# Patient Record
Sex: Female | Born: 1935 | Race: Black or African American | Hispanic: No | Smoking: Former smoker
Health system: Southern US, Community
[De-identification: ages and names within clinical notes are randomized; demographics above are authoritative.]

## PROBLEM LIST (undated history)

## (undated) DIAGNOSIS — I259 Chronic ischemic heart disease, unspecified: Secondary | ICD-10-CM

## (undated) DIAGNOSIS — C50919 Malignant neoplasm of unspecified site of unspecified female breast: Secondary | ICD-10-CM

## (undated) DIAGNOSIS — Z8639 Personal history of other endocrine, nutritional and metabolic disease: Secondary | ICD-10-CM

## (undated) DIAGNOSIS — I69328 Other speech and language deficits following cerebral infarction: Secondary | ICD-10-CM

## (undated) DIAGNOSIS — I2510 Atherosclerotic heart disease of native coronary artery without angina pectoris: Secondary | ICD-10-CM

## (undated) DIAGNOSIS — I10 Essential (primary) hypertension: Secondary | ICD-10-CM

## (undated) DIAGNOSIS — I219 Acute myocardial infarction, unspecified: Secondary | ICD-10-CM

## (undated) DIAGNOSIS — D649 Anemia, unspecified: Secondary | ICD-10-CM

## (undated) DIAGNOSIS — E039 Hypothyroidism, unspecified: Secondary | ICD-10-CM

## (undated) DIAGNOSIS — I639 Cerebral infarction, unspecified: Secondary | ICD-10-CM

## (undated) DIAGNOSIS — Z951 Presence of aortocoronary bypass graft: Secondary | ICD-10-CM

## (undated) DIAGNOSIS — M1990 Unspecified osteoarthritis, unspecified site: Secondary | ICD-10-CM

## (undated) DIAGNOSIS — E785 Hyperlipidemia, unspecified: Secondary | ICD-10-CM

## (undated) HISTORY — DX: Hyperlipidemia, unspecified: E78.5

## (undated) HISTORY — DX: Chronic ischemic heart disease, unspecified: I25.9

## (undated) HISTORY — PX: BREAST SURGERY: SHX581

## (undated) HISTORY — DX: Essential (primary) hypertension: I10

## (undated) HISTORY — DX: Unspecified osteoarthritis, unspecified site: M19.90

## (undated) HISTORY — DX: Malignant neoplasm of unspecified site of unspecified female breast: C50.919

## (undated) HISTORY — DX: Anemia, unspecified: D64.9

## (undated) HISTORY — DX: Hypothyroidism, unspecified: E03.9

## (undated) HISTORY — PX: TUBAL LIGATION: SHX77

## (undated) HISTORY — DX: Presence of aortocoronary bypass graft: Z95.1

---

## 1973-06-02 DIAGNOSIS — C50919 Malignant neoplasm of unspecified site of unspecified female breast: Secondary | ICD-10-CM

## 1973-06-02 HISTORY — PX: MASTECTOMY: SHX3

## 1973-06-02 HISTORY — DX: Malignant neoplasm of unspecified site of unspecified female breast: C50.919

## 1997-10-02 ENCOUNTER — Ambulatory Visit (HOSPITAL_COMMUNITY): Admission: RE | Admit: 1997-10-02 | Discharge: 1997-10-02 | Payer: Self-pay | Admitting: Family Medicine

## 1997-12-19 ENCOUNTER — Encounter: Admission: RE | Admit: 1997-12-19 | Discharge: 1998-03-19 | Payer: Self-pay | Admitting: Family Medicine

## 1999-06-20 ENCOUNTER — Encounter: Payer: Self-pay | Admitting: Family Medicine

## 1999-06-20 ENCOUNTER — Encounter: Admission: RE | Admit: 1999-06-20 | Discharge: 1999-06-20 | Payer: Self-pay | Admitting: Family Medicine

## 2000-12-31 HISTORY — PX: CORONARY ARTERY BYPASS GRAFT: SHX141

## 2001-01-05 ENCOUNTER — Encounter: Payer: Self-pay | Admitting: Family Medicine

## 2001-01-05 ENCOUNTER — Encounter: Admission: RE | Admit: 2001-01-05 | Discharge: 2001-01-05 | Payer: Self-pay | Admitting: Family Medicine

## 2001-01-19 ENCOUNTER — Inpatient Hospital Stay (HOSPITAL_COMMUNITY): Admission: EM | Admit: 2001-01-19 | Discharge: 2001-01-28 | Payer: Self-pay | Admitting: *Deleted

## 2001-01-20 HISTORY — PX: CARDIAC CATHETERIZATION: SHX172

## 2001-01-22 ENCOUNTER — Encounter: Payer: Self-pay | Admitting: Thoracic Surgery (Cardiothoracic Vascular Surgery)

## 2001-01-23 ENCOUNTER — Encounter: Payer: Self-pay | Admitting: Thoracic Surgery (Cardiothoracic Vascular Surgery)

## 2001-01-24 ENCOUNTER — Encounter: Payer: Self-pay | Admitting: Thoracic Surgery (Cardiothoracic Vascular Surgery)

## 2001-01-25 ENCOUNTER — Encounter: Payer: Self-pay | Admitting: Cardiothoracic Surgery

## 2001-01-31 ENCOUNTER — Emergency Department (HOSPITAL_COMMUNITY): Admission: EM | Admit: 2001-01-31 | Discharge: 2001-01-31 | Payer: Self-pay | Admitting: Emergency Medicine

## 2001-01-31 ENCOUNTER — Encounter: Payer: Self-pay | Admitting: Emergency Medicine

## 2001-03-13 ENCOUNTER — Emergency Department (HOSPITAL_COMMUNITY): Admission: EM | Admit: 2001-03-13 | Discharge: 2001-03-13 | Payer: Self-pay | Admitting: *Deleted

## 2001-03-16 ENCOUNTER — Encounter (HOSPITAL_COMMUNITY): Admission: RE | Admit: 2001-03-16 | Discharge: 2001-06-14 | Payer: Self-pay | Admitting: Cardiology

## 2001-06-15 ENCOUNTER — Encounter (HOSPITAL_COMMUNITY): Admission: RE | Admit: 2001-06-15 | Discharge: 2001-09-13 | Payer: Self-pay | Admitting: Cardiology

## 2001-08-18 ENCOUNTER — Encounter (HOSPITAL_BASED_OUTPATIENT_CLINIC_OR_DEPARTMENT_OTHER): Payer: Self-pay | Admitting: General Surgery

## 2001-08-18 ENCOUNTER — Ambulatory Visit (HOSPITAL_COMMUNITY): Admission: RE | Admit: 2001-08-18 | Discharge: 2001-08-18 | Payer: Self-pay | Admitting: General Surgery

## 2001-08-26 ENCOUNTER — Encounter (INDEPENDENT_AMBULATORY_CARE_PROVIDER_SITE_OTHER): Payer: Self-pay | Admitting: Specialist

## 2001-08-26 ENCOUNTER — Encounter (HOSPITAL_BASED_OUTPATIENT_CLINIC_OR_DEPARTMENT_OTHER): Payer: Self-pay | Admitting: General Surgery

## 2001-08-26 ENCOUNTER — Ambulatory Visit (HOSPITAL_COMMUNITY): Admission: RE | Admit: 2001-08-26 | Discharge: 2001-08-26 | Payer: Self-pay | Admitting: General Surgery

## 2002-05-23 ENCOUNTER — Encounter: Payer: Self-pay | Admitting: Family Medicine

## 2002-05-23 ENCOUNTER — Encounter: Admission: RE | Admit: 2002-05-23 | Discharge: 2002-05-23 | Payer: Self-pay | Admitting: Family Medicine

## 2002-08-22 ENCOUNTER — Ambulatory Visit (HOSPITAL_COMMUNITY): Admission: RE | Admit: 2002-08-22 | Discharge: 2002-08-22 | Payer: Self-pay | Admitting: Internal Medicine

## 2002-08-22 ENCOUNTER — Encounter (INDEPENDENT_AMBULATORY_CARE_PROVIDER_SITE_OTHER): Payer: Self-pay

## 2002-08-22 ENCOUNTER — Encounter (INDEPENDENT_AMBULATORY_CARE_PROVIDER_SITE_OTHER): Payer: Self-pay | Admitting: Specialist

## 2002-08-22 ENCOUNTER — Encounter: Payer: Self-pay | Admitting: Internal Medicine

## 2002-12-13 ENCOUNTER — Ambulatory Visit (HOSPITAL_COMMUNITY): Admission: RE | Admit: 2002-12-13 | Discharge: 2002-12-13 | Payer: Self-pay | Admitting: Otolaryngology

## 2002-12-13 ENCOUNTER — Encounter: Payer: Self-pay | Admitting: Otolaryngology

## 2003-09-16 ENCOUNTER — Emergency Department (HOSPITAL_COMMUNITY): Admission: EM | Admit: 2003-09-16 | Discharge: 2003-09-16 | Payer: Self-pay | Admitting: Emergency Medicine

## 2005-06-03 ENCOUNTER — Ambulatory Visit (HOSPITAL_COMMUNITY): Admission: RE | Admit: 2005-06-03 | Discharge: 2005-06-03 | Payer: Self-pay | Admitting: Internal Medicine

## 2005-07-18 ENCOUNTER — Emergency Department (HOSPITAL_COMMUNITY): Admission: EM | Admit: 2005-07-18 | Discharge: 2005-07-18 | Payer: Self-pay | Admitting: Emergency Medicine

## 2006-01-01 ENCOUNTER — Emergency Department (HOSPITAL_COMMUNITY): Admission: EM | Admit: 2006-01-01 | Discharge: 2006-01-01 | Payer: Self-pay | Admitting: Emergency Medicine

## 2006-01-15 ENCOUNTER — Encounter: Admission: RE | Admit: 2006-01-15 | Discharge: 2006-01-29 | Payer: Self-pay | Admitting: Family Medicine

## 2008-12-31 HISTORY — PX: CARDIOVASCULAR STRESS TEST: SHX262

## 2009-04-03 ENCOUNTER — Encounter: Admission: RE | Admit: 2009-04-03 | Discharge: 2009-04-03 | Payer: Self-pay | Admitting: Family Medicine

## 2009-11-17 ENCOUNTER — Emergency Department (HOSPITAL_COMMUNITY)
Admission: EM | Admit: 2009-11-17 | Discharge: 2009-11-18 | Payer: Self-pay | Source: Home / Self Care | Admitting: Emergency Medicine

## 2010-05-28 ENCOUNTER — Ambulatory Visit: Payer: Self-pay | Admitting: Cardiology

## 2010-06-23 ENCOUNTER — Emergency Department (HOSPITAL_COMMUNITY)
Admission: EM | Admit: 2010-06-23 | Discharge: 2010-06-23 | Payer: Self-pay | Source: Home / Self Care | Admitting: Emergency Medicine

## 2010-06-25 LAB — DIFFERENTIAL
Basophils Relative: 0 % (ref 0–1)
Eosinophils Relative: 4 % (ref 0–5)
Neutro Abs: 2.4 10*3/uL (ref 1.7–7.7)
Neutrophils Relative %: 63 % (ref 43–77)

## 2010-06-25 LAB — CBC
HCT: 32 % — ABNORMAL LOW (ref 36.0–46.0)
Hemoglobin: 10.3 g/dL — ABNORMAL LOW (ref 12.0–15.0)
MCH: 27.2 pg (ref 26.0–34.0)
WBC: 3.8 10*3/uL — ABNORMAL LOW (ref 4.0–10.5)

## 2010-06-25 LAB — COMPREHENSIVE METABOLIC PANEL
ALT: 14 U/L (ref 0–35)
AST: 20 U/L (ref 0–37)
CO2: 27 mEq/L (ref 19–32)
Calcium: 9.4 mg/dL (ref 8.4–10.5)
Chloride: 108 mEq/L (ref 96–112)
GFR calc Af Amer: 59 mL/min — ABNORMAL LOW (ref >60)
GFR calc non Af Amer: 48 mL/min — ABNORMAL LOW (ref >60)
Glucose, Bld: 94 mg/dL (ref 70–99)
Potassium: 3.8 mEq/L (ref 3.5–5.1)
Sodium: 140 mEq/L (ref 135–145)
Total Protein: 6.6 g/dL (ref 6.0–8.3)

## 2010-06-25 LAB — POCT CARDIAC MARKERS: Troponin i, poc: <0.05 ng/mL (ref 0.00–0.09)

## 2010-07-01 NOTE — Consult Note (Signed)
NAMEKENNDRA, MORRIS                  ACCOUNT NO.:  [id number]  MEDICAL RECORD NO.:  [id number]           PATIENT TYPE:  LOCATION:                                 FACILITY:  PHYSICIAN:  Michele Kerlin C. Melondy Blanchard, MD, FACCDATE OF BIRTH:  24-Jun-1924  DATE OF CONSULTATION: DATE OF DISCHARGE:                                CONSULTATION   I was asked by Dr. Doug Sou of the emergency department to evaluate Ms. Josephine Igo for chest discomfort.  HISTORY OF PRESENT ILLNESS:  Ms. Kamaka is a delightful 77 year old widowed African American female who comes in today after having some sharp knife- like pains in her subxiphoid area radiating under her left breast.  It lasted for a few seconds.  She was just sitting in the chair.  It subsequently returned and lasted again for a few minutes.  It continued to happen to her off and on and she called 911.  They gave her sublingual nitroglycerin and aspirin.  By the time she arrived to the ED, the pain had resolved.  She is currently having off and on discomfort as well.  She has known coronary artery disease and initially presented in 2002 with sudden onset of shortness of breath.  She did not have angina at that time.  She did have severe three-vessel disease and underwent coronary artery bypass grafting by Dr. Dorris Fetch in August 2002 with 5 grafts.  She had good LV function.  She has done well ever since.  She is followed by Dr. Roger Shelter.  She had a recent checkup with Dr. Deborah Chalk who felt that she was doing well.  I do not get the impression that she had a recent stress test.  The discomfort above was not associated with any nausea, vomiting, shortness of breath, or dyspnea.  She has had some burping, but no significant reflux symptoms.  Her bowels have been moving regularly. She has had no melena or hematochezia.  In the emergency department, she was found to be stable.  Her EKG both in the hospital as well as here in the emergency  department showed T- wave inversion in V1 and V2 which are old.  Her ST segments are not remarkable, in fact it looked little bit better than her EKG did in 2007.  Chest x-ray shows her to be status post coronary artery bypass grafting. There was no sign of any infection or consolidations.  She does have some cardiomegaly.  Laboratory data showed point-of-care markers to be negative.  Her hemoglobin is 10.3, white count 3.8, and platelets 138,000.  Metabolic panel was remarkable for BUN of 25, otherwise unremarkable.  Her past medical history significant for intolerance to VALIUM, CODEINE, and OXYCODONE.  She has a history of hypertension, breast cancer, osteoarthritis, and history of hypokalemia.  FAMILY HISTORY:  Significant for Alzheimer's and no premature coronary artery disease.  SOCIAL HISTORY:  She lives in [place].  She has 4 children, all of which have passed away.  She is a widow.  She quit smoking about 25 years ago.  She is retired from the school system.  REVIEW OF  SYSTEMS:  Other than the history of present illness, she has a history of chronic belching.  PHYSICAL EXAMINATION IN THE EMERGENCY ROOM:  GENERAL:  She is a delightful lady, in no acute distress. VITAL SIGNS:  Her blood pressure is 120/80.  Pulse is 70 and regular. She is in sinus rhythm.  Sat was 98%.  Respiratory rate is 18. HEENT:  Remarkable for glasses and dentures.  Carotid upstrokes are equal bilaterally without bruits. NECK:  No JVD.  Supple.  Thyroid is not enlarged. CHEST:  No chest Ashkan Chamberland tenderness.  There is no epigastric or left to right upper quadrant tenderness. CARDIAC:  Soft S1 and S2.  Regular rate and rhythm.  Normal S1 and S2. No rub. LUNGS:  Clear. ABDOMEN:  Soft, otherwise good bowel sounds. EXTREMITIES:  No cyanosis, clubbing, or edema.  There is no sign of DVT. Pulses are intact. NEURO:  Intact. SKIN:  Unremarkable.  ASSESSMENT:  Noncardiac chest pain.  The patient  thinks that there may be some arthritic pain and she may be right.  I told her to take p.r.n. Aleve which she does at home.  No other changes were made.  She was discharged to the emergency room.     Shyonna Carlin C. Daleen Squibb, MD, [facility]     TCW/MEDQ  D:  06/23/2010  T:  06/24/2010  Job:  [id number]  Electronically Signed by Valera Castle MD [facility] on 07/01/2010 11:00:44 AM

## 2010-08-18 LAB — DIFFERENTIAL
Eosinophils Absolute: 0 10*3/uL (ref 0.0–0.7)
Eosinophils Relative: 0 % (ref 0–5)
Lymphs Abs: 0.7 10*3/uL (ref 0.7–4.0)
Monocytes Absolute: 0.3 10*3/uL (ref 0.1–1.0)
Neutro Abs: 9.6 10*3/uL — ABNORMAL HIGH (ref 1.7–7.7)
Neutrophils Relative %: 90 % — ABNORMAL HIGH (ref 43–77)

## 2010-08-18 LAB — CBC
HCT: 33.9 % — ABNORMAL LOW (ref 36.0–46.0)
MCHC: 33.9 g/dL (ref 30.0–36.0)
MCV: 84.1 fL (ref 78.0–100.0)
Platelets: 156 10*3/uL (ref 150–400)
RDW: 14.4 % (ref 11.5–15.5)
WBC: 10.6 10*3/uL — ABNORMAL HIGH (ref 4.0–10.5)

## 2010-08-18 LAB — COMPREHENSIVE METABOLIC PANEL
Albumin: 4 g/dL (ref 3.5–5.2)
CO2: 27 mEq/L (ref 19–32)
GFR calc Af Amer: 53 mL/min — ABNORMAL LOW (ref >60)
Glucose, Bld: 109 mg/dL — ABNORMAL HIGH (ref 70–99)
Sodium: 135 mEq/L (ref 135–145)

## 2010-10-03 ENCOUNTER — Telehealth: Payer: Self-pay | Admitting: Cardiology

## 2010-10-03 NOTE — Telephone Encounter (Signed)
Returning your phone call about the new doctor referral. Is a  Lawyer and will be home after two tomorrow. Please call back.

## 2010-10-03 NOTE — Telephone Encounter (Signed)
RN set pt up to see Norma Fredrickson NP on 11/19/10 with labs.

## 2010-10-18 NOTE — Consult Note (Signed)
[place]. [facility]  Patient:    Natalie Bullock, Natalie Bullock Visit Number: [id number] MRN: [id number]          Service Type: MED Location: [phone] Attending Physician:  Eleanora Neighbor Adm. Date:  01/19/2001   CC:         Colleen Can. Deborah Chalk, M.D.  Charlynne Pander Bruna Potter, M.D.   Consultation Report  REASON FOR CONSULTATION:  Three-vessel coronary disease.  CHIEF COMPLAINT:  Shortness of breath.  HISTORY OF PRESENT ILLNESS:  Natalie Bullock is a 77 year old, African-American female with a history of hypertension but no prior cardiac disease. Over the past couple of months, she has been experiencing dyspnea which is not necessarily related to exertion. She would be short of breath. She says that these symptoms occasionally improved when she was doing her rehab from her radical mastectomy. On the morning of January 19, 2001, she presented to the emergency room after being awakened at 4 a.m. with severe shortness of breath. She did not experience any indigestion, chest pain, pressure, or tightness. She was evaluated in the emergency room and had elevated cardiac enzymes. Her peak CK was 183 and peak MB was 13. Her troponin was 1.42. She was admitted, treated with heparin and nitroglycerin, and underwent cardiac catheterization. At catheterization, she had normal left ventricular function. She had severe, diffusely diseased right coronary with 60% to 70% stenosis, as well as an ostial 70% posterior descending stenosis. She had a 99% LAD lesion with aneurysmal dilatation. A small first diagonal branch had a 90% stenosis. She also had a 99% stenosis in the second OM. Her circumflex system was small with multiple, small, tortuous obtuse marginal branches. She now is referred for consideration for coronary artery bypass grafting.  PAST MEDICAL HISTORY/PAST SURGICAL HISTORY:  Significant for hypertension, osteoarthritis, history of breast cancer, status post right mastectomy in  1975 with no recurrence, history of sinusitis, and history of hyperkalemia. She denies previous cardiac disease, diabetes, or stroke.  MEDICATIONS AT THE TIME OF ADMISSION: 1. Enteric-coated aspirin 81 mg p.o. q.d. 2. Maxzide 25 mg p.o. q.d. 3. Ibuprofen p.r.n.  ALLERGIES:  She is allergic to VALIUM and TYLENOL.  FAMILY HISTORY:  Significant for her grandfather who had a myocardial infarction, as well as an uncle on her mothers side who had a myocardial infarction.  SOCIAL HISTORY:  She is widowed. She lives alone. She has multiple family members in the area. She has a remote history of tobacco use, 10-pack years; she quit 20 years ago. She does not use ethanol.  REVIEW OF SYSTEMS:  She has shortness of breath as mentioned which is occasionally exertional and occasionally at rest. She does not have any chest pain. She has had frequent bloating and belching but no real indigestion pain. She has had no other change in her bowel habits. She has not had any change in her bladder habits. She has no history of DVT or PE. She does get some swelling in her left ankle from time to time ever since having a venous cutdown years ago. She does have osteoarthritis pain, particularly in her left knee. Her review of systems is otherwise negative.  PHYSICAL EXAMINATION:  GENERAL:  Natalie Bullock is an obese, 77 year old African-American female in no acute distress. She is well-developed and well-nourished.  VITAL SIGNS:  Blood pressure is 124/65, pulse is 88, respirations are 18, her temperature is 97.4, and her oxygen saturation is 98% on room air.  NEUROLOGICAL:  She is alert and  oriented x 3 and grossly intact.  HEENT:  Unremarkable.  NECK:  Supple without thyromegaly, adenopathy, or bruits.  LUNGS:  Clear to auscultation and percussion bilaterally.  CHEST:  Remarkable for multiple surgical scars from a previous radical mastectomy. All well healed with no evidence of recurrence.  CARDIAC:   Regular rate and rhythm. Normal S1 and S2. There are no rubs or murmurs.  ABDOMEN:  Soft and nontender.  EXTREMITIES:  There is trace edema in the left lower extremity. There is a well-healed surgical scar at the left ankle. She has palpable pulses that are 2+ throughout.  SKIN:  Intact with no lesions other than previously mentioned surgical scars.  LABORATORY DATA:  Her white blood cell count is 6.8, hematocrit 32, platelets 170. PT 12.9, PTT 62 on heparin. Her sodium 138, potassium 4.1, BUN and creatinine 8 and 0.9, glucose is 124.  Cardiac catheterization as noted in the HPI.  Her EKG shows normal sinus rhythm with left axis deviation.  Chest x-ray shows COPD and cardiomegaly and no acute or active disease.  IMPRESSION AND PLAN:  Natalie Bullock is a 77 year old, African-American female who presented with acute shortness of breath. She ruled in for a non-Q-wave myocardial infarction; and at cardiac catheterization, has severe three-vessel disease. Coronary artery bypass grafting is indicated for relief of symptoms and survival benefit. The patient has good targets in the right system, as well as the LAD. She has a small diagonal that does appear to be graftable. She has a very small circumflex system. It is unclear if that will be graftable or not from the appearance of the cardiac catheterization. In any event, she would definitely benefit from grafting of the LAD and right coronary systems.  I have discussed with Natalie Bullock the indications, risks, benefits, and alternative treatments. She understands that the risks include but are limited to death, stroke, MI, bleeding, possible need for blood transfusion, infection, DVT, PE, other organ system dysfunction including respiratory, renal, hepatic, and GI. She currently is symptom free in stable condition on a  heparin drip and nitroglycerin paste. She will be on the OR schedule for Friday as the first case. All questions were  answered. Attending Physician:  Eleanora Neighbor DD:  01/20/01 TD:  01/21/01 Job: [id number]

## 2010-10-18 NOTE — Op Note (Signed)
[place]. [facility]  Patient:    Natalie Bullock, Natalie Bullock Visit Number: [id number] MRN: [id number]          Service Type: MED Location: 2000 2006 01 Attending Physician:  Charlett Lango Proc. Date: 01/22/01 Adm. Date:  01/19/2001   CC:         Colleen Can. Deborah Chalk, M.D.  Charlynne Pander Bruna Potter, M.D.   Operative Report  PREOPERATIVE DIAGNOSIS:  Severe three-vessel disease, status post non-Q-wave myocardial infarction.  POSTOPERATIVE DIAGNOSIS:  Severe three-vessel disease, status post non-Q-wave myocardial infarction.  PROCEDURES:  Median sternotomy, extracorporeal circulation, coronary artery bypass grafting x 5 (left internal mammary artery to the left anterior descending coronary artery, saphenous vein graft to first diagonal, saphenous vein graft to ramus intermedius, sequential saphenous vein graft to posterior descending and posterolateral).  SURGEON:  Salvatore Decent. Dorris Fetch, M.D.  ASSISTANTMaxwell Marion, R.N.F.A.  ANESTHESIA:  General.  FINDINGS:  Severe three-vessel coronary disease with heavily calcified proximal coronaries.  LAD, posterior descending, and posterolateral good quality; first diagonal small, fair quality; ramus intermedius large, fair quality, diffusely diseased.  Left internal mammary artery good quality, saphenous vein fair.  CLINICAL NOTE:  Natalie Bullock is a 77 year old female with no prior history of coronary disease.  She presented with acute shortness of breath which awakened her from her sleep.  Her cardiac enzymes were positive for a non-Q-wave MI. She underwent cardiac catheterization, which revealed severe three-vessel disease and good left ventricular function.  She had heavily calcified proximal coronaries, not amenable to percutaneous intervention; therefore, she was referred for coronary artery bypass grafting.  The indications, risks, benefits, and alternative procedures were discussed in detail with the patient.  She  understood and accepted the risks and agreed to proceed.  DESCRIPTION OF PROCEDURE:  Natalie Bullock was brought to the preop holding area on January 22, 2001.  Lines were placed to monitor arterial, central venous, and pulmonary arterial pressure.  EKG leads were placed for continuous telemetry. The patient was taken to the operating room, anesthetized and intubated.  A Foley catheter was placed, intravenous antibiotics were administered.  The chest, abdomen, and legs were prepped and draped in the usual fashion.  A median sternotomy was performed.  Simultaneously an incision was made in the medial aspect of the right leg, and the greater saphenous vein was harvested using standard technique.  Saphenous vein was of fair quality.  The mammary artery was of good quality.  It was harvested using standard techniques.  The patient was fully heparinized prior to dividing the distal end of the mammary artery.  There was good flow through the cut end of the vessel.  The mammary was placed in a papaverine-soaked sponge and placed into the left pleural space.  The pericardium was opened, and the ascending aorta was inspected and palpated.  There was no palpable atherosclerotic disease.  The aorta was cannulated via concentric 2-0 Ethibond pledgeted pursestring sutures.  A dual-stage venous cannula was placed via pursestring suture in the right atrial appendage.  Cardiopulmonary bypass was instituted, and the patient was cooled to 32 degrees Celsius.  The coronary arteries were inspected, and anastomotic sites were chosen.  The conduits were inspected and cut to length. A foam pad was placed in the pericardium to protect the left phrenic nerve, a temperature probe was placed in the myocardial septum, and a cardioplegia cannula was placed in the ascending aorta.  The aorta was crossclamped, and the left ventricle was emptied via the aortic root  vent.  Cardiac arrest then was achieved with a combination of  cold antegrade blood cardioplegia and topical iced saline.  After achieving a complete diastolic arrest and a myocardial septal temperature of 12 degrees Celsius, the following distal anastomoses were performed.  First a reversed saphenous vein graft was placed sequentially to the posterior descending and posterolateral branches of the right coronary.  The right coronary itself was heavily diseased and heavily calcified.  There also was a tight stenosis of the proximal aspect of the posterior descending.  Both target vessels were 2 mm diameter, good-quality vessels.  The vein graft was of fair quality.  A side-to-side anastomosis was performed about the side branch to the posterior descending and end-to-side to the posterolateral. Both anastomoses were performed with running 7-0 Prolene sutures.  There was excellent flow through the graft.  Cardioplegia was administered.  A good flash was seen distally in both vessels.  There was good hemostasis of both anastomoses.  Next a reversed saphenous vein graft was placed end-to-side to the ramus intermedius.  This was a large, 2 mm vessel but was heavily diseased.  A 1.5 mm probe passed a short distance in both directions.  The vessel was grafted in its midportion at essentially the only location where there was not significant plaque.  The anastomosis was performed with a running 7-0 Prolene suture.  At the completion of the anastomosis, cardioplegia was administered. There was good flow and good hemostasis.  Next a reversed saphenous vein graft was placed to the first diagonal branch of the LAD.  This was a small vessel.  It was about 1.2 mm in diameter.  It was of fair quality.  It had a 95% proximal stenosis.  The anastomosis was performed with a running 7-0 Prolene suture in an end-to-side fashion.  There was good flow through this graft.  Cardioplegia was administered, and again there was good hemostasis.   Next, the left internal  mammary artery was brought through a window in the pericardium.  The distal end was spatulated and was anastomosed end-to-side to the distal LAD.  The LAD was a 1.5 mm good-quality target.  The mammary was a 2 mm good-quality conduit.  At the completion of the mammary to LAD anastomosis, rewarming was begun.  The bulldog clamp was removed from the mammary artery.  Immediate and rapid septal rewarming was noted.  Lidocaine was administered.  The mammary pedicle was tacked to the epicardial surface of the heart with 6-0 Prolene sutures.  The aortic crossclamp was removed.  The total crossclamp time was 54 minutes.  A partial occlusion clamp was placed on the ascending aorta.  The cardioplegia cannula was removed.  The proximal vein graft anastomoses were performed to 4.0 mm punch aortotomies with running 6-0 Prolene sutures.  At the completion of the final proximal anastomosis, the patient was placed in Trendelenburg position, air was allowed to vent as the partial clamp was removed.  Air was then aspirated from the vein grafts as the bulldog clamps were removed and flow was restored through all grafts.  All proximal and distal anastomoses were inspected for hemostasis.  Epicardial pacing wires were placed on the right ventricle and right atrium.  The patient was atrially paced for rate and weaned from cardiopulmonary bypass without inotropic support.  Total bypass time was 115 minutes.  The initial cardiac index was greater than 2 L/m. per sq. m.  The patient remained hemodynamically stable throughout the postbypass period.  A test dose of  protamine was administered and was well-tolerated.  The atrial and aortic cannulae were removed.  The remainder of the protamine was administered without incident.  The chest was irrigated with 1 L of warm normal saline containing 1 g of vancomycin.  Hemostasis was achieved.  The pericardium was reapproximated with interrupted 3-0 silk sutures.  It  came together easily without tension on the underlying grafts or the heart.  The left pleural and two mediastinal chest tubes were placed through separate subcostal incisions.  The sternum was closed with interrupted heavy-gauge double stainless steel wires.  The pectoralis fascia was closed with a running #1 Vicryl suture.  The subcutaneous tissue was closed with a running 2-0 Vicryl suture, and the skin was closed with a 3-0 Vicryl subcuticular suture. The leg incision was closed in a similar fashion in the skin and subcutaneous tissue.  All sponge, needle, and instrument counts were correct at the end of the procedure.  There were no intraoperative complications.  The patient was taken from the operating room to the surgical intensive care unit intubated, in stable condition.  NOTE:  The second obtuse marginal was a small, less than 1 mm, tortuous vessel that was not graftable. Attending Physician:  Charlett Lango DD:  01/22/01 TD:  01/25/01 Job: [id number]

## 2010-10-18 NOTE — Cardiovascular Report (Signed)
[place]. [facility]  Patient:    Natalie Bullock, Natalie Bullock Visit Number: [id number] MRN: [id number]          Service Type: MED Location: [phone] Attending Physician:  Eleanora Neighbor Proc. Date: 01/20/01 Adm. Date:  01/19/2001   CC:         Charlynne Pander. Bruna Potter, M.D.  Hilario Quarry, M.D.   Cardiac Catheterization  INDICATIONS:  The patient presented with chest pain and had positive enzymes. She has had progressive dyspnea over the last two months.  She is admitted. She has had a history of a right mastectomy in 1995.  She has had long-standing hypertension.  PROCEDURE:  Left heart catheterization with selective coronary angiography, left ventricular angiography, and attempted Perclose that was unsuccessful.  TYPE AND SITE OF ENTRY:  Percutaneous right femoral artery.  CATHETERS:  A 6 French 4 curved Judkins right and left coronary catheters, 6 French pigtail ventriculographic catheter.  CONTRAST MATERIAL:  Omnipaque.  MEDICATIONS GIVEN PRIOR TO THE PROCEDURE:  Benadryl.  MEDICATIONS GIVEN DURING THE PROCEDURE:  Fentanyl 50 mcg IV, Zofran 2 mg IV, atropine 0.5 mg IV.  COMMENTS:  The patient tolerated the procedure well.  Problems were encountered with attempted Perclose and there was considerable pain with attempting to close the artery and the suture was unsuccessful.  HEMODYNAMIC DATA:  The aortic pressure was 141/77, LV was 139/15.  There was no aortic valve gradient noted on pullback.  ANGIOGRAPHIC DATA: 1. Right coronary artery:  The right coronary artery is a large dominant    vessel that gives left to right collaterals.  There is diffuse    atherosclerosis throughout the right coronary artery but the posterior    descending and posterolateral branch would appear to be suitable for bypass    grafting.  There is a bead like atherosclerosis throughout the main body    of the right coronary artery.  There probably is no greater than 70%  focal    narrowing at any one point in the main portion of the right coronary    artery.  There is a 70% ostial posterior descending stenosis.  There is    collaterals to the distal left circumflex and appears to predominately    involve diffusely small vessels.  There clearly are some collaterals to the    distal left anterior descending but also appears to be some to the distal    left circumflex. 2. Left main:  The left main coronary artery has mild irregularity. 3. Left anterior descending:  The left anterior descending is heavily    calcified.  There is a 99% stenosis before and after an aneurysmal    dilatation.  In this aneurysmal dilatation, a septal perforating branch    rises as well as a diagonal vessel.  The diagonal has a 70-90% stenosis    proximally.  Both the diagonal and the left anterior descending would    be suitable for bypass grafting but there is calcification in both of    these vessels. 4. Left circumflex:  The left circumflex is highly tortuous.  There are three    small marginal vessels and equivocal intermediate vessel that perhaps    fills from collaterals from the right but is a diffuse wispy-type vessel.    In the second marginal, there is a 99% focal stenosis.  This is a small    2 mm vessel but may be suitable for grafting.  The first marginal and third  marginal do not appear to have significant disease and the proximal left    circumflex is somewhat diffusely narrowed.  There is one very small    proximal either marginal or intermediate that has a diffuse disease as    mentioned above.  LEFT VENTRICULOGRAPHY:  Left ventricular angiogram is performed in the RAO position.  Overall cardiac size and silhouette are normal.  Left ventricular function is normal with a global ejection fraction of 60-70%.  There is no mitral regurgitation, intracardiac calcification or intracavitary filling defect.  OVERALL IMPRESSION: 1. Well preserved left ventricular  function. 2. Severe three-vessel coronary artery disease (diffuse severe right coronary    artery stenosis of approximately 70%), 99% complex proximal left anterior    descending disease, tortuous left circumflex disease with a 90+ percent    stenosis in the small second obtuse marginal but diffuse disease in the    proximal branches filling by collaterals.  DISCUSSION:  It would appear that the patient needs coronary artery bypass grafting. Attending Physician:  Eleanora Neighbor DD:  01/20/01 TD:  01/20/01 Job: [id number]

## 2010-10-18 NOTE — H&P (Signed)
[place]. [facility]  Patient:    Natalie Bullock, Natalie Bullock Visit Number: [id number] MRN: [id number]          Service Type: MED Location: 1800 1827 [id number] Attending Physician:  Corlis Leak. Dictated by:   Jennet Maduro Earl Gala, R.N., A.N.P. Adm. Date:  01/19/2001   CC:         Charlynne Pander. Bruna Potter, M.D.   History and Physical  CHIEF COMPLAINT: Shortness of breath.  HISTORY OF PRESENT ILLNESS: Ms. Natalie Bullock is a 77 year old African-American female, who has long-standing hypertension.  She presents to the emergency room this morning with progressive dyspnea that has basically been occurring over the past two months.  Initially it would improve after doing her regular routine exercise program, but over the last ten days it has become progressively worse and is now more exertional in nature, and she actually awoke at 4 a.m. this morning with acute dyspnea.  She has had no cough.  There has been no chest pain.  She has had questionable fluid retention/edema.  Here in the emergency room, she was noted to have increased cardiac enzymes.  She is subsequently admitted to the cardiac service for further evaluation.  PAST MEDICAL HISTORY:  1. Hypertension.  2. Osteoarthritis.  3. History of breast cancer, status post right mastectomy in 1975.  4. History of sinusitis.  5. History of hypokalemia.  There is no report of diabetes, stroke, or heart attack.  ALLERGIES:  1. VALIUM.  2. TYLENOL #3.  CURRENT MEDICATIONS:  1. Aspirin.  2. Maxzide.  FAMILY HISTORY: Her mother had Alzheimers, but she is unsure as to the actual cause of death.  Her father died from a possible bowel-related illness.  SOCIAL HISTORY: She is a widow.  She has had four children, all of which are deceased.  She is a remote smoker, none for the past 15 years.  She is retired from the school system.  REVIEW OF SYSTEMS: Otherwise as stated above.  She has had chronic belching, but no real indigestion.  No  previous chest pain.  No platelets.  No abdominal pain, constipation, or diarrhea.  There has been no recent fever or flu illness.  PHYSICAL EXAMINATION:  GENERAL: She is in no acute distress.  She is quite conversant.  VITAL SIGNS:  Blood pressure 154/85, heart rate in the 70s, respirations 18.  HEENT: Basically unremarkable.  She is edentulous at this point in time.  SKIN: Warm and dry.  Color unremarkable.  LUNGS: Coarse to auscultation posteriorly.  CARDIAC: Regular rhythm.  ABDOMEN: Obese, soft, positive bowel sounds, nontender.  EXTREMITIES: Full bilaterally.  LABORATORY DATA: First cardiac enzymes shows a total of 187 with an MB fraction of 8.1.  Troponin is elevated at 0.15.  Hematocrit 35, WBC 4.0. Chemistries reveal a sodium of 142, potassium 3.4, chloride 107, BUN 15, creatinine 0.6, glucose 124.  A 12 lead electrocardiogram shows normal sinus rhythm with nonspecific changes.  Chest x-ray reported to show cardiomegaly as well as COPD.  OVERALL IMPRESSION:  1. Dyspnea.  2. Positive cardiac enzymes, questionable myocardial infarction.  3. Hypertension.  PLAN: She will admitted and will followed serial enzymes.  Will add topical nitrates as well as IV heparin.  Will check a 2D echocardiogram.  May need to proceed on with cardiac catheterization as part of her evaluation. Dictated by:   Jennet Maduro Earl Gala, R.N., A.N.P. Attending Physician:  Corlis Leak DD:  01/19/01 TD:  01/19/01 Job: [id number]

## 2010-10-18 NOTE — Discharge Summary (Signed)
[place]. [facility]  Patient:    Natalie Bullock, Natalie Bullock Visit Number: [id number] MRN: [id number]          Service Type: MED Location: 2000 2006 01 Attending Physician:  Charlett Lango Dictated by:   Tollie Pizza. Collins, P.A. Adm. Date:  01/19/2001 Disc. Date: 01/28/01   CC:         Charlynne Pander. Bruna Potter, M.D.  Colleen Can. Deborah Chalk, M.D.   Discharge Summary  PRIMARY ADMITTING DIAGNOSES: 1. Non Q-wave myocardial infarction. 2. Severe three vessel coronary artery disease.  ADDITIONAL DIAGNOSES: 1. Hypertension. 2. History of breast cancer. 3. Osteoarthritis. 4. History of hypokalemia.  PROCEDURE: 1. Cardiac catheterization. 2. Coronary artery bypass grafting x 5 (left internal mammary artery to the    left anterior descending, saphenous vein graft to first diagonal, saphenous    vein graft to ramus intermedius, saphenous vein graft sequentially to    posterior descending artery and posterolateral artery).  HISTORY:  Patient is a 77 year old black female with a history of hypertension, but no known history of coronary artery disease.  She reports that she has had intermittent dyspnea, not necessarily on exertion, which has been going on for several months now.  On the day of this admission she awoke with severe shortness of breath and was taken to the ER for further evaluation.  Of note, she did not experience any indigestion, chest pain, pressure, or tightness.  When she was seen in the ER she was noted to have elevated cardiac enzymes with peak CK of 183 and a peak MB of 113.  Her troponin was 1.42.  She was admitted with diagnosis of non Q-wave myocardial infarction and was started on nitroglycerin and heparin.  HOSPITAL COURSE:  She was seen in consultation by cardiology and underwent cardiac catheterization on January 20, 2001.  She was found to have significant three vessel coronary artery disease which was felt to be amenable to  surgical revascularization.  A cardiothoracic surgery consultation was obtained and it was arranged for her to proceed with surgery at this time.  She was taken to the operating room on January 22, 2001 where she underwent CABG x 5 with the above noted grafts.  She tolerated the procedure well, was transferred from the operating room to the SICU in stable condition.  She was extubated shortly after surgery and was hemodynamically stable on postoperative day #1. Postoperative day #2 she was able to be transferred to the floor.  Since that time she has had an uneventful postoperative course.  She has been weaned off supplemental oxygen.  Her incisions are healing well.  She has been ambulating with cardiac rehabilitation phase I and is able to ambulate to 430 feet without difficulty.  She has maintained O2 saturations around 95% on room air. She has been afebrile and all vital signs have been stable.  She is back down below her preoperative weight at this time.  It is felt if she remains stable she may be ready for discharge on the morning of January 28, 2001.  DISCHARGE MEDICATIONS: 1. Aspirin 81 mg q.d. 2. Maxzide 25 mg q.d. 3. Atenolol 25 mg q.d. 4. Ultram 50-100 mg q.4h. p.r.n. for pain.  DISCHARGE INSTRUCTIONS:  She is asked to refrain from driving or lifting anything heavier than 10 pounds.  She should shower daily and clean her incisions with soap and water.  She is asked to call our office if she experiences any redness, swelling, drainage of the incision sites or  fever greater than 101.  She should continue a low fat, low sodium diet.  DISCHARGE FOLLOW-UP:  She is asked to call for an appointment with Dr. Deborah Chalk in the next two weeks and have a chest x-ray performed at that time.  She will see Dr. Dorris Fetch in followup on Wednesday, September 18 at 9:30 a.m.  She is asked to call our office if she experiences any problems in the interim. Dictated by:   Tollie Pizza Thomasena Edis,  P.A. Attending Physician:  Charlett Lango DD:  01/27/01 TD:  01/27/01 Job: [id number]

## 2010-11-13 ENCOUNTER — Encounter: Payer: Self-pay | Admitting: Nurse Practitioner

## 2010-11-18 ENCOUNTER — Other Ambulatory Visit: Payer: Self-pay | Admitting: *Deleted

## 2010-11-18 DIAGNOSIS — E785 Hyperlipidemia, unspecified: Secondary | ICD-10-CM

## 2010-11-19 ENCOUNTER — Encounter: Payer: Self-pay | Admitting: Nurse Practitioner

## 2010-11-19 ENCOUNTER — Other Ambulatory Visit (INDEPENDENT_AMBULATORY_CARE_PROVIDER_SITE_OTHER): Payer: Medicare Other | Admitting: *Deleted

## 2010-11-19 ENCOUNTER — Ambulatory Visit (INDEPENDENT_AMBULATORY_CARE_PROVIDER_SITE_OTHER): Payer: Medicare Other | Admitting: Nurse Practitioner

## 2010-11-19 VITALS — BP 130/70 | HR 66 | Ht 65.0 in | Wt 153.0 lb

## 2010-11-19 DIAGNOSIS — E785 Hyperlipidemia, unspecified: Secondary | ICD-10-CM

## 2010-11-19 DIAGNOSIS — I2510 Atherosclerotic heart disease of native coronary artery without angina pectoris: Secondary | ICD-10-CM

## 2010-11-19 DIAGNOSIS — Z951 Presence of aortocoronary bypass graft: Secondary | ICD-10-CM | POA: Insufficient documentation

## 2010-11-19 LAB — HEPATIC FUNCTION PANEL
ALT: 14 U/L (ref 0–35)
AST: 18 U/L (ref 0–37)
Albumin: 4.1 g/dL (ref 3.5–5.2)
Alkaline Phosphatase: 47 U/L (ref 39–117)
Bilirubin, Direct: 0.1 mg/dL (ref 0.0–0.3)
Total Bilirubin: 0.4 mg/dL (ref 0.3–1.2)
Total Protein: 7.2 g/dL (ref 6.0–8.3)

## 2010-11-19 LAB — BASIC METABOLIC PANEL
BUN: 37 mg/dL — ABNORMAL HIGH (ref 6–23)
CO2: 29 mEq/L (ref 19–32)
Calcium: 9.5 mg/dL (ref 8.4–10.5)
Chloride: 106 mEq/L (ref 96–112)
Creatinine, Ser: 1.2 mg/dL (ref 0.4–1.2)
GFR: 53.54 mL/min — ABNORMAL LOW (ref >60.00)
Glucose, Bld: 81 mg/dL (ref 70–99)
Potassium: 4.4 mEq/L (ref 3.5–5.1)
Sodium: 140 mEq/L (ref 135–145)

## 2010-11-19 LAB — LIPID PANEL
Cholesterol: 135 mg/dL (ref 0–200)
HDL: 40.8 mg/dL (ref >39.00)
LDL Cholesterol: 77 mg/dL (ref 0–99)
Total CHOL/HDL Ratio: 3
Triglycerides: 84 mg/dL (ref 0.0–149.0)
VLDL: 16.8 mg/dL (ref 0.0–40.0)

## 2010-11-19 NOTE — Assessment & Plan Note (Signed)
She is doing well. We will check labs today. I will continue her on her current medical regimen. She will see Dr. Jens Som for her follow up in 6 months. Patient is agreeable to this plan and will call if any problems develop in the interim.

## 2010-11-19 NOTE — Patient Instructions (Signed)
Stay on your current medicines We will see you back in about 6 months. I will have you see Dr. Olga Millers at your next visit. Call for any problems.

## 2010-11-19 NOTE — Progress Notes (Signed)
Natalie Bullock Date of Birth: February 19, 1925   History of Present Illness: Natalie Bullock is seen today for her 6 month visit. She is seen for Dr. Jens Som. She is a former patient of Dr. Deborah Chalk. She continues to do well. She is not having chest pain or shortness of breath. She was in the ER back in January with atypical chest pain. She was evaluated by Dr. Anne Fu. Her evaluation was negative. That was a very fleeting and sharp discomfort. It has not recurred. She does not have chest pain as a general rule. Her anginal presentation that led to bypass surgery was more of shortness of breath. She is mostly limited by her arthritis. Blood pressure is good at home. She is tolerating her medicines. Labs are checked today.   Current Outpatient Prescriptions on File Prior to Visit  Medication Sig Dispense Refill  . aspirin 81 MG tablet Take 81 mg by mouth daily. 2 DAILY       . levothyroxine (SYNTHROID, LEVOTHROID) 50 MCG tablet Take 50 mcg by mouth daily.        . naproxen sodium (ANAPROX) 220 MG tablet Take 220 mg by mouth as needed.        Bertram Gala Glycol-Propyl Glycol (SYSTANE OP) Apply to eye daily.        . polyethylene glycol (MIRALAX / GLYCOLAX) packet Take 17 g by mouth as needed.        . pravastatin (PRAVACHOL) 40 MG tablet Take 40 mg by mouth daily.        . ramipril (ALTACE) 10 MG tablet Take 10 mg by mouth daily.        . Simethicone (GAS-X PO) Take by mouth as needed.        . triamterene-hydrochlorothiazide (MAXZIDE-25) 37.5-25 MG per tablet Take 1 tablet by mouth daily.          Allergies  Allergen Reactions  . Doxycycline   . Lipitor (Atorvastatin Calcium)   . Sulfa Drugs Cross Reactors   . Tequin   . Tylenol (Acetaminophen)     NO. 3  . Valium   . Vytorin   . Zocor (Simvastatin)     Past Medical History  Diagnosis Date  . Arthritis   . IHD (ischemic heart disease)   . Hypertension   . Hypothyroidism   . Vitamin D deficiency   . Hyperlipidemia   . Breast cancer   .  Anemia     Past Surgical History  Procedure Date  . Cardiac catheterization 01/20/2001    NORMAL. EF 60-70%  . Coronary artery bypass graft 12/2000    LEFT INTERNAL MAMMARY TO THE LAD, SAPHENOUS VEIN GRAFT TO THE FIRST DIAGONAL, SAPHENOUS VEIN GRAFT TO THE RAMUS INTERMEDIATE, AND A SEQUENTIAL SAPHENOUS VEIN GRAFT TO THE POSTERIOR DESCENDING AND POSTERIOR LATERAL BRANCHES.  . Mastectomy 1975    RIGHT SIDE    History  Smoking status  . Former Smoker -- 1.0 packs/day for 4 years  . Types: Cigarettes  . Quit date: 06/02/1958  Smokeless tobacco  . Never Used    History  Alcohol Use No    Family History  Problem Relation Age of Onset  . Alzheimer's disease Mother     Review of Systems: The review of systems is positive for arthritis, especially in her back.  All other systems were reviewed and are negative.  Physical Exam: BP 130/70  Pulse 66  Ht 5\' 5"  (1.651 m)  Wt 153 lb (69.4 kg)  BMI 25.46  kg/m2 Patient is very pleasant and in no acute distress. Skin is warm and dry. Color is normal.  HEENT is unremarkable. Normocephalic/atraumatic. PERRL. Sclera are nonicteric. Neck is supple. No masses. No JVD. Lungs are clear. Cardiac exam shows a regular rate and rhythm. Abdomen is soft. Extremities are without edema. Gait and ROM are intact. No gross neurologic deficits noted.  LABORATORY DATA: Pending   Assessment / Plan:

## 2010-11-19 NOTE — Assessment & Plan Note (Signed)
She is tolerating her Pravachol. Labs are checked today. We will repeat in 6 months.

## 2010-11-20 ENCOUNTER — Telehealth: Payer: Self-pay | Admitting: *Deleted

## 2010-11-20 NOTE — Telephone Encounter (Signed)
Message copied by Adolphus Birchwood on Wed Nov 20, 2010  8:33 AM ------      Message from: Rosalio Macadamia      Created: Tue Nov 19, 2010  3:04 PM       Ok to report. Labs are satisfactory.  Stay on same medicines. Recheck BMET/HPF/LIPIDS  in 6 months. Increase water intake and avoid NSAIDS.

## 2010-11-20 NOTE — Telephone Encounter (Signed)
Pt notified of lab results and was instructed to increase water intake and avoid NSAIDS.  Pt will f/u in six months with Dr. Jens Som.

## 2010-11-26 ENCOUNTER — Other Ambulatory Visit: Payer: Self-pay | Admitting: *Deleted

## 2010-11-26 ENCOUNTER — Ambulatory Visit: Payer: Self-pay | Admitting: Nurse Practitioner

## 2011-01-08 ENCOUNTER — Telehealth: Payer: Self-pay | Admitting: Nurse Practitioner

## 2011-01-08 NOTE — Telephone Encounter (Signed)
Pt called about a prescription she is on, please call pt regarding medication, chart in box

## 2011-01-08 NOTE — Telephone Encounter (Signed)
Will talk to [facility]

## 2011-01-08 NOTE — Telephone Encounter (Signed)
Pt has questions about medication for paravachol, please call pt back, chart in box

## 2011-01-13 NOTE — Telephone Encounter (Signed)
Called stating she could not take Pravastatin-causing joint and muscle aches. Stopped taking Pravastatin x 1 wk ago and all joint pain is gone. Lawson Fiscal suggested Lipitor but she said she has tried that before and couldn't take that either. Lawson Fiscal suggested that she try Pravastatin 3 x week. She will try and call if unable to tolerate.

## 2011-02-17 ENCOUNTER — Other Ambulatory Visit: Payer: Self-pay | Admitting: *Deleted

## 2011-02-17 MED ORDER — RAMIPRIL 10 MG PO TABS: 10.0000 mg | ORAL_TABLET | Freq: Every day | ORAL | Status: DC

## 2011-02-17 NOTE — Telephone Encounter (Signed)
Fax received from pharmacy. Refill completed. Jodette Fatma Rutten RN  

## 2011-03-01 ENCOUNTER — Emergency Department (HOSPITAL_COMMUNITY): Payer: Medicare Other

## 2011-03-01 ENCOUNTER — Emergency Department (HOSPITAL_COMMUNITY)
Admission: EM | Admit: 2011-03-01 | Discharge: 2011-03-01 | Disposition: A | Discharge status: Home / Self Care | Payer: Medicare Other | Attending: Emergency Medicine | Admitting: Emergency Medicine

## 2011-03-01 DIAGNOSIS — R079 Chest pain, unspecified: Secondary | ICD-10-CM | POA: Insufficient documentation

## 2011-03-01 DIAGNOSIS — I2510 Atherosclerotic heart disease of native coronary artery without angina pectoris: Secondary | ICD-10-CM | POA: Insufficient documentation

## 2011-03-01 DIAGNOSIS — Z951 Presence of aortocoronary bypass graft: Secondary | ICD-10-CM | POA: Insufficient documentation

## 2011-03-01 DIAGNOSIS — Z853 Personal history of malignant neoplasm of breast: Secondary | ICD-10-CM | POA: Insufficient documentation

## 2011-03-01 LAB — CBC
Platelets: 162 10*3/uL (ref 150–400)
RBC: 3.75 MIL/uL — ABNORMAL LOW (ref 3.87–5.11)
WBC: 5.4 10*3/uL (ref 4.0–10.5)

## 2011-03-01 LAB — POCT I-STAT TROPONIN I: Troponin i, poc: 0 ng/mL (ref 0.00–0.08)

## 2011-03-01 LAB — BASIC METABOLIC PANEL
GFR calc Af Amer: >60 mL/min (ref >60)
GFR calc non Af Amer: 52 mL/min — ABNORMAL LOW (ref >60)
Potassium: 4.2 mEq/L (ref 3.5–5.1)
Sodium: 137 mEq/L (ref 135–145)

## 2011-03-05 ENCOUNTER — Ambulatory Visit (HOSPITAL_BASED_OUTPATIENT_CLINIC_OR_DEPARTMENT_OTHER): Admission: RE | Admit: 2011-03-05 | Payer: Medicare Other | Source: Ambulatory Visit | Admitting: Orthopedic Surgery

## 2011-03-07 ENCOUNTER — Telehealth: Payer: Self-pay | Admitting: Cardiology

## 2011-03-07 NOTE — Telephone Encounter (Signed)
Will need to review her chart; please pull. Natalie Bullock

## 2011-03-07 NOTE — Telephone Encounter (Signed)
Natalie Bullock would like to talk with you re pt having surgery

## 2011-03-07 NOTE — Telephone Encounter (Signed)
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 Will forward for dr Jens Som review Natalie Bullock

## 2011-03-12 NOTE — Telephone Encounter (Signed)
Dr Jens Som reviewed pts chart and if the pt had not been having problems we could give her clearance. Spoke with pt, she is concerned about this place in her hand and wants for [place] to look at it and see if we think she needs surgery. Explained to pt we may not be able to tell her that but she wants to be seen for clearance. She is scheduled to see lori gerhardt np on Friday this week. Deliah Goody

## 2011-03-14 ENCOUNTER — Ambulatory Visit (INDEPENDENT_AMBULATORY_CARE_PROVIDER_SITE_OTHER): Payer: Medicare Other | Admitting: Nurse Practitioner

## 2011-03-14 ENCOUNTER — Encounter: Payer: Self-pay | Admitting: Nurse Practitioner

## 2011-03-14 VITALS — BP 128/78 | HR 76 | Ht 64.0 in | Wt 157.8 lb

## 2011-03-14 DIAGNOSIS — I2510 Atherosclerotic heart disease of native coronary artery without angina pectoris: Secondary | ICD-10-CM

## 2011-03-14 DIAGNOSIS — Z01818 Encounter for other preprocedural examination: Secondary | ICD-10-CM

## 2011-03-14 NOTE — Patient Instructions (Signed)
We will need to update your stress test.  I will see you back afterwards with Dr. Jens Som for your clearance.

## 2011-03-14 NOTE — Assessment & Plan Note (Signed)
Her initial presentation was that of shortness of breath. However, she has now had 2 ER visits for atypical chest pain. We will go ahead and update her myoview. I will see her back on a day that Dr. Jens Som is here in the office. Patient is agreeable to this plan and will call if any problems develop in the interim.

## 2011-03-14 NOTE — Progress Notes (Signed)
Natalie Bullock Date of Birth: 1925-03-18   History of Present Illness: Natalie Bullock is seen today for a work in visit. She is seen for Dr. Jens Bullock. She is a former patient of Dr. Ronnald Bullock. She has known CAD with remote CABG ten years ago. Last nuclear in August of 2010. She presented with shortness of breath and never had chest pain. Other problems included HTN, hyperlipidemia and arthritis.  She comes in today. She is upset. She has been to the hand clinic and saw Dr. Merlyn Bullock for a "bubble in her left hand". Dr. Merlyn Bullock has told her this is a tumor. MRI was unrevealing per Natalie Bullock's report. Surgery was scheduled for earlier this month. She cancelled the date because she had no one to stay with her. On the rescheduled date, anesthesia requested cardiac clearance. She does not see Dr. Jens Bullock until December. She is frustrated.  She has had no recurrent shortness of breath. She will soon be 77 years of age. She has been to the ER twice this year with atypical chest pain. Last visit was September 29th. She describes it as fleeting and sharp. It was localized to the lower sternum. Both evaluations were negative.   Current Outpatient Prescriptions on File Prior to Visit  Medication Sig Dispense Refill  . aspirin 81 MG tablet Take 81 mg by mouth daily. 2 DAILY       . levothyroxine (SYNTHROID, LEVOTHROID) 50 MCG tablet Take 50 mcg by mouth daily.        . naproxen sodium (ANAPROX) 220 MG tablet Take 220 mg by mouth as needed.        Bertram Gala Glycol-Propyl Glycol (SYSTANE OP) Apply to eye daily.        . polyethylene glycol (MIRALAX / GLYCOLAX) packet Take 17 g by mouth as needed.        . pravastatin (PRAVACHOL) 40 MG tablet Take 40 mg by mouth daily.        . ramipril (ALTACE) 10 MG tablet Take 1 tablet (10 mg total) by mouth daily.  90 tablet  1  . Simethicone (GAS-X PO) Take by mouth as needed.        . triamterene-hydrochlorothiazide (MAXZIDE-25) 37.5-25 MG per tablet Take 1 tablet by mouth daily.           Allergies  Allergen Reactions  . Doxycycline   . Lipitor (Atorvastatin Calcium)   . Sulfa Drugs Cross Reactors   . Tequin   . Tylenol (Acetaminophen)     NO. 3  . Valium   . Vytorin   . Zocor (Simvastatin)     Past Medical History  Diagnosis Date  . Arthritis   . IHD (ischemic heart disease)     with CABG in August 2002  . Hypertension   . Hypothyroidism   . Vitamin D deficiency   . Hyperlipidemia   . Breast cancer 1975    Right sided mastectomy  . Anemia   . Hx of CABG     Past Surgical History  Procedure Date  . Cardiac catheterization 01/20/2001    NORMAL. EF 60-70%  . Coronary artery bypass graft 12/2000    LEFT INTERNAL MAMMARY TO THE LAD, SAPHENOUS VEIN GRAFT TO THE FIRST DIAGONAL, SAPHENOUS VEIN GRAFT TO THE RAMUS INTERMEDIATE, AND A SEQUENTIAL SAPHENOUS VEIN GRAFT TO THE POSTERIOR DESCENDING AND POSTERIOR LATERAL BRANCHES.  . Mastectomy 1975    RIGHT SIDE  . Cardiovascular stress test Aug 2010    Normal; EF 74%  History  Smoking status  . Former Smoker -- 1.0 packs/day for 4 years  . Types: Cigarettes  . Quit date: 06/02/1958  Smokeless tobacco  . Never Used    History  Alcohol Use No    Family History  Problem Relation Age of Onset  . Alzheimer's disease Mother     Review of Systems: The review of systems is per the HPI.  All other systems were reviewed and are negative.  Physical Exam: BP 128/78  Pulse 76  Ht 5\' 4"  (1.626 m)  Wt 157 lb 12.8 oz (71.578 kg)  BMI 27.09 kg/m2 Patient is very pleasant and in no acute distress. She appears younger than her stated age. Skin is warm and dry. Color is normal.  HEENT is unremarkable. Normocephalic/atraumatic. PERRL. Sclera are nonicteric. Neck is supple. No masses. No JVD. Lungs are clear. Cardiac exam shows a regular rate and rhythm. Abdomen is soft. Extremities are without edema. Gait and ROM are intact. No gross neurologic deficits noted.   LABORATORY DATA:   Assessment /  Plan:

## 2011-03-17 ENCOUNTER — Telehealth: Payer: Self-pay | Admitting: Nurse Practitioner

## 2011-03-17 NOTE — Telephone Encounter (Signed)
Pt understands lexiscan instructions.  Mylo Red RN

## 2011-03-17 NOTE — Telephone Encounter (Signed)
Pt called she has questions about stress test

## 2011-03-19 ENCOUNTER — Ambulatory Visit (HOSPITAL_COMMUNITY): Payer: Medicare Other | Attending: Cardiology | Admitting: Radiology

## 2011-03-19 DIAGNOSIS — R079 Chest pain, unspecified: Secondary | ICD-10-CM

## 2011-03-19 DIAGNOSIS — R0602 Shortness of breath: Secondary | ICD-10-CM

## 2011-03-19 DIAGNOSIS — Z01810 Encounter for preprocedural cardiovascular examination: Secondary | ICD-10-CM | POA: Insufficient documentation

## 2011-03-19 DIAGNOSIS — Z01818 Encounter for other preprocedural examination: Secondary | ICD-10-CM

## 2011-03-19 DIAGNOSIS — I2510 Atherosclerotic heart disease of native coronary artery without angina pectoris: Secondary | ICD-10-CM | POA: Insufficient documentation

## 2011-03-19 DIAGNOSIS — I25810 Atherosclerosis of coronary artery bypass graft(s) without angina pectoris: Secondary | ICD-10-CM

## 2011-03-19 MED ORDER — REGADENOSON 0.4 MG/5ML IV SOLN
0.4000 mg | Freq: Once | INTRAVENOUS | Status: AC
  Administered 2011-03-19: 0.4 mg via INTRAVENOUS

## 2011-03-19 MED ORDER — TECHNETIUM TC 99M TETROFOSMIN IV KIT
11.0000 | PACK | Freq: Once | INTRAVENOUS | Status: AC | PRN
  Administered 2011-03-19: 11 via INTRAVENOUS

## 2011-03-19 MED ORDER — TECHNETIUM TC 99M TETROFOSMIN IV KIT
33.0000 | PACK | Freq: Once | INTRAVENOUS | Status: AC | PRN
  Administered 2011-03-19: 33 via INTRAVENOUS

## 2011-03-19 NOTE — Progress Notes (Signed)
[facility] SITE 3 NUCLEAR MED [address] [place] [address] [phone]  Cardiology Nuclear Med Study  Natalie Bullock is a 77 y.o. female [id number] 1925-06-01   Nuclear Med Background Indication for Stress Test:  Evaluation for Ischemia, Pending Surgical Clearance- Hand surgery, Dr. Merlyn Lot and Graft Patency History: '02 CABG x5, '07 Echo: Nl mild LVH, '02 Heart Catheterization: EF 60-70% multi-vessel dz sent for CABG, 2002 Myocardial Infarction: Non Q wave MI  and 08/10 Myocardial Perfusion Study: NL EF 74% Cardiac Risk Factors: History of Smoking, Hypertension and Lipids  Symptoms:  Chest Pain and SOB   Nuclear Pre-Procedure Caffeine/Decaff Intake:  None NPO After: 7:00pm   Lungs: clear IV 0.9% NS with Angio Cath:  22g  IV Site: L Forearm  IV Started by:  Stanton Kidney, EMT-P  Chest Size (in):  34 Cup Size: B  Height: 5\' 4"  (1.626 m)  Weight:  150 lb (68.04 kg)  BMI:  Body mass index is 25.75 kg/(m^2). Tech Comments:  NA    Nuclear Med Study 1 or 2 day study: 1 day  Stress Test Type:  Eugenie Birks  Reading MD: Marca Ancona, MD  Order Authorizing Provider:  B.Crenshaw  Resting Radionuclide: Technetium 5m Tetrofosmin  Resting Radionuclide Dose: 11.0 mCi   Stress Radionuclide:  Technetium 38m Tetrofosmin  Stress Radionuclide Dose: 33.0 mCi           Stress Protocol Rest HR: 50 Stress HR: 100  Rest BP: 155/64 Stress BP: 149/50  Exercise Time (min): n/a METS: n/a   Predicted Max HR: 137 bpm % Max HR: 72.99 bpm Rate Pressure Product: 41324   Dose of Adenosine (mg):  n/a Dose of Lexiscan: 0.4 mg  Dose of Atropine (mg): n/a Dose of Dobutamine: n/a mcg/kg/min (at max HR)  Stress Test Technologist: Milana Na, EMT-P  Nuclear Technologist:  Domenic Polite, CNMT     Rest Procedure:  Myocardial perfusion imaging was performed at rest 45 minutes following the intravenous administration of Technetium 1m Tetrofosmin. Rest ECG: Sinus  Bradycardia  Stress Procedure:  The patient received IV Lexiscan 0.4 mg over 15-seconds.  Technetium 55m Tetrofosmin injected at 30-seconds.  There were no significant changes and sob with Lexiscan.  Quantitative spect images were obtained after a 45 minute delay. Stress ECG: No significant change from baseline ECG  QPS Raw Data Images:  Normal; no motion artifact; normal heart/lung ratio. Stress Images:  Normal homogeneous uptake in all areas of the myocardium. Rest Images:  Normal homogeneous uptake in all areas of the myocardium. Subtraction (SDS):  There is no evidence of scar or ischemia. Transient Ischemic Dilatation (Normal <1.22):  0.92 Lung/Heart Ratio (Normal <0.45):  0.28  Quantitative Gated Spect Images QGS EDV:  67 ml QGS ESV:  17 ml QGS cine images:  NL LV Function; NL Wall Motion QGS EF: 74%  Impression Exercise Capacity:  Lexiscan with no exercise. BP Response:  Normal blood pressure response. Clinical Symptoms:  Short of breath.  ECG Impression:  No significant ST segment change suggestive of ischemia. Comparison with Prior Nuclear Study: No significant change from previous study  Overall Impression:  Normal stress nuclear study.  [name] [employer]

## 2011-03-21 ENCOUNTER — Ambulatory Visit (INDEPENDENT_AMBULATORY_CARE_PROVIDER_SITE_OTHER): Payer: Medicare Other | Admitting: Nurse Practitioner

## 2011-03-21 ENCOUNTER — Encounter: Payer: Self-pay | Admitting: Nurse Practitioner

## 2011-03-21 VITALS — BP 130/62 | HR 66 | Ht 64.0 in | Wt 153.8 lb

## 2011-03-21 DIAGNOSIS — I2510 Atherosclerotic heart disease of native coronary artery without angina pectoris: Secondary | ICD-10-CM

## 2011-03-21 NOTE — Patient Instructions (Addendum)
Your stress test is ok.  You may have your hand surgery.  We will see you back at your regular time with Dr. Jens Som.   Call for any problems.

## 2011-03-21 NOTE — Progress Notes (Signed)
[name] Date of Birth: [birth year] Medical Record #[id number]  History of Present Illness: Natalie Bullock is seen today for a follow up visit. She is seen for Dr. Jens Som. She is a former patient of Dr. Ronnald Nian. She is doing well. She has no cardiac complaints. She will be needing hand surgery with Dr. Merlyn Lot. She was originally scheduled earlier but anesthesia requested clearance. She had been having some atypical chest pain. Her symptoms prior to heart surgery was just shortness of breath. She has had no recurrence of shortness of breath. She feels well.   Current Outpatient Prescriptions on File Prior to Visit  Medication Sig Dispense Refill  . acetaminophen (TYLENOL ARTHRITIS PAIN) 650 MG CR tablet Take 1,300 mg by mouth 2 (two) times a week.        Marland Kitchen aspirin 81 MG tablet Take 81 mg by mouth daily. 2 DAILY       . levothyroxine (SYNTHROID, LEVOTHROID) 50 MCG tablet Take 50 mcg by mouth daily.        . naproxen sodium (ANAPROX) 220 MG tablet Take 220 mg by mouth as needed.        Bertram Gala Glycol-Propyl Glycol (SYSTANE OP) Apply to eye daily.        . polyethylene glycol (MIRALAX / GLYCOLAX) packet Take 17 g by mouth as needed.        . pravastatin (PRAVACHOL) 40 MG tablet Take 40 mg by mouth daily.        . ramipril (ALTACE) 10 MG tablet Take 1 tablet (10 mg total) by mouth daily.  90 tablet  1  . Simethicone (GAS-X PO) Take by mouth as needed.        . triamterene-hydrochlorothiazide (MAXZIDE-25) 37.5-25 MG per tablet Take 1 tablet by mouth daily.          Allergies  Allergen Reactions  . Doxycycline   . Lipitor (Atorvastatin Calcium)   . Sulfa Drugs Cross Reactors   . Tequin   . Tylenol (Acetaminophen)     NO. 3  . Valium   . Vytorin   . Zocor (Simvastatin)     Past Medical History  Diagnosis Date  . Arthritis   . IHD (ischemic heart disease)     with CABG in August 2002  . Hypertension   . Hypothyroidism   . Vitamin D deficiency   . Hyperlipidemia   . Breast  cancer 1975    Right sided mastectomy  . Anemia   . Hx of CABG   . Normal nuclear stress test October 2012    Past Surgical History  Procedure Date  . Cardiac catheterization 01/20/2001    NORMAL. EF 60-70%  . Coronary artery bypass graft 12/2000    LEFT INTERNAL MAMMARY TO THE LAD, SAPHENOUS VEIN GRAFT TO THE FIRST DIAGONAL, SAPHENOUS VEIN GRAFT TO THE RAMUS INTERMEDIATE, AND A SEQUENTIAL SAPHENOUS VEIN GRAFT TO THE POSTERIOR DESCENDING AND POSTERIOR LATERAL BRANCHES.  . Mastectomy 1975    RIGHT SIDE  . Cardiovascular stress test Aug 2010    Normal; EF 74%    History  Smoking status  . Former Smoker -- 1.0 packs/day for 4 years  . Types: Cigarettes  . Quit date: 06/02/1958  Smokeless tobacco  . Never Used    History  Alcohol Use No    Family History  Problem Relation Age of Onset  . Alzheimer's disease Mother     Review of Systems: The review of systems is per the HPI. No further  chest pain, no shortness of breath, no CHF symptoms. Her Myoview was normal.  All other systems were reviewed and are negative.  Physical Exam: BP 130/62  Pulse 66  Ht 5\' 4"  (1.626 m)  Wt 153 lb 12.8 oz (69.763 kg)  BMI 26.40 kg/m2 Patient is very pleasant and in no acute distress. She is well dressed. Skin is warm and dry. Color is normal.  HEENT is unremarkable. Normocephalic/atraumatic. PERRL. Sclera are nonicteric. Neck is supple. No masses. No JVD. Lungs are clear. Cardiac exam shows a regular rate and rhythm. Abdomen is soft. Extremities are without edema. Gait and ROM are intact. No gross neurologic deficits noted.  LABORATORY DATA: Myoview shows normal perfusion and EF of 74%.  Assessment / Plan:

## 2011-03-21 NOTE — Assessment & Plan Note (Signed)
She is felt to be stable from our standpoint. She is ok for surgery. We will see her back at her regular appointment time with Dr. Jens Som. No change in her medicines. Patient is agreeable to this plan and will call if any problems develop in the interim.

## 2011-04-02 ENCOUNTER — Encounter (HOSPITAL_BASED_OUTPATIENT_CLINIC_OR_DEPARTMENT_OTHER): Payer: Self-pay | Admitting: *Deleted

## 2011-04-02 NOTE — Anesthesia Preprocedure Evaluation (Addendum)
Anesthesia Evaluation  Patient identified by MRN, date of birth, ID band Patient awake    Reviewed: Allergy & Precautions, H&P , NPO status , Patient's Chart, lab work & pertinent test results  Airway Mallampati: I TM Distance: >3 FB Neck ROM: Full    Dental  (+) Edentulous Upper, Edentulous Lower and Dental Advisory Given   Pulmonary neg pulmonary ROS,    Pulmonary exam normal       Cardiovascular Exercise Tolerance: Good hypertension, Pt. on medications + CAD (CABG 10 yrs ago. Good result with neg stresss test recentlly at [facility])     Neuro/Psych Negative Neurological ROS  Negative Psych ROS   GI/Hepatic negative GI ROS, Neg liver ROS,   Endo/Other    Renal/GU negative Renal ROS     Musculoskeletal negative musculoskeletal ROS (+)   Abdominal   Peds  Hematology negative hematology ROS (+)   Anesthesia Other Findings   Reproductive/Obstetrics                         Anesthesia Physical Anesthesia Plan  ASA: III  Anesthesia Plan: MAC   Post-op Pain Management:    Induction: Intravenous  Airway Management Planned: Simple Face Mask  Additional Equipment:   Intra-op Plan:   Post-operative Plan:   Informed Consent: I have reviewed the patients History and Physical, chart, labs and discussed the procedure including the risks, benefits and alternatives for the proposed anesthesia with the patient or authorized representative who has indicated his/her understanding and acceptance.   Dental advisory given  Plan Discussed with: CRNA and Surgeon  Anesthesia Plan Comments:         Anesthesia Quick Evaluation

## 2011-04-07 ENCOUNTER — Inpatient Hospital Stay (HOSPITAL_BASED_OUTPATIENT_CLINIC_OR_DEPARTMENT_OTHER)
Admission: RE | Admit: 2011-04-07 | Discharge: 2011-04-07 | Disposition: A | Discharge status: Home / Self Care | Payer: Medicare Other | Source: Ambulatory Visit

## 2011-04-07 LAB — BASIC METABOLIC PANEL
Chloride: 101 mEq/L (ref 96–112)
GFR calc Af Amer: 64 mL/min — ABNORMAL LOW (ref >90)
Potassium: 4.4 mEq/L (ref 3.5–5.1)
Sodium: 139 mEq/L (ref 135–145)

## 2011-04-08 ENCOUNTER — Ambulatory Visit (HOSPITAL_BASED_OUTPATIENT_CLINIC_OR_DEPARTMENT_OTHER): Payer: Medicare Other | Admitting: Anesthesiology

## 2011-04-08 ENCOUNTER — Other Ambulatory Visit: Payer: Self-pay | Admitting: Orthopedic Surgery

## 2011-04-08 ENCOUNTER — Encounter (HOSPITAL_BASED_OUTPATIENT_CLINIC_OR_DEPARTMENT_OTHER): Payer: Self-pay | Admitting: Anesthesiology

## 2011-04-08 ENCOUNTER — Ambulatory Visit (HOSPITAL_BASED_OUTPATIENT_CLINIC_OR_DEPARTMENT_OTHER)
Admission: RE | Admit: 2011-04-08 | Discharge: 2011-04-08 | Disposition: A | Discharge status: Home / Self Care | Payer: Medicare Other | Source: Ambulatory Visit | Attending: Orthopedic Surgery | Admitting: Orthopedic Surgery

## 2011-04-08 ENCOUNTER — Encounter (HOSPITAL_BASED_OUTPATIENT_CLINIC_OR_DEPARTMENT_OTHER): Payer: Self-pay | Admitting: *Deleted

## 2011-04-08 ENCOUNTER — Encounter (HOSPITAL_BASED_OUTPATIENT_CLINIC_OR_DEPARTMENT_OTHER)
Admission: RE | Disposition: A | Discharge status: Home / Self Care | Payer: Self-pay | Source: Ambulatory Visit | Attending: Orthopedic Surgery

## 2011-04-08 ENCOUNTER — Encounter (HOSPITAL_BASED_OUTPATIENT_CLINIC_OR_DEPARTMENT_OTHER): Payer: Self-pay | Admitting: Orthopedic Surgery

## 2011-04-08 DIAGNOSIS — Z01812 Encounter for preprocedural laboratory examination: Secondary | ICD-10-CM | POA: Insufficient documentation

## 2011-04-08 DIAGNOSIS — I10 Essential (primary) hypertension: Secondary | ICD-10-CM | POA: Insufficient documentation

## 2011-04-08 DIAGNOSIS — R229 Localized swelling, mass and lump, unspecified: Secondary | ICD-10-CM | POA: Insufficient documentation

## 2011-04-08 HISTORY — DX: Atherosclerotic heart disease of native coronary artery without angina pectoris: I25.10

## 2011-04-08 HISTORY — PX: MASS EXCISION: SHX2000

## 2011-04-08 SURGERY — EXCISION MASS
Anesthesia: Monitor Anesthesia Care | Laterality: Left | Wound class: Clean

## 2011-04-08 MED ORDER — LACTATED RINGERS IV SOLN
INTRAVENOUS | Status: DC
  Administered 2011-04-08: 09:00:00 via INTRAVENOUS

## 2011-04-08 MED ORDER — CEFAZOLIN SODIUM 1-5 GM-% IV SOLN
INTRAVENOUS | Status: DC | PRN
  Administered 2011-04-08: 1 g via INTRAVENOUS

## 2011-04-08 MED ORDER — PROPOFOL 10 MG/ML IV EMUL
INTRAVENOUS | Status: DC | PRN
  Administered 2011-04-08: 50 ug/kg/min via INTRAVENOUS

## 2011-04-08 MED ORDER — FENTANYL CITRATE 0.05 MG/ML IJ SOLN
INTRAMUSCULAR | Status: DC | PRN
  Administered 2011-04-08: 50 ug via INTRAVENOUS

## 2011-04-08 MED ORDER — LACTATED RINGERS IV SOLN
INTRAVENOUS | Status: DC | PRN
  Administered 2011-04-08: 10:00:00 via INTRAVENOUS

## 2011-04-08 MED ORDER — ONDANSETRON HCL 4 MG/2ML IJ SOLN
INTRAMUSCULAR | Status: DC | PRN
  Administered 2011-04-08: 4 mg via INTRAVENOUS

## 2011-04-08 MED ORDER — CEFAZOLIN SODIUM 1-5 GM-% IV SOLN: 1.0000 g | INTRAVENOUS | Status: DC

## 2011-04-08 MED ORDER — LIDOCAINE HCL (PF) 1 % IJ SOLN
INTRAMUSCULAR | Status: DC | PRN
  Administered 2011-04-08: 1.5 mL

## 2011-04-08 SURGICAL SUPPLY — 29 items
BANDAGE COBAN STERILE 2 (GAUZE/BANDAGES/DRESSINGS) ×2 IMPLANT
BLADE SURG 15 STRL LF DISP TIS (BLADE) ×1 IMPLANT
BLADE SURG 15 STRL SS (BLADE) ×1
CHLORAPREP W/TINT 26ML (MISCELLANEOUS) ×2 IMPLANT
CLOTH BEACON ORANGE TIMEOUT ST (SAFETY) ×2 IMPLANT
CORDS BIPOLAR (ELECTRODE) ×2 IMPLANT
COVER MAYO STAND STRL (DRAPES) ×2 IMPLANT
COVER TABLE BACK 60X90 (DRAPES) ×2 IMPLANT
DRAPE EXTREMITY T 121X128X90 (DRAPE) ×2 IMPLANT
DRAPE SURG 17X23 STRL (DRAPES) ×2 IMPLANT
DRSG XEROFORM 1X8 (GAUZE/BANDAGES/DRESSINGS) ×2 IMPLANT
GAUZE XEROFORM 1X8 LF (GAUZE/BANDAGES/DRESSINGS) ×2 IMPLANT
GLOVE BIO SURGEON STRL SZ7.5 (GLOVE) ×2 IMPLANT
GLOVE BIOGEL PI IND STRL 8 (GLOVE) ×1 IMPLANT
GLOVE BIOGEL PI INDICATOR 8 (GLOVE) ×1
GLOVE SKINSENSE NS SZ7.0 (GLOVE) ×1
GLOVE SKINSENSE STRL SZ7.0 (GLOVE) ×1 IMPLANT
GLOVE SURG ORTHO 8.0 STRL STRW (GLOVE) ×2 IMPLANT
GOWN BRE IMP PREV XXLGXLNG (GOWN DISPOSABLE) ×2 IMPLANT
GOWN PREVENTION PLUS XLARGE (GOWN DISPOSABLE) ×4 IMPLANT
NS IRRIG 1000ML POUR BTL (IV SOLUTION) ×2 IMPLANT
PACK BASIN DAY SURGERY FS (CUSTOM PROCEDURE TRAY) ×2 IMPLANT
SPONGE GAUZE 4X4 12PLY (GAUZE/BANDAGES/DRESSINGS) ×2 IMPLANT
SPONGE GAUZE 4X4 FOR O.R. (GAUZE/BANDAGES/DRESSINGS) ×2 IMPLANT
STOCKINETTE 4X48 STRL (DRAPES) ×2 IMPLANT
SUT VICRYL RAPIDE 4/0 PS 2 (SUTURE) ×2 IMPLANT
SYR BULB 3OZ (MISCELLANEOUS) ×2 IMPLANT
TOWEL OR 17X24 6PK STRL BLUE (TOWEL DISPOSABLE) ×2 IMPLANT
UNDERPAD 30X30 INCONTINENT (UNDERPADS AND DIAPERS) ×2 IMPLANT

## 2011-04-08 NOTE — Brief Op Note (Signed)
04/08/2011  10:43 AM  PATIENT:  Natalie Bullock  77 y.o. female  PRE-OPERATIVE DIAGNOSIS:  mass left palm  POST-OPERATIVE DIAGNOSIS:  mass left palm  PROCEDURE:  Procedure(s): EXCISION MASS  SURGEON:  Surgeon(s): Nicki Reaper, MD Tami Ribas  PHYSICIAN ASSISTANT:   ASSISTANTS: Karlyn Agee, MD   ANESTHESIA:   local and regional  EBL:  Total I/O In: 200 [I.V.:200] Out: -   BLOOD ADMINISTERED:none  DRAINS: none   LOCAL MEDICATIONS USED:  MARCAINE 3CC  SPECIMEN:  Excision  DISPOSITION OF SPECIMEN:  PATHOLOGY  COUNTS:  YES  TOURNIQUET:  * Missing tourniquet times found for documented tourniquets in log:  6215 *  DICTATION: .Other Dictation: Dictation Number [id number]  PLAN OF CARE: Discharge to home after PACU  PATIENT DISPOSITION:  PACU - hemodynamically stable.   Delay start of Pharmacological VTE agent (>24hrs) due to surgical blood loss or risk of bleeding:  yes

## 2011-04-08 NOTE — Progress Notes (Signed)
Nursing Note Due to patient restricted extremity and surgical site, waiting for surgeon to confirm IV site placement.

## 2011-04-08 NOTE — Anesthesia Postprocedure Evaluation (Signed)
  Anesthesia Post-op Note  Patient: Natalie Bullock  Procedure(s) Performed:  EXCISION MASS - excision mass left palm  Patient Location: PACU  Anesthesia Type: MAC and Bier block  Level of Consciousness: awake, alert  and oriented  Airway and Oxygen Therapy: Patient Spontanous Breathing and Patient connected to face mask oxygen  Post-op Pain: none  Post-op Assessment: Post-op Vital signs reviewed, Patient's Cardiovascular Status Stable, Respiratory Function Stable, Patent Airway and No signs of Nausea or vomiting  Post-op Vital Signs: Reviewed and stable  Complications: No apparent anesthesia complications

## 2011-04-08 NOTE — H&P (Signed)
Natalie Bullock is an 77 y.o. female.   Chief Complaint: Mass left hand HPI: 77 year old female with mass in left palm.  Past Medical History  Diagnosis Date  . IHD (ischemic heart disease)     with CABG in August 2002  . Hypertension   . Hypothyroidism   . Vitamin D deficiency   . Hyperlipidemia   . Anemia   . Hx of CABG     pt states she had 5 bypasses  . Normal nuclear stress test October 2012  . Breast cancer 1975    Right sided mastectomy  . Coronary artery disease   . Arthritis     Past Surgical History  Procedure Date  . Coronary artery bypass graft 12/2000    LEFT INTERNAL MAMMARY TO THE LAD, SAPHENOUS VEIN GRAFT TO THE FIRST DIAGONAL, SAPHENOUS VEIN GRAFT TO THE RAMUS INTERMEDIATE, AND A SEQUENTIAL SAPHENOUS VEIN GRAFT TO THE POSTERIOR DESCENDING AND POSTERIOR LATERAL BRANCHES.  . Mastectomy 1975    RIGHT SIDE  . Cardiovascular stress test Aug 2010    Normal; EF 74%  . Cardiac catheterization 01/20/2001    NORMAL. EF 60-70%  . Tubal ligation     1960's  . Breast surgery     left breast cyst surgery     Family History  Problem Relation Age of Onset  . Alzheimer's disease Mother    Social History:  reports that she quit smoking about 52 years ago. Her smoking use included Cigarettes. She has a 4 pack-year smoking history. She has never used smokeless tobacco. She reports that she does not drink alcohol or use illicit drugs.  Allergies:  Allergies  Allergen Reactions  . Valium Shortness Of Breath  . Crestor (Rosuvastatin Calcium)   . Doxycycline   . Lipitor (Atorvastatin Calcium)   . Sulfa Drugs Cross Reactors   . Tequin   . Tylenol (Acetaminophen)     NO. 3  . Vytorin   . Zocor (Simvastatin)     Medications Prior to Admission  Medication Dose Route Frequency Provider Last Rate Last Dose  . ceFAZolin (ANCEF) IVPB 1 g/50 mL premix  1 g Intravenous 60 min Pre-Op Nicki Reaper, MD      . lactated ringers infusion   Intravenous Continuous Remonia Richter,  MD 10 mL/hr at 04/08/11 0900     Medications Prior to Admission  Medication Sig Dispense Refill  . acetaminophen (TYLENOL ARTHRITIS PAIN) 650 MG CR tablet Take 1,300 mg by mouth 2 (two) times a week.       Marland Kitchen aspirin 81 MG tablet Take 81 mg by mouth daily. 2 DAILY      . levothyroxine (SYNTHROID, LEVOTHROID) 50 MCG tablet Take 50 mcg by mouth daily.       . naproxen sodium (ANAPROX) 220 MG tablet Take 220 mg by mouth as needed.       Bertram Gala Glycol-Propyl Glycol (SYSTANE OP) Apply to eye daily.       . polyethylene glycol (MIRALAX / GLYCOLAX) packet Take 17 g by mouth as needed.       . pravastatin (PRAVACHOL) 40 MG tablet Take 40 mg by mouth daily.       . ramipril (ALTACE) 10 MG tablet Take 1 tablet (10 mg total) by mouth daily.  90 tablet  1  . Simethicone (GAS-X PO) Take by mouth as needed.       . triamterene-hydrochlorothiazide (MAXZIDE-25) 37.5-25 MG per tablet Take 1 tablet by mouth daily.  Results for orders placed during the hospital encounter of 04/08/11 (from the past 48 hour(s))  BASIC METABOLIC PANEL     Status: Abnormal   Collection Time   04/07/11  1:29 PM      Component Value Range Comment   Sodium 139  135 - 145 (mEq/L)    Potassium 4.4  3.5 - 5.1 (mEq/L)    Chloride 101  96 - 112 (mEq/L)    CO2 28  19 - 32 (mEq/L)    Glucose, Bld 92  70 - 99 (mg/dL)    BUN 27 (*) 6 - 23 (mg/dL)    Creatinine, Ser 0.86  0.50 - 1.10 (mg/dL)    Calcium 57.8 (*) 8.4 - 10.5 (mg/dL)    GFR calc non Af Amer 55 (*) >90 (mL/min)    GFR calc Af Amer 64 (*) >90 (mL/min)     No results found.   A comprehensive review of systems was negative.  Blood pressure 159/74, pulse 78, temperature 98 F (36.7 C), temperature source Oral, resp. rate 20, SpO2 99.00%.  General appearance: alert, cooperative and appears stated age Head: Normocephalic, without obvious abnormality, atraumatic Neck: no adenopathy, no carotid bruit, no JVD, supple, symmetrical, trachea midline and thyroid not  enlarged, symmetric, no tenderness/mass/nodules Resp: clear to auscultation bilaterally Cardio: regular rate and rhythm, S1, S2 normal, no murmur, click, rub or gallop GI: soft, non-tender; bowel sounds normal; no masses,  no organomegaly Extremities: extremities normal, atraumatic, no cyanosis or edema Pulses: 2+ and symmetric Skin: Skin color, texture, turgor normal. No rashes or lesions Neurologic: Grossly normal Incision/Wound: na  Assessment/Plan Plan excision mass left palm  Russ Looper R 04/08/2011, 9:58 AM

## 2011-04-08 NOTE — Transfer of Care (Signed)
Immediate Anesthesia Transfer of Care Note  Patient: Natalie Bullock  Procedure(s) Performed:  EXCISION MASS - excision mass left palm  Patient Location: PACU  Anesthesia Type: MAC  Level of Consciousness: awake and patient cooperative  Airway & Oxygen Therapy: Patient Spontanous Breathing and Patient connected to face mask oxygen  Post-op Assessment: Report given to PACU RN and Post -op Vital signs reviewed and stable  Post vital signs: Reviewed and stable  Complications: No apparent anesthesia complications

## 2011-04-09 NOTE — Op Note (Signed)
NAMEITALY, WARRINER                  ACCOUNT NO.:  [id number]  MEDICAL RECORD NO.:  [id number]  LOCATION:                                 FACILITY:  PHYSICIAN:  Cindee Salt, M.D.       DATE OF BIRTH:  August 09, 1924  DATE OF PROCEDURE:  04/08/2011 DATE OF DISCHARGE:                              OPERATIVE REPORT   PREOPERATIVE DIAGNOSIS:  Mass in left palm.  POSTOPERATIVE DIAGNOSIS:  Mass in left palm.  OPERATION:  Excisional biopsy of mass in left palm.  SURGEON:  Cindee Salt, MD.  ASSISTANT:  Betha Loa, MD.  ANESTHESIA:  Forearm based IV regional with local infiltration.  PROCEDURE:  The patient was brought to the operating room where a forearm based IV regional anesthetic was carried out without difficulty. She was prepped using ChloraPrep, supine position with the left arm free.  A 3-minute dry time was allowed.  Time-out was taken, confirming the patient procedure.  An elliptical incision was made over the lesion, and the skin which she was concerned with pigmented in nature, carried down through subcutaneous tissue.  With blunt sharp dissection, the dissection was carried down to the fascia.  This was incised taking care to protect the neurovascular bundles.  No further lesions were identified.  One large Paccinian corpuscle was noted; otherwise, there were no lesions, no swelling, no other abnormality was noted.  The specimen was sent to pathology as to wounds was irrigated with saline and closed with horizontal mattress 4-0 Vicryl Rapide sutures.  Local infiltration with 3 mL of Marcaine was then given.  The patient was placed in a compressive dressing.  No splint.  Her fingers were left free.  On deflation of the tourniquet, all fingers immediately pinked. She was taken to the recovery room for observation in satisfactory condition.          ______________________________ Cindee Salt, M.D.     GK/MEDQ  D:  04/08/2011  T:  04/08/2011  Job:  [id number]

## 2011-04-14 ENCOUNTER — Encounter (HOSPITAL_BASED_OUTPATIENT_CLINIC_OR_DEPARTMENT_OTHER): Payer: Self-pay | Admitting: Orthopedic Surgery

## 2011-05-15 ENCOUNTER — Ambulatory Visit (INDEPENDENT_AMBULATORY_CARE_PROVIDER_SITE_OTHER): Payer: Medicare Other | Admitting: Cardiology

## 2011-05-15 ENCOUNTER — Encounter: Payer: Self-pay | Admitting: Cardiology

## 2011-05-15 DIAGNOSIS — I2510 Atherosclerotic heart disease of native coronary artery without angina pectoris: Secondary | ICD-10-CM

## 2011-05-15 DIAGNOSIS — E785 Hyperlipidemia, unspecified: Secondary | ICD-10-CM

## 2011-05-15 NOTE — Patient Instructions (Signed)
Your physician wants you to follow-up in: ONE YEAR You will receive a reminder letter in the mail two months in advance. If you don't receive a letter, please call our office to schedule the follow-up appointment.  

## 2011-05-15 NOTE — Assessment & Plan Note (Signed)
Continue aspirin and statin. 

## 2011-05-15 NOTE — Progress Notes (Signed)
HPI: 77 yo female previously followed by Dr Deborah Chalk for fu of CAD. Patient had CABG in 2002. Carotid dopplers in 2002 normal. Echo in May of 2007 showed normal LV function and mild LVH. Myoview in Oct 2012 showed EF 74 and normal perfusion. Intolerant to all statins except pravachol. The patient denies any dyspnea on exertion, orthopnea, PND, pedal edema, palpitations, syncope or chest pain.   Current Outpatient Prescriptions  Medication Sig Dispense Refill  . acetaminophen (TYLENOL ARTHRITIS PAIN) 650 MG CR tablet Take 1,300 mg by mouth 2 (two) times a week.       Marland Kitchen acetaminophen (TYLENOL) 500 MG tablet Take 500 mg by mouth every 6 (six) hours as needed.        Marland Kitchen aspirin 81 MG tablet Take 81 mg by mouth daily. 2 DAILY      . levothyroxine (SYNTHROID, LEVOTHROID) 50 MCG tablet Take 50 mcg by mouth daily.       . naproxen sodium (ANAPROX) 220 MG tablet Take 220 mg by mouth as needed.       Bertram Gala Glycol-Propyl Glycol (SYSTANE OP) Apply to eye daily.       . polyethylene glycol (MIRALAX / GLYCOLAX) packet Take 17 g by mouth as needed.       . pravastatin (PRAVACHOL) 40 MG tablet Take 40 mg by mouth daily.       . ramipril (ALTACE) 10 MG tablet Take 1 tablet (10 mg total) by mouth daily.  90 tablet  1  . Simethicone (GAS-X PO) Take by mouth as needed.       . triamterene-hydrochlorothiazide (MAXZIDE-25) 37.5-25 MG per tablet Take 1 tablet by mouth daily.          Past Medical History  Diagnosis Date  . IHD (ischemic heart disease)     with CABG in August 2002  . Hypertension   . Hypothyroidism   . Vitamin D deficiency   . Hyperlipidemia   . Anemia   . Hx of CABG     pt states she had 5 bypasses  . Breast cancer 1975    Right sided mastectomy  . Coronary artery disease   . Arthritis     Past Surgical History  Procedure Date  . Coronary artery bypass graft 12/2000    LEFT INTERNAL MAMMARY TO THE LAD, SAPHENOUS VEIN GRAFT TO THE FIRST DIAGONAL, SAPHENOUS VEIN GRAFT TO THE  RAMUS INTERMEDIATE, AND A SEQUENTIAL SAPHENOUS VEIN GRAFT TO THE POSTERIOR DESCENDING AND POSTERIOR LATERAL BRANCHES.  . Mastectomy 1975    RIGHT SIDE  . Cardiovascular stress test Aug 2010    Normal; EF 74%  . Cardiac catheterization 01/20/2001    NORMAL. EF 60-70%  . Tubal ligation     1960's  . Breast surgery     left breast cyst surgery   . Mass excision 04/08/2011    Procedure: EXCISION MASS;  Surgeon: Nicki Reaper, MD;  Location: [facility];  Service: Orthopedics;  Laterality: Left;  excision mass left palm    History   Social History  . Marital Status: Widowed    Spouse Name: N/A    Number of Children: N/A  . Years of Education: N/A   Occupational History  . Not on file.   Social History Main Topics  . Smoking status: Former Smoker -- 1.0 packs/day for 4 years    Types: Cigarettes    Quit date: 06/02/1958  . Smokeless tobacco: Never Used  . Alcohol Use: No  .  Drug Use: No  . Sexually Active: Not on file   Other Topics Concern  . Not on file   Social History Narrative  . No narrative on file    ROS: no fevers or chills, productive cough, hemoptysis, dysphasia, odynophagia, melena, hematochezia, dysuria, hematuria, rash, seizure activity, orthopnea, PND, pedal edema, claudication. Remaining systems are negative.  Physical Exam: Well-developed well-nourished in no acute distress.  Skin is warm and dry.  HEENT is normal.  Neck is supple. No thyromegaly.  Chest is clear to auscultation with normal expansion.  Cardiovascular exam is regular rate and rhythm.  Abdominal exam nontender or distended. No masses palpated. Extremities show no edema. neuro grossly intact

## 2011-05-15 NOTE — Assessment & Plan Note (Signed)
Continue statin. 

## 2011-06-27 ENCOUNTER — Other Ambulatory Visit: Payer: Self-pay | Admitting: *Deleted

## 2011-06-27 MED ORDER — PRAVASTATIN SODIUM 40 MG PO TABS: 40.0000 mg | ORAL_TABLET | Freq: Every day | ORAL | Status: DC

## 2011-08-02 ENCOUNTER — Encounter (HOSPITAL_COMMUNITY): Payer: Self-pay | Admitting: Family Medicine

## 2011-08-02 ENCOUNTER — Emergency Department (HOSPITAL_COMMUNITY)
Admission: EM | Admit: 2011-08-02 | Discharge: 2011-08-03 | Disposition: A | Discharge status: Home / Self Care | Payer: Medicare Other | Attending: Emergency Medicine | Admitting: Emergency Medicine

## 2011-08-02 ENCOUNTER — Emergency Department (HOSPITAL_COMMUNITY): Payer: Medicare Other

## 2011-08-02 ENCOUNTER — Other Ambulatory Visit: Payer: Self-pay

## 2011-08-02 DIAGNOSIS — R079 Chest pain, unspecified: Secondary | ICD-10-CM | POA: Insufficient documentation

## 2011-08-02 DIAGNOSIS — I219 Acute myocardial infarction, unspecified: Secondary | ICD-10-CM | POA: Diagnosis not present

## 2011-08-02 DIAGNOSIS — E785 Hyperlipidemia, unspecified: Secondary | ICD-10-CM | POA: Diagnosis not present

## 2011-08-02 DIAGNOSIS — I2510 Atherosclerotic heart disease of native coronary artery without angina pectoris: Secondary | ICD-10-CM | POA: Diagnosis not present

## 2011-08-02 DIAGNOSIS — M129 Arthropathy, unspecified: Secondary | ICD-10-CM | POA: Diagnosis not present

## 2011-08-02 DIAGNOSIS — Z79899 Other long term (current) drug therapy: Secondary | ICD-10-CM | POA: Diagnosis not present

## 2011-08-02 DIAGNOSIS — R0989 Other specified symptoms and signs involving the circulatory and respiratory systems: Secondary | ICD-10-CM | POA: Insufficient documentation

## 2011-08-02 DIAGNOSIS — I10 Essential (primary) hypertension: Secondary | ICD-10-CM | POA: Diagnosis not present

## 2011-08-02 DIAGNOSIS — Z7982 Long term (current) use of aspirin: Secondary | ICD-10-CM | POA: Diagnosis not present

## 2011-08-02 DIAGNOSIS — Z853 Personal history of malignant neoplasm of breast: Secondary | ICD-10-CM | POA: Diagnosis not present

## 2011-08-02 DIAGNOSIS — R0609 Other forms of dyspnea: Secondary | ICD-10-CM | POA: Insufficient documentation

## 2011-08-02 DIAGNOSIS — E039 Hypothyroidism, unspecified: Secondary | ICD-10-CM | POA: Insufficient documentation

## 2011-08-02 DIAGNOSIS — R071 Chest pain on breathing: Secondary | ICD-10-CM | POA: Diagnosis not present

## 2011-08-02 DIAGNOSIS — I517 Cardiomegaly: Secondary | ICD-10-CM | POA: Diagnosis not present

## 2011-08-02 LAB — POCT I-STAT TROPONIN I
Troponin i, poc: 0.01 ng/mL (ref 0.00–0.08)
Troponin i, poc: 0.01 ng/mL (ref 0.00–0.08)

## 2011-08-02 LAB — CBC
HCT: 30.6 % — ABNORMAL LOW (ref 36.0–46.0)
Hemoglobin: 10.1 g/dL — ABNORMAL LOW (ref 12.0–15.0)
MCH: 27.5 pg (ref 26.0–34.0)
MCV: 83.4 fL (ref 78.0–100.0)
RBC: 3.67 MIL/uL — ABNORMAL LOW (ref 3.87–5.11)

## 2011-08-02 LAB — POCT I-STAT, CHEM 8
BUN: 35 mg/dL — ABNORMAL HIGH (ref 6–23)
Chloride: 106 mEq/L (ref 96–112)
Creatinine, Ser: 0.9 mg/dL (ref 0.50–1.10)
Potassium: 3.9 mEq/L (ref 3.5–5.1)
Sodium: 141 mEq/L (ref 135–145)

## 2011-08-02 MED ORDER — ASPIRIN 81 MG PO CHEW: 324.0000 mg | CHEWABLE_TABLET | Freq: Once | ORAL | Status: DC

## 2011-08-02 MED ORDER — SODIUM CHLORIDE 0.9 % IV SOLN: 1000.0000 mL | INTRAVENOUS | Status: DC

## 2011-08-02 NOTE — ED Notes (Signed)
Unable to move patient to CDU due to the fact that they are full.

## 2011-08-02 NOTE — ED Notes (Signed)
Called pt's son, Loraine Leriche, he is on the way to pick her up.

## 2011-08-02 NOTE — ED Notes (Signed)
PA in with pt 

## 2011-08-02 NOTE — ED Notes (Signed)
IV left FA, 1 nitro, 324 ASA. Per EMS, intermittent chest pain that started today, sharp stabbing, no other associated symptoms.

## 2011-08-02 NOTE — Discharge Instructions (Signed)
Your blood tests today to look for signs of heart damage were normal. Your chest x-ray and EKG were also unchanged from before. This makes your pain today less likely to be coming from your heart. Please make a followup with Dr. Bruna Potter next week if possible for a recheck. If having worsening pain, shortness of breath, or any other worrisome symptoms, please return to the ER.  Chest Pain (Nonspecific) It is often hard to give a specific diagnosis for the cause of chest pain. There is always a chance that your pain could be related to something serious, such as a heart attack or a blood clot in the lungs. You need to follow up with your caregiver for further evaluation. CAUSES   Heartburn.   Pneumonia or bronchitis.   Anxiety and stress.   Inflammation around your heart (pericarditis) or lung (pleuritis or pleurisy).   A blood clot in the lung.   A collapsed lung (pneumothorax). It can develop suddenly on its own (spontaneous pneumothorax) or from injury (trauma) to the chest.  The chest wall is composed of bones, muscles, and cartilage. Any of these can be the source of the pain.  The bones can be bruised by injury.   The muscles or cartilage can be strained by coughing or overwork.   The cartilage can be affected by inflammation and become sore (costochondritis).  DIAGNOSIS  Lab tests or other studies, such as X-rays, an EKG, stress testing, or cardiac imaging, may be needed to find the cause of your pain.  TREATMENT   Treatment depends on what may be causing your chest pain. Treatment may include:   Acid blockers for heartburn.   Anti-inflammatory medicine.   Pain medicine for inflammatory conditions.   Antibiotics if an infection is present.   You may be advised to change lifestyle habits. This includes stopping smoking and avoiding caffeine and chocolate.   You may be advised to keep your head raised (elevated) when sleeping. This reduces the chance of acid going backward  from your stomach into your esophagus.   Most of the time, nonspecific chest pain will improve within 2 to 3 days with rest and mild pain medicine.  HOME CARE INSTRUCTIONS   If antibiotics were prescribed, take the full amount even if you start to feel better.   For the next few days, avoid physical activities that bring on chest pain. Continue physical activities as directed.   Do not smoke cigarettes or drink alcohol until your symptoms are gone.   Only take over-the-counter or prescription medicine for pain, discomfort, or fever as directed by your caregiver.   Follow your caregiver's suggestions for further testing if your chest pain does not go away.   Keep any follow-up appointments you made. If you do not go to an appointment, you could develop lasting (chronic) problems with pain. If there is any problem keeping an appointment, you must call to reschedule.  SEEK MEDICAL CARE IF:   You think you are having problems from the medicine you are taking. Read your medicine instructions carefully.   Your chest pain does not go away, even after treatment.   You develop a rash with blisters on your chest.  SEEK IMMEDIATE MEDICAL CARE IF:   You have increased chest pain or pain that spreads to your arm, neck, jaw, back, or belly (abdomen).   You develop shortness of breath, an increasing cough, or you are coughing up blood.   You have severe back or abdominal pain, feel  sick to your stomach (nauseous) or throw up (vomit).   You develop severe weakness, fainting, or chills.   You have an oral temperature above 102 F (38.9 C), not controlled by medicine.  THIS IS AN EMERGENCY. Do not wait to see if the pain will go away. Get medical help at once. Call your local emergency services (911 in [place] Do not drive yourself to the hospital. MAKE SURE YOU:   Understand these instructions.   Will watch your condition.   Will get help right away if you are not doing well or get worse.    Document Released: 02/26/2005 Document Revised: 01/29/2011 Document Reviewed: 12/23/2007 [facility] Patient Information 2012 [place], [place].

## 2011-08-02 NOTE — ED Notes (Signed)
Reports has felt 2 episodes of CP each lasting 2-3 seconds. Denies pain presently. Respirations even & unlabored, no distress.  No voiced complaints presently. NAD.

## 2011-08-02 NOTE — ED Provider Notes (Signed)
Care of the patient assumed in the CDU from Dr. Fredricka Bonine. 78 year old female presenting from home with episodic chest pain. She states that she was cooking this afternoon when she began to have sharp, stabbing, chest pain at the lower retrosternal area, which lasted for approximately 3 seconds at a time. No associated dyspnea, diaphoresis, nausea, vomiting. She has a past medical history of CABG.  Initial labs were unremarkable. Her ECG was unchanged from previous, and a chest x-ray was unremarkable. She was moved to the CDU for 3 and 6 hour cardiac markers.  On exam: Well-developed well-nourished elderly female. Heart with regular rate and rhythm without murmur. Lungs clear to auscultation bilaterally. No peripheral edema noted.  0, 3, and 6 hour markers were all negative. I discussed with Dr. Fredricka Bonine, who felt the patient was stable for discharge. Findings were discussed with her, and she verbalized understanding. Return precautions were discussed. She was agreeable to plan.  Grant Fontana, [place] 08/02/11 2349

## 2011-08-02 NOTE — ED Notes (Addendum)
States this afternoon while in kitchen sudden onset SSCP describes as sharp pain with episodes lasting approx 1-2 seconds. Denies SOB, n/v, diaphoresis. Reports approx 8 episodes so far since onset..  Reports no change in pain or frequency of CP after NTG given by EMS

## 2011-08-02 NOTE — ED Notes (Signed)
Please call Otelia Limes (son) when it is determined what we are going to do with the patient Number [phone]

## 2011-08-02 NOTE — ED Provider Notes (Signed)
History     CSN: [id number]  Arrival date & time 08/02/11  1538   First MD Initiated Contact with Patient 08/02/11 1541      Chief Complaint  Patient presents with  . Chest Pain    (Consider location/radiation/quality/duration/timing/severity/associated sxs/prior treatment) Patient is a 78 y.o. female presenting with chest pain. The history is provided by the patient, the EMS personnel and medical records.  Chest Pain The chest pain began 1 - 2 hours ago. Duration of episode(s) is 3 seconds. Chest pain occurs intermittently. The chest pain is resolved. Associated with: No associated factors or symptoms, the pain is paroxysmal. At its most intense, the pain is at 8/10. The pain is currently at 0/10. The quality of the pain is described as sharp and stabbing (Located at the lower retrosternal area). The pain does not radiate. Exacerbated by: Nothing. Pertinent negatives for primary symptoms include no fever, no fatigue, no syncope, no shortness of breath, no cough, no wheezing, no palpitations, no abdominal pain, no nausea, no vomiting, no dizziness and no altered mental status.  Pertinent negatives for associated symptoms include no claudication, no diaphoresis, no lower extremity edema, no near-syncope, no numbness, no paroxysmal nocturnal dyspnea and no weakness.  Her past medical history is significant for CAD, hyperlipidemia and hypertension. Past medical history comments: Coronary artery bypass graft     Past Medical History  Diagnosis Date  . IHD (ischemic heart disease)     with CABG in August 2002  . Hypertension   . Hypothyroidism   . Vitamin d deficiency   . Hyperlipidemia   . Anemia   . Hx of CABG     pt states she had 5 bypasses  . Breast cancer 1975    Right sided mastectomy  . Coronary artery disease   . Arthritis     Past Surgical History  Procedure Date  . Coronary artery bypass graft 12/2000    LEFT INTERNAL MAMMARY TO THE LAD, SAPHENOUS VEIN GRAFT TO THE  FIRST DIAGONAL, SAPHENOUS VEIN GRAFT TO THE RAMUS INTERMEDIATE, AND A SEQUENTIAL SAPHENOUS VEIN GRAFT TO THE POSTERIOR DESCENDING AND POSTERIOR LATERAL BRANCHES.  . Mastectomy 1975    RIGHT SIDE  . Cardiovascular stress test Aug 2010    Normal; EF 74%  . Cardiac catheterization 01/20/2001    NORMAL. EF 60-70%  . Tubal ligation     1960's  . Breast surgery     left breast cyst surgery   . Mass excision 04/08/2011    Procedure: EXCISION MASS;  Surgeon: Nicki Reaper, MD;  Location: [facility];  Service: Orthopedics;  Laterality: Left;  excision mass left palm    Family History  Problem Relation Age of Onset  . Alzheimer's disease Mother     History  Substance Use Topics  . Smoking status: Former Smoker -- 1.0 packs/day for 4 years    Types: Cigarettes    Quit date: 06/02/1958  . Smokeless tobacco: Never Used  . Alcohol Use: No    OB History    Grav Para Term Preterm Abortions TAB SAB Ect Mult Living                  Review of Systems  Constitutional: Negative for fever, chills, diaphoresis, activity change, appetite change and fatigue.  HENT: Negative for congestion, facial swelling, rhinorrhea, neck pain, neck stiffness and postnasal drip.   Eyes: Negative.   Respiratory: Negative for cough, choking, chest tightness, shortness of breath and wheezing.  Dyspnea on exertion  Cardiovascular: Positive for chest pain. Negative for palpitations, claudication, leg swelling, syncope and near-syncope.  Gastrointestinal: Negative for nausea, vomiting, abdominal pain and abdominal distention.  Musculoskeletal: Negative.   Skin: Negative.   Neurological: Negative for dizziness, syncope, speech difficulty, weakness, light-headedness, numbness and headaches.  Hematological: Does not bruise/bleed easily.  Psychiatric/Behavioral: Negative.  Negative for altered mental status.    Allergies  Valium; Crestor; Doxycycline; Lipitor; Sulfa drugs cross reactors; Tequin;  Tylenol; Vytorin; and Zocor  Home Medications   Current Outpatient Rx  Name Route Sig Dispense Refill  . ACETAMINOPHEN ER 650 MG PO TBCR Oral Take 1,300 mg by mouth 2 (two) times a week.     . ACETAMINOPHEN 500 MG PO TABS Oral Take 500 mg by mouth every 6 (six) hours as needed.      . ASPIRIN 81 MG PO TABS Oral Take 81 mg by mouth daily. 2 DAILY    . LEVOTHYROXINE SODIUM 50 MCG PO TABS Oral Take 50 mcg by mouth daily.     Marland Kitchen NAPROXEN SODIUM 220 MG PO TABS Oral Take 220 mg by mouth as needed.     Frazier Butt OP Ophthalmic Apply to eye daily.     Marland Kitchen POLYETHYLENE GLYCOL 3350 PO PACK Oral Take 17 g by mouth as needed.     Marland Kitchen PRAVASTATIN SODIUM 40 MG PO TABS Oral Take 1 tablet (40 mg total) by mouth daily. 30 tablet 6  . RAMIPRIL 10 MG PO TABS Oral Take 1 tablet (10 mg total) by mouth daily. 90 tablet 1  . GAS-X PO Oral Take by mouth as needed.     . TRIAMTERENE-HCTZ 37.5-25 MG PO TABS Oral Take 1 tablet by mouth daily.       BP 153/56  Pulse 71  Temp(Src) 98.6 F (37 C) (Oral)  Resp 16  SpO2 99%  Physical Exam  Nursing note and vitals reviewed. Constitutional: She is oriented to person, place, and time. She appears well-developed and well-nourished. No distress.  HENT:  Head: Normocephalic and atraumatic.  Mouth/Throat: Oropharynx is clear and moist.  Eyes: EOM are normal. Pupils are equal, round, and reactive to light.  Neck: Normal range of motion. Neck supple. No JVD present. No tracheal deviation present.  Cardiovascular: Normal rate, regular rhythm, normal heart sounds and intact distal pulses.   No extrasystoles are present. Exam reveals no gallop, no S3, no S4 and no friction rub.   No murmur heard. Pulmonary/Chest: Breath sounds normal. No accessory muscle usage or stridor. Not tachypneic. No respiratory distress. She has no wheezes. She has no rales. She exhibits no tenderness.  Abdominal: Soft. Bowel sounds are normal. She exhibits no distension. There is no tenderness.    Musculoskeletal: Normal range of motion. She exhibits no edema and no tenderness.  Neurological: She is alert and oriented to person, place, and time. She has normal reflexes. No cranial nerve deficit. Coordination normal.  Skin: Skin is warm and dry. No rash noted. She is not diaphoretic. No erythema. No pallor.  Psychiatric: She has a normal mood and affect. Her behavior is normal. Judgment and thought content normal.    ED Course  Procedures (including critical care time)   Date: 08/02/2011  Rate: 65  Rhythm: normal sinus rhythm  QRS Axis: normal  Intervals: normal  ST/T Wave abnormalities: T wave inversions in leads V1, V2, V3, redemonstrated from prior EKG and unchanged  Conduction Disutrbances:none  Narrative Interpretation: Non-provocative EKG  Old EKG Reviewed: unchanged  Labs Reviewed - No data to display No results found.   No diagnosis found.    MDM  Musculoskeletal chest pain, costochondritis, GERD, Gastrointestinal Chest Pain, Pleuritic Chest Pain, Pneumonia, Pneumothorax, Pulmonary Embolism, Esophageal Spasm, Arrhythmia considered among other potential etiologies in the patient's differential diagnosis. I do not strongly suspect acute coronary syndrome or myocardial infarction or ischemia as the cause of the patient's symptoms as they are very atypical symptoms with pain lasting only 2-3 seconds at a time, coming paroxysmally, with no associated shortness of breath, nausea, diaphoresis. The patient does have a history of significant coronary artery disease but these symptoms seem atypical. I will obtain a six-hour cardiac marker to exclude myocardial ischemia.        Felisa Bonier, MD 08/02/11 [phone]

## 2011-08-03 NOTE — ED Notes (Signed)
Pt verbalized understanding of dc instructions.

## 2011-08-04 ENCOUNTER — Other Ambulatory Visit: Payer: Self-pay

## 2011-08-04 ENCOUNTER — Emergency Department (HOSPITAL_COMMUNITY): Payer: Medicare Other

## 2011-08-04 ENCOUNTER — Encounter (HOSPITAL_COMMUNITY): Payer: Self-pay | Admitting: *Deleted

## 2011-08-04 ENCOUNTER — Emergency Department (HOSPITAL_COMMUNITY)
Admission: EM | Admit: 2011-08-04 | Discharge: 2011-08-04 | Disposition: A | Discharge status: Home / Self Care | Payer: Medicare Other | Attending: Emergency Medicine | Admitting: Emergency Medicine

## 2011-08-04 DIAGNOSIS — E039 Hypothyroidism, unspecified: Secondary | ICD-10-CM | POA: Insufficient documentation

## 2011-08-04 DIAGNOSIS — R42 Dizziness and giddiness: Secondary | ICD-10-CM | POA: Insufficient documentation

## 2011-08-04 DIAGNOSIS — Z853 Personal history of malignant neoplasm of breast: Secondary | ICD-10-CM | POA: Insufficient documentation

## 2011-08-04 DIAGNOSIS — Z79899 Other long term (current) drug therapy: Secondary | ICD-10-CM | POA: Insufficient documentation

## 2011-08-04 DIAGNOSIS — I10 Essential (primary) hypertension: Secondary | ICD-10-CM | POA: Diagnosis not present

## 2011-08-04 DIAGNOSIS — K5289 Other specified noninfective gastroenteritis and colitis: Secondary | ICD-10-CM | POA: Diagnosis not present

## 2011-08-04 DIAGNOSIS — I25810 Atherosclerosis of coronary artery bypass graft(s) without angina pectoris: Secondary | ICD-10-CM | POA: Insufficient documentation

## 2011-08-04 DIAGNOSIS — I6789 Other cerebrovascular disease: Secondary | ICD-10-CM | POA: Diagnosis not present

## 2011-08-04 DIAGNOSIS — R109 Unspecified abdominal pain: Secondary | ICD-10-CM | POA: Insufficient documentation

## 2011-08-04 DIAGNOSIS — K5730 Diverticulosis of large intestine without perforation or abscess without bleeding: Secondary | ICD-10-CM | POA: Diagnosis not present

## 2011-08-04 DIAGNOSIS — I498 Other specified cardiac arrhythmias: Secondary | ICD-10-CM | POA: Diagnosis not present

## 2011-08-04 DIAGNOSIS — R1110 Vomiting, unspecified: Secondary | ICD-10-CM | POA: Diagnosis not present

## 2011-08-04 DIAGNOSIS — R112 Nausea with vomiting, unspecified: Secondary | ICD-10-CM | POA: Diagnosis not present

## 2011-08-04 DIAGNOSIS — K5732 Diverticulitis of large intestine without perforation or abscess without bleeding: Secondary | ICD-10-CM | POA: Insufficient documentation

## 2011-08-04 DIAGNOSIS — R5381 Other malaise: Secondary | ICD-10-CM | POA: Diagnosis not present

## 2011-08-04 DIAGNOSIS — R079 Chest pain, unspecified: Secondary | ICD-10-CM | POA: Insufficient documentation

## 2011-08-04 LAB — COMPREHENSIVE METABOLIC PANEL
ALT: 14 U/L (ref 0–35)
AST: 20 U/L (ref 0–37)
Calcium: 10.6 mg/dL — ABNORMAL HIGH (ref 8.4–10.5)
Creatinine, Ser: 0.93 mg/dL (ref 0.50–1.10)
GFR calc Af Amer: 64 mL/min — ABNORMAL LOW (ref >90)
Glucose, Bld: 107 mg/dL — ABNORMAL HIGH (ref 70–99)
Sodium: 137 mEq/L (ref 135–145)
Total Protein: 8.1 g/dL (ref 6.0–8.3)

## 2011-08-04 LAB — DIFFERENTIAL
Basophils Absolute: 0 10*3/uL (ref 0.0–0.1)
Eosinophils Absolute: 0.1 10*3/uL (ref 0.0–0.7)
Eosinophils Relative: 1 % (ref 0–5)
Monocytes Absolute: 0.2 10*3/uL (ref 0.1–1.0)

## 2011-08-04 LAB — URINALYSIS, ROUTINE W REFLEX MICROSCOPIC
Bilirubin Urine: NEGATIVE
Nitrite: NEGATIVE
Specific Gravity, Urine: 1.02 (ref 1.005–1.030)
pH: 6 (ref 5.0–8.0)

## 2011-08-04 LAB — POCT I-STAT TROPONIN I: Troponin i, poc: 0 ng/mL (ref 0.00–0.08)

## 2011-08-04 LAB — CBC
MCH: 27.5 pg (ref 26.0–34.0)
MCV: 83.7 fL (ref 78.0–100.0)
Platelets: 162 10*3/uL (ref 150–400)
RDW: 14 % (ref 11.5–15.5)

## 2011-08-04 LAB — OCCULT BLOOD, POC DEVICE: Fecal Occult Bld: NEGATIVE

## 2011-08-04 MED ORDER — ONDANSETRON HCL 4 MG/2ML IJ SOLN
4.0000 mg | Freq: Once | INTRAMUSCULAR | Status: AC
  Administered 2011-08-04: 4 mg via INTRAVENOUS
  Filled 2011-08-04: qty 2

## 2011-08-04 MED ORDER — SODIUM CHLORIDE 0.9 % IV SOLN
Freq: Once | INTRAVENOUS | Status: AC
  Administered 2011-08-04: 500 mL via INTRAVENOUS

## 2011-08-04 MED ORDER — METRONIDAZOLE 500 MG PO TABS: 500.0000 mg | ORAL_TABLET | Freq: Two times a day (BID) | ORAL | Status: AC

## 2011-08-04 MED ORDER — ONDANSETRON HCL 4 MG/2ML IJ SOLN
INTRAMUSCULAR | Status: AC
  Filled 2011-08-04: qty 2

## 2011-08-04 MED ORDER — ONDANSETRON HCL 4 MG/2ML IJ SOLN
4.0000 mg | Freq: Once | INTRAMUSCULAR | Status: AC
  Administered 2011-08-04: 4 mg via INTRAVENOUS

## 2011-08-04 MED ORDER — IOHEXOL 300 MG/ML  SOLN
100.0000 mL | Freq: Once | INTRAMUSCULAR | Status: AC | PRN
  Administered 2011-08-04: 100 mL via INTRAVENOUS

## 2011-08-04 MED ORDER — SODIUM CHLORIDE 0.9 % IV BOLUS (SEPSIS)
1000.0000 mL | Freq: Once | INTRAVENOUS | Status: AC
  Administered 2011-08-04: 1000 mL via INTRAVENOUS

## 2011-08-04 MED ORDER — ONDANSETRON HCL 4 MG PO TABS: 4.0000 mg | ORAL_TABLET | Freq: Four times a day (QID) | ORAL | Status: AC

## 2011-08-04 MED ORDER — CIPROFLOXACIN HCL 500 MG PO TABS: 500.0000 mg | ORAL_TABLET | Freq: Two times a day (BID) | ORAL | Status: AC

## 2011-08-04 NOTE — ED Notes (Signed)
Natalie Bullock [phone] pt son

## 2011-08-04 NOTE — ED Notes (Signed)
Per EMS- pt in after history of constipation last week, taken several doses of miralax at home and today noted loose stools and vomiting, denies pain

## 2011-08-04 NOTE — ED Notes (Signed)
Pt called out to nursing desk c/o return of nausea, order for zofran from PA received, upon entering room to administer medication, pt noted to be pale, HR on monitor at 45 in sinus brady, pt with speech slurred and states she feels dizzy with blurred vision, BP 69/40- PA Bowie to bedside to evaluate patient, told to hold zofran until EKG evaluation. Pt placed in trendelenburg and turned onto side due to continued nausea. IV fluid bolus initiate as ordered. PA Greta Doom returned to room with MD Tucich, states ok to administer zofran, pt HR increased to 60, BP rechecked at 120/57, pt states nausea began to improve at that time. Orders for further evaluation received. EKG to be repeated.

## 2011-08-04 NOTE — ED Notes (Signed)
Pt tolerated PO fluids, PA aware, pt awaiting test results

## 2011-08-04 NOTE — ED Notes (Signed)
Pt color improved, states nausea has improved, pt remains on side with head lowered, VS stable at this time, EKG being repeated at this time. MD aware of patient status

## 2011-08-04 NOTE — ED Notes (Signed)
Returned from CT scan.

## 2011-08-04 NOTE — ED Provider Notes (Signed)
2:31 PM   Date: 08/04/2011     14:26  Rate: 71  Rhythm: normal sinus rhythm  QRS Axis: normal  Intervals: normal  ST/T Wave abnormalities: nonspecific T wave changes  Conduction Disutrbances:none  Narrative Interpretation:   Old EKG Reviewed: changes noted  Evaluated the patient in the room, after an episode of dizziness, hypotension and bradycardia. Felt likely secondary to vasovagal episode. While in the room. Blood pressure was back up to 120/60. Heart rate in the 70s. Guaiac rectal exam done by the mid level provider, which was negative for any occult bleeding.  Will obtain CT abdomen and pelvis for continued symptoms. Also, obtaining CT of the head to rule out any intracranial causes of her dizziness or vomiting.    Initial studies are reassuring with a normal urine. Initial troponin is 0.00. Electrolytes, and CO2 were normal. Patient's BUN was 24, but creatinine was normal at 0.9. 3. LFTs were normal. White blood cell count was normal at 6.8, hemoglobin 11.5.  Initial abdominal and chest x-rays showed no evidence of any acute pulmonary disease. No pertinent polyp structure or free air.   Will continue treatment for her dizziness and nausea with IV fluids and antiemetics. Disposition will be pending reassessment and CT scan studies. Also rechecking i-STAT troponin. Second EKG showed no significant changes from the first.      Theron Arista A. Patrica Duel, MD 08/04/11 1434

## 2011-08-04 NOTE — ED Notes (Signed)
Notified patient that i needed a urine sample she stated that she needs a little time

## 2011-08-04 NOTE — ED Notes (Signed)
VHQ:[id number]<EX> Expected date:08/04/11<BR> Expected time:10:41 AM<BR> Means of arrival:Ambulance<BR> Comments:<BR> M60. 78 yo f. Sick, vomiting, diarrhea. 15 mins

## 2011-08-04 NOTE — ED Notes (Signed)
Pt ambulated to BR without difficulty

## 2011-08-04 NOTE — ED Provider Notes (Signed)
History     CSN: [id number]  Arrival date & time 08/04/11  1051   First MD Initiated Contact with Patient 08/04/11 1100      Chief Complaint  Patient presents with  . Emesis    (Consider location/radiation/quality/duration/timing/severity/associated sxs/prior treatment) HPI  78 year old female presents with the chief complaints of vomiting. Patient states for the past several weeks she has been experiencing constipation. She usually takes milk of magnesia for constipation. A week ago she called her primary care Dr. to complain about her constipation and she was recommended to take MiraLAX. Patient states she takes MiraLAX x 1 week but the constipation did not improve. She then switched back to her regular milk of magnesia yesterday. This morning she had 3 bouts of diarrhea. Patient also felt nauseated this a.m. and proceeds to vomit once in the bathroom. After vomiting she felt dizzy and fall to the floor. She denies hitting her head or loss of consciousness. She lives in a nursing facility and she was able to use the call bell to get help. Staff member called EMS to bring her to the ED. The current time she denies any headache, neck pain, chest pain, shortness of breath, abdominal pain, or dysuria. She denies any other injuries.   Past Medical History  Diagnosis Date  . IHD (ischemic heart disease)     with CABG in August 2002  . Hypertension   . Hypothyroidism   . Vitamin d deficiency   . Hyperlipidemia   . Anemia   . Hx of CABG     pt states she had 5 bypasses  . Breast cancer 1975    Right sided mastectomy  . Coronary artery disease   . Arthritis     Past Surgical History  Procedure Date  . Coronary artery bypass graft 12/2000    LEFT INTERNAL MAMMARY TO THE LAD, SAPHENOUS VEIN GRAFT TO THE FIRST DIAGONAL, SAPHENOUS VEIN GRAFT TO THE RAMUS INTERMEDIATE, AND A SEQUENTIAL SAPHENOUS VEIN GRAFT TO THE POSTERIOR DESCENDING AND POSTERIOR LATERAL BRANCHES.  . Mastectomy 1975   RIGHT SIDE  . Cardiovascular stress test Aug 2010    Normal; EF 74%  . Cardiac catheterization 01/20/2001    NORMAL. EF 60-70%  . Tubal ligation     1960's  . Breast surgery     left breast cyst surgery   . Mass excision 04/08/2011    Procedure: EXCISION MASS;  Surgeon: Nicki Reaper, MD;  Location: [facility];  Service: Orthopedics;  Laterality: Left;  excision mass left palm    Family History  Problem Relation Age of Onset  . Alzheimer's disease Mother     History  Substance Use Topics  . Smoking status: Former Smoker -- 1.0 packs/day for 4 years    Types: Cigarettes    Quit date: 06/02/1958  . Smokeless tobacco: Never Used  . Alcohol Use: No    OB History    Grav Para Term Preterm Abortions TAB SAB Ect Mult Living                  Review of Systems  All other systems reviewed and are negative.    Allergies  Valium; Crestor; Doxycycline; Lipitor; Sulfa drugs cross reactors; Tequin; Tylenol; Vytorin; and Zocor  Home Medications   Current Outpatient Rx  Name Route Sig Dispense Refill  . ACETAMINOPHEN ER 650 MG PO TBCR Oral Take 1,300 mg by mouth 2 (two) times a week.     . ACETAMINOPHEN 500  MG PO TABS Oral Take 500 mg by mouth every 6 (six) hours as needed. For pain    . ASPIRIN 81 MG PO TABS Oral Take 162 mg by mouth daily.     Marland Kitchen LEVOTHYROXINE SODIUM 50 MCG PO TABS Oral Take 50 mcg by mouth daily.     Marland Kitchen MAGNESIUM HYDROXIDE 400 MG/5ML PO SUSP Oral Take 30 mLs by mouth daily as needed. For constipation    . SYSTANE OP Ophthalmic Apply 2 drops to eye daily.     Marland Kitchen PRAVASTATIN SODIUM 40 MG PO TABS Oral Take 40 mg by mouth daily.    Marland Kitchen RAMIPRIL 10 MG PO TABS Oral Take 10 mg by mouth daily.    Marland Kitchen GAS-X PO Oral Take 1 capsule by mouth daily as needed. For bloating    . TRIAMTERENE-HCTZ 37.5-25 MG PO TABS Oral Take 1 tablet by mouth daily.       There were no vitals taken for this visit.  Physical Exam  Nursing note and vitals reviewed. Constitutional:  She appears well-developed and well-nourished. No distress.       Awake, alert, nontoxic appearance  HENT:  Head: Atraumatic.  Eyes: Conjunctivae are normal. Right eye exhibits no discharge. Left eye exhibits no discharge.  Neck: Neck supple.  Cardiovascular: Normal rate and regular rhythm.   Pulmonary/Chest: Effort normal. No respiratory distress. She exhibits no tenderness.  Abdominal: Soft. There is no tenderness. There is no rebound.  Musculoskeletal: She exhibits no tenderness.       ROM appears intact, no obvious focal weakness  Neurological:       Mental status and motor strength appears intact  Skin: No rash noted.  Psychiatric: She has a normal mood and affect.    ED Course  Procedures (including critical care time)  Labs Reviewed - No data to display Dg Chest 2 View  08/02/2011  *RADIOLOGY REPORT*  Clinical Data: Shooting pains of the mid chest.  Began this morning.  History of heart attack and five vessel bypass.  No shortness of breath.  CHEST - 2 VIEW  Comparison: Chest x-ray 03/01/2011  Findings: The patient has had median sternotomy CABG.  Heart is enlarged.  No focal consolidations or pleural effusions.  Mild atelectasis or scar left lung base.  No edema.  Degenerative changes are seen in the spine.  IMPRESSION:  1.  Postoperative changes. 2.  Cardiomegaly without pulmonary edema.  Original Report Authenticated By: Patterson Hammersmith, M.D.     No diagnosis found.   Date: 08/04/2011  Rate: 72  Rhythm: normal sinus rhythm  QRS Axis: left  Intervals: normal  ST/T Wave abnormalities: normal  Conduction Disutrbances:none  Narrative Interpretation: probable LVH  Old EKG Reviewed: unchanged   Results for orders placed during the hospital encounter of 08/04/11  CBC      Component Value Range   WBC 6.8  4.0 - 10.5 (K/uL)   RBC 4.18  3.87 - 5.11 (MIL/uL)   Hemoglobin 11.5 (*) 12.0 - 15.0 (g/dL)   HCT 56.2 (*) 13.0 - 46.0 (%)   MCV 83.7  78.0 - 100.0 (fL)   MCH 27.5   26.0 - 34.0 (pg)   MCHC 32.9  30.0 - 36.0 (g/dL)   RDW 86.5  78.4 - 69.6 (%)   Platelets 162  150 - 400 (K/uL)  DIFFERENTIAL      Component Value Range   Neutrophils Relative 90 (*) 43 - 77 (%)   Neutro Abs 6.1  1.7 - 7.7 (  K/uL)   Lymphocytes Relative 5 (*) 12 - 46 (%)   Lymphs Abs 0.4 (*) 0.7 - 4.0 (K/uL)   Monocytes Relative 3  3 - 12 (%)   Monocytes Absolute 0.2  0.1 - 1.0 (K/uL)   Eosinophils Relative 1  0 - 5 (%)   Eosinophils Absolute 0.1  0.0 - 0.7 (K/uL)   Basophils Relative 0  0 - 1 (%)   Basophils Absolute 0.0  0.0 - 0.1 (K/uL)  COMPREHENSIVE METABOLIC PANEL      Component Value Range   Sodium 137  135 - 145 (mEq/L)   Potassium 3.9  3.5 - 5.1 (mEq/L)   Chloride 99  96 - 112 (mEq/L)   CO2 28  19 - 32 (mEq/L)   Glucose, Bld 107 (*) 70 - 99 (mg/dL)   BUN 24 (*) 6 - 23 (mg/dL)   Creatinine, Ser 1.61  0.50 - 1.10 (mg/dL)   Calcium 09.6 (*) 8.4 - 10.5 (mg/dL)   Total Protein 8.1  6.0 - 8.3 (g/dL)   Albumin 4.2  3.5 - 5.2 (g/dL)   AST 20  0 - 37 (U/L)   ALT 14  0 - 35 (U/L)   Alkaline Phosphatase 51  39 - 117 (U/L)   Total Bilirubin 0.4  0.3 - 1.2 (mg/dL)   GFR calc non Af Amer 55 (*) >90 (mL/min)   GFR calc Af Amer 64 (*) >90 (mL/min)  POCT I-STAT TROPONIN I      Component Value Range   Troponin i, poc 0.00  0.00 - 0.08 (ng/mL)   Comment 3            Dg Chest 2 View  08/02/2011  *RADIOLOGY REPORT*  Clinical Data: Shooting pains of the mid chest.  Began this morning.  History of heart attack and five vessel bypass.  No shortness of breath.  CHEST - 2 VIEW  Comparison: Chest x-ray 03/01/2011  Findings: The patient has had median sternotomy CABG.  Heart is enlarged.  No focal consolidations or pleural effusions.  Mild atelectasis or scar left lung base.  No edema.  Degenerative changes are seen in the spine.  IMPRESSION:  1.  Postoperative changes. 2.  Cardiomegaly without pulmonary edema.  Original Report Authenticated By: Patterson Hammersmith, M.D.   Dg Abd Acute  W/chest  08/04/2011  *RADIOLOGY REPORT*  Clinical Data: Nausea/vomiting, weakness  ACUTE ABDOMEN SERIES (ABDOMEN 2 VIEW & CHEST 1 VIEW)  Comparison: Chest radiograph dated 08/02/2011  Findings: Lungs are essentially clear. No pleural effusion or pneumothorax.  The heart is top normal in size. Postsurgical changes related to prior CABG.  Paucity of bowel gas, without disproportionate small bowel dilatation to suggest small bowel obstruction.  No evidence of free air under the diaphragm on the upright view.  Calcified uterine fibroid in the right pelvis.  Degenerative changes of the visualized thoracolumbar spine.  IMPRESSION: No evidence of acute cardiopulmonary disease.  No evidence of small bowel obstruction or free air.  Original Report Authenticated By: Charline Bills, M.D.   Results for orders placed during the hospital encounter of 08/04/11  CBC      Component Value Range   WBC 6.8  4.0 - 10.5 (K/uL)   RBC 4.18  3.87 - 5.11 (MIL/uL)   Hemoglobin 11.5 (*) 12.0 - 15.0 (g/dL)   HCT 04.5 (*) 40.9 - 46.0 (%)   MCV 83.7  78.0 - 100.0 (fL)   MCH 27.5  26.0 - 34.0 (pg)   MCHC 32.9  30.0 - 36.0 (g/dL)   RDW 16.1  09.6 - 04.5 (%)   Platelets 162  150 - 400 (K/uL)  DIFFERENTIAL      Component Value Range   Neutrophils Relative 90 (*) 43 - 77 (%)   Neutro Abs 6.1  1.7 - 7.7 (K/uL)   Lymphocytes Relative 5 (*) 12 - 46 (%)   Lymphs Abs 0.4 (*) 0.7 - 4.0 (K/uL)   Monocytes Relative 3  3 - 12 (%)   Monocytes Absolute 0.2  0.1 - 1.0 (K/uL)   Eosinophils Relative 1  0 - 5 (%)   Eosinophils Absolute 0.1  0.0 - 0.7 (K/uL)   Basophils Relative 0  0 - 1 (%)   Basophils Absolute 0.0  0.0 - 0.1 (K/uL)  COMPREHENSIVE METABOLIC PANEL      Component Value Range   Sodium 137  135 - 145 (mEq/L)   Potassium 3.9  3.5 - 5.1 (mEq/L)   Chloride 99  96 - 112 (mEq/L)   CO2 28  19 - 32 (mEq/L)   Glucose, Bld 107 (*) 70 - 99 (mg/dL)   BUN 24 (*) 6 - 23 (mg/dL)   Creatinine, Ser 4.09  0.50 - 1.10 (mg/dL)    Calcium 81.1 (*) 8.4 - 10.5 (mg/dL)   Total Protein 8.1  6.0 - 8.3 (g/dL)   Albumin 4.2  3.5 - 5.2 (g/dL)   AST 20  0 - 37 (U/L)   ALT 14  0 - 35 (U/L)   Alkaline Phosphatase 51  39 - 117 (U/L)   Total Bilirubin 0.4  0.3 - 1.2 (mg/dL)   GFR calc non Af Amer 55 (*) >90 (mL/min)   GFR calc Af Amer 64 (*) >90 (mL/min)  URINALYSIS, ROUTINE W REFLEX MICROSCOPIC      Component Value Range   Color, Urine YELLOW  YELLOW    APPearance CLEAR  CLEAR    Specific Gravity, Urine 1.020  1.005 - 1.030    pH 6.0  5.0 - 8.0    Glucose, UA NEGATIVE  NEGATIVE (mg/dL)   Hgb urine dipstick NEGATIVE  NEGATIVE    Bilirubin Urine NEGATIVE  NEGATIVE    Ketones, ur NEGATIVE  NEGATIVE (mg/dL)   Protein, ur NEGATIVE  NEGATIVE (mg/dL)   Urobilinogen, UA 0.2  0.0 - 1.0 (mg/dL)   Nitrite NEGATIVE  NEGATIVE    Leukocytes, UA NEGATIVE  NEGATIVE   POCT I-STAT TROPONIN I      Component Value Range   Troponin i, poc 0.00  0.00 - 0.08 (ng/mL)   Comment 3           OCCULT BLOOD, POC DEVICE      Component Value Range   Fecal Occult Bld NEGATIVE     Dg Chest 2 View  08/02/2011  *RADIOLOGY REPORT*  Clinical Data: Shooting pains of the mid chest.  Began this morning.  History of heart attack and five vessel bypass.  No shortness of breath.  CHEST - 2 VIEW  Comparison: Chest x-ray 03/01/2011  Findings: The patient has had median sternotomy CABG.  Heart is enlarged.  No focal consolidations or pleural effusions.  Mild atelectasis or scar left lung base.  No edema.  Degenerative changes are seen in the spine.  IMPRESSION:  1.  Postoperative changes. 2.  Cardiomegaly without pulmonary edema.  Original Report Authenticated By: Patterson Hammersmith, M.D.   Ct Head Wo Contrast  08/04/2011  *RADIOLOGY REPORT*  Clinical Data: Dizziness with vomiting.  CT HEAD WITHOUT  CONTRAST  Technique:  Contiguous axial images were obtained from the base of the skull through the vertex without contrast.  Comparison: 11/17/2009  Findings: There is no  evidence for acute hemorrhage, hydrocephalus, mass lesion, or abnormal extra-axial fluid collection.  No definite CT evidence for acute infarction.  Patchy low attenuation in the deep hemispheric and periventricular white matter is nonspecific, but likely reflects chronic microvascular ischemic demyelination. The visualized paranasal sinuses and mastoid air cells are clear.  IMPRESSION: Stable.  Chronic small vessel white matter disease without acute intracranial findings.  Original Report Authenticated By: ERIC A. MANSELL, M.D.   Ct Abdomen Pelvis W Contrast  08/04/2011  *RADIOLOGY REPORT*  Clinical Data: Vomiting and abdominal pain.  CT ABDOMEN AND PELVIS WITH CONTRAST  Technique:  Multidetector CT imaging of the abdomen and pelvis was performed following the standard protocol during bolus administration of intravenous contrast.  Contrast: OMNIPAQUE IOHEXOL 300 MG/ML IJ SOLN  Comparison: None.  Findings: There is some bronchiectasis with probable atelectasis in the posterior lung bases bilaterally.  No focal abnormalities seen in the liver or spleen.  The small hiatal hernia noted.  Stomach is otherwise unremarkable.  The duodenum, pancreas, and adrenal glands are unremarkable.  Layering calcified stones are seen in the lumen of the gallbladder.  There is no intra or extrahepatic biliary duct dilatation.  Tiny low-density cortical lesions identified in each kidney, likely representing cysts.  No abdominal aortic aneurysm.  There is no free fluid or lymphadenopathy in the abdomen.  No evidence for bowel obstruction in the abdomen.  Imaging through the pelvis shows no free intraperitoneal fluid.  No pelvic sidewall lymphadenopathy.  Bladder is unremarkable.  Fibroid changes noted in the uterus.  There is no adnexal mass.  Prominent diverticular changes are seen in the sigmoid colon. There is some edema and / or inflammation along the mid sigmoid colon suggesting diverticulitis.  No evidence for perforation  or abscess at this time.  The colonic lumen is diffusely fluid-filled, down to the level of the rectum, suggesting diarrhea.  Terminal ileum is normal.  The appendix is normal.  A left hip lipoma is seen within the gluteus musculature.  Bone windows show a medullary lesion in the proximal left femur.  IMPRESSION: Advanced diverticulosis in the left colon with edema/inflammation adjacent to the midsigmoid segment suggesting diverticulitis.  No evidence for perforation or abscess at this time.  Follow-up is recommended as colonic neoplasm can present with similar imaging features.  Large lucent lesion in the proximal left femur.  This was visualized on a plain film exam from 01/01/2006 and the visualized portion is unchanged.  This interval stability is consistent with benign process.  Original Report Authenticated By: ERIC A. MANSELL, M.D.   Dg Abd Acute W/chest  08/04/2011  *RADIOLOGY REPORT*  Clinical Data: Nausea/vomiting, weakness  ACUTE ABDOMEN SERIES (ABDOMEN 2 VIEW & CHEST 1 VIEW)  Comparison: Chest radiograph dated 08/02/2011  Findings: Lungs are essentially clear. No pleural effusion or pneumothorax.  The heart is top normal in size. Postsurgical changes related to prior CABG.  Paucity of bowel gas, without disproportionate small bowel dilatation to suggest small bowel obstruction.  No evidence of free air under the diaphragm on the upright view.  Calcified uterine fibroid in the right pelvis.  Degenerative changes of the visualized thoracolumbar spine.  IMPRESSION: No evidence of acute cardiopulmonary disease.  No evidence of small bowel obstruction or free air.  Original Report Authenticated By: Charline Bills, M.D.  MDM  Diarrhea and vomiting after taking milk of magnesia. Patient is in no acute distress. Abdomen is nontender on exam.  I will obtain belly labs, KUB, and urinalysis. Ordered EKG. IV fluid given. Zofran given. I will continue to monitor patient.   1:09 PM EKG shows no  changes. Normal troponin. Her electrolytes are at baseline. She has no an elevated white count. Her acute abdomen series shows no evidence of obstructions or free air. I do believe her symptoms are secondary to taking laxative.  Care discussed with my attending.    2:32 PM Pt went to bathroom to urinate.  She got back to her bed and subsequently felt nauseated, and lightheadedness.  Vital sign were obtained at that time which shows hypotensive and bradycardic.  ECG shows no changes, hemoccult negative.  Pt were put in [place] position, attending notified.  Her VS normalize shortly afterward.  Zofran given.  Initial lab work shows no acute finding.  We'll obtain Head and abdominal CT scan for further evaluation.  IVF continues.    6:09 PM Patient's head CT scan was unremarkable. Her abdominal CT scan shows evidence of advanced diverticulosis left colon with evidence suggesting diverticulitis. No recommendation for followup as colonic neoplasm and presents with similar presentation. Patient does have a remote history of breast cancer, therefore she is at increased risk for neoplasm. However, in this setting patient will be treated for diverticulitis with instruction to followup with the primary care Dr. for further evaluation.  Results were discussed with the patient, and she voiced understanding. Patient requests to be discharged. She is in no acute distress at this time.  Fayrene Helper, PA-C 08/04/11 1819

## 2011-08-05 NOTE — ED Provider Notes (Signed)
Medical screening examination/treatment/procedure(s) were performed by non-physician practitioner and as supervising physician I was immediately available for consultation/collaboration.   Nat Christen, MD 08/05/11 1210

## 2011-08-06 ENCOUNTER — Telehealth: Payer: Self-pay | Admitting: Cardiology

## 2011-08-06 NOTE — Telephone Encounter (Signed)
Spoke with pt, she reports dr blunt told her to call and let us know about her two episodes in the ER. The first was noncardiac chest pain and the second was diverticulitis. She has a follow up with dr blunt and explained to the pt he could take care of these non-cardiac related issues.

## 2011-08-06 NOTE — ED Provider Notes (Signed)
Evaluation and management procedures were performed by the PA/NP under my supervision/collaboration.    Felisa Bonier, MD 08/06/11 2030

## 2011-08-06 NOTE — Telephone Encounter (Signed)
New Msg: Pt calling wanting to speak with Stanton Kidney regarding some information from pt PCP to inform Dr. Jens Som of. Please return pt call to discuss further.

## 2011-08-07 DIAGNOSIS — Z1231 Encounter for screening mammogram for malignant neoplasm of breast: Secondary | ICD-10-CM | POA: Diagnosis not present

## 2011-08-14 ENCOUNTER — Emergency Department (HOSPITAL_COMMUNITY): Payer: Medicare Other

## 2011-08-14 ENCOUNTER — Inpatient Hospital Stay (HOSPITAL_COMMUNITY)
Admission: EM | Admit: 2011-08-14 | Discharge: 2011-08-18 | DRG: 640 | Disposition: A | Discharge status: Skilled Nursing Facility | Payer: Medicare Other | Attending: Internal Medicine | Admitting: Internal Medicine

## 2011-08-14 ENCOUNTER — Other Ambulatory Visit: Payer: Self-pay

## 2011-08-14 ENCOUNTER — Telehealth: Payer: Self-pay | Admitting: Cardiology

## 2011-08-14 ENCOUNTER — Encounter (HOSPITAL_COMMUNITY): Payer: Self-pay

## 2011-08-14 DIAGNOSIS — I10 Essential (primary) hypertension: Secondary | ICD-10-CM | POA: Diagnosis not present

## 2011-08-14 DIAGNOSIS — R42 Dizziness and giddiness: Secondary | ICD-10-CM | POA: Diagnosis not present

## 2011-08-14 DIAGNOSIS — Z66 Do not resuscitate: Secondary | ICD-10-CM | POA: Diagnosis present

## 2011-08-14 DIAGNOSIS — E871 Hypo-osmolality and hyponatremia: Secondary | ICD-10-CM | POA: Diagnosis not present

## 2011-08-14 DIAGNOSIS — E041 Nontoxic single thyroid nodule: Secondary | ICD-10-CM

## 2011-08-14 DIAGNOSIS — R4182 Altered mental status, unspecified: Secondary | ICD-10-CM | POA: Diagnosis not present

## 2011-08-14 DIAGNOSIS — M5030 Other cervical disc degeneration, unspecified cervical region: Secondary | ICD-10-CM | POA: Diagnosis not present

## 2011-08-14 DIAGNOSIS — E039 Hypothyroidism, unspecified: Secondary | ICD-10-CM

## 2011-08-14 DIAGNOSIS — E785 Hyperlipidemia, unspecified: Secondary | ICD-10-CM | POA: Diagnosis not present

## 2011-08-14 DIAGNOSIS — R404 Transient alteration of awareness: Secondary | ICD-10-CM | POA: Diagnosis not present

## 2011-08-14 DIAGNOSIS — R197 Diarrhea, unspecified: Secondary | ICD-10-CM | POA: Diagnosis present

## 2011-08-14 DIAGNOSIS — I2510 Atherosclerotic heart disease of native coronary artery without angina pectoris: Secondary | ICD-10-CM

## 2011-08-14 DIAGNOSIS — I951 Orthostatic hypotension: Secondary | ICD-10-CM | POA: Diagnosis present

## 2011-08-14 DIAGNOSIS — R55 Syncope and collapse: Secondary | ICD-10-CM | POA: Diagnosis not present

## 2011-08-14 DIAGNOSIS — I6789 Other cerebrovascular disease: Secondary | ICD-10-CM | POA: Diagnosis not present

## 2011-08-14 DIAGNOSIS — R5383 Other fatigue: Secondary | ICD-10-CM | POA: Diagnosis not present

## 2011-08-14 DIAGNOSIS — I25810 Atherosclerosis of coronary artery bypass graft(s) without angina pectoris: Secondary | ICD-10-CM | POA: Diagnosis not present

## 2011-08-14 DIAGNOSIS — Z951 Presence of aortocoronary bypass graft: Secondary | ICD-10-CM

## 2011-08-14 DIAGNOSIS — R5381 Other malaise: Secondary | ICD-10-CM | POA: Diagnosis not present

## 2011-08-14 DIAGNOSIS — G9340 Encephalopathy, unspecified: Secondary | ICD-10-CM | POA: Diagnosis present

## 2011-08-14 DIAGNOSIS — Z853 Personal history of malignant neoplasm of breast: Secondary | ICD-10-CM

## 2011-08-14 DIAGNOSIS — R410 Disorientation, unspecified: Secondary | ICD-10-CM

## 2011-08-14 HISTORY — DX: Acute myocardial infarction, unspecified: I21.9

## 2011-08-14 HISTORY — DX: Personal history of other endocrine, nutritional and metabolic disease: Z86.39

## 2011-08-14 LAB — BASIC METABOLIC PANEL
BUN: 13 mg/dL (ref 6–23)
Chloride: 88 mEq/L — ABNORMAL LOW (ref 96–112)
GFR calc Af Amer: 59 mL/min — ABNORMAL LOW (ref >90)
Potassium: 3.5 mEq/L (ref 3.5–5.1)
Sodium: 124 mEq/L — ABNORMAL LOW (ref 135–145)

## 2011-08-14 LAB — CBC
HCT: 32.5 % — ABNORMAL LOW (ref 36.0–46.0)
Hemoglobin: 11.2 g/dL — ABNORMAL LOW (ref 12.0–15.0)
RBC: 4.11 MIL/uL (ref 3.87–5.11)
WBC: 7 10*3/uL (ref 4.0–10.5)

## 2011-08-14 LAB — DIFFERENTIAL
Lymphocytes Relative: 6 % — ABNORMAL LOW (ref 12–46)
Lymphs Abs: 0.4 10*3/uL — ABNORMAL LOW (ref 0.7–4.0)
Monocytes Absolute: 0.3 10*3/uL (ref 0.1–1.0)
Monocytes Relative: 4 % (ref 3–12)
Neutro Abs: 6.3 10*3/uL (ref 1.7–7.7)

## 2011-08-14 LAB — URINALYSIS, ROUTINE W REFLEX MICROSCOPIC
Bilirubin Urine: NEGATIVE
Hgb urine dipstick: NEGATIVE
Specific Gravity, Urine: 1.023 (ref 1.005–1.030)
pH: 5.5 (ref 5.0–8.0)

## 2011-08-14 LAB — PROTIME-INR
INR: 1.08 (ref 0.00–1.49)
Prothrombin Time: 14.2 seconds (ref 11.6–15.2)

## 2011-08-14 LAB — POCT I-STAT TROPONIN I: Troponin i, poc: 0.08 ng/mL (ref 0.00–0.08)

## 2011-08-14 LAB — GLUCOSE, CAPILLARY

## 2011-08-14 LAB — CK: Total CK: 98 U/L (ref 7–177)

## 2011-08-14 MED ORDER — ZIPRASIDONE MESYLATE 20 MG IM SOLR
10.0000 mg | Freq: Once | INTRAMUSCULAR | Status: AC
  Administered 2011-08-14: 10 mg via INTRAMUSCULAR
  Filled 2011-08-14: qty 20

## 2011-08-14 MED ORDER — ONDANSETRON HCL 4 MG/2ML IJ SOLN
INTRAMUSCULAR | Status: AC
  Administered 2011-08-14: 17:00:00
  Filled 2011-08-14: qty 2

## 2011-08-14 NOTE — ED Notes (Signed)
EMS gave zofran 4mg  IV, #20 gauge LAC stated, EKG done en route to hospital.

## 2011-08-14 NOTE — Telephone Encounter (Signed)
New Msg: Pt godson calling wanting to speak with nurse/Md regarding pt recent ER visits. Pt has passed out three times in the past two weeks and pt godson would like to speak with nurse/MD regarding this to find the root of the issue. Please return pt godson call to discuss further.

## 2011-08-14 NOTE — ED Provider Notes (Signed)
History     CSN: [id number]  Arrival date & time 08/14/11  1619   First MD Initiated Contact with Patient 08/14/11 1638      Chief Complaint  Patient presents with  . Dizziness  . Weakness  . Altered Mental Status   PCP [place], [facility] [place] (Consider location/radiation/quality/duration/timing/severity/associated sxs/prior treatment) HPI This 78 year old female has had apparently 3 episodes of syncope in the last 2 weeks. She lives at home alone with no assistance the family does check on her periodically. She has been generally weak over the last 2 weeks. She was originally seen in the emergency noncardiac chest pain syndrome. She then returned complaining of constipation however since then she has had some loose stools since she has been on stool softeners. She was found on the floor today by a bystander who was mowing her lawn and looked in her window and saw her on the floor. Patient states she felt generally weak this morning but as the last thing she remembers. Next thing she remembers was BMI is waking her up and her coming to the hospital. She is now oriented to person place and time. She denies chest pain shortness of breath abdominal pain or vomiting. She has not had bloody stools she denies any change in speech vision swallowing or understanding. She is no lateralizing or focal weakness numbness or incoordination. When she was in the emergency department at her recent visit she did become lightheaded and had transient bradycardia and hypotension felt secondary to a vasovagal spell because she then felt much improved and was discharged. It is unknown how long she was lying on the floor today but she denies any known trauma. She states over the last few months she has had some posterior neck pain and that might be a little worse than usual today. It is mild and nonradiating she did not treat it with medicines prior to arrival she knows of. Her family is concerned she is too generally weak  to live at home alone in her current state. Past Medical History  Diagnosis Date  . IHD (ischemic heart disease)     with CABG in August 2002  . Hypertension   . Hypothyroidism   . Vitamin d deficiency   . Hyperlipidemia   . Anemia   . Hx of CABG     pt states she had 5 bypasses  . Breast cancer 1975    Right sided mastectomy  . Coronary artery disease   . Myocardial infarction     Non Q-wave MI  . Arthritis     osteoarthritis  . H/O hypokalemia     Past Surgical History  Procedure Date  . Coronary artery bypass graft 12/2000    LEFT INTERNAL MAMMARY TO THE LAD, SAPHENOUS VEIN GRAFT TO THE FIRST DIAGONAL, SAPHENOUS VEIN GRAFT TO THE RAMUS INTERMEDIATE, AND A SEQUENTIAL SAPHENOUS VEIN GRAFT TO THE POSTERIOR DESCENDING AND POSTERIOR LATERAL BRANCHES.  . Mastectomy 1975    RIGHT SIDE  . Cardiovascular stress test Aug 2010    Normal; EF 74%  . Cardiac catheterization 01/20/2001    NORMAL. EF 60-70%  . Tubal ligation     1960's  . Breast surgery     left breast cyst surgery   . Mass excision 04/08/2011    Procedure: EXCISION MASS;  Surgeon: Nicki Reaper, MD;  Location:  [facility];  Service: Orthopedics;  Laterality: Left;  excision mass left palm    Family History  Problem Relation Age of  Onset  . Alzheimer's disease Mother     History  Substance Use Topics  . Smoking status: Former Smoker -- 1.0 packs/day for 4 years    Types: Cigarettes    Quit date: 06/02/1958  . Smokeless tobacco: Never Used  . Alcohol Use: No    OB History    Grav Para Term Preterm Abortions TAB SAB Ect Mult Living                  Review of Systems  Constitutional: Negative for fever.       10 Systems reviewed and are negative for acute change except as noted in the HPI.  HENT: Negative for congestion.   Eyes: Negative for discharge and redness.  Respiratory: Negative for cough and shortness of breath.   Cardiovascular: Negative for chest pain.  Gastrointestinal:  Negative for vomiting and abdominal pain.  Musculoskeletal: Negative for back pain.  Skin: Negative for rash.  Neurological: Positive for syncope, weakness and light-headedness. Negative for numbness and headaches.  Psychiatric/Behavioral:       No behavior change.    Allergies  Valium; Crestor; Doxycycline; Lipitor; Sulfa drugs cross reactors; Tequin; Tylenol; Vytorin; and Zocor  Home Medications   No current outpatient prescriptions on file.  BP 108/55  Pulse 71  Temp(Src) 98.1 F (36.7 C) (Oral)  Resp 18  Ht 5\' 2"  (1.575 m)  Wt 143 lb 1.3 oz (64.9 kg)  BMI 26.17 kg/m2  SpO2 100%  Physical Exam  Nursing note and vitals reviewed. Constitutional: She is oriented to person, place, and time.       Awake, alert, nontoxic appearance with baseline speech for patient.  HENT:  Head: Atraumatic.  Mouth/Throat: No oropharyngeal exudate.  Eyes: EOM are normal. Pupils are equal, round, and reactive to light. Right eye exhibits no discharge. Left eye exhibits no discharge.  Neck: Neck supple.       Minimal posterior paracervical neck tenderness without midline cervical spine tenderness  Cardiovascular: Normal rate and regular rhythm.   No murmur heard. Pulmonary/Chest: Effort normal and breath sounds normal. No stridor. No respiratory distress. She has no wheezes. She has no rales. She exhibits no tenderness.  Abdominal: Soft. Bowel sounds are normal. She exhibits no mass. There is no tenderness. There is no rebound.  Musculoskeletal: She exhibits no edema and no tenderness.       Baseline ROM, moves extremities with no obvious new focal weakness.  Lymphadenopathy:    She has no cervical adenopathy.  Neurological: She is alert and oriented to person, place, and time.       Awake, alert, cooperative and aware of situation; motor strength bilaterally; sensation normal to light touch bilaterally; peripheral visual fields full to confrontation; no facial asymmetry; tongue midline; major  cranial nerves appear intact; no pronator drift, normal finger to nose bilaterally, she is currently oriented to person place and time  Skin: No rash noted.  Psychiatric: She has a normal mood and affect.    ED Course  Procedures (including critical care time) ECG: Sinus rhythm, ventricular rate 91, normal axis, first degree AV block, nonspecific T wave changes,nonspecific T changes slightly changed in V2 V3  While in the ED the patient has become confused and no longer following commands, and blood sugar will be rechecked which was essentially normal initially, labs are still pending.1950  Patient sleeping after receiving Geodon because she had been agitated climbing out of the bed with risk of falling and  risk of accidental self-harm  with new onset confusion and inability to follow commands yet moving all 4 ext well. Labs Reviewed  BASIC METABOLIC PANEL - Abnormal; Notable for the following:    Sodium 124 (*)    Chloride 88 (*)    Glucose, Bld 127 (*)    GFR calc non Af Amer 51 (*)    GFR calc Af Amer 59 (*)    All other components within normal limits  CBC - Abnormal; Notable for the following:    Hemoglobin 11.2 (*)    HCT 32.5 (*)    All other components within normal limits  DIFFERENTIAL - Abnormal; Notable for the following:    Neutrophils Relative 89 (*)    Lymphocytes Relative 6 (*)    Lymphs Abs 0.4 (*)    All other components within normal limits  URINALYSIS, ROUTINE W REFLEX MICROSCOPIC - Abnormal; Notable for the following:    Color, Urine AMBER (*) BIOCHEMICALS MAY BE AFFECTED BY COLOR   Ketones, ur TRACE (*)    All other components within normal limits  BASIC METABOLIC PANEL - Abnormal; Notable for the following:    Sodium 125 (*)    Chloride 90 (*)    GFR calc non Af Amer 47 (*)    GFR calc Af Amer 55 (*)    All other components within normal limits  CBC - Abnormal; Notable for the following:    Hemoglobin 11.4 (*)    HCT 32.4 (*)    All other components  within normal limits  OSMOLALITY - Abnormal; Notable for the following:    Osmolality 262 (*)    All other components within normal limits  GLUCOSE, CAPILLARY - Abnormal; Notable for the following:    Glucose-Capillary 69 (*)    All other components within normal limits  GLUCOSE, CAPILLARY - Abnormal; Notable for the following:    Glucose-Capillary 107 (*)    All other components within normal limits  CK  PROTIME-INR  GLUCOSE, CAPILLARY  POCT I-STAT TROPONIN I  OSMOLALITY, URINE  SODIUM, URINE, RANDOM  LIPID PANEL  TSH  GLUCOSE, CAPILLARY  GLUCOSE, CAPILLARY  GLUCOSE, CAPILLARY  GLUCOSE, POCT (MANUAL RESULT ENTRY)  GLUCOSE, POCT (MANUAL RESULT ENTRY)  GLUCOSE, POCT (MANUAL RESULT ENTRY)  GLUCOSE, POCT (MANUAL RESULT ENTRY)  HEMOGLOBIN A1C  GLUCOSE, POCT (MANUAL RESULT ENTRY)   Dg Chest 2 View  08/14/2011  *RADIOLOGY REPORT*  Clinical Data: Dizziness, weakness, altered mental status.  CHEST - 2 VIEW  Comparison: 08/02/2011  Findings: Prior CABG.  Mild cardiomegaly.  Low lung volumes.  No airspace opacities, effusions or edema.  No acute bony abnormality. Degenerative changes in the thoracic spine and shoulders.  IMPRESSION: Cardiomegaly.  Low lung volumes.  No acute findings.  Original Report Authenticated By: Cyndie Chime, M.D.   Ct Head Wo Contrast  08/14/2011  *RADIOLOGY REPORT*  Clinical Data:  Weakness.  Altered mental status.  CT HEAD WITHOUT CONTRAST CT CERVICAL SPINE WITHOUT CONTRAST  Technique:  Multidetector CT imaging of the head and cervical spine was performed following the standard protocol without intravenous contrast.  Multiplanar CT image reconstructions of the cervical spine were also generated.  Comparison:  08/04/2011  CT HEAD  Findings: Chronic ischemic changes and atrophy.  No mass effect, midline shift, or acute hemorrhage.  Mastoid air cells clear.  No fracture.  IMPRESSION: No acute intracranial pathology.  CT CERVICAL SPINE  Findings: No fracture.  No  dislocation.  Anatomic alignment. Severe narrowing at C5-6 with posterior osteophytic ridging. Uncovertebral  osteophytes result in bilateral foraminal narrowing at this level.  There is mild spinal stenosis as the this level. Degenerative changes at other levels are less prominent.  No obvious soft tissue abnormality to suggest soft tissue injury.  1.2 cm hypodensity in the right thyroid gland.  IMPRESSION: No acute injury.  Degenerative change.  1.2 cm right thyroid hypodensity.  Thyroid ultrasound is recommended.  Original Report Authenticated By: Donavan Burnet, M.D.   Ct Cervical Spine Wo Contrast  08/14/2011  *RADIOLOGY REPORT*  Clinical Data:  Weakness.  Altered mental status.  CT HEAD WITHOUT CONTRAST CT CERVICAL SPINE WITHOUT CONTRAST  Technique:  Multidetector CT imaging of the head and cervical spine was performed following the standard protocol without intravenous contrast.  Multiplanar CT image reconstructions of the cervical spine were also generated.  Comparison:  08/04/2011  CT HEAD  Findings: Chronic ischemic changes and atrophy.  No mass effect, midline shift, or acute hemorrhage.  Mastoid air cells clear.  No fracture.  IMPRESSION: No acute intracranial pathology.  CT CERVICAL SPINE  Findings: No fracture.  No dislocation.  Anatomic alignment. Severe narrowing at C5-6 with posterior osteophytic ridging. Uncovertebral osteophytes result in bilateral foraminal narrowing at this level.  There is mild spinal stenosis as the this level. Degenerative changes at other levels are less prominent.  No obvious soft tissue abnormality to suggest soft tissue injury.  1.2 cm hypodensity in the right thyroid gland.  IMPRESSION: No acute injury.  Degenerative change.  1.2 cm right thyroid hypodensity.  Thyroid ultrasound is recommended.  Original Report Authenticated By: Donavan Burnet, M.D.   US Soft Tissue Head/neck  08/15/2011  *RADIOLOGY REPORT*  Clinical Data: Thyroid nodule.  THYROID ULTRASOUND   Technique: Ultrasound examination of the thyroid gland and adjacent soft tissues was performed.  Comparison:  06/03/2005  Findings:  Right thyroid lobe:  4.1 x 1.7 x 2.2 cm. Left thyroid lobe:  4.0 x 1.2 x 1.7 cm. Isthmus:  3 mm.  Focal nodules:  Dominant nodule is in the right thyroid lobe measuring 2.0 x 1.6 x 1.4 cm, predominately solid.  This previously measured maximally 1.6 cm.  Small subcentimeteric solid and cystic nodules are scattered throughout the left lobe, the largest measuring 4 mm.  Diffuse heterogeneous echotexture throughout the thyroid.  Lymphadenopathy:  None visualized.  IMPRESSION: Dominant nodules in the right mid pole measuring up to 2.0 cm, predominately solid.  This has enlarged slightly since previous study 6 years ago when this measured 1.6 cm.  At that time, this contained cystic components.  Original Report Authenticated By: Cyndie Chime, M.D.     1. Syncope   2. Delirium   3. Thyroid nodule       MDM  Patient / Family / Caregiver understand and agree with initial ED impression and plan with expectations set for ED visit.Patient / Family / Caregiver informed of clinical course, understand medical decision-making process, and agree with plan.The patient appears reasonably stabilized for admission considering the current resources, flow, and capabilities available in the ED at this time, and I doubt any other [facility] requiring further screening and/or treatment in the ED prior to admission.        Hurman Horn, MD 08/15/11 2352

## 2011-08-14 NOTE — Telephone Encounter (Signed)
Spoke with Natalie Bullock (pt's nephew and listed in her contacts). He is asking if Dr. Jens Som is pt's primary MD. I told him that Dr. Jens Som is her cardiologist and that Dr. Clide Deutscher is her primary MD.  He reports Dr. Clide Deutscher is planning to retire soon.  Nephew is concerned about pt's recent episodes of passing out and "wants to find out what is going on with her."  He reports pt is presently in ED after passing out and falling.  Also having diarrhea. I told him he would need to discuss pt's care  with ED doctor and if cardiology consult was indicated ED doctor would then contact Dr. Jens Som.  I told him I would also pass information on to Dr. Jens Som about pt being in ED.

## 2011-08-14 NOTE — ED Notes (Signed)
Pt found on floor, unknown how long she was there, pt denies fall. Pt feels weak and dizzy, pt remembers eating lunch but after became dizzy and nauseated and "laid in the floor, pt lives at home alone, is incontinent of bowel and bladder, neigher states that this is the 3rd time this week that the pt has come to the hospital for the same thing. Pt slightly AMS oriented x2 per EMS

## 2011-08-14 NOTE — Telephone Encounter (Signed)
Agree with eval in ER and cardiology consult if needed Olga Millers

## 2011-08-14 NOTE — H&P (Addendum)
PCP:   Burtis Junes, MD, MD   Chief Complaint:  Syncope and collapse  HPI: This is a 78 year old female who lives alone, she was found on the floor today. History provided by patient's power of attorney Otelia Limes [phone]. Patient was of usual state of health was seen earlier today, later found down. Patient had total amnesia surrounding the event. She is unable to provide any history. Additionally patient did become acutely confused lately while in the ER, she received Geodon and is sleeping soundly. Prior to this per power of attorney patient has not been confused.  Per patient's officer this is the patient's third syncopal episode in approximately 3-4 weeks. On the first occasion she did complain of chest pains, other than this patient has been asymptomatic. There is no report of any fever, any chills. There is report of a nausea and vomiting once and a single episode of diarrhea today. There is report that patient is dizzy. Here the ER the patient was per weak, to the point where they were not able to stand her to check orthostatic vitals.  Review of Systems:  Unable to assess due to effect of medication  Past Medical History: Past Medical History  Diagnosis Date  . IHD (ischemic heart disease)     with CABG in August 2002  . Hypertension   . Hypothyroidism   . Vitamin d deficiency   . Hyperlipidemia   . Anemia   . Hx of CABG     pt states she had 5 bypasses  . Breast cancer 1975    Right sided mastectomy  . Coronary artery disease   . Arthritis    Past Surgical History  Procedure Date  . Coronary artery bypass graft 12/2000    LEFT INTERNAL MAMMARY TO THE LAD, SAPHENOUS VEIN GRAFT TO THE FIRST DIAGONAL, SAPHENOUS VEIN GRAFT TO THE RAMUS INTERMEDIATE, AND A SEQUENTIAL SAPHENOUS VEIN GRAFT TO THE POSTERIOR DESCENDING AND POSTERIOR LATERAL BRANCHES.  . Mastectomy 1975    RIGHT SIDE  . Cardiovascular stress test Aug 2010    Normal; EF 74%  . Cardiac catheterization  01/20/2001    NORMAL. EF 60-70%  . Tubal ligation     1960's  . Breast surgery     left breast cyst surgery   . Mass excision 04/08/2011    Procedure: EXCISION MASS;  Surgeon: Nicki Reaper, MD;  Location: [facility];  Service: Orthopedics;  Laterality: Left;  excision mass left palm    Medications: Prior to Admission medications   Medication Sig Start Date End Date Taking? Authorizing Provider  acetaminophen (TYLENOL) 500 MG tablet Take 500 mg by mouth every 6 (six) hours as needed. For pain   Yes Historical Provider, MD  acetaminophen (TYLENOL) 650 MG CR tablet Take 1,300 mg by mouth daily as needed. For pain.   Yes Historical Provider, MD  aspirin 81 MG tablet Take 162 mg by mouth daily.    Yes Historical Provider, MD  ciprofloxacin (CIPRO) 500 MG tablet Take 1 tablet (500 mg total) by mouth every 12 (twelve) hours. 08/04/11 08/14/11 Yes Fayrene Helper, PA-C  levothyroxine (SYNTHROID, LEVOTHROID) 50 MCG tablet Take 50 mcg by mouth daily.    Yes Historical Provider, MD  magnesium hydroxide (MILK OF MAGNESIA) 400 MG/5ML suspension Take 30 mLs by mouth daily as needed. For constipation   Yes Historical Provider, MD  metroNIDAZOLE (FLAGYL) 500 MG tablet Take 1 tablet (500 mg total) by mouth 2 (two) times daily. 08/04/11 08/14/11 Yes  Fayrene Helper, PA-C  ondansetron (ZOFRAN) 4 MG tablet Take 4 mg by mouth every 6 (six) hours as needed. For nausea.   Yes Historical Provider, MD  Polyethyl Glycol-Propyl Glycol (SYSTANE OP) Place 2 drops into both eyes as needed. For dryness.   Yes Historical Provider, MD  pravastatin (PRAVACHOL) 40 MG tablet Take 40 mg by mouth daily. 06/27/11  Yes Lewayne Bunting, MD  ramipril (ALTACE) 10 MG tablet Take 10 mg by mouth daily. 02/17/11  Yes Rosalio Macadamia, NP  Simethicone (GAS-X PO) Take 1 capsule by mouth daily as needed. For bloating   Yes Historical Provider, MD  triamterene-hydrochlorothiazide (MAXZIDE-25) 37.5-25 MG per tablet Take 1 tablet by mouth daily.     Yes Historical Provider, MD    Allergies:   Allergies  Allergen Reactions  . Valium Shortness Of Breath  . Crestor (Rosuvastatin Calcium) Other (See Comments)    unknown  . Doxycycline Other (See Comments)    uknown  . Lipitor (Atorvastatin Calcium) Other (See Comments)    unknown  . Sulfa Drugs Cross Reactors Other (See Comments)    unknown  . Tequin Other (See Comments)    unknown  . Tylenol (Acetaminophen) Other (See Comments)    Unknown: aaNO. 3  . Vytorin Other (See Comments)    unknown  . Zocor (Simvastatin) Other (See Comments)    unknown    Social History:  reports that she quit smoking about 53 years ago. Her smoking use included Cigarettes. She has a 4 pack-year smoking history. She has never used smokeless tobacco. She reports that she does not drink alcohol or use illicit drugs. lives alone, does all her ADLs a. Doesn't use a cane. Doesn't have oxygen.  Family History: Family History  Problem Relation Age of Onset  . Alzheimer's disease Mother     Physical Exam: Filed Vitals:   08/14/11 1639 08/14/11 2024 08/14/11 2042 08/14/11 2245  BP: 142/93  167/61 140/62  Pulse: 75  79 72  Temp: 98.3 F (36.8 C)     TempSrc: Oral     Resp: 18  16 16   SpO2: 99% 100% 100% 100%    General:  Patient sleeping but arousable, well developed and nourished, no acute distress Eyes: PERRLA, pink conjunctiva, no scleral icterus ENT: Moist oral mucosa, neck supple, no thyromegaly Lungs: clear to ascultation, no wheeze, no crackles, no use of accessory muscles, evidence of patient's mastectomy Cardiovascular: regular rate and rhythm, no regurgitation, no gallops, no murmurs. No carotid bruits, no JVD Abdomen: soft, positive BS, non-tender, non-distended, no organomegaly, not an acute abdomen GU: not examined Neuro: CN II - XII unable to assess due to medication sensation intact Musculoskeletal: strength unable to assess due to medication no clubbing, cyanosis or edema Skin:  no rash, no subcutaneous crepitation, no decubitus    Labs on Admission:   [facility] 08/14/11 1725  NA 124*  K 3.5  CL 88*  CO2 21  GLUCOSE 127*  BUN 13  CREATININE 0.99  CALCIUM 9.9  MG --  PHOS --   No results found for this basename: AST:2,ALT:2,ALKPHOS:2,BILITOT:2,PROT:2,ALBUMIN:2 in the last 72 hours No results found for this basename: LIPASE:2,AMYLASE:2 in the last 72 hours  Basename 08/14/11 2040  WBC 7.0  NEUTROABS 6.3  HGB 11.2*  HCT 32.5*  MCV 79.1  PLT 229    Basename 08/14/11 1725  CKTOTAL 98  CKMB --  CKMBINDEX --  TROPONINI --   No components found with this basename: POCBNP:3 No results  found for this basename: DDIMER:2 in the last 72 hours No results found for this basename: HGBA1C:2 in the last 72 hours No results found for this basename: CHOL:2,HDL:2,LDLCALC:2,TRIG:2,CHOLHDL:2,LDLDIRECT:2 in the last 72 hours No results found for this basename: TSH,T4TOTAL,FREET3,T3FREE,THYROIDAB in the last 72 hours No results found for this basename: VITAMINB12:2,FOLATE:2,FERRITIN:2,TIBC:2,IRON:2,RETICCTPCT:2 in the last 72 hours  Micro Results: No results found for this or any previous visit (from the past 240 hour(s)). Results for KIYOKO, MCGUIRT (MRN [id number]) as of 08/14/2011 23:51  Ref. Range 08/14/2011 20:23  Color, Urine Latest Range: YELLOW  AMBER (A)  APPearance Latest Range: CLEAR  CLEAR  Specific Gravity, Urine Latest Range: 1.005-1.030  1.023  pH Latest Range: 5.0-8.0  5.5  Glucose, UA Latest Range: NEGATIVE mg/dL NEGATIVE  Bilirubin Urine Latest Range: NEGATIVE  NEGATIVE  Ketones, ur Latest Range: NEGATIVE mg/dL TRACE (A)  Protein Latest Range: NEGATIVE mg/dL NEGATIVE  Urobilinogen, UA Latest Range: 0.0-1.0 mg/dL 0.2  Nitrite Latest Range: NEGATIVE  NEGATIVE  Leukocytes, UA Latest Range: NEGATIVE  NEGATIVE  Hgb urine dipstick Latest Range: NEGATIVE  NEGATIVE    Radiological Exams on Admission: Dg Chest 2 View  08/14/2011  *RADIOLOGY REPORT*   Clinical Data: Dizziness, weakness, altered mental status.  CHEST - 2 VIEW  Comparison: 08/02/2011  Findings: Prior CABG.  Mild cardiomegaly.  Low lung volumes.  No airspace opacities, effusions or edema.  No acute bony abnormality. Degenerative changes in the thoracic spine and shoulders.  IMPRESSION: Cardiomegaly.  Low lung volumes.  No acute findings.  Original Report Authenticated By: Cyndie Chime, M.D.   Ct Head Wo Contrast  08/14/2011  *RADIOLOGY REPORT*  Clinical Data:  Weakness.  Altered mental status.  CT HEAD WITHOUT CONTRAST CT CERVICAL SPINE WITHOUT CONTRAST  Technique:  Multidetector CT imaging of the head and cervical spine was performed following the standard protocol without intravenous contrast.  Multiplanar CT image reconstructions of the cervical spine were also generated.  Comparison:  08/04/2011  CT HEAD  Findings: Chronic ischemic changes and atrophy.  No mass effect, midline shift, or acute hemorrhage.  Mastoid air cells clear.  No fracture.  IMPRESSION: No acute intracranial pathology.  CT CERVICAL SPINE  Findings: No fracture.  No dislocation.  Anatomic alignment. Severe narrowing at C5-6 with posterior osteophytic ridging. Uncovertebral osteophytes result in bilateral foraminal narrowing at this level.  There is mild spinal stenosis as the this level. Degenerative changes at other levels are less prominent.  No obvious soft tissue abnormality to suggest soft tissue injury.  1.2 cm hypodensity in the right thyroid gland.  IMPRESSION: No acute injury.  Degenerative change.  1.2 cm right thyroid hypodensity.  Thyroid ultrasound is recommended.  Original Report Authenticated By: Donavan Burnet, M.D.   Ct Cervical Spine Wo Contrast  08/14/2011  *RADIOLOGY REPORT*  Clinical Data:  Weakness.  Altered mental status.  CT HEAD WITHOUT CONTRAST CT CERVICAL SPINE WITHOUT CONTRAST  Technique:  Multidetector CT imaging of the head and cervical spine was performed following the standard protocol  without intravenous contrast.  Multiplanar CT image reconstructions of the cervical spine were also generated.  Comparison:  08/04/2011  CT HEAD  Findings: Chronic ischemic changes and atrophy.  No mass effect, midline shift, or acute hemorrhage.  Mastoid air cells clear.  No fracture.  IMPRESSION: No acute intracranial pathology.  CT CERVICAL SPINE  Findings: No fracture.  No dislocation.  Anatomic alignment. Severe narrowing at C5-6 with posterior osteophytic ridging. Uncovertebral osteophytes result in bilateral foraminal narrowing  at this level.  There is mild spinal stenosis as the this level. Degenerative changes at other levels are less prominent.  No obvious soft tissue abnormality to suggest soft tissue injury.  1.2 cm hypodensity in the right thyroid gland.  IMPRESSION: No acute injury.  Degenerative change.  1.2 cm right thyroid hypodensity.  Thyroid ultrasound is recommended.  Original Report Authenticated By: Donavan Burnet, M.D.    EKG normal sinus rhythm  Assessment/Plan Present on Admission:  Hyponatremia  .Syncope and collapse Admit to telemetry  2-D echo and carotid ultrasound in a.m.  Physical therapy consults and monitor in telemetry Patient on aspirin 325 mg daily, check lipid panel in the a.m. Patient episode of encephalopathy in the ER. May be related to patient's hyponatremia. Patient's diuretic has been discontinued. TSH, urine sodium and osmolarity as well as low-salt monitor ordered. IV fluid hydration. Reassess in a.m.  Per power of attorney this is patient's third syncopal episode in 3-4 weeks. If workup negative question cardiology consulted full inpatient versus outpatient.  Marland KitchenCAD (coronary artery disease) .Hyperlipidemia  stable resume home medications  DO NOT RESUSCITATE DVT prophylaxis Team 2/Dr. Nadara Mustard, Marquin Patino 08/14/2011, 11:51 PM

## 2011-08-14 NOTE — ED Notes (Signed)
Pt calm at this time.  Pt does not show need for sitter or restraints at this time, daughter remains at bedside.

## 2011-08-14 NOTE — ED Notes (Signed)
RUE:[id number]<JW> Expected date:08/14/11<BR> Expected time:<BR> Means of arrival:<BR> Comments:<BR> EMS 10 GC - weakness/dizziness

## 2011-08-14 NOTE — ED Notes (Signed)
Patient transported to CT 

## 2011-08-14 NOTE — ED Notes (Signed)
Pt appears to becoming more confused. Pt won't keep gown or cardiac monitor on.  Daughter remains at bedside.

## 2011-08-14 NOTE — ED Notes (Signed)
Pt too restless at this time. Will get labs when able. RN aware

## 2011-08-15 ENCOUNTER — Inpatient Hospital Stay (HOSPITAL_COMMUNITY): Payer: Medicare Other

## 2011-08-15 ENCOUNTER — Encounter (HOSPITAL_COMMUNITY): Payer: Self-pay

## 2011-08-15 DIAGNOSIS — G319 Degenerative disease of nervous system, unspecified: Secondary | ICD-10-CM | POA: Diagnosis not present

## 2011-08-15 DIAGNOSIS — I2510 Atherosclerotic heart disease of native coronary artery without angina pectoris: Secondary | ICD-10-CM | POA: Diagnosis present

## 2011-08-15 DIAGNOSIS — Z66 Do not resuscitate: Secondary | ICD-10-CM | POA: Diagnosis present

## 2011-08-15 DIAGNOSIS — I951 Orthostatic hypotension: Secondary | ICD-10-CM | POA: Diagnosis present

## 2011-08-15 DIAGNOSIS — E785 Hyperlipidemia, unspecified: Secondary | ICD-10-CM | POA: Diagnosis present

## 2011-08-15 DIAGNOSIS — E039 Hypothyroidism, unspecified: Secondary | ICD-10-CM | POA: Diagnosis present

## 2011-08-15 DIAGNOSIS — I25810 Atherosclerosis of coronary artery bypass graft(s) without angina pectoris: Secondary | ICD-10-CM | POA: Diagnosis not present

## 2011-08-15 DIAGNOSIS — E042 Nontoxic multinodular goiter: Secondary | ICD-10-CM | POA: Diagnosis not present

## 2011-08-15 DIAGNOSIS — F29 Unspecified psychosis not due to a substance or known physiological condition: Secondary | ICD-10-CM | POA: Diagnosis not present

## 2011-08-15 DIAGNOSIS — I059 Rheumatic mitral valve disease, unspecified: Secondary | ICD-10-CM

## 2011-08-15 DIAGNOSIS — G40909 Epilepsy, unspecified, not intractable, without status epilepticus: Secondary | ICD-10-CM | POA: Diagnosis not present

## 2011-08-15 DIAGNOSIS — R197 Diarrhea, unspecified: Secondary | ICD-10-CM | POA: Diagnosis present

## 2011-08-15 DIAGNOSIS — I10 Essential (primary) hypertension: Secondary | ICD-10-CM | POA: Diagnosis not present

## 2011-08-15 DIAGNOSIS — R55 Syncope and collapse: Secondary | ICD-10-CM | POA: Diagnosis not present

## 2011-08-15 DIAGNOSIS — Z853 Personal history of malignant neoplasm of breast: Secondary | ICD-10-CM | POA: Diagnosis not present

## 2011-08-15 DIAGNOSIS — E871 Hypo-osmolality and hyponatremia: Secondary | ICD-10-CM | POA: Diagnosis not present

## 2011-08-15 DIAGNOSIS — Z951 Presence of aortocoronary bypass graft: Secondary | ICD-10-CM | POA: Diagnosis not present

## 2011-08-15 DIAGNOSIS — I6789 Other cerebrovascular disease: Secondary | ICD-10-CM | POA: Diagnosis not present

## 2011-08-15 DIAGNOSIS — G9340 Encephalopathy, unspecified: Secondary | ICD-10-CM | POA: Diagnosis present

## 2011-08-15 DIAGNOSIS — C50919 Malignant neoplasm of unspecified site of unspecified female breast: Secondary | ICD-10-CM | POA: Diagnosis not present

## 2011-08-15 DIAGNOSIS — Z5189 Encounter for other specified aftercare: Secondary | ICD-10-CM | POA: Diagnosis not present

## 2011-08-15 LAB — CBC
HCT: 32.4 % — ABNORMAL LOW (ref 36.0–46.0)
Hemoglobin: 11.4 g/dL — ABNORMAL LOW (ref 12.0–15.0)
MCHC: 35.2 g/dL (ref 30.0–36.0)
RBC: 4.1 MIL/uL (ref 3.87–5.11)

## 2011-08-15 LAB — BASIC METABOLIC PANEL
BUN: 13 mg/dL (ref 6–23)
Chloride: 90 mEq/L — ABNORMAL LOW (ref 96–112)
GFR calc Af Amer: 55 mL/min — ABNORMAL LOW (ref >90)
GFR calc non Af Amer: 47 mL/min — ABNORMAL LOW (ref >90)
Potassium: 5 mEq/L (ref 3.5–5.1)
Sodium: 125 mEq/L — ABNORMAL LOW (ref 135–145)

## 2011-08-15 LAB — LIPID PANEL
LDL Cholesterol: 41 mg/dL (ref 0–99)
VLDL: 13 mg/dL (ref 0–40)

## 2011-08-15 LAB — GLUCOSE, CAPILLARY: Glucose-Capillary: 107 mg/dL — ABNORMAL HIGH (ref 70–99)

## 2011-08-15 LAB — OSMOLALITY: Osmolality: 262 mOsm/kg — ABNORMAL LOW (ref 275–300)

## 2011-08-15 LAB — OSMOLALITY, URINE: Osmolality, Ur: 466 mOsm/kg (ref 390–1090)

## 2011-08-15 LAB — TSH: TSH: 0.517 u[IU]/mL (ref 0.350–4.500)

## 2011-08-15 MED ORDER — ACETAMINOPHEN 650 MG RE SUPP
650.0000 mg | Freq: Four times a day (QID) | RECTAL | Status: DC | PRN
  Administered 2011-08-15: 650 mg via RECTAL

## 2011-08-15 MED ORDER — POTASSIUM CHLORIDE IN NACL 20-0.9 MEQ/L-% IV SOLN
INTRAVENOUS | Status: DC
  Administered 2011-08-15: 03:00:00 via INTRAVENOUS
  Filled 2011-08-15 (×2): qty 1000

## 2011-08-15 MED ORDER — SODIUM CHLORIDE 0.9 % IV SOLN
INTRAVENOUS | Status: AC
  Administered 2011-08-15: 100 mL/h via INTRAVENOUS

## 2011-08-15 MED ORDER — RAMIPRIL 10 MG PO TABS
10.0000 mg | ORAL_TABLET | Freq: Every day | ORAL | Status: DC
  Filled 2011-08-15 (×3): qty 1

## 2011-08-15 MED ORDER — RAMIPRIL 10 MG PO TABS
10.0000 mg | ORAL_TABLET | Freq: Every day | ORAL | Status: DC
  Filled 2011-08-15: qty 1

## 2011-08-15 MED ORDER — ENOXAPARIN SODIUM 40 MG/0.4ML ~~LOC~~ SOLN
40.0000 mg | SUBCUTANEOUS | Status: DC
Start: 2011-08-15 — End: 2011-08-18
  Administered 2011-08-15 – 2011-08-18 (×4): 40 mg via SUBCUTANEOUS
  Filled 2011-08-15 (×5): qty 0.4

## 2011-08-15 MED ORDER — ACETAMINOPHEN 650 MG RE SUPP
RECTAL | Status: AC
  Filled 2011-08-15: qty 1

## 2011-08-15 MED ORDER — ACETAMINOPHEN 650 MG RE SUPP
RECTAL | Status: AC
  Administered 2011-08-15: 650 mg via RECTAL
  Filled 2011-08-15: qty 1

## 2011-08-15 MED ORDER — ENOXAPARIN SODIUM 30 MG/0.3ML ~~LOC~~ SOLN
30.0000 mg | SUBCUTANEOUS | Status: DC
  Filled 2011-08-15: qty 0.3

## 2011-08-15 MED ORDER — LEVOTHYROXINE SODIUM 50 MCG PO TABS
50.0000 ug | ORAL_TABLET | Freq: Every day | ORAL | Status: DC
  Administered 2011-08-15 – 2011-08-18 (×4): 50 ug via ORAL
  Filled 2011-08-15 (×5): qty 1

## 2011-08-15 MED ORDER — ACETAMINOPHEN 325 MG PO TABS
650.0000 mg | ORAL_TABLET | Freq: Four times a day (QID) | ORAL | Status: DC | PRN
  Administered 2011-08-16: 650 mg via ORAL
  Filled 2011-08-15: qty 2

## 2011-08-15 MED ORDER — ASPIRIN 325 MG PO TABS
325.0000 mg | ORAL_TABLET | Freq: Every day | ORAL | Status: DC
  Administered 2011-08-15 – 2011-08-18 (×4): 325 mg via ORAL
  Filled 2011-08-15 (×5): qty 1

## 2011-08-15 NOTE — Progress Notes (Signed)
Pt ready for transport. Pt resting/asleep. Report called to [facility] [address]. All questions answered.

## 2011-08-15 NOTE — Progress Notes (Signed)
Pt's heart rate dropped into sustained 40's-50's.  Pt lethargic but responds to stimuli.  MD made aware.  Orders received.  Will continue to monitor pt.

## 2011-08-15 NOTE — Progress Notes (Signed)
   CARE MANAGEMENT NOTE 08/15/2011  Patient:  Natalie Bullock, Natalie Bullock   Account Number:  [id number]  Date Initiated:  08/15/2011  Documentation initiated by:  Lanier Clam  Subjective/Objective Assessment:   ADMITTED W/SYNCOPE.     Action/Plan:   FROM HOME ALONE. MARK(HCPOA)  TEL#[phone].   Anticipated DC Date:  08/20/2011   Anticipated DC Plan:  HOME W HOME HEALTH SERVICES         Choice offered to / List presented to:             Status of service:  In process, will continue to follow Medicare Important Message given?   (If response is "NO", the following Medicare IM given date fields will be blank) Date Medicare IM given:   Date Additional Medicare IM given:    Discharge Disposition:    Per UR Regulation:  Reviewed for med. necessity/level of care/duration of stay  If discussed at Long Length of Stay Meetings, dates discussed:    Comments:  08/15/11 Kymorah Korf RN,BSN NCM [phone] RECOMMEND PT/OT EVAL WHEN MD FEEL MED APPROPRIATE.

## 2011-08-15 NOTE — Progress Notes (Signed)
Prior to pt transport, pt temp checked, and it was 103.0 Md paged for prn Tylenol. Receiving RN called and made aware. Pt ready to transfer to [facility]

## 2011-08-15 NOTE — Progress Notes (Signed)
VASCULAR LAB PRELIMINARY  PRELIMINARY  PRELIMINARY  PRELIMINARY  Carotid duplex completed.    Preliminary report:  Bilateral:  No evidence of hemodynamically significant internal carotid artery stenosis.   Vertebral artery flow is antegrade.     Kiannah Grunow D, RVS 08/15/2011, 11:50 AM

## 2011-08-15 NOTE — Progress Notes (Signed)
  Echocardiogram 2D Echocardiogram has been performed.  Natalie Bullock A 08/15/2011, 9:33 AM

## 2011-08-15 NOTE — Progress Notes (Signed)
Pt calmer. Family at bedside at this time. Pt has a bed request for inpatient but no assignment as of this time. Pt well. Will continue to assess pt and needs

## 2011-08-15 NOTE — Progress Notes (Signed)
Spoke with pharmacy about order for Tylenol since pt has a listed allergy to Tylenol #3. Was told to speak with MD. I spoke with Dr. Betti Cruz in regards to the patient's temperature and order for Tylenol, although pt has an allergy to Tylenol #3 listed.He is aware that patient is lethargic due to meds given in ED and no family is available for verification at this time. He states that it is ok to give because the allergy is most likely due to the codeine, not the actual Tylenol. RN to call if any issues arise. Pharmacy aware. Will monitor pt.

## 2011-08-15 NOTE — Progress Notes (Signed)
Subjective:  Sleepy. No complains. No agitation Objective: Filed Vitals:   08/15/11 0025 08/15/11 0244 08/15/11 0312 08/15/11 0506  BP: 134/62  119/65 119/53  Pulse: 78  77 87  Temp:  103 F (39.4 C) 101.2 F (38.4 C) 100.3 F (37.9 C)  TempSrc:  Oral Axillary Axillary  Resp: 18  16 18   Weight:   64.9 kg (143 lb 1.3 oz)   SpO2: 100%  99% 100%   Weight change:  No intake or output data in the 24 hours ending 08/15/11 0815  General: Alert, awake, oriented 1, in no acute distress.  HEENT: No bruits, no goiter.  Heart: Regular rate and rhythm, without murmurs, rubs, gallops.  Lungs: Crackles left side, bilateral air movement.  Abdomen: Soft, nontender, nondistended, positive bowel sounds.  Neuro: Grossly intact, nonfocal.   Lab Results:  Basename 08/15/11 0600 08/14/11 1725  NA 125* 124*  K 5.0 3.5  CL 90* 88*  CO2 24 21  GLUCOSE 97 127*  BUN 13 13  CREATININE 1.05 0.99  CALCIUM 9.4 9.9  MG -- --  PHOS -- --   No results found for this basename: AST:2,ALT:2,ALKPHOS:2,BILITOT:2,PROT:2,ALBUMIN:2 in the last 72 hours No results found for this basename: LIPASE:2,AMYLASE:2 in the last 72 hours  Basename 08/15/11 0500 08/14/11 2040  WBC 7.3 7.0  NEUTROABS -- 6.3  HGB 11.4* 11.2*  HCT 32.4* 32.5*  MCV 79.0 79.1  PLT 203 229    Basename 08/14/11 1725  CKTOTAL 98  CKMB --  CKMBINDEX --  TROPONINI --   No components found with this basename: POCBNP:3 No results found for this basename: DDIMER:2 in the last 72 hours No results found for this basename: HGBA1C:2 in the last 72 hours No results found for this basename: CHOL:2,HDL:2,LDLCALC:2,TRIG:2,CHOLHDL:2,LDLDIRECT:2 in the last 72 hours No results found for this basename: TSH,T4TOTAL,FREET3,T3FREE,THYROIDAB in the last 72 hours No results found for this basename: VITAMINB12:2,FOLATE:2,FERRITIN:2,TIBC:2,IRON:2,RETICCTPCT:2 in the last 72 hours  Micro Results: No results found for this or any previous visit (from  the past 240 hour(s)).  Studies/Results: Dg Chest 2 View  08/14/2011  *RADIOLOGY REPORT*  Clinical Data: Dizziness, weakness, altered mental status.  CHEST - 2 VIEW  Comparison: 08/02/2011  Findings: Prior CABG.  Mild cardiomegaly.  Low lung volumes.  No airspace opacities, effusions or edema.  No acute bony abnormality. Degenerative changes in the thoracic spine and shoulders.  IMPRESSION: Cardiomegaly.  Low lung volumes.  No acute findings.  Original Report Authenticated By: Cyndie Chime, M.D.   Ct Head Wo Contrast  08/14/2011  *RADIOLOGY REPORT*  Clinical Data:  Weakness.  Altered mental status.  CT HEAD WITHOUT CONTRAST CT CERVICAL SPINE WITHOUT CONTRAST  Technique:  Multidetector CT imaging of the head and cervical spine was performed following the standard protocol without intravenous contrast.  Multiplanar CT image reconstructions of the cervical spine were also generated.  Comparison:  08/04/2011  CT HEAD  Findings: Chronic ischemic changes and atrophy.  No mass effect, midline shift, or acute hemorrhage.  Mastoid air cells clear.  No fracture.  IMPRESSION: No acute intracranial pathology.  CT CERVICAL SPINE  Findings: No fracture.  No dislocation.  Anatomic alignment. Severe narrowing at C5-6 with posterior osteophytic ridging. Uncovertebral osteophytes result in bilateral foraminal narrowing at this level.  There is mild spinal stenosis as the this level. Degenerative changes at other levels are less prominent.  No obvious soft tissue abnormality to suggest soft tissue injury.  1.2 cm hypodensity in the right thyroid gland.  IMPRESSION: No acute injury.  Degenerative change.  1.2 cm right thyroid hypodensity.  Thyroid ultrasound is recommended.  Original Report Authenticated By: Donavan Burnet, M.D.   Ct Cervical Spine Wo Contrast  08/14/2011  *RADIOLOGY REPORT*  Clinical Data:  Weakness.  Altered mental status.  CT HEAD WITHOUT CONTRAST CT CERVICAL SPINE WITHOUT CONTRAST  Technique:   Multidetector CT imaging of the head and cervical spine was performed following the standard protocol without intravenous contrast.  Multiplanar CT image reconstructions of the cervical spine were also generated.  Comparison:  08/04/2011  CT HEAD  Findings: Chronic ischemic changes and atrophy.  No mass effect, midline shift, or acute hemorrhage.  Mastoid air cells clear.  No fracture.  IMPRESSION: No acute intracranial pathology.  CT CERVICAL SPINE  Findings: No fracture.  No dislocation.  Anatomic alignment. Severe narrowing at C5-6 with posterior osteophytic ridging. Uncovertebral osteophytes result in bilateral foraminal narrowing at this level.  There is mild spinal stenosis as the this level. Degenerative changes at other levels are less prominent.  No obvious soft tissue abnormality to suggest soft tissue injury.  1.2 cm hypodensity in the right thyroid gland.  IMPRESSION: No acute injury.  Degenerative change.  1.2 cm right thyroid hypodensity.  Thyroid ultrasound is recommended.  Original Report Authenticated By: Donavan Burnet, M.D.    Medications: I have reviewed the patient's current medications.  Assessment and plan: Principal Problem:  *Syncope and collapse: She was admitted to the telemetry floor she is no events, she had one set of cardiac enzymes a normal CK and troponin I of 0.08. Her EKG shows sinus bradycardia, with a left axis deviation which is not new. She also has flipped T waves in V1 through V3 which are old and she has a first degree AV block. Her CT scan of the head did not show any bleed. Her chest x-ray does not show any acute infiltrates or cardiopulmonary disease. She is hyponatremic and hypochloremic, her creatinine is just slightly up from baseline. She is on hydrochlorothiazide at home. So the most likely cause of her syncope would be intravascular volume depletion. We'll change her IV fluids to normal saline and check a basic metabolic panel tomorrow morning. Active  Problems:  CAD (coronary artery disease): She is not complaining of any chest pain, continue aspirin.   Hyperlipidemia Continue home meds.   Hypothyroidism TSH is pending, continue Synthroid at current dose.   Hyponatremia Please see syncope, she is probably intravascularly depleted. Urinary sodium cannot be added to the initial UA. We'll try to check it now. She has been more than 12 hours of IV fluids so the urinary sodium might be unreliable.     LOS: 1 day   Marinda Elk M.D. Pager: [phone] [facility] 08/15/2011, 8:15 AM

## 2011-08-16 ENCOUNTER — Inpatient Hospital Stay (HOSPITAL_COMMUNITY): Payer: Medicare Other

## 2011-08-16 DIAGNOSIS — E871 Hypo-osmolality and hyponatremia: Secondary | ICD-10-CM | POA: Diagnosis not present

## 2011-08-16 DIAGNOSIS — I10 Essential (primary) hypertension: Secondary | ICD-10-CM | POA: Diagnosis not present

## 2011-08-16 DIAGNOSIS — G319 Degenerative disease of nervous system, unspecified: Secondary | ICD-10-CM | POA: Diagnosis not present

## 2011-08-16 DIAGNOSIS — I25810 Atherosclerosis of coronary artery bypass graft(s) without angina pectoris: Secondary | ICD-10-CM | POA: Diagnosis not present

## 2011-08-16 DIAGNOSIS — R55 Syncope and collapse: Secondary | ICD-10-CM | POA: Diagnosis not present

## 2011-08-16 LAB — GLUCOSE, CAPILLARY
Glucose-Capillary: 89 mg/dL (ref 70–99)
Glucose-Capillary: 109 mg/dL — ABNORMAL HIGH (ref 70–99)

## 2011-08-16 LAB — HEMOGLOBIN A1C: Hgb A1c MFr Bld: 5.4 % (ref <5.7)

## 2011-08-16 MED ORDER — RAMIPRIL 10 MG PO CAPS
10.0000 mg | ORAL_CAPSULE | Freq: Every day | ORAL | Status: DC
  Administered 2011-08-16 – 2011-08-17 (×2): 10 mg via ORAL
  Filled 2011-08-16 (×2): qty 1

## 2011-08-16 MED ORDER — GADOBENATE DIMEGLUMINE 529 MG/ML IV SOLN
15.0000 mL | Freq: Once | INTRAVENOUS | Status: AC | PRN
  Administered 2011-08-16: 15 mL via INTRAVENOUS

## 2011-08-16 NOTE — Progress Notes (Signed)
Patient with low heart rate between 45-50s on the monitor but asymptomatic; alert/oriented and denies any distress. Dr Radonna Ricker notified, no new orders given will continue to assess patient.

## 2011-08-16 NOTE — Progress Notes (Addendum)
Subjective: No complains. Continues to have diarhea Sleepy. No complains. No agitation Objective: Filed Vitals:   08/15/11 1700 08/15/11 1714 08/15/11 2135 08/16/11 0615  BP:  108/52 108/55 117/57  Pulse:   71 68  Temp: 98.5 F (36.9 C)  98.1 F (36.7 C) 98.7 F (37.1 C)  TempSrc: Oral  Oral Oral  Resp: 15  18 18   Height: 5\' 2"  (1.575 m)     Weight:      SpO2: 100%  100% 100%   Weight change:   Intake/Output Summary (Last 24 hours) at 08/16/11 0935 Last data filed at 08/16/11 0500  Gross per 24 hour  Intake 2058.33 ml  Output   1800 ml  Net 258.33 ml    General: Alert, awake, oriented 3, in no acute distress.  HEENT: No bruits, no goiter.  Heart: Regular rate and rhythm, without murmurs, rubs, gallops.  Lungs: Crackles left side, bilateral air movement.  Abdomen: Soft, nontender, nondistended, positive bowel sounds.  Neuro: Grossly intact, nonfocal.   Lab Results:  Basename 08/15/11 0600 08/14/11 1725  NA 125* 124*  K 5.0 3.5  CL 90* 88*  CO2 24 21  GLUCOSE 97 127*  BUN 13 13  CREATININE 1.05 0.99  CALCIUM 9.4 9.9  MG -- --  PHOS -- --   No results found for this basename: AST:2,ALT:2,ALKPHOS:2,BILITOT:2,PROT:2,ALBUMIN:2 in the last 72 hours No results found for this basename: LIPASE:2,AMYLASE:2 in the last 72 hours  Basename 08/15/11 0500 08/14/11 2040  WBC 7.3 7.0  NEUTROABS -- 6.3  HGB 11.4* 11.2*  HCT 32.4* 32.5*  MCV 79.0 79.1  PLT 203 229    Basename 08/14/11 1725  CKTOTAL 98  CKMB --  CKMBINDEX --  TROPONINI --   No components found with this basename: POCBNP:3 No results found for this basename: DDIMER:2 in the last 72 hours No results found for this basename: HGBA1C:2 in the last 72 hours  Basename 08/15/11 0600  CHOL 114  HDL 60  LDLCALC 41  TRIG 63  CHOLHDL 1.9  LDLDIRECT --    Basename 08/15/11 0600  TSH 0.517  T4TOTAL --  T3FREE --  THYROIDAB --   No results found for this basename:  VITAMINB12:2,FOLATE:2,FERRITIN:2,TIBC:2,IRON:2,RETICCTPCT:2 in the last 72 hours  Micro Results: No results found for this or any previous visit (from the past 240 hour(s)).  Studies/Results: Dg Chest 2 View  08/14/2011  *RADIOLOGY REPORT*  Clinical Data: Dizziness, weakness, altered mental status.  CHEST - 2 VIEW  Comparison: 08/02/2011  Findings: Prior CABG.  Mild cardiomegaly.  Low lung volumes.  No airspace opacities, effusions or edema.  No acute bony abnormality. Degenerative changes in the thoracic spine and shoulders.  IMPRESSION: Cardiomegaly.  Low lung volumes.  No acute findings.  Original Report Authenticated By: Cyndie Chime, M.D.   Ct Head Wo Contrast  08/14/2011  *RADIOLOGY REPORT*  Clinical Data:  Weakness.  Altered mental status.  CT HEAD WITHOUT CONTRAST CT CERVICAL SPINE WITHOUT CONTRAST  Technique:  Multidetector CT imaging of the head and cervical spine was performed following the standard protocol without intravenous contrast.  Multiplanar CT image reconstructions of the cervical spine were also generated.  Comparison:  08/04/2011  CT HEAD  Findings: Chronic ischemic changes and atrophy.  No mass effect, midline shift, or acute hemorrhage.  Mastoid air cells clear.  No fracture.  IMPRESSION: No acute intracranial pathology.  CT CERVICAL SPINE  Findings: No fracture.  No dislocation.  Anatomic alignment. Severe narrowing at C5-6 with posterior  osteophytic ridging. Uncovertebral osteophytes result in bilateral foraminal narrowing at this level.  There is mild spinal stenosis as the this level. Degenerative changes at other levels are less prominent.  No obvious soft tissue abnormality to suggest soft tissue injury.  1.2 cm hypodensity in the right thyroid gland.  IMPRESSION: No acute injury.  Degenerative change.  1.2 cm right thyroid hypodensity.  Thyroid ultrasound is recommended.  Original Report Authenticated By: Donavan Burnet, M.D.   Ct Cervical Spine Wo Contrast  08/14/2011   *RADIOLOGY REPORT*  Clinical Data:  Weakness.  Altered mental status.  CT HEAD WITHOUT CONTRAST CT CERVICAL SPINE WITHOUT CONTRAST  Technique:  Multidetector CT imaging of the head and cervical spine was performed following the standard protocol without intravenous contrast.  Multiplanar CT image reconstructions of the cervical spine were also generated.  Comparison:  08/04/2011  CT HEAD  Findings: Chronic ischemic changes and atrophy.  No mass effect, midline shift, or acute hemorrhage.  Mastoid air cells clear.  No fracture.  IMPRESSION: No acute intracranial pathology.  CT CERVICAL SPINE  Findings: No fracture.  No dislocation.  Anatomic alignment. Severe narrowing at C5-6 with posterior osteophytic ridging. Uncovertebral osteophytes result in bilateral foraminal narrowing at this level.  There is mild spinal stenosis as the this level. Degenerative changes at other levels are less prominent.  No obvious soft tissue abnormality to suggest soft tissue injury.  1.2 cm hypodensity in the right thyroid gland.  IMPRESSION: No acute injury.  Degenerative change.  1.2 cm right thyroid hypodensity.  Thyroid ultrasound is recommended.  Original Report Authenticated By: Donavan Burnet, M.D.   US Soft Tissue Head/neck  08/15/2011  *RADIOLOGY REPORT*  Clinical Data: Thyroid nodule.  THYROID ULTRASOUND  Technique: Ultrasound examination of the thyroid gland and adjacent soft tissues was performed.  Comparison:  06/03/2005  Findings:  Right thyroid lobe:  4.1 x 1.7 x 2.2 cm. Left thyroid lobe:  4.0 x 1.2 x 1.7 cm. Isthmus:  3 mm.  Focal nodules:  Dominant nodule is in the right thyroid lobe measuring 2.0 x 1.6 x 1.4 cm, predominately solid.  This previously measured maximally 1.6 cm.  Small subcentimeteric solid and cystic nodules are scattered throughout the left lobe, the largest measuring 4 mm.  Diffuse heterogeneous echotexture throughout the thyroid.  Lymphadenopathy:  None visualized.  IMPRESSION: Dominant nodules in  the right mid pole measuring up to 2.0 cm, predominately solid.  This has enlarged slightly since previous study 6 years ago when this measured 1.6 cm.  At that time, this contained cystic components.  Original Report Authenticated By: Cyndie Chime, M.D.    Medications: I have reviewed the patient's current medications.  Assessment and plan: Principal Problem:  *Syncope and collapse: She was admitted to the telemetry floor she is no events, she had one set of cardiac enzymes a normal CK and troponin I of 0.08. Her EKG shows sinus bradycardia, with a left axis deviation which is not new. She also has flipped T waves in V1 through V3 which are old and she has a first degree AV block. She is hyponatremic and hypochloremic, her creatinine is just slightly up from baseline. She is on hydrochlorothiazide at home. So the most likely cause of her syncope would be intravascular volume depletion along with diarrhea  -check a bet in am. KVO IV fluids, MRI and CT angio of neck rule out stroke and ICS stenosis.   CAD (coronary artery disease): She is not complaining of any  chest pain, continue aspirin.   Hyperlipidemia Continue home meds.   Hypothyroidism TSH is pending, continue Synthroid at current dose.   Hyponatremia Basic metabolic panel in am.  Diarrhea:  recent antibiotic use, check a C.dif.   Contacts: -bernadine: [phone] -Nephew (mark):  cel: [id number] Home: [id number]  LOS: 2 days   Marinda Elk M.D. Pager: [phone] [facility] 08/16/2011, 9:35 AM

## 2011-08-16 NOTE — Plan of Care (Signed)
Problem: Phase I Progression Outcomes Goal: OOB as tolerated unless otherwise ordered Outcome: Progressing Out of bed with assist uses the [facility]

## 2011-08-17 DIAGNOSIS — I10 Essential (primary) hypertension: Secondary | ICD-10-CM | POA: Diagnosis not present

## 2011-08-17 DIAGNOSIS — E871 Hypo-osmolality and hyponatremia: Secondary | ICD-10-CM | POA: Diagnosis not present

## 2011-08-17 DIAGNOSIS — R55 Syncope and collapse: Secondary | ICD-10-CM | POA: Diagnosis not present

## 2011-08-17 DIAGNOSIS — I25810 Atherosclerosis of coronary artery bypass graft(s) without angina pectoris: Secondary | ICD-10-CM | POA: Diagnosis not present

## 2011-08-17 LAB — BASIC METABOLIC PANEL
CO2: 26 mEq/L (ref 19–32)
Chloride: 102 mEq/L (ref 96–112)
Glucose, Bld: 83 mg/dL (ref 70–99)
Potassium: 3.6 mEq/L (ref 3.5–5.1)
Sodium: 134 mEq/L — ABNORMAL LOW (ref 135–145)

## 2011-08-17 LAB — GLUCOSE, CAPILLARY
Glucose-Capillary: 78 mg/dL (ref 70–99)
Glucose-Capillary: 117 mg/dL — ABNORMAL HIGH (ref 70–99)

## 2011-08-17 MED ORDER — VITAMINS A & D EX OINT
TOPICAL_OINTMENT | CUTANEOUS | Status: AC
  Administered 2011-08-17: 5
  Filled 2011-08-17: qty 5

## 2011-08-17 NOTE — Discharge Summary (Addendum)
Admit date: 08/14/2011 Discharge date: 08/18/2011  Primary Care Physician:  Burtis Junes, MD, MD   Discharge Diagnoses:   Active Hospital Problems  Diagnoses Date Noted   . HTN (hypertension) 08/17/2011   . HX: breast cancer 08/14/2011   . Hypothyroidism 08/14/2011   . CAD (coronary artery disease) 11/19/2010   . Hyperlipidemia 11/19/2010     Resolved Hospital Problems  Diagnoses Date Noted Date Resolved  . Syncope and collapse 08/14/2011 08/17/2011  . Hyponatremia 08/14/2011 08/17/2011     DISCHARGE MEDICATION: Medication List  As of 08/18/2011  8:10 AM   STOP taking these medications         acetaminophen 650 MG CR tablet      ciprofloxacin 500 MG tablet      magnesium hydroxide 400 MG/5ML suspension      metroNIDAZOLE 500 MG tablet      ondansetron 4 MG tablet      triamterene-hydrochlorothiazide 37.5-25 MG per tablet         TAKE these medications         acetaminophen 500 MG tablet   Commonly known as: TYLENOL   Take 500 mg by mouth every 6 (six) hours as needed. For pain      aspirin 81 MG tablet   Take 162 mg by mouth daily.      GAS-X PO   Take 1 capsule by mouth daily as needed. For bloating      levothyroxine 50 MCG tablet   Commonly known as: SYNTHROID, LEVOTHROID   Take 50 mcg by mouth daily.      pravastatin 40 MG tablet   Commonly known as: PRAVACHOL   Take 40 mg by mouth daily.      ramipril 10 MG tablet   Commonly known as: ALTACE   Take 10 mg by mouth daily.      SYSTANE OP   Place 2 drops into both eyes as needed. For dryness.              Consults:     SIGNIFICANT DIAGNOSTIC STUDIES:  Dg Chest 2 View  08/14/2011  *RADIOLOGY REPORT*  Clinical Data: Dizziness, weakness, altered mental status.  CHEST - 2 VIEW  Comparison: 08/02/2011  Findings: Prior CABG.  Mild cardiomegaly.  Low lung volumes.  No airspace opacities, effusions or edema.  No acute bony abnormality. Degenerative changes in the thoracic spine and  shoulders.  IMPRESSION: Cardiomegaly.  Low lung volumes.  No acute findings.  Original Report Authenticated By: Cyndie Chime, M.D.   Dg Chest 2 View  08/02/2011  *RADIOLOGY REPORT*  Clinical Data: Shooting pains of the mid chest.  Began this morning.  History of heart attack and five vessel bypass.  No shortness of breath.  CHEST - 2 VIEW  Comparison: Chest x-ray 03/01/2011  Findings: The patient has had median sternotomy CABG.  Heart is enlarged.  No focal consolidations or pleural effusions.  Mild atelectasis or scar left lung base.  No edema.  Degenerative changes are seen in the spine.  IMPRESSION:  1.  Postoperative changes. 2.  Cardiomegaly without pulmonary edema.  Original Report Authenticated By: Patterson Hammersmith, M.D.   Ct Head Wo Contrast  08/14/2011  *RADIOLOGY REPORT*  Clinical Data:  Weakness.  Altered mental status.  CT HEAD WITHOUT CONTRAST CT CERVICAL SPINE WITHOUT CONTRAST  Technique:  Multidetector CT imaging of the head and cervical spine was performed following the standard protocol without intravenous contrast.  Multiplanar CT image reconstructions of the  cervical spine were also generated.  Comparison:  08/04/2011  CT HEAD  Findings: Chronic ischemic changes and atrophy.  No mass effect, midline shift, or acute hemorrhage.  Mastoid air cells clear.  No fracture.  IMPRESSION: No acute intracranial pathology.  CT CERVICAL SPINE  Findings: No fracture.  No dislocation.  Anatomic alignment. Severe narrowing at C5-6 with posterior osteophytic ridging. Uncovertebral osteophytes result in bilateral foraminal narrowing at this level.  There is mild spinal stenosis as the this level. Degenerative changes at other levels are less prominent.  No obvious soft tissue abnormality to suggest soft tissue injury.  1.2 cm hypodensity in the right thyroid gland.  IMPRESSION: No acute injury.  Degenerative change.  1.2 cm right thyroid hypodensity.  Thyroid ultrasound is recommended.  Original Report  Authenticated By: Donavan Burnet, M.D.   Ct Head Wo Contrast  08/04/2011  *RADIOLOGY REPORT*  Clinical Data: Dizziness with vomiting.  CT HEAD WITHOUT CONTRAST  Technique:  Contiguous axial images were obtained from the base of the skull through the vertex without contrast.  Comparison: 11/17/2009  Findings: There is no evidence for acute hemorrhage, hydrocephalus, mass lesion, or abnormal extra-axial fluid collection.  No definite CT evidence for acute infarction.  Patchy low attenuation in the deep hemispheric and periventricular white matter is nonspecific, but likely reflects chronic microvascular ischemic demyelination. The visualized paranasal sinuses and mastoid air cells are clear.  IMPRESSION: Stable.  Chronic small vessel white matter disease without acute intracranial findings.  Original Report Authenticated By: ERIC A. MANSELL, M.D.   Ct Cervical Spine Wo Contrast  08/14/2011  *RADIOLOGY REPORT*  Clinical Data:  Weakness.  Altered mental status.  CT HEAD WITHOUT CONTRAST CT CERVICAL SPINE WITHOUT CONTRAST  Technique:  Multidetector CT imaging of the head and cervical spine was performed following the standard protocol without intravenous contrast.  Multiplanar CT image reconstructions of the cervical spine were also generated.  Comparison:  08/04/2011  CT HEAD  Findings: Chronic ischemic changes and atrophy.  No mass effect, midline shift, or acute hemorrhage.  Mastoid air cells clear.  No fracture.  IMPRESSION: No acute intracranial pathology.  CT CERVICAL SPINE  Findings: No fracture.  No dislocation.  Anatomic alignment. Severe narrowing at C5-6 with posterior osteophytic ridging. Uncovertebral osteophytes result in bilateral foraminal narrowing at this level.  There is mild spinal stenosis as the this level. Degenerative changes at other levels are less prominent.  No obvious soft tissue abnormality to suggest soft tissue injury.  1.2 cm hypodensity in the right thyroid gland.  IMPRESSION: No  acute injury.  Degenerative change.  1.2 cm right thyroid hypodensity.  Thyroid ultrasound is recommended.  Original Report Authenticated By: Donavan Burnet, M.D.   Mr [facility] Wo Contrast  08/16/2011  *RADIOLOGY REPORT*  Clinical Data:  Falls and weakness.  Rule out stroke.  MRI HEAD WITHOUT CONTRAST MRA HEAD WITHOUT CONTRAST MRA NECK WITHOUT AND WITH CONTRAST  Technique:  Multiplanar, multiecho pulse sequences of the brain and surrounding structures were obtained without intravenous contrast. Angiographic images of the Circle of Willis were obtained using MRA technique without intravenous contrast.  Angiographic images of the neck were obtained using MRA technique without and with intravenous contrast.  Carotid stenosis measurements (when applicable) are obtained utilizing NASCET criteria, using the distal internal carotid diameter as the denominator.  Contrast: 15mL MULTIHANCE GADOBENATE DIMEGLUMINE 529 MG/ML IV SOLN  Comparison:  CT head without contrast 08/14/2011.  MRI HEAD  Findings:  The diffusion weighted images demonstrate  no evidence for acute or subacute infarction.  Periventricular and subcortical white matter changes are present bilaterally.  The ventricles are proportionate to the degree of atrophy.  No significant extra-axial fluid collection is present.  Flow is present in the major intracranial arteries.  The globes orbits are intact.  The paranasal sinuses mastoid air cells are clear.  IMPRESSION:  1.  No acute intracranial abnormality. 2.  Mild atrophy white matter disease.  This likely reflects the sequelae of chronic microvascular ischemia.  MRA HEAD  Findings: The internal carotid arteries are within normal limits from the high cervical segments through the ICA termini.  The A1 and M1 segments are normal.  ACA and MCA branch vessels are within normal limits.  The left PICA origin is visualized and within normal limits.  The right PICA origin is below the field of view.  The left vertebral  artery is slightly dominant.  The basilar artery is within normal limits.  The posterior cerebral arteries both originate from the basilar tip.  The PCA branch vessels are within normal limits for age.  IMPRESSION: Normal variant MRA circle of Willis without evidence for significant proximal stenosis, aneurysm, or branch vessel occlusion.  MRA NECK  Findings: The time-of-flight images demonstrate no significant flow disturbance at either carotid bifurcation.  Flow is antegrade in the vertebral arteries bilaterally.  The postcontrast images demonstrate a standard three-vessel arch configuration.  Both posterior cerebral arteries originate from the subclavian arteries.  The left vertebral artery is the dominant vessel.  No focal stenosis is present.  The right common carotid artery is within normal limits.  The bifurcation is unremarkable.  There is no focal stenosis.  The right internal carotid artery is within normal limits.  The left common carotid artery is unremarkable.  There is minimal irregularity proximal left internal carotid artery without significant stenosis relative to the distal vessel.  IMPRESSION: No significant carotid or vertebral artery disease.  Original Report Authenticated By: Jamesetta Orleans. MATTERN, M.D.   Mr Angiogram Neck W Wo Contrast  08/16/2011  *RADIOLOGY REPORT*  Clinical Data:  Falls and weakness.  Rule out stroke.  MRI HEAD WITHOUT CONTRAST MRA HEAD WITHOUT CONTRAST MRA NECK WITHOUT AND WITH CONTRAST  Technique:  Multiplanar, multiecho pulse sequences of the brain and surrounding structures were obtained without intravenous contrast. Angiographic images of the Circle of Willis were obtained using MRA technique without intravenous contrast.  Angiographic images of the neck were obtained using MRA technique without and with intravenous contrast.  Carotid stenosis measurements (when applicable) are obtained utilizing NASCET criteria, using the distal internal carotid diameter as the  denominator.  Contrast: 15mL MULTIHANCE GADOBENATE DIMEGLUMINE 529 MG/ML IV SOLN  Comparison:  CT head without contrast 08/14/2011.  MRI HEAD  Findings:  The diffusion weighted images demonstrate no evidence for acute or subacute infarction.  Periventricular and subcortical white matter changes are present bilaterally.  The ventricles are proportionate to the degree of atrophy.  No significant extra-axial fluid collection is present.  Flow is present in the major intracranial arteries.  The globes orbits are intact.  The paranasal sinuses mastoid air cells are clear.  IMPRESSION:  1.  No acute intracranial abnormality. 2.  Mild atrophy white matter disease.  This likely reflects the sequelae of chronic microvascular ischemia.  MRA HEAD  Findings: The internal carotid arteries are within normal limits from the high cervical segments through the ICA termini.  The A1 and M1 segments are normal.  ACA and MCA branch vessels  are within normal limits.  The left PICA origin is visualized and within normal limits.  The right PICA origin is below the field of view.  The left vertebral artery is slightly dominant.  The basilar artery is within normal limits.  The posterior cerebral arteries both originate from the basilar tip.  The PCA branch vessels are within normal limits for age.  IMPRESSION: Normal variant MRA circle of Willis without evidence for significant proximal stenosis, aneurysm, or branch vessel occlusion.  MRA NECK  Findings: The time-of-flight images demonstrate no significant flow disturbance at either carotid bifurcation.  Flow is antegrade in the vertebral arteries bilaterally.  The postcontrast images demonstrate a standard three-vessel arch configuration.  Both posterior cerebral arteries originate from the subclavian arteries.  The left vertebral artery is the dominant vessel.  No focal stenosis is present.  The right common carotid artery is within normal limits.  The bifurcation is unremarkable.  There  is no focal stenosis.  The right internal carotid artery is within normal limits.  The left common carotid artery is unremarkable.  There is minimal irregularity proximal left internal carotid artery without significant stenosis relative to the distal vessel.  IMPRESSION: No significant carotid or vertebral artery disease.  Original Report Authenticated By: Jamesetta Orleans. MATTERN, M.D.   Mr Brain Wo Contrast  08/16/2011  *RADIOLOGY REPORT*  Clinical Data:  Falls and weakness.  Rule out stroke.  MRI HEAD WITHOUT CONTRAST MRA HEAD WITHOUT CONTRAST MRA NECK WITHOUT AND WITH CONTRAST  Technique:  Multiplanar, multiecho pulse sequences of the brain and surrounding structures were obtained without intravenous contrast. Angiographic images of the Circle of Willis were obtained using MRA technique without intravenous contrast.  Angiographic images of the neck were obtained using MRA technique without and with intravenous contrast.  Carotid stenosis measurements (when applicable) are obtained utilizing NASCET criteria, using the distal internal carotid diameter as the denominator.  Contrast: 15mL MULTIHANCE GADOBENATE DIMEGLUMINE 529 MG/ML IV SOLN  Comparison:  CT head without contrast 08/14/2011.  MRI HEAD  Findings:  The diffusion weighted images demonstrate no evidence for acute or subacute infarction.  Periventricular and subcortical white matter changes are present bilaterally.  The ventricles are proportionate to the degree of atrophy.  No significant extra-axial fluid collection is present.  Flow is present in the major intracranial arteries.  The globes orbits are intact.  The paranasal sinuses mastoid air cells are clear.  IMPRESSION:  1.  No acute intracranial abnormality. 2.  Mild atrophy white matter disease.  This likely reflects the sequelae of chronic microvascular ischemia.  MRA HEAD  Findings: The internal carotid arteries are within normal limits from the high cervical segments through the ICA termini.   The A1 and M1 segments are normal.  ACA and MCA branch vessels are within normal limits.  The left PICA origin is visualized and within normal limits.  The right PICA origin is below the field of view.  The left vertebral artery is slightly dominant.  The basilar artery is within normal limits.  The posterior cerebral arteries both originate from the basilar tip.  The PCA branch vessels are within normal limits for age.  IMPRESSION: Normal variant MRA circle of Willis without evidence for significant proximal stenosis, aneurysm, or branch vessel occlusion.  MRA NECK  Findings: The time-of-flight images demonstrate no significant flow disturbance at either carotid bifurcation.  Flow is antegrade in the vertebral arteries bilaterally.  The postcontrast images demonstrate a standard three-vessel arch configuration.  Both posterior cerebral  arteries originate from the subclavian arteries.  The left vertebral artery is the dominant vessel.  No focal stenosis is present.  The right common carotid artery is within normal limits.  The bifurcation is unremarkable.  There is no focal stenosis.  The right internal carotid artery is within normal limits.  The left common carotid artery is unremarkable.  There is minimal irregularity proximal left internal carotid artery without significant stenosis relative to the distal vessel.  IMPRESSION: No significant carotid or vertebral artery disease.  Original Report Authenticated By: Jamesetta Orleans. MATTERN, M.D.   US Soft Tissue Head/neck  08/15/2011  *RADIOLOGY REPORT*  Clinical Data: Thyroid nodule.  THYROID ULTRASOUND  Technique: Ultrasound examination of the thyroid gland and adjacent soft tissues was performed.  Comparison:  06/03/2005  Findings:  Right thyroid lobe:  4.1 x 1.7 x 2.2 cm. Left thyroid lobe:  4.0 x 1.2 x 1.7 cm. Isthmus:  3 mm.  Focal nodules:  Dominant nodule is in the right thyroid lobe measuring 2.0 x 1.6 x 1.4 cm, predominately solid.  This previously  measured maximally 1.6 cm.  Small subcentimeteric solid and cystic nodules are scattered throughout the left lobe, the largest measuring 4 mm.  Diffuse heterogeneous echotexture throughout the thyroid.  Lymphadenopathy:  None visualized.  IMPRESSION: Dominant nodules in the right mid pole measuring up to 2.0 cm, predominately solid.  This has enlarged slightly since previous study 6 years ago when this measured 1.6 cm.  At that time, this contained cystic components.  Original Report Authenticated By: Cyndie Chime, M.D.   Ct Abdomen Pelvis W Contrast  08/04/2011  *RADIOLOGY REPORT*  Clinical Data: Vomiting and abdominal pain.  CT ABDOMEN AND PELVIS WITH CONTRAST  Technique:  Multidetector CT imaging of the abdomen and pelvis was performed following the standard protocol during bolus administration of intravenous contrast.  Contrast: OMNIPAQUE IOHEXOL 300 MG/ML IJ SOLN  Comparison: None.  Findings: There is some bronchiectasis with probable atelectasis in the posterior lung bases bilaterally.  No focal abnormalities seen in the liver or spleen.  The small hiatal hernia noted.  Stomach is otherwise unremarkable.  The duodenum, pancreas, and adrenal glands are unremarkable.  Layering calcified stones are seen in the lumen of the gallbladder.  There is no intra or extrahepatic biliary duct dilatation.  Tiny low-density cortical lesions identified in each kidney, likely representing cysts.  No abdominal aortic aneurysm.  There is no free fluid or lymphadenopathy in the abdomen.  No evidence for bowel obstruction in the abdomen.  Imaging through the pelvis shows no free intraperitoneal fluid.  No pelvic sidewall lymphadenopathy.  Bladder is unremarkable.  Fibroid changes noted in the uterus.  There is no adnexal mass.  Prominent diverticular changes are seen in the sigmoid colon. There is some edema and / or inflammation along the mid sigmoid colon suggesting diverticulitis.  No evidence for perforation or  abscess at this time.  The colonic lumen is diffusely fluid-filled, down to the level of the rectum, suggesting diarrhea.  Terminal ileum is normal.  The appendix is normal.  A left hip lipoma is seen within the gluteus musculature.  Bone windows show a medullary lesion in the proximal left femur.  IMPRESSION: Advanced diverticulosis in the left colon with edema/inflammation adjacent to the midsigmoid segment suggesting diverticulitis.  No evidence for perforation or abscess at this time.  Follow-up is recommended as colonic neoplasm can present with similar imaging features.  Large lucent lesion in the proximal left femur.  This was visualized on a plain film exam from 01/01/2006 and the visualized portion is unchanged.  This interval stability is consistent with benign process.  Original Report Authenticated By: ERIC A. MANSELL, M.D.   Dg Abd Acute W/chest  08/04/2011  *RADIOLOGY REPORT*  Clinical Data: Nausea/vomiting, weakness  ACUTE ABDOMEN SERIES (ABDOMEN 2 VIEW & CHEST 1 VIEW)  Comparison: Chest radiograph dated 08/02/2011  Findings: Lungs are essentially clear. No pleural effusion or pneumothorax.  The heart is top normal in size. Postsurgical changes related to prior CABG.  Paucity of bowel gas, without disproportionate small bowel dilatation to suggest small bowel obstruction.  No evidence of free air under the diaphragm on the upright view.  Calcified uterine fibroid in the right pelvis.  Degenerative changes of the visualized thoracolumbar spine.  IMPRESSION: No evidence of acute cardiopulmonary disease.  No evidence of small bowel obstruction or free air.  Original Report Authenticated By: Charline Bills, M.D.    Carotid Dopplers: No significant extracranial carotid artery stenosis demonstrated. Vertebrals are patent with antegrade flow.      Recent Results (from the past 240 hour(s))  CLOSTRIDIUM DIFFICILE BY PCR     Status: Normal   Collection Time   08/16/11 11:38 AM      Component  Value Range Status Comment   C difficile by pcr NEGATIVE  NEGATIVE  Final     BRIEF ADMITTING H & P: 78 year old female who lives alone, she was found on the floor today. History provided by patient's power of attorney Natalie Bullock [phone]. Patient was of usual state of health was seen earlier today, later found down. Patient had total amnesia surrounding the event. She is unable to provide any history. Additionally patient did become acutely confused lately while in the ER, she received Geodon and is sleeping soundly. Prior to this per power of attorney patient has not been confused.  Per patient's officer this is the patient's third syncopal episode in approximately 3-4 weeks. On the first occasion she did complain of chest pains, other than this patient has been asymptomatic. There is no report of any fever, any chills. There is report of a nausea and vomiting once and a single episode of diarrhea today. There is report that patient is dizzy. Here the ER the patient was per weak, to the point where they were not able to stand her to check orthostatic vitals.   Active Hospital Problems  Diagnoses Date Noted   . HTN (hypertension): Her beta blocker was continued her hydrochlorothiazide/triamterene were stopped as her blood pressure was low and in combination with her diarrhea was causing her to have syncopal episodes. She will followup with her cardiologist as an outpatient and restart these medications as tolerated.  08/17/2011   . HX: breast cancer: Stable no changes were made.  08/14/2011   . Hypothyroidism: Her TSH was checked was 0.5. A thyroid ultrasound was done that showed Dominant nodules in the right mid pole measuring up to 2.0 cm, predominately solid.  This has enlarged slightly since previous study 6 years ago when this measured 1.6 cm. as we follow primary care doctor as an outpatient.  08/14/2011   . CAD (coronary artery disease): Stable no changes were made.  11/19/2010       Resolved Hospital Problems  Diagnoses Date Noted Date Resolved  . Syncope and collapse, She was treated empirically with ciprofloxacin and Flagyl for diverticulosis and was having diarrhea for the last 3 weeks she continue to take her hydrochlorothiazide/triamterene for  her blood pressure. As cause her to be significantly dehydrated and probably orthostatic, as she also related dizziness upon standing. Also on her b- met on admission her creatinine was slightly high she was hyponatremic and her chloride was low pointing toward decreased intravascular volume. Hydrochlorothiazide and 20 entering were stopped she was given IV fluids and she had no further episodes of syncope or orthostasis. She was monitored on telemetry with no events cardiac enzymes first set was negative. MRI of the head that showed no acute stroke and MRA that showed no ICA stenosis. Physical therapy evaluated her recommended home PT. So the most likely cause of her syncopal as this [place] orthostatic hypotension secondary to diarrhea and ongoing medication use.  08/14/2011 08/17/2011  . Hyponatremia: This probably secondary to dehydration does resolve with IV fluids.  08/14/2011 08/17/2011     Disposition and Follow-up:  Discharge Orders    Future Orders Please Complete By Expires   Diet - low sodium heart healthy      Increase activity slowly        Follow-up Information    Follow up with Burtis Junes, MD in 1 week. (To followup on his thyroid cystic nodule)           DISCHARGE EXAM:   General: Alert, awake, oriented 3, in no acute distress.  HEENT: No bruits, no goiter.  Heart: Regular rate and rhythm, without murmurs, rubs, gallops.  Lungs: Crackles left side, bilateral air movement.  Abdomen: Soft, nontender, nondistended, positive bowel sounds.  Neuro: Grossly intact, nonfocal.  Blood pressure 110/61, pulse 67, temperature 98.6 F (37 C), temperature source Oral, resp. rate 20, height 5\' 2"  (1.575 m),  weight 64.9 kg (143 lb 1.3 oz), SpO2 94.00%.   Basename 08/17/11 0540  NA 134*  K 3.6  CL 102  CO2 26  GLUCOSE 83  BUN 13  CREATININE 0.82  CALCIUM 9.0  MG --  PHOS --   No results found for this basename: AST:2,ALT:2,ALKPHOS:2,BILITOT:2,PROT:2,ALBUMIN:2 in the last 72 hours No results found for this basename: LIPASE:2,AMYLASE:2 in the last 72 hours No results found for this basename: WBC:2,NEUTROABS:2,HGB:2,HCT:2,MCV:2,PLT:2 in the last 72 hours  Signed: Marinda Elk M.D. 08/18/2011, 8:10 AM

## 2011-08-17 NOTE — Progress Notes (Signed)
Subjective: No complains. diarhea resolved  Objective: Filed Vitals:   08/16/11 1814 08/16/11 2137 08/17/11 0203 08/17/11 0611  BP: 126/72 152/70 108/58 125/65  Pulse: 64 71 62 63  Temp: 97.9 F (36.6 C) 98.5 F (36.9 C) 98.6 F (37 C) 98.3 F (36.8 C)  TempSrc: Oral Oral Oral Oral  Resp: 17 18 18 18   Height:      Weight:      SpO2: 99% 97% 98% 97%   Weight change:   Intake/Output Summary (Last 24 hours) at 08/17/11 0747 Last data filed at 08/17/11 4098  Gross per 24 hour  Intake    480 ml  Output   1703 ml  Net  -1223 ml    General: Alert, awake, oriented 3, in no acute distress.  HEENT: No bruits, no goiter.  Heart: Regular rate and rhythm, without murmurs, rubs, gallops.  Lungs: Crackles left side, bilateral air movement.  Abdomen: Soft, nontender, nondistended, positive bowel sounds.  Neuro: Grossly intact, nonfocal.   Lab Results:  Basename 08/17/11 0540 08/15/11 0600 08/14/11 1725  NA 134* 125* 124*  K 3.6 5.0 3.5  CL 102 90* 88*  CO2 26 24 21   GLUCOSE 83 97 127*  BUN 13 13 13   CREATININE 0.82 1.05 0.99  CALCIUM 9.0 9.4 9.9  MG -- -- --  PHOS -- -- --   No results found for this basename: AST:2,ALT:2,ALKPHOS:2,BILITOT:2,PROT:2,ALBUMIN:2 in the last 72 hours No results found for this basename: LIPASE:2,AMYLASE:2 in the last 72 hours  Basename 08/15/11 0500 08/14/11 2040  WBC 7.3 7.0  NEUTROABS -- 6.3  HGB 11.4* 11.2*  HCT 32.4* 32.5*  MCV 79.0 79.1  PLT 203 229    Basename 08/14/11 1725  CKTOTAL 98  CKMB --  CKMBINDEX --  TROPONINI --   No components found with this basename: POCBNP:3 No results found for this basename: DDIMER:2 in the last 72 hours  Basename 08/15/11 0600  HGBA1C 5.4    Basename 08/15/11 0600  CHOL 114  HDL 60  LDLCALC 41  TRIG 63  CHOLHDL 1.9  LDLDIRECT --    Basename 08/15/11 0600  TSH 0.517  T4TOTAL --  T3FREE --  THYROIDAB --   No results found for this basename:  VITAMINB12:2,FOLATE:2,FERRITIN:2,TIBC:2,IRON:2,RETICCTPCT:2 in the last 72 hours  Micro Results: Recent Results (from the past 240 hour(s))  CLOSTRIDIUM DIFFICILE BY PCR     Status: Normal   Collection Time   08/16/11 11:38 AM      Component Value Range Status Comment   C difficile by pcr NEGATIVE  NEGATIVE  Final     Studies/Results: Mr Shirlee Latch Wo Contrast  08/16/2011  *RADIOLOGY REPORT*  Clinical Data:  Falls and weakness.  Rule out stroke.  MRI HEAD WITHOUT CONTRAST MRA HEAD WITHOUT CONTRAST MRA NECK WITHOUT AND WITH CONTRAST  Technique:  Multiplanar, multiecho pulse sequences of the brain and surrounding structures were obtained without intravenous contrast. Angiographic images of the Circle of Willis were obtained using MRA technique without intravenous contrast.  Angiographic images of the neck were obtained using MRA technique without and with intravenous contrast.  Carotid stenosis measurements (when applicable) are obtained utilizing NASCET criteria, using the distal internal carotid diameter as the denominator.  Contrast: 15mL MULTIHANCE GADOBENATE DIMEGLUMINE 529 MG/ML IV SOLN  Comparison:  CT head without contrast 08/14/2011.  MRI HEAD  Findings:  The diffusion weighted images demonstrate no evidence for acute or subacute infarction.  Periventricular and subcortical white matter changes are present bilaterally.  The ventricles are proportionate to the degree of atrophy.  No significant extra-axial fluid collection is present.  Flow is present in the major intracranial arteries.  The globes orbits are intact.  The paranasal sinuses mastoid air cells are clear.  IMPRESSION:  1.  No acute intracranial abnormality. 2.  Mild atrophy white matter disease.  This likely reflects the sequelae of chronic microvascular ischemia.  MRA HEAD  Findings: The internal carotid arteries are within normal limits from the high cervical segments through the ICA termini.  The A1 and M1 segments are normal.  ACA  and MCA branch vessels are within normal limits.  The left PICA origin is visualized and within normal limits.  The right PICA origin is below the field of view.  The left vertebral artery is slightly dominant.  The basilar artery is within normal limits.  The posterior cerebral arteries both originate from the basilar tip.  The PCA branch vessels are within normal limits for age.  IMPRESSION: Normal variant MRA circle of Willis without evidence for significant proximal stenosis, aneurysm, or branch vessel occlusion.  MRA NECK  Findings: The time-of-flight images demonstrate no significant flow disturbance at either carotid bifurcation.  Flow is antegrade in the vertebral arteries bilaterally.  The postcontrast images demonstrate a standard three-vessel arch configuration.  Both posterior cerebral arteries originate from the subclavian arteries.  The left vertebral artery is the dominant vessel.  No focal stenosis is present.  The right common carotid artery is within normal limits.  The bifurcation is unremarkable.  There is no focal stenosis.  The right internal carotid artery is within normal limits.  The left common carotid artery is unremarkable.  There is minimal irregularity proximal left internal carotid artery without significant stenosis relative to the distal vessel.  IMPRESSION: No significant carotid or vertebral artery disease.  Original Report Authenticated By: Jamesetta Orleans. MATTERN, M.D.   Mr Angiogram Neck W Wo Contrast  08/16/2011  *RADIOLOGY REPORT*  Clinical Data:  Falls and weakness.  Rule out stroke.  MRI HEAD WITHOUT CONTRAST MRA HEAD WITHOUT CONTRAST MRA NECK WITHOUT AND WITH CONTRAST  Technique:  Multiplanar, multiecho pulse sequences of the brain and surrounding structures were obtained without intravenous contrast. Angiographic images of the Circle of Willis were obtained using MRA technique without intravenous contrast.  Angiographic images of the neck were obtained using MRA  technique without and with intravenous contrast.  Carotid stenosis measurements (when applicable) are obtained utilizing NASCET criteria, using the distal internal carotid diameter as the denominator.  Contrast: 15mL MULTIHANCE GADOBENATE DIMEGLUMINE 529 MG/ML IV SOLN  Comparison:  CT head without contrast 08/14/2011.  MRI HEAD  Findings:  The diffusion weighted images demonstrate no evidence for acute or subacute infarction.  Periventricular and subcortical white matter changes are present bilaterally.  The ventricles are proportionate to the degree of atrophy.  No significant extra-axial fluid collection is present.  Flow is present in the major intracranial arteries.  The globes orbits are intact.  The paranasal sinuses mastoid air cells are clear.  IMPRESSION:  1.  No acute intracranial abnormality. 2.  Mild atrophy white matter disease.  This likely reflects the sequelae of chronic microvascular ischemia.  MRA HEAD  Findings: The internal carotid arteries are within normal limits from the high cervical segments through the ICA termini.  The A1 and M1 segments are normal.  ACA and MCA branch vessels are within normal limits.  The left PICA origin is visualized and within normal limits.  The right  PICA origin is below the field of view.  The left vertebral artery is slightly dominant.  The basilar artery is within normal limits.  The posterior cerebral arteries both originate from the basilar tip.  The PCA branch vessels are within normal limits for age.  IMPRESSION: Normal variant MRA circle of Willis without evidence for significant proximal stenosis, aneurysm, or branch vessel occlusion.  MRA NECK  Findings: The time-of-flight images demonstrate no significant flow disturbance at either carotid bifurcation.  Flow is antegrade in the vertebral arteries bilaterally.  The postcontrast images demonstrate a standard three-vessel arch configuration.  Both posterior cerebral arteries originate from the subclavian  arteries.  The left vertebral artery is the dominant vessel.  No focal stenosis is present.  The right common carotid artery is within normal limits.  The bifurcation is unremarkable.  There is no focal stenosis.  The right internal carotid artery is within normal limits.  The left common carotid artery is unremarkable.  There is minimal irregularity proximal left internal carotid artery without significant stenosis relative to the distal vessel.  IMPRESSION: No significant carotid or vertebral artery disease.  Original Report Authenticated By: Jamesetta Orleans. MATTERN, M.D.   Mr Brain Wo Contrast  08/16/2011  *RADIOLOGY REPORT*  Clinical Data:  Falls and weakness.  Rule out stroke.  MRI HEAD WITHOUT CONTRAST MRA HEAD WITHOUT CONTRAST MRA NECK WITHOUT AND WITH CONTRAST  Technique:  Multiplanar, multiecho pulse sequences of the brain and surrounding structures were obtained without intravenous contrast. Angiographic images of the Circle of Willis were obtained using MRA technique without intravenous contrast.  Angiographic images of the neck were obtained using MRA technique without and with intravenous contrast.  Carotid stenosis measurements (when applicable) are obtained utilizing NASCET criteria, using the distal internal carotid diameter as the denominator.  Contrast: 15mL MULTIHANCE GADOBENATE DIMEGLUMINE 529 MG/ML IV SOLN  Comparison:  CT head without contrast 08/14/2011.  MRI HEAD  Findings:  The diffusion weighted images demonstrate no evidence for acute or subacute infarction.  Periventricular and subcortical white matter changes are present bilaterally.  The ventricles are proportionate to the degree of atrophy.  No significant extra-axial fluid collection is present.  Flow is present in the major intracranial arteries.  The globes orbits are intact.  The paranasal sinuses mastoid air cells are clear.  IMPRESSION:  1.  No acute intracranial abnormality. 2.  Mild atrophy white matter disease.  This likely  reflects the sequelae of chronic microvascular ischemia.  MRA HEAD  Findings: The internal carotid arteries are within normal limits from the high cervical segments through the ICA termini.  The A1 and M1 segments are normal.  ACA and MCA branch vessels are within normal limits.  The left PICA origin is visualized and within normal limits.  The right PICA origin is below the field of view.  The left vertebral artery is slightly dominant.  The basilar artery is within normal limits.  The posterior cerebral arteries both originate from the basilar tip.  The PCA branch vessels are within normal limits for age.  IMPRESSION: Normal variant MRA circle of Willis without evidence for significant proximal stenosis, aneurysm, or branch vessel occlusion.  MRA NECK  Findings: The time-of-flight images demonstrate no significant flow disturbance at either carotid bifurcation.  Flow is antegrade in the vertebral arteries bilaterally.  The postcontrast images demonstrate a standard three-vessel arch configuration.  Both posterior cerebral arteries originate from the subclavian arteries.  The left vertebral artery is the dominant vessel.  No focal  stenosis is present.  The right common carotid artery is within normal limits.  The bifurcation is unremarkable.  There is no focal stenosis.  The right internal carotid artery is within normal limits.  The left common carotid artery is unremarkable.  There is minimal irregularity proximal left internal carotid artery without significant stenosis relative to the distal vessel.  IMPRESSION: No significant carotid or vertebral artery disease.  Original Report Authenticated By: Jamesetta Orleans. MATTERN, M.D.   US Soft Tissue Head/neck  08/15/2011  *RADIOLOGY REPORT*  Clinical Data: Thyroid nodule.  THYROID ULTRASOUND  Technique: Ultrasound examination of the thyroid gland and adjacent soft tissues was performed.  Comparison:  06/03/2005  Findings:  Right thyroid lobe:  4.1 x 1.7 x 2.2 cm.  Left thyroid lobe:  4.0 x 1.2 x 1.7 cm. Isthmus:  3 mm.  Focal nodules:  Dominant nodule is in the right thyroid lobe measuring 2.0 x 1.6 x 1.4 cm, predominately solid.  This previously measured maximally 1.6 cm.  Small subcentimeteric solid and cystic nodules are scattered throughout the left lobe, the largest measuring 4 mm.  Diffuse heterogeneous echotexture throughout the thyroid.  Lymphadenopathy:  None visualized.  IMPRESSION: Dominant nodules in the right mid pole measuring up to 2.0 cm, predominately solid.  This has enlarged slightly since previous study 6 years ago when this measured 1.6 cm.  At that time, this contained cystic components.  Original Report Authenticated By: Cyndie Chime, M.D.    Medications: I have reviewed the patient's current medications.  Assessment and plan: Principal Problem:  *Syncope and collapse: She was admitted to the telemetry floor she is no events,  She is hyponatremic and hypochloremic, her creatinine is just slightly up from baseline. She is on hydrochlorothiazide at home. So the most likely cause of her syncope would be intravascular volume depletion along with diarrhea  -check a bet in am. KVO IV fluids, MRI/MRA ho head and neck showed no stroke no ICA stenosis.   CAD (coronary artery disease): She is not complaining of any chest pain, continue aspirin.   Hyperlipidemia Continue home meds.   Hypothyroidism TSH is pending, continue Synthroid at current dose.   Hyponatremia Basic metabolic panel in am.  Diarrhea:  resolved c. Dif negative.   Contacts: -bernadine: [phone] -Nephew (mark):  cel: [phone] Home: [id number]  LOS: 3 days   Marinda Elk M.D. Pager: [phone] [facility] 08/17/2011, 7:47 AM

## 2011-08-17 NOTE — Evaluation (Signed)
Physical Therapy Evaluation Patient Details Name: Natalie Bullock MRN: [id number] DOB: 1925-01-09 Today's Date: 08/17/2011  Problem List:  Patient Active Problem List  Diagnoses  . CAD (coronary artery disease)  . Hyperlipidemia  . HX: breast cancer  . Hypothyroidism  . HTN (hypertension)    Past Medical History:  Past Medical History  Diagnosis Date  . IHD (ischemic heart disease)     with CABG in August 2002  . Hypertension   . Hypothyroidism   . Vitamin d deficiency   . Hyperlipidemia   . Anemia   . Hx of CABG     pt states she had 5 bypasses  . Breast cancer 1975    Right sided mastectomy  . Coronary artery disease   . Myocardial infarction     Non Q-wave MI  . Arthritis     osteoarthritis  . H/O hypokalemia    Past Surgical History:  Past Surgical History  Procedure Date  . Coronary artery bypass graft 12/2000    LEFT INTERNAL MAMMARY TO THE LAD, SAPHENOUS VEIN GRAFT TO THE FIRST DIAGONAL, SAPHENOUS VEIN GRAFT TO THE RAMUS INTERMEDIATE, AND A SEQUENTIAL SAPHENOUS VEIN GRAFT TO THE POSTERIOR DESCENDING AND POSTERIOR LATERAL BRANCHES.  . Mastectomy 1975    RIGHT SIDE  . Cardiovascular stress test Aug 2010    Normal; EF 74%  . Cardiac catheterization 01/20/2001    NORMAL. EF 60-70%  . Tubal ligation     1960's  . Breast surgery     left breast cyst surgery   . Mass excision 04/08/2011    Procedure: EXCISION MASS;  Surgeon: Nicki Reaper, MD;  Location: [facility];  Service: Orthopedics;  Laterality: Left;  excision mass left palm    PT Assessment/Plan/Recommendation PT Assessment Clinical Impression Statement: 78 yo female admitted with syncope likely from dehydration from recent diarrhea. Pt had some difficuty from deconditioning and I think she would benefit from continued PT at discharge.  I talked with her about the possibility of short term SNF to regain independence to return home alone, but she does not want to do that.  She said she will be  able to get her cousins to stay with her when she goes home.  She said she does not home health PT either , but I would recommend that if she it to d/c to home.  Pt needs to use her RW full time at this point to avoid falls. PT Recommendation/Assessment: Patient will need skilled PT in the acute care venue PT Problem List: Decreased strength;Decreased activity tolerance;Decreased balance;Decreased mobility;Decreased safety awareness Barriers to Discharge: Decreased caregiver support PT Therapy Diagnosis : Difficulty walking;Abnormality of gait;Generalized weakness PT Plan PT Frequency: Min 3X/week PT Treatment/Interventions: Gait training;Functional mobility training;Therapeutic activities;Therapeutic exercise;Balance training;Patient/family education PT Recommendation Recommendations for Other Services: OT consult Follow Up Recommendations: Home health PT;Skilled nursing facility;Supervision/Assistance - 24 hour Equipment Recommended: None recommended by PT;Defer to next venue PT Goals  Acute Rehab PT Goals PT Goal Formulation: With patient Time For Goal Achievement: 2 weeks Pt will Roll Supine to Right Side: Independently PT Goal: Rolling Supine to Right Side - Progress: Goal set today Pt will go Supine/Side to Sit: Independently PT Goal: Supine/Side to Sit - Progress: Goal set today Pt will go Sit to Stand: Independently PT Goal: Sit to Stand - Progress: Goal set today Pt will Ambulate: >150 feet;with modified independence;with least restrictive assistive device PT Goal: Ambulate - Progress: Goal set today  PT Evaluation Precautions/Restrictions  Precautions Precautions: Fall Required Braces or Orthoses: No Restrictions Weight Bearing Restrictions: No Prior Functioning  Home Living Lives With: Alone Receives Help From: Friend(s);Family Type of Home: House Home Layout: One level Home Access: Stairs to enter [employer] of Steps: 3 Home Adaptive Equipment: Straight  cane;Walker - rolling Additional Comments: pt said she occasionally uses her cane.  she said she can get her cousins to come and stay with her Prior Function Level of Independence: Requires assistive device for independence Able to Take Stairs?: Yes Cognition Cognition Arousal/Alertness: Awake/alert Overall Cognitive Status: Appears within functional limits for tasks assessed Orientation Level: Oriented X4 Cognition - Other Comments: pt easily distracted from task Sensation/Coordination Coordination Gross Motor Movements are Fluid and Coordinated: Yes Extremity Assessment RLE Assessment RLE Assessment: Within Functional Limits LLE Assessment LLE Assessment: Within Functional Limits Mobility (including Balance) Bed Mobility Bed Mobility: Yes Rolling Right: 4: Min assist Rolling Right Details (indicate cue type and reason): pt needed min assist to roll.  she may not have understood the directions Rolling Left: 4: Min assist Rolling Left Details (indicate cue type and reason): pt states she can roll better on her bed at home because its wider Transfers Transfers: Yes Sit to Stand: 3: Mod assist Sit to Stand Details (indicate cue type and reason): needs cues to push up on armrests and to extend trunk in standing Stand to Sit: 4: Min assist Stand to Sit Details: cues to reach back for armrests Ambulation/Gait Ambulation/Gait: Yes Ambulation/Gait Assistance: 4: Min assist Ambulation/Gait Assistance Details (indicate cue type and reason):  pt needed assist to direct RW and to  step up into it.  She had difficulty because of pain in left ankle and says she is only having this trouble becayuse sh hasnt walked in a few days Ambulation Distance (Feet): 150 Feet Assistive device: Rolling walker Gait Pattern: Step-through pattern Gait velocity: decreased Stairs: No Wheelchair Mobility Wheelchair Mobility: No  Posture/Postural Control Posture/Postural Control: No significant  limitations Balance Balance Assessed: No Exercise    End of Session PT - End of Session Equipment Utilized During Treatment: Gait belt Activity Tolerance: Patient limited by fatigue Patient left: in bed;with bed alarm set;with family/visitor present Nurse Communication: Mobility status for transfers;Mobility status for ambulation General Behavior During Session: [facility] for tasks performed Cognition: [facility] for tasks performed  Donnetta Hail 08/17/2011, 4:34 PM

## 2011-08-18 DIAGNOSIS — F0280 Dementia in other diseases classified elsewhere without behavioral disturbance: Secondary | ICD-10-CM | POA: Diagnosis present

## 2011-08-18 DIAGNOSIS — Z01810 Encounter for preprocedural cardiovascular examination: Secondary | ICD-10-CM | POA: Diagnosis not present

## 2011-08-18 DIAGNOSIS — R109 Unspecified abdominal pain: Secondary | ICD-10-CM | POA: Diagnosis not present

## 2011-08-18 DIAGNOSIS — E782 Mixed hyperlipidemia: Secondary | ICD-10-CM | POA: Diagnosis not present

## 2011-08-18 DIAGNOSIS — E785 Hyperlipidemia, unspecified: Secondary | ICD-10-CM | POA: Diagnosis present

## 2011-08-18 DIAGNOSIS — R55 Syncope and collapse: Secondary | ICD-10-CM | POA: Diagnosis not present

## 2011-08-18 DIAGNOSIS — R079 Chest pain, unspecified: Secondary | ICD-10-CM | POA: Diagnosis not present

## 2011-08-18 DIAGNOSIS — Z8739 Personal history of other diseases of the musculoskeletal system and connective tissue: Secondary | ICD-10-CM | POA: Diagnosis not present

## 2011-08-18 DIAGNOSIS — E041 Nontoxic single thyroid nodule: Secondary | ICD-10-CM | POA: Diagnosis not present

## 2011-08-18 DIAGNOSIS — Z9010 Acquired absence of unspecified breast and nipple: Secondary | ICD-10-CM | POA: Diagnosis not present

## 2011-08-18 DIAGNOSIS — I252 Old myocardial infarction: Secondary | ICD-10-CM | POA: Diagnosis not present

## 2011-08-18 DIAGNOSIS — F05 Delirium due to known physiological condition: Secondary | ICD-10-CM | POA: Diagnosis not present

## 2011-08-18 DIAGNOSIS — I10 Essential (primary) hypertension: Secondary | ICD-10-CM | POA: Diagnosis not present

## 2011-08-18 DIAGNOSIS — R4182 Altered mental status, unspecified: Secondary | ICD-10-CM | POA: Diagnosis not present

## 2011-08-18 DIAGNOSIS — E860 Dehydration: Secondary | ICD-10-CM | POA: Diagnosis present

## 2011-08-18 DIAGNOSIS — E039 Hypothyroidism, unspecified: Secondary | ICD-10-CM | POA: Diagnosis not present

## 2011-08-18 DIAGNOSIS — I6789 Other cerebrovascular disease: Secondary | ICD-10-CM | POA: Diagnosis not present

## 2011-08-18 DIAGNOSIS — I2510 Atherosclerotic heart disease of native coronary artery without angina pectoris: Secondary | ICD-10-CM | POA: Diagnosis not present

## 2011-08-18 DIAGNOSIS — Z853 Personal history of malignant neoplasm of breast: Secondary | ICD-10-CM | POA: Diagnosis not present

## 2011-08-18 DIAGNOSIS — E8809 Other disorders of plasma-protein metabolism, not elsewhere classified: Secondary | ICD-10-CM | POA: Diagnosis present

## 2011-08-18 DIAGNOSIS — I672 Cerebral atherosclerosis: Secondary | ICD-10-CM | POA: Diagnosis present

## 2011-08-18 DIAGNOSIS — M6281 Muscle weakness (generalized): Secondary | ICD-10-CM | POA: Diagnosis not present

## 2011-08-18 DIAGNOSIS — C50919 Malignant neoplasm of unspecified site of unspecified female breast: Secondary | ICD-10-CM | POA: Diagnosis not present

## 2011-08-18 DIAGNOSIS — R404 Transient alteration of awareness: Secondary | ICD-10-CM | POA: Diagnosis not present

## 2011-08-18 DIAGNOSIS — M1990 Unspecified osteoarthritis, unspecified site: Secondary | ICD-10-CM | POA: Diagnosis present

## 2011-08-18 DIAGNOSIS — IMO0002 Reserved for concepts with insufficient information to code with codable children: Secondary | ICD-10-CM | POA: Diagnosis not present

## 2011-08-18 DIAGNOSIS — R071 Chest pain on breathing: Secondary | ICD-10-CM | POA: Diagnosis not present

## 2011-08-18 DIAGNOSIS — G40909 Epilepsy, unspecified, not intractable, without status epilepticus: Secondary | ICD-10-CM | POA: Diagnosis not present

## 2011-08-18 DIAGNOSIS — Z01812 Encounter for preprocedural laboratory examination: Secondary | ICD-10-CM | POA: Diagnosis not present

## 2011-08-18 DIAGNOSIS — E871 Hypo-osmolality and hyponatremia: Secondary | ICD-10-CM | POA: Diagnosis not present

## 2011-08-18 DIAGNOSIS — Z5189 Encounter for other specified aftercare: Secondary | ICD-10-CM | POA: Diagnosis not present

## 2011-08-18 DIAGNOSIS — R918 Other nonspecific abnormal finding of lung field: Secondary | ICD-10-CM | POA: Diagnosis not present

## 2011-08-18 DIAGNOSIS — R799 Abnormal finding of blood chemistry, unspecified: Secondary | ICD-10-CM | POA: Diagnosis not present

## 2011-08-18 DIAGNOSIS — E46 Unspecified protein-calorie malnutrition: Secondary | ICD-10-CM | POA: Diagnosis present

## 2011-08-18 DIAGNOSIS — I25810 Atherosclerosis of coronary artery bypass graft(s) without angina pectoris: Secondary | ICD-10-CM | POA: Diagnosis not present

## 2011-08-18 DIAGNOSIS — N179 Acute kidney failure, unspecified: Secondary | ICD-10-CM | POA: Diagnosis present

## 2011-08-18 DIAGNOSIS — Z951 Presence of aortocoronary bypass graft: Secondary | ICD-10-CM | POA: Diagnosis not present

## 2011-08-18 DIAGNOSIS — F29 Unspecified psychosis not due to a substance or known physiological condition: Secondary | ICD-10-CM | POA: Diagnosis not present

## 2011-08-18 NOTE — Progress Notes (Signed)
08/18/11 Patient had PIV removed. Patient was reminded that she was going to [facility]. Patient to be transported by [facility] when available to SNF.

## 2011-08-18 NOTE — Progress Notes (Signed)
PATIENT AGREEABLE TO SNF IN THE [place] AREA.CSW NOTIFIED.MD UPDATED.

## 2011-08-18 NOTE — Progress Notes (Addendum)
CSW met with patient. Patient is alert and oriented. Patient is refusing SNF. CSW discussed risks and benefits. Referred to case manager for assessment of home health needs.  Lyndon Chenoweth C. Harrold Fitchett MSW, LCSW [phone] Patient now agreeable to SNF. Spoke with patients nephew, mark, requesting [facility] [place]. Packet copied and placed in [place]. Ptar called for transportation. Patient and patients nephew notified.  Arlis Yale C. Shannia Jacuinde MSW, LCSW [phone]

## 2011-08-18 NOTE — Progress Notes (Signed)
Subjective: No complains. diarhea resolved  Objective: Filed Vitals:   08/17/11 1741 08/17/11 2233 08/18/11 0253 08/18/11 0536  BP: 137/76 138/68 138/73 110/61  Pulse: 55 59 60 67  Temp: 98.1 F (36.7 C) 98.7 F (37.1 C) 98.5 F (36.9 C) 98.6 F (37 C)  TempSrc: Oral Oral Oral Oral  Resp: 20 20 20 20   Height:      Weight:      SpO2: 100% 100% 97% 94%   Weight change:   Intake/Output Summary (Last 24 hours) at 08/18/11 0802 Last data filed at 08/18/11 0436  Gross per 24 hour  Intake    720 ml  Output   1200 ml  Net   -480 ml    General: Alert, awake, oriented 3, in no acute distress.  HEENT: No bruits, no goiter.  Heart: Regular rate and rhythm, without murmurs, rubs, gallops.  Lungs: Crackles left side, bilateral air movement.  Abdomen: Soft, nontender, nondistended, positive bowel sounds.  Neuro: Grossly intact, nonfocal.   Lab Results:  Basename 08/17/11 0540  NA 134*  K 3.6  CL 102  CO2 26  GLUCOSE 83  BUN 13  CREATININE 0.82  CALCIUM 9.0  MG --  PHOS --   No results found for this basename: AST:2,ALT:2,ALKPHOS:2,BILITOT:2,PROT:2,ALBUMIN:2 in the last 72 hours No results found for this basename: LIPASE:2,AMYLASE:2 in the last 72 hours No results found for this basename: WBC:2,NEUTROABS:2,HGB:2,HCT:2,MCV:2,PLT:2 in the last 72 hours No results found for this basename: CKTOTAL:3,CKMB:3,CKMBINDEX:3,TROPONINI:3 in the last 72 hours No components found with this basename: POCBNP:3 No results found for this basename: DDIMER:2 in the last 72 hours No results found for this basename: HGBA1C:2 in the last 72 hours No results found for this basename: CHOL:2,HDL:2,LDLCALC:2,TRIG:2,CHOLHDL:2,LDLDIRECT:2 in the last 72 hours No results found for this basename: TSH,T4TOTAL,FREET3,T3FREE,THYROIDAB in the last 72 hours No results found for this basename: VITAMINB12:2,FOLATE:2,FERRITIN:2,TIBC:2,IRON:2,RETICCTPCT:2 in the last 72 hours  Micro Results: Recent Results  (from the past 240 hour(s))  CLOSTRIDIUM DIFFICILE BY PCR     Status: Normal   Collection Time   08/16/11 11:38 AM      Component Value Range Status Comment   C difficile by pcr NEGATIVE  NEGATIVE  Final     Studies/Results: Mr Shirlee Latch Wo Contrast  08/16/2011  *RADIOLOGY REPORT*  Clinical Data:  Falls and weakness.  Rule out stroke.  MRI HEAD WITHOUT CONTRAST MRA HEAD WITHOUT CONTRAST MRA NECK WITHOUT AND WITH CONTRAST  Technique:  Multiplanar, multiecho pulse sequences of the brain and surrounding structures were obtained without intravenous contrast. Angiographic images of the Circle of Willis were obtained using MRA technique without intravenous contrast.  Angiographic images of the neck were obtained using MRA technique without and with intravenous contrast.  Carotid stenosis measurements (when applicable) are obtained utilizing NASCET criteria, using the distal internal carotid diameter as the denominator.  Contrast: 15mL MULTIHANCE GADOBENATE DIMEGLUMINE 529 MG/ML IV SOLN  Comparison:  CT head without contrast 08/14/2011.  MRI HEAD  Findings:  The diffusion weighted images demonstrate no evidence for acute or subacute infarction.  Periventricular and subcortical white matter changes are present bilaterally.  The ventricles are proportionate to the degree of atrophy.  No significant extra-axial fluid collection is present.  Flow is present in the major intracranial arteries.  The globes orbits are intact.  The paranasal sinuses mastoid air cells are clear.  IMPRESSION:  1.  No acute intracranial abnormality. 2.  Mild atrophy white matter disease.  This likely reflects the sequelae of  chronic microvascular ischemia.  MRA HEAD  Findings: The internal carotid arteries are within normal limits from the high cervical segments through the ICA termini.  The A1 and M1 segments are normal.  ACA and MCA branch vessels are within normal limits.  The left PICA origin is visualized and within normal limits.  The  right PICA origin is below the field of view.  The left vertebral artery is slightly dominant.  The basilar artery is within normal limits.  The posterior cerebral arteries both originate from the basilar tip.  The PCA branch vessels are within normal limits for age.  IMPRESSION: Normal variant MRA circle of Willis without evidence for significant proximal stenosis, aneurysm, or branch vessel occlusion.  MRA NECK  Findings: The time-of-flight images demonstrate no significant flow disturbance at either carotid bifurcation.  Flow is antegrade in the vertebral arteries bilaterally.  The postcontrast images demonstrate a standard three-vessel arch configuration.  Both posterior cerebral arteries originate from the subclavian arteries.  The left vertebral artery is the dominant vessel.  No focal stenosis is present.  The right common carotid artery is within normal limits.  The bifurcation is unremarkable.  There is no focal stenosis.  The right internal carotid artery is within normal limits.  The left common carotid artery is unremarkable.  There is minimal irregularity proximal left internal carotid artery without significant stenosis relative to the distal vessel.  IMPRESSION: No significant carotid or vertebral artery disease.  Original Report Authenticated By: Jamesetta Orleans. MATTERN, M.D.   Mr Angiogram Neck W Wo Contrast  08/16/2011  *RADIOLOGY REPORT*  Clinical Data:  Falls and weakness.  Rule out stroke.  MRI HEAD WITHOUT CONTRAST MRA HEAD WITHOUT CONTRAST MRA NECK WITHOUT AND WITH CONTRAST  Technique:  Multiplanar, multiecho pulse sequences of the brain and surrounding structures were obtained without intravenous contrast. Angiographic images of the Circle of Willis were obtained using MRA technique without intravenous contrast.  Angiographic images of the neck were obtained using MRA technique without and with intravenous contrast.  Carotid stenosis measurements (when applicable) are obtained utilizing  NASCET criteria, using the distal internal carotid diameter as the denominator.  Contrast: 15mL MULTIHANCE GADOBENATE DIMEGLUMINE 529 MG/ML IV SOLN  Comparison:  CT head without contrast 08/14/2011.  MRI HEAD  Findings:  The diffusion weighted images demonstrate no evidence for acute or subacute infarction.  Periventricular and subcortical white matter changes are present bilaterally.  The ventricles are proportionate to the degree of atrophy.  No significant extra-axial fluid collection is present.  Flow is present in the major intracranial arteries.  The globes orbits are intact.  The paranasal sinuses mastoid air cells are clear.  IMPRESSION:  1.  No acute intracranial abnormality. 2.  Mild atrophy white matter disease.  This likely reflects the sequelae of chronic microvascular ischemia.  MRA HEAD  Findings: The internal carotid arteries are within normal limits from the high cervical segments through the ICA termini.  The A1 and M1 segments are normal.  ACA and MCA branch vessels are within normal limits.  The left PICA origin is visualized and within normal limits.  The right PICA origin is below the field of view.  The left vertebral artery is slightly dominant.  The basilar artery is within normal limits.  The posterior cerebral arteries both originate from the basilar tip.  The PCA branch vessels are within normal limits for age.  IMPRESSION: Normal variant MRA circle of Willis without evidence for significant proximal stenosis, aneurysm, or branch vessel  occlusion.  MRA NECK  Findings: The time-of-flight images demonstrate no significant flow disturbance at either carotid bifurcation.  Flow is antegrade in the vertebral arteries bilaterally.  The postcontrast images demonstrate a standard three-vessel arch configuration.  Both posterior cerebral arteries originate from the subclavian arteries.  The left vertebral artery is the dominant vessel.  No focal stenosis is present.  The right common carotid artery  is within normal limits.  The bifurcation is unremarkable.  There is no focal stenosis.  The right internal carotid artery is within normal limits.  The left common carotid artery is unremarkable.  There is minimal irregularity proximal left internal carotid artery without significant stenosis relative to the distal vessel.  IMPRESSION: No significant carotid or vertebral artery disease.  Original Report Authenticated By: Jamesetta Orleans. MATTERN, M.D.   Mr Brain Wo Contrast  08/16/2011  *RADIOLOGY REPORT*  Clinical Data:  Falls and weakness.  Rule out stroke.  MRI HEAD WITHOUT CONTRAST MRA HEAD WITHOUT CONTRAST MRA NECK WITHOUT AND WITH CONTRAST  Technique:  Multiplanar, multiecho pulse sequences of the brain and surrounding structures were obtained without intravenous contrast. Angiographic images of the Circle of Willis were obtained using MRA technique without intravenous contrast.  Angiographic images of the neck were obtained using MRA technique without and with intravenous contrast.  Carotid stenosis measurements (when applicable) are obtained utilizing NASCET criteria, using the distal internal carotid diameter as the denominator.  Contrast: 15mL MULTIHANCE GADOBENATE DIMEGLUMINE 529 MG/ML IV SOLN  Comparison:  CT head without contrast 08/14/2011.  MRI HEAD  Findings:  The diffusion weighted images demonstrate no evidence for acute or subacute infarction.  Periventricular and subcortical white matter changes are present bilaterally.  The ventricles are proportionate to the degree of atrophy.  No significant extra-axial fluid collection is present.  Flow is present in the major intracranial arteries.  The globes orbits are intact.  The paranasal sinuses mastoid air cells are clear.  IMPRESSION:  1.  No acute intracranial abnormality. 2.  Mild atrophy white matter disease.  This likely reflects the sequelae of chronic microvascular ischemia.  MRA HEAD  Findings: The internal carotid arteries are within normal  limits from the high cervical segments through the ICA termini.  The A1 and M1 segments are normal.  ACA and MCA branch vessels are within normal limits.  The left PICA origin is visualized and within normal limits.  The right PICA origin is below the field of view.  The left vertebral artery is slightly dominant.  The basilar artery is within normal limits.  The posterior cerebral arteries both originate from the basilar tip.  The PCA branch vessels are within normal limits for age.  IMPRESSION: Normal variant MRA circle of Willis without evidence for significant proximal stenosis, aneurysm, or branch vessel occlusion.  MRA NECK  Findings: The time-of-flight images demonstrate no significant flow disturbance at either carotid bifurcation.  Flow is antegrade in the vertebral arteries bilaterally.  The postcontrast images demonstrate a standard three-vessel arch configuration.  Both posterior cerebral arteries originate from the subclavian arteries.  The left vertebral artery is the dominant vessel.  No focal stenosis is present.  The right common carotid artery is within normal limits.  The bifurcation is unremarkable.  There is no focal stenosis.  The right internal carotid artery is within normal limits.  The left common carotid artery is unremarkable.  There is minimal irregularity proximal left internal carotid artery without significant stenosis relative to the distal vessel.  IMPRESSION: No significant  carotid or vertebral artery disease.  Original Report Authenticated By: Jamesetta Orleans. MATTERN, M.D.    Medications: I have reviewed the patient's current medications.  Assessment and plan: Principal Problem:  *Syncope and collapse: She was admitted to the telemetry floor she is no events,  She is hyponatremic and hypochloremic, her creatinine is just slightly up from baseline. She is on hydrochlorothiazide at home. So the most likely cause of her syncope would be intravascular volume depletion along  with diarrhea  -check a bet in am. KVO IV fluids, MRI/MRA ho head and neck showed no stroke no ICA stenosis.   CAD (coronary artery disease): She is not complaining of any chest pain, continue aspirin.   Hyperlipidemia Continue home meds.   Hypothyroidism TSH is pending, continue Synthroid at current dose.   Hyponatremia Basic metabolic panel in am.  Diarrhea:  resolved c. Dif negative.  DISPO: awaiting placement. Contacts: -bernadine: [phone] -Nephew (mark):  cel: [id number] Home: [id number]  LOS: 4 days   Marinda Elk M.D. Pager: [phone] [facility] 08/18/2011, 8:02 AM

## 2011-08-18 NOTE — Clinical Documentation Improvement (Signed)
CHANGE MENTAL STATUS DOCUMENTATION CLARIFICATION   THIS DOCUMENT IS NOT A PERMANENT PART OF THE MEDICAL RECORD  TO RESPOND TO THE THIS QUERY, FOLLOW THE INSTRUCTIONS BELOW:  1. If needed, update documentation for the patient's encounter via the notes activity.  2. Access this query again and click edit on the In [employer].  3. After updating, or not, click F2 to complete all highlighted (required) fields concerning your review. Select "additional documentation in the medical record" OR "no additional documentation provided".  4. Click Sign note button.  5. The deficiency will fall out of your In Basket *Please let us know if you are not able to complete this workflow by phone or e-mail (listed below).         08/18/11  Dear Dr. [facility]  In an effort to better capture your patient's severity of illness, reflect appropriate length of stay and utilization of resources, a review of the patient medical record has revealed the following indicators.    Based on your clinical judgment, please clarify and document in a progress note and/or discharge summary the clinical condition associated with the following supporting information:  In responding to this query please exercise your independent judgment.  The fact that a query is asked, does not imply that any particular answer is desired or expected. 08/15/11 H&P.Marland KitchenMarland Kitchen"Patient episode of encephalopathy in the ER. May be related to patient's hyponatremia".Marland KitchenMarland KitchenFor accurate Dx specificity & severity please help verify if noted condition was "POA", treated, & resolved.  Thank you  Possible Clinical Conditions?  -Encephalopathy (describe type if known)                       Anoxic                       Septic                       Alcoholic                        Hepatic                       Hypertensive                       Metabolic                      Toxic  -Drug induced confusion/delirium -Acute confusion -Acute  delirium -Acute exacerbation of known dementia (indicate type) -New diagnosis of Dementia, Alzheimer's, cerebral atherosclerosis  -Hyponatremia / Hypernatremia -Poisoning / Overdose -Hypoxemia / Hypoxia  -Other Condition -Cannot Clinically Determine   Supporting Information: Risk Factors: per 08/14/11 H&P, noted to have 3 episodes of syncope prior to adm.  Signs & Symptoms: see above note  Diagnostics: Lab: see above note  Radiology: CT scan: 08/15/11: No acute findings MRI:08/16/11: No acute findings MRA: 08/16/11: No acute findings [place]: 08/16/11: No acute findings  EEG: none  Treatment: 08/15/11, iv fluids, daily monitor labs    Reviewed:  no additional documentation provided: 08/19/11 pt dc'd 08/18/11>dc summ rev'd.ORM  Thank You,  Toribio Harbour, RN, BSN, CCDS Certified Clinical Documentation Specialist Pager: [phone]  Health Information Management 

## 2011-08-18 NOTE — Progress Notes (Signed)
SPOKE TO PATIENT ABOUT HOME HEALTH CARE SERVICES,SHE WILL CONSIDER & LET ME KNOW,ALSO PROVIDED HER W/[facility] REFERRAL TEL #.SHE ALREADY HAS CANE, RW,PHARMACY,TRANSP.

## 2011-08-22 DIAGNOSIS — E041 Nontoxic single thyroid nodule: Secondary | ICD-10-CM | POA: Diagnosis not present

## 2011-08-22 DIAGNOSIS — E039 Hypothyroidism, unspecified: Secondary | ICD-10-CM | POA: Diagnosis not present

## 2011-08-22 DIAGNOSIS — I10 Essential (primary) hypertension: Secondary | ICD-10-CM | POA: Diagnosis not present

## 2011-08-22 DIAGNOSIS — I2510 Atherosclerotic heart disease of native coronary artery without angina pectoris: Secondary | ICD-10-CM | POA: Diagnosis not present

## 2011-08-22 DIAGNOSIS — M6281 Muscle weakness (generalized): Secondary | ICD-10-CM | POA: Diagnosis not present

## 2011-08-22 DIAGNOSIS — Z8739 Personal history of other diseases of the musculoskeletal system and connective tissue: Secondary | ICD-10-CM | POA: Diagnosis not present

## 2011-09-11 ENCOUNTER — Emergency Department (HOSPITAL_COMMUNITY): Payer: Medicare Other

## 2011-09-11 ENCOUNTER — Encounter (HOSPITAL_COMMUNITY): Payer: Self-pay | Admitting: *Deleted

## 2011-09-11 ENCOUNTER — Emergency Department (HOSPITAL_COMMUNITY)
Admission: EM | Admit: 2011-09-11 | Discharge: 2011-09-11 | Disposition: A | Discharge status: Home / Self Care | Payer: Medicare Other | Source: Home / Self Care | Attending: Emergency Medicine | Admitting: Emergency Medicine

## 2011-09-11 DIAGNOSIS — R799 Abnormal finding of blood chemistry, unspecified: Secondary | ICD-10-CM | POA: Diagnosis not present

## 2011-09-11 DIAGNOSIS — R918 Other nonspecific abnormal finding of lung field: Secondary | ICD-10-CM | POA: Diagnosis not present

## 2011-09-11 DIAGNOSIS — R404 Transient alteration of awareness: Secondary | ICD-10-CM | POA: Diagnosis not present

## 2011-09-11 DIAGNOSIS — R079 Chest pain, unspecified: Secondary | ICD-10-CM | POA: Diagnosis not present

## 2011-09-11 DIAGNOSIS — R071 Chest pain on breathing: Secondary | ICD-10-CM | POA: Diagnosis not present

## 2011-09-11 DIAGNOSIS — I252 Old myocardial infarction: Secondary | ICD-10-CM | POA: Insufficient documentation

## 2011-09-11 DIAGNOSIS — I2510 Atherosclerotic heart disease of native coronary artery without angina pectoris: Secondary | ICD-10-CM | POA: Insufficient documentation

## 2011-09-11 DIAGNOSIS — I10 Essential (primary) hypertension: Secondary | ICD-10-CM | POA: Diagnosis not present

## 2011-09-11 DIAGNOSIS — Z951 Presence of aortocoronary bypass graft: Secondary | ICD-10-CM | POA: Insufficient documentation

## 2011-09-11 DIAGNOSIS — E039 Hypothyroidism, unspecified: Secondary | ICD-10-CM | POA: Insufficient documentation

## 2011-09-11 DIAGNOSIS — I259 Chronic ischemic heart disease, unspecified: Secondary | ICD-10-CM | POA: Insufficient documentation

## 2011-09-11 DIAGNOSIS — R109 Unspecified abdominal pain: Secondary | ICD-10-CM | POA: Insufficient documentation

## 2011-09-11 LAB — D-DIMER, QUANTITATIVE: D-Dimer, Quant: 1.12 ug/mL-FEU — ABNORMAL HIGH (ref 0.00–0.48)

## 2011-09-11 LAB — CBC
HCT: 34.4 % — ABNORMAL LOW (ref 36.0–46.0)
Hemoglobin: 11.3 g/dL — ABNORMAL LOW (ref 12.0–15.0)
MCV: 84.1 fL (ref 78.0–100.0)
RBC: 4.09 MIL/uL (ref 3.87–5.11)
RDW: 14.2 % (ref 11.5–15.5)
WBC: 4.5 10*3/uL (ref 4.0–10.5)

## 2011-09-11 LAB — URINALYSIS, ROUTINE W REFLEX MICROSCOPIC
Bilirubin Urine: NEGATIVE
Ketones, ur: NEGATIVE mg/dL
Nitrite: NEGATIVE
Specific Gravity, Urine: 1.015 (ref 1.005–1.030)
Urobilinogen, UA: 0.2 mg/dL (ref 0.0–1.0)

## 2011-09-11 LAB — TROPONIN I: Troponin I: <0.3 ng/mL (ref <0.30)

## 2011-09-11 LAB — COMPREHENSIVE METABOLIC PANEL
BUN: 14 mg/dL (ref 6–23)
CO2: 27 mEq/L (ref 19–32)
Calcium: 10.2 mg/dL (ref 8.4–10.5)
Creatinine, Ser: 0.8 mg/dL (ref 0.50–1.10)
GFR calc Af Amer: 76 mL/min — ABNORMAL LOW (ref >90)
GFR calc non Af Amer: 66 mL/min — ABNORMAL LOW (ref >90)
Glucose, Bld: 91 mg/dL (ref 70–99)
Total Bilirubin: 0.4 mg/dL (ref 0.3–1.2)

## 2011-09-11 LAB — DIFFERENTIAL
Eosinophils Relative: 0 % (ref 0–5)
Lymphocytes Relative: 13 % (ref 12–46)
Lymphs Abs: 0.6 10*3/uL — ABNORMAL LOW (ref 0.7–4.0)
Monocytes Absolute: 0.1 10*3/uL (ref 0.1–1.0)
Monocytes Relative: 3 % (ref 3–12)

## 2011-09-11 MED ORDER — MORPHINE SULFATE 2 MG/ML IJ SOLN
2.0000 mg | Freq: Once | INTRAMUSCULAR | Status: DC
  Filled 2011-09-11: qty 1

## 2011-09-11 NOTE — ED Provider Notes (Signed)
History     CSN: [id number]  Arrival date & time 09/11/11  1129   First MD Initiated Contact with Patient 09/11/11 1134      Chief Complaint  Patient presents with  . Chest Pain    (Consider location/radiation/quality/duration/timing/severity/associated sxs/prior treatment) HPI Comments: Patient presents from nursing home with right-sided chest pain it radiates into her right flank that started this morning. She is a poor historian. She states the pain comes and goes and lasts a few seconds at a time. She's not had pain of this in the past. She denies any retrosternal chest pain, shortness of breath, nausea, vomiting, diarrhea. She had a bypass in 2002. She denies any dysuria, hematuria, fever or chills. She's had a previous surgical resection of a breast mass on the same right side.  The history is provided by the patient, the EMS personnel and the nursing home.    Past Medical History  Diagnosis Date  . IHD (ischemic heart disease)     with CABG in August 2002  . Hypertension   . Hypothyroidism   . Vitamin d deficiency   . Hyperlipidemia   . Anemia   . Hx of CABG     pt states she had 5 bypasses  . Breast cancer 1975    Right sided mastectomy  . Coronary artery disease   . Myocardial infarction     Non Q-wave MI  . Arthritis     osteoarthritis  . H/O hypokalemia     Past Surgical History  Procedure Date  . Coronary artery bypass graft 12/2000    LEFT INTERNAL MAMMARY TO THE LAD, SAPHENOUS VEIN GRAFT TO THE FIRST DIAGONAL, SAPHENOUS VEIN GRAFT TO THE RAMUS INTERMEDIATE, AND A SEQUENTIAL SAPHENOUS VEIN GRAFT TO THE POSTERIOR DESCENDING AND POSTERIOR LATERAL BRANCHES.  . Mastectomy 1975    RIGHT SIDE  . Cardiovascular stress test Aug 2010    Normal; EF 74%  . Cardiac catheterization 01/20/2001    NORMAL. EF 60-70%  . Tubal ligation     1960's  . Breast surgery     left breast cyst surgery   . Mass excision 04/08/2011    Procedure: EXCISION MASS;  Surgeon: Nicki Reaper, MD;  Location: [facility];  Service: Orthopedics;  Laterality: Left;  excision mass left palm    Family History  Problem Relation Age of Onset  . Alzheimer's disease Mother     History  Substance Use Topics  . Smoking status: Former Smoker -- 1.0 packs/day for 4 years    Types: Cigarettes    Quit date: 06/02/1958  . Smokeless tobacco: Never Used  . Alcohol Use: No    OB History    Grav Para Term Preterm Abortions TAB SAB Ect Mult Living                  Review of Systems  Constitutional: Negative for fever, activity change and appetite change.  HENT: Negative for congestion and rhinorrhea.   Eyes: Negative for visual disturbance.  Respiratory: Negative for shortness of breath.   Cardiovascular: Positive for chest pain.  Gastrointestinal: Negative for nausea, vomiting and abdominal pain.  Genitourinary: Positive for flank pain. Negative for dysuria, vaginal bleeding and vaginal discharge.  Musculoskeletal: Negative for back pain.  Skin: Negative for rash.  Neurological: Negative for dizziness and headaches.    Allergies  Valium; Codeine; Crestor; Doxycycline; Lipitor; Sulfa drugs cross reactors; Tequin; Tylenol; Vytorin; and Zocor  Home Medications   Current Outpatient  Rx  Name Route Sig Dispense Refill  . ACETAMINOPHEN 500 MG PO TABS Oral Take 500 mg by mouth every 6 (six) hours as needed. For pain    . ACETAMINOPHEN ER 650 MG PO TBCR Oral Take 1,300 mg by mouth daily as needed. For arthritis pain    . ASPIRIN 81 MG PO TABS Oral Take 162 mg by mouth daily.     Marland Kitchen HYDROCODONE-ACETAMINOPHEN 5-325 MG PO TABS Oral Take 1 tablet by mouth every 6 (six) hours as needed. For pain    . LEVOTHYROXINE SODIUM 50 MCG PO TABS Oral Take 50 mcg by mouth daily before breakfast.     . SYSTANE OP Both Eyes Place 2 drops into both eyes as needed. For eye dryness.    Marland Kitchen PRAVASTATIN SODIUM 40 MG PO TABS Oral Take 40 mg by mouth every evening.     Marland Kitchen RAMIPRIL 10 MG PO  TABS Oral Take 10 mg by mouth daily.    Marland Kitchen SIMETHICONE 80 MG PO CHEW Oral Chew 80 mg by mouth daily as needed. For bloating      BP 158/70  Pulse 57  Temp(Src) 98.8 F (37.1 C) (Oral)  Resp 20  SpO2 100%  Physical Exam  Constitutional: She is oriented to person, place, and time. She appears well-developed and well-nourished. No distress.  HENT:  Head: Normocephalic and atraumatic.  Mouth/Throat: Oropharynx is clear and moist. No oropharyngeal exudate.  Eyes: Conjunctivae are normal. Pupils are equal, round, and reactive to light.  Neck: Normal range of motion. Neck supple.  Cardiovascular: Normal rate, regular rhythm and normal heart sounds.   Pulmonary/Chest: Effort normal and breath sounds normal. No respiratory distress. She exhibits tenderness.       Well-healed scars on right chest wall and substernal. Tender to palpation over right chest wall. No rash or ecchymosis  Abdominal: Soft. There is no tenderness. There is no rebound and no guarding.       No right upper quadrant pain  Musculoskeletal: She exhibits no edema and no tenderness.       No CVA tenderness  Neurological: She is alert and oriented to person, place, and time. No cranial nerve deficit.  Skin: Skin is warm.    ED Course  Procedures (including critical care time)  Labs Reviewed  CBC - Abnormal; Notable for the following:    Hemoglobin 11.3 (*)    HCT 34.4 (*)    All other components within normal limits  DIFFERENTIAL - Abnormal; Notable for the following:    Neutrophils Relative 84 (*)    Lymphs Abs 0.6 (*)    All other components within normal limits  COMPREHENSIVE METABOLIC PANEL - Abnormal; Notable for the following:    Sodium 134 (*)    GFR calc non Af Amer 66 (*)    GFR calc Af Amer 76 (*)    All other components within normal limits  D-DIMER, QUANTITATIVE - Abnormal; Notable for the following:    D-Dimer, Quant 1.12 (*)    All other components within normal limits  URINALYSIS, ROUTINE W REFLEX  MICROSCOPIC - Abnormal; Notable for the following:    Protein, ur 30 (*)    Leukocytes, UA TRACE (*)    All other components within normal limits  URINE MICROSCOPIC-ADD ON - Abnormal; Notable for the following:    Casts HYALINE CASTS (*)    All other components within normal limits  LIPASE, BLOOD  TROPONIN I  URINALYSIS, ROUTINE W REFLEX MICROSCOPIC   Dg  Chest 2 View  09/11/2011  *RADIOLOGY REPORT*  Clinical Data: Chest pain  CHEST - 2 VIEW  Comparison: 08/14/2011  Findings: Cardiomediastinal silhouette is stable.  Again noted status post CABG.  The patient is status post right mastectomy.  No acute infiltrate or pleural effusion.  No pulmonary edema.  Stable degenerative changes thoracic spine.  IMPRESSION: No active disease.  No significant change.  Original Report Authenticated By: Natasha Mead, M.D.   Ct Angio Chest W/cm &/or Wo Cm  09/11/2011  *RADIOLOGY REPORT*  Clinical Data: Chest pain, elevated D-dimer, evaluate for PE. History of breast cancer.  CT ANGIOGRAPHY CHEST  Technique:  Multidetector CT imaging of the chest using the standard protocol during bolus administration of intravenous contrast. Multiplanar reconstructed images including MIPs were obtained and reviewed to evaluate the vascular anatomy.  Contrast:  75 ml Omnipaque-350 IV.  Comparison: Chest radiographs dated 09/11/2011.  Findings: No evidence of pulmonary embolism.  Minimal scarring versus atelectasis in the lingula and at the left lung base. Mild nodularity along the right major fissure measuring up to 3 mm (series 6/image 54).  2 mm nodule at the left lung apex (series 6/image 8).  No pleural effusion or pneumothorax.  Dominant right thyroid nodule, better characterized on prior ultrasound.  The heart is top normal in size.  No pericardial effusion. Coronary atherosclerosis.  Postsurgical changes related to prior CABG. Atherosclerotic calcifications of the aortic arch.  No suspicious mediastinal, hilar, or axillary  lymphadenopathy.  Status post right mastectomy.  Visualized upper abdomen is notable for bilateral renal cysts, better characterized on recent CT abdomen pelvis.  Degenerative changes of the visualized thoracolumbar spine. Inferior endplate degenerative changes at T7.  IMPRESSION: No evidence of pulmonary embolism.  Status post right mastectomy.  No evidence of metastatic disease in the chest.  Tiny 2-3 mm nodules in the left lung apex and along the right major fissure.  This is presumed to be unrelated to the prior history of breast cancer.  If the patient is at high risk for bronchogenic carcinoma, a single follow-up CT chest is suggested in 12 months. Otherwise, no dedicated follow-up imaging is required.  This recommendation follows the consensus statement: Guidelines for Management of Small Pulmonary Nodules Detected on CT Scans:  A Statement from the Fleischner Society as published in Radiology 2005; 237:395-400.  Original Report Authenticated By: Charline Bills, M.D.     1. Chest pain with low risk of acute coronary syndrome       MDM  Right-sided chest pain radiating to right flank. No associated symptoms. Chest and last a few seconds at a time it is reproducible to palpation. Very low suspicion for ACS.  Chest x-ray negative. EKG nonischemic. Urinalysis negative.  With elevated d-dimer and chest wall pain we'll obtain CT angiogram. D/w NP Katrinka Blazing to await results of CT in CDU.   Date: 09/11/2011  Rate: 61  Rhythm: normal sinus rhythm  QRS Axis: left  Intervals: normal  ST/T Wave abnormalities: nonspecific ST/T changes  Conduction Disutrbances:none  Narrative Interpretation:   Old EKG Reviewed: unchanged         Glynn Octave, MD 09/11/11 1911

## 2011-09-11 NOTE — ED Notes (Signed)
IV team at the bedside, attempting to gain access.

## 2011-09-11 NOTE — ED Notes (Signed)
Pt stated that she did not want any pain medication at this time. Denies any pain in her chest.

## 2011-09-11 NOTE — ED Provider Notes (Signed)
CTA results reviewed--no evidence of pulmonary embolism.  No ischemia on ECG.  Chest pain of unclear etiology.  Patient to follow-up with her PCP.  Jimmye Norman, NP 09/11/11 1936

## 2011-09-11 NOTE — Discharge Instructions (Signed)
Chest Pain (Nonspecific) It is often hard to give a specific diagnosis for the cause of chest pain. There is always a chance that your pain could be related to something serious, such as a heart attack or a blood clot in the lungs. You need to follow up with your caregiver for further evaluation. CAUSES   Heartburn.   Pneumonia or bronchitis.   Anxiety or stress.   Inflammation around your heart (pericarditis) or lung (pleuritis or pleurisy).   A blood clot in the lung.   A collapsed lung (pneumothorax). It can develop suddenly on its own (spontaneous pneumothorax) or from injury (trauma) to the chest.   Shingles infection (herpes zoster virus).  The chest wall is composed of bones, muscles, and cartilage. Any of these can be the source of the pain.  The bones can be bruised by injury.   The muscles or cartilage can be strained by coughing or overwork.   The cartilage can be affected by inflammation and become sore (costochondritis).  DIAGNOSIS  Lab tests or other studies, such as X-rays, electrocardiography, stress testing, or cardiac imaging, may be needed to find the cause of your pain.  TREATMENT   Treatment depends on what may be causing your chest pain. Treatment may include:   Acid blockers for heartburn.   Anti-inflammatory medicine.   Pain medicine for inflammatory conditions.   Antibiotics if an infection is present.   You may be advised to change lifestyle habits. This includes stopping smoking and avoiding alcohol, caffeine, and chocolate.   You may be advised to keep your head raised (elevated) when sleeping. This reduces the chance of acid going backward from your stomach into your esophagus.   Most of the time, nonspecific chest pain will improve within 2 to 3 days with rest and mild pain medicine.  HOME CARE INSTRUCTIONS   If antibiotics were prescribed, take your antibiotics as directed. Finish them even if you start to feel better.   For the next few  days, avoid physical activities that bring on chest pain. Continue physical activities as directed.   Do not smoke.   Avoid drinking alcohol.   Only take over-the-counter or prescription medicine for pain, discomfort, or fever as directed by your caregiver.   Follow your caregiver's suggestions for further testing if your chest pain does not go away.   Keep any follow-up appointments you made. If you do not go to an appointment, you could develop lasting (chronic) problems with pain. If there is any problem keeping an appointment, you must call to reschedule.  SEEK MEDICAL CARE IF:   You think you are having problems from the medicine you are taking. Read your medicine instructions carefully.   Your chest pain does not go away, even after treatment.   You develop a rash with blisters on your chest.  SEEK IMMEDIATE MEDICAL CARE IF:   You have increased chest pain or pain that spreads to your arm, neck, jaw, back, or abdomen.   You develop shortness of breath, an increasing cough, or you are coughing up blood.   You have severe back or abdominal pain, feel nauseous, or vomit.   You develop severe weakness, fainting, or chills.   You have a fever.  THIS IS AN EMERGENCY. Do not wait to see if the pain will go away. Get medical help at once. Call your local emergency services (911 in [place] Do not drive yourself to the hospital. MAKE SURE YOU:   Understand these instructions.     Will watch your condition.   Will get help right away if you are not doing well or get worse.  Document Released: 02/26/2005 Document Revised: 05/08/2011 Document Reviewed: 12/23/2007 [employer] Patient Information 2012 [employer]. 

## 2011-09-11 NOTE — ED Notes (Signed)
Pt is in ct. Will come to cdu after

## 2011-09-11 NOTE — ED Notes (Signed)
IV team paged for alternate IV access for CT scan

## 2011-09-11 NOTE — ED Notes (Signed)
Pt in via [facility] from [facility], pt c/o R sided CP radiating to R flank onset today @ 08:00, pt denies SOB, N/V/D, hx of HTN, per report from [facility] the pt is usually aletrt, but has been confused recently, pt is A&O x4 at this time

## 2011-09-12 ENCOUNTER — Encounter (HOSPITAL_COMMUNITY): Payer: Self-pay | Admitting: Family Medicine

## 2011-09-12 ENCOUNTER — Inpatient Hospital Stay (HOSPITAL_COMMUNITY): Payer: Medicare Other

## 2011-09-12 ENCOUNTER — Inpatient Hospital Stay (HOSPITAL_COMMUNITY)
Admission: EM | Admit: 2011-09-12 | Discharge: 2011-09-16 | DRG: 057 | Disposition: A | Discharge status: Skilled Nursing Facility | Payer: Medicare Other | Attending: Internal Medicine | Admitting: Internal Medicine

## 2011-09-12 DIAGNOSIS — E46 Unspecified protein-calorie malnutrition: Secondary | ICD-10-CM | POA: Diagnosis not present

## 2011-09-12 DIAGNOSIS — Z9010 Acquired absence of unspecified breast and nipple: Secondary | ICD-10-CM

## 2011-09-12 DIAGNOSIS — G309 Alzheimer's disease, unspecified: Secondary | ICD-10-CM | POA: Diagnosis present

## 2011-09-12 DIAGNOSIS — E860 Dehydration: Secondary | ICD-10-CM | POA: Diagnosis present

## 2011-09-12 DIAGNOSIS — N170 Acute kidney failure with tubular necrosis: Secondary | ICD-10-CM | POA: Diagnosis not present

## 2011-09-12 DIAGNOSIS — IMO0002 Reserved for concepts with insufficient information to code with codable children: Secondary | ICD-10-CM

## 2011-09-12 DIAGNOSIS — I119 Hypertensive heart disease without heart failure: Secondary | ICD-10-CM | POA: Diagnosis not present

## 2011-09-12 DIAGNOSIS — R799 Abnormal finding of blood chemistry, unspecified: Secondary | ICD-10-CM | POA: Diagnosis not present

## 2011-09-12 DIAGNOSIS — E785 Hyperlipidemia, unspecified: Secondary | ICD-10-CM | POA: Diagnosis not present

## 2011-09-12 DIAGNOSIS — I252 Old myocardial infarction: Secondary | ICD-10-CM | POA: Diagnosis not present

## 2011-09-12 DIAGNOSIS — R071 Chest pain on breathing: Secondary | ICD-10-CM | POA: Diagnosis not present

## 2011-09-12 DIAGNOSIS — E8809 Other disorders of plasma-protein metabolism, not elsewhere classified: Secondary | ICD-10-CM | POA: Diagnosis present

## 2011-09-12 DIAGNOSIS — I6789 Other cerebrovascular disease: Secondary | ICD-10-CM | POA: Diagnosis not present

## 2011-09-12 DIAGNOSIS — I2510 Atherosclerotic heart disease of native coronary artery without angina pectoris: Secondary | ICD-10-CM | POA: Diagnosis present

## 2011-09-12 DIAGNOSIS — F05 Delirium due to known physiological condition: Secondary | ICD-10-CM | POA: Diagnosis not present

## 2011-09-12 DIAGNOSIS — R079 Chest pain, unspecified: Secondary | ICD-10-CM | POA: Diagnosis not present

## 2011-09-12 DIAGNOSIS — E871 Hypo-osmolality and hyponatremia: Secondary | ICD-10-CM | POA: Diagnosis not present

## 2011-09-12 DIAGNOSIS — I672 Cerebral atherosclerosis: Secondary | ICD-10-CM | POA: Diagnosis present

## 2011-09-12 DIAGNOSIS — E782 Mixed hyperlipidemia: Secondary | ICD-10-CM | POA: Diagnosis not present

## 2011-09-12 DIAGNOSIS — N179 Acute kidney failure, unspecified: Secondary | ICD-10-CM | POA: Diagnosis not present

## 2011-09-12 DIAGNOSIS — F0390 Unspecified dementia without behavioral disturbance: Secondary | ICD-10-CM | POA: Diagnosis not present

## 2011-09-12 DIAGNOSIS — Z853 Personal history of malignant neoplasm of breast: Secondary | ICD-10-CM

## 2011-09-12 DIAGNOSIS — R918 Other nonspecific abnormal finding of lung field: Secondary | ICD-10-CM | POA: Diagnosis not present

## 2011-09-12 DIAGNOSIS — Z01812 Encounter for preprocedural laboratory examination: Secondary | ICD-10-CM | POA: Diagnosis not present

## 2011-09-12 DIAGNOSIS — Z01810 Encounter for preprocedural cardiovascular examination: Secondary | ICD-10-CM

## 2011-09-12 DIAGNOSIS — R404 Transient alteration of awareness: Secondary | ICD-10-CM | POA: Diagnosis not present

## 2011-09-12 DIAGNOSIS — E039 Hypothyroidism, unspecified: Secondary | ICD-10-CM | POA: Diagnosis not present

## 2011-09-12 DIAGNOSIS — R4182 Altered mental status, unspecified: Secondary | ICD-10-CM | POA: Diagnosis not present

## 2011-09-12 DIAGNOSIS — F0280 Dementia in other diseases classified elsewhere without behavioral disturbance: Principal | ICD-10-CM | POA: Diagnosis present

## 2011-09-12 DIAGNOSIS — R55 Syncope and collapse: Secondary | ICD-10-CM | POA: Diagnosis not present

## 2011-09-12 DIAGNOSIS — G40909 Epilepsy, unspecified, not intractable, without status epilepticus: Secondary | ICD-10-CM | POA: Diagnosis not present

## 2011-09-12 DIAGNOSIS — Z5189 Encounter for other specified aftercare: Secondary | ICD-10-CM | POA: Diagnosis not present

## 2011-09-12 DIAGNOSIS — F0150 Vascular dementia without behavioral disturbance: Secondary | ICD-10-CM | POA: Diagnosis present

## 2011-09-12 DIAGNOSIS — R410 Disorientation, unspecified: Secondary | ICD-10-CM | POA: Diagnosis present

## 2011-09-12 DIAGNOSIS — D490 Neoplasm of unspecified behavior of digestive system: Secondary | ICD-10-CM | POA: Diagnosis not present

## 2011-09-12 DIAGNOSIS — M1990 Unspecified osteoarthritis, unspecified site: Secondary | ICD-10-CM | POA: Diagnosis present

## 2011-09-12 DIAGNOSIS — I10 Essential (primary) hypertension: Secondary | ICD-10-CM | POA: Diagnosis not present

## 2011-09-12 DIAGNOSIS — Z951 Presence of aortocoronary bypass graft: Secondary | ICD-10-CM

## 2011-09-12 LAB — BASIC METABOLIC PANEL
BUN: 21 mg/dL (ref 6–23)
CO2: 26 mEq/L (ref 19–32)
Calcium: 10.6 mg/dL — ABNORMAL HIGH (ref 8.4–10.5)
Chloride: 95 mEq/L — ABNORMAL LOW (ref 96–112)
Creatinine, Ser: 1.23 mg/dL — ABNORMAL HIGH (ref 0.50–1.10)
GFR calc Af Amer: 45 mL/min — ABNORMAL LOW (ref >90)
GFR calc non Af Amer: 39 mL/min — ABNORMAL LOW (ref >90)
Glucose, Bld: 104 mg/dL — ABNORMAL HIGH (ref 70–99)
Potassium: 3.6 mEq/L (ref 3.5–5.1)
Sodium: 135 mEq/L (ref 135–145)

## 2011-09-12 LAB — URINALYSIS, ROUTINE W REFLEX MICROSCOPIC
Bilirubin Urine: NEGATIVE
Glucose, UA: NEGATIVE mg/dL
Hgb urine dipstick: NEGATIVE
Leukocytes, UA: NEGATIVE
Nitrite: NEGATIVE
Protein, ur: 100 mg/dL — AB
Specific Gravity, Urine: 1.023 (ref 1.005–1.030)
Urobilinogen, UA: 0.2 mg/dL (ref 0.0–1.0)
pH: 7 (ref 5.0–8.0)

## 2011-09-12 LAB — TROPONIN I: Troponin I: <0.3 ng/mL (ref <0.30)

## 2011-09-12 LAB — CREATININE, URINE, RANDOM: Creatinine, Urine: 154.5 mg/dL

## 2011-09-12 LAB — URINE MICROSCOPIC-ADD ON

## 2011-09-12 LAB — CBC
HCT: 35.8 % — ABNORMAL LOW (ref 36.0–46.0)
Hemoglobin: 11.8 g/dL — ABNORMAL LOW (ref 12.0–15.0)
MCH: 27.6 pg (ref 26.0–34.0)
MCHC: 33 g/dL (ref 30.0–36.0)
MCV: 83.6 fL (ref 78.0–100.0)
Platelets: 272 10*3/uL (ref 150–400)
RBC: 4.28 MIL/uL (ref 3.87–5.11)
RDW: 14.1 % (ref 11.5–15.5)
WBC: 6.2 10*3/uL (ref 4.0–10.5)

## 2011-09-12 LAB — SODIUM, URINE, RANDOM: Sodium, Ur: 111 mEq/L

## 2011-09-12 MED ORDER — SODIUM CHLORIDE 0.9 % IJ SOLN
3.0000 mL | Freq: Two times a day (BID) | INTRAMUSCULAR | Status: DC
  Administered 2011-09-16: 3 mL via INTRAVENOUS

## 2011-09-12 MED ORDER — ONDANSETRON HCL 4 MG/2ML IJ SOLN: 4.0000 mg | Freq: Four times a day (QID) | INTRAMUSCULAR | Status: DC | PRN

## 2011-09-12 MED ORDER — ACETAMINOPHEN 650 MG RE SUPP: 650.0000 mg | Freq: Four times a day (QID) | RECTAL | Status: DC | PRN

## 2011-09-12 MED ORDER — ENOXAPARIN SODIUM 40 MG/0.4ML ~~LOC~~ SOLN: 40.0000 mg | SUBCUTANEOUS | Status: DC

## 2011-09-12 MED ORDER — RAMIPRIL 10 MG PO TABS: 10.0000 mg | ORAL_TABLET | Freq: Every day | ORAL | Status: DC

## 2011-09-12 MED ORDER — ONDANSETRON HCL 4 MG/2ML IJ SOLN
4.0000 mg | Freq: Three times a day (TID) | INTRAMUSCULAR | Status: AC | PRN
Start: 2011-09-12 — End: 2011-09-13

## 2011-09-12 MED ORDER — SODIUM CHLORIDE 0.9 % IJ SOLN: 3.0000 mL | INTRAMUSCULAR | Status: DC | PRN

## 2011-09-12 MED ORDER — ZOLPIDEM TARTRATE 5 MG PO TABS: 5.0000 mg | ORAL_TABLET | Freq: Every evening | ORAL | Status: DC | PRN

## 2011-09-12 MED ORDER — RAMIPRIL 10 MG PO CAPS
10.0000 mg | ORAL_CAPSULE | Freq: Every day | ORAL | Status: DC
  Administered 2011-09-13: 10 mg via ORAL
  Filled 2011-09-12: qty 1

## 2011-09-12 MED ORDER — SODIUM CHLORIDE 0.9 % IV SOLN: 250.0000 mL | INTRAVENOUS | Status: DC | PRN

## 2011-09-12 MED ORDER — POLYETHYL GLYCOL-PROPYL GLYCOL 0.4-0.3 % OP SOLN: 2.0000 [drp] | Freq: Four times a day (QID) | OPHTHALMIC | Status: DC | PRN

## 2011-09-12 MED ORDER — ASPIRIN 81 MG PO CHEW
162.0000 mg | CHEWABLE_TABLET | Freq: Every day | ORAL | Status: DC
  Administered 2011-09-13 – 2011-09-16 (×4): 162 mg via ORAL
  Filled 2011-09-12 (×5): qty 2

## 2011-09-12 MED ORDER — SODIUM CHLORIDE 0.9 % IV BOLUS (SEPSIS)
1000.0000 mL | Freq: Once | INTRAVENOUS | Status: AC
  Administered 2011-09-12: 1000 mL via INTRAVENOUS

## 2011-09-12 MED ORDER — ACETAMINOPHEN 325 MG PO TABS
650.0000 mg | ORAL_TABLET | Freq: Four times a day (QID) | ORAL | Status: DC | PRN
  Administered 2011-09-12: 650 mg via ORAL
  Filled 2011-09-12: qty 2

## 2011-09-12 MED ORDER — ONDANSETRON HCL 4 MG PO TABS: 4.0000 mg | ORAL_TABLET | Freq: Four times a day (QID) | ORAL | Status: DC | PRN

## 2011-09-12 MED ORDER — LEVOTHYROXINE SODIUM 50 MCG PO TABS
50.0000 ug | ORAL_TABLET | Freq: Every day | ORAL | Status: DC
  Administered 2011-09-13 – 2011-09-16 (×4): 50 ug via ORAL
  Filled 2011-09-12 (×5): qty 1

## 2011-09-12 MED ORDER — ASPIRIN 81 MG PO TABS: 162.0000 mg | ORAL_TABLET | Freq: Every day | ORAL | Status: DC

## 2011-09-12 MED ORDER — OXYCODONE HCL 5 MG PO TABS
5.0000 mg | ORAL_TABLET | ORAL | Status: DC | PRN
  Administered 2011-09-12: 5 mg via ORAL
  Filled 2011-09-12: qty 1

## 2011-09-12 MED ORDER — SODIUM CHLORIDE 0.9 % IV SOLN
INTRAVENOUS | Status: DC
  Administered 2011-09-12 – 2011-09-15 (×5): via INTRAVENOUS

## 2011-09-12 MED ORDER — HALOPERIDOL LACTATE 5 MG/ML IJ SOLN
3.0000 mg | Freq: Once | INTRAMUSCULAR | Status: AC
  Administered 2011-09-12: 3 mg via INTRAVENOUS
  Filled 2011-09-12: qty 1

## 2011-09-12 MED ORDER — ALUM & MAG HYDROXIDE-SIMETH 200-200-20 MG/5ML PO SUSP: 30.0000 mL | Freq: Four times a day (QID) | ORAL | Status: DC | PRN

## 2011-09-12 MED ORDER — ENOXAPARIN SODIUM 30 MG/0.3ML ~~LOC~~ SOLN
30.0000 mg | SUBCUTANEOUS | Status: DC
  Administered 2011-09-12 – 2011-09-13 (×2): 30 mg via SUBCUTANEOUS
  Filled 2011-09-12 (×3): qty 0.3

## 2011-09-12 MED ORDER — POLYVINYL ALCOHOL 1.4 % OP SOLN
2.0000 [drp] | Freq: Four times a day (QID) | OPHTHALMIC | Status: DC | PRN
  Filled 2011-09-12: qty 15

## 2011-09-12 MED ORDER — HYDROMORPHONE HCL PF 1 MG/ML IJ SOLN: 0.5000 mg | INTRAMUSCULAR | Status: DC | PRN

## 2011-09-12 NOTE — ED Notes (Signed)
Dr. Juleen China at bedside speaking with family.

## 2011-09-12 NOTE — ED Notes (Signed)
[phone] : Natalie Bullock -pt's nephew, HCPOA

## 2011-09-12 NOTE — Progress Notes (Signed)
Pt off unit to CT

## 2011-09-12 NOTE — ED Notes (Addendum)
Per EMS: Pt from [facility]. Reports altered mental status at approx 13:50. States pt was seen at [facility] yesterday for same. States pt's norm is A/O now very confused. States only medication change was they stopped her vicodin.

## 2011-09-12 NOTE — ED Notes (Signed)
Caregiver at SNF called and stated that pt is normally A/O able to perform all ADLs

## 2011-09-12 NOTE — ED Notes (Signed)
AVW:[id number]<XB> Expected date:09/12/11<BR> Expected time:<BR> Means of arrival:<BR> Comments:<BR> EMS 41 gC - aloc

## 2011-09-12 NOTE — Progress Notes (Signed)
Pt on unit in room

## 2011-09-12 NOTE — H&P (Addendum)
DATE OF ADMISSION:  09/12/2011  PCP:   Burtis Junes, MD, MD   Chief Complaint: Confusion   HPI: Natalie Bullock is an 78 y.o. female sent from the [facility] due to worsening confusion and agitation.  She has not had a report of fevers chills nausea or vomiting or complaints of chest pain.  Her ED workup was unremarkable.  She was refered for further workup.    Past Medical History  Diagnosis Date  . IHD (ischemic heart disease)     with CABG in August 2002  . Hypertension   . Hypothyroidism   . Vitamin d deficiency   . Hyperlipidemia   . Anemia   . Hx of CABG     pt states she had 5 bypasses  . Breast cancer 1975    Right sided mastectomy  . Coronary artery disease   . Myocardial infarction     Non Q-wave MI  . Arthritis     osteoarthritis  . H/O hypokalemia     Past Surgical History  Procedure Date  . Coronary artery bypass graft 12/2000    LEFT INTERNAL MAMMARY TO THE LAD, SAPHENOUS VEIN GRAFT TO THE FIRST DIAGONAL, SAPHENOUS VEIN GRAFT TO THE RAMUS INTERMEDIATE, AND A SEQUENTIAL SAPHENOUS VEIN GRAFT TO THE POSTERIOR DESCENDING AND POSTERIOR LATERAL BRANCHES.  . Mastectomy 1975    RIGHT SIDE  . Cardiovascular stress test Aug 2010    Normal; EF 74%  . Cardiac catheterization 01/20/2001    NORMAL. EF 60-70%  . Tubal ligation     1960's  . Breast surgery     left breast cyst surgery   . Mass excision 04/08/2011    Procedure: EXCISION MASS;  Surgeon: Nicki Reaper, MD;  Location: [facility];  Service: Orthopedics;  Laterality: Left;  excision mass left palm    Medications:  HOME MEDS: Prior to Admission medications   Medication Sig Start Date End Date Taking? Authorizing Provider  acetaminophen (TYLENOL) 500 MG tablet Take 500 mg by mouth every 6 (six) hours as needed. For pain    Historical Provider, MD  acetaminophen (TYLENOL) 650 MG CR tablet Take 1,300 mg by mouth daily as needed. For arthritis pain    Historical Provider, MD  aspirin  81 MG tablet Take 162 mg by mouth daily.     Historical Provider, MD  HYDROcodone-acetaminophen (NORCO) 5-325 MG per tablet Take 1 tablet by mouth every 6 (six) hours as needed. For pain    Historical Provider, MD  levothyroxine (SYNTHROID, LEVOTHROID) 50 MCG tablet Take 50 mcg by mouth daily before breakfast.     Historical Provider, MD  Polyethyl Glycol-Propyl Glycol (SYSTANE OP) Place 2 drops into both eyes as needed. For eye dryness.    Historical Provider, MD  pravastatin (PRAVACHOL) 40 MG tablet Take 40 mg by mouth every evening.  06/27/11   Lewayne Bunting, MD  ramipril (ALTACE) 10 MG tablet Take 10 mg by mouth daily. 02/17/11   Rosalio Macadamia, NP  simethicone (MYLICON) 80 MG chewable tablet Chew 80 mg by mouth daily as needed. For bloating    Historical Provider, MD    Allergies:  Allergies  Allergen Reactions  . Valium Shortness Of Breath  . Codeine Other (See Comments)    unknown  . Crestor (Rosuvastatin Calcium) Other (See Comments)    unknown  . Doxycycline Other (See Comments)    uknown  . Lipitor (Atorvastatin Calcium) Other (See Comments)    unknown  .  Sulfa Drugs Cross Reactors Other (See Comments)    unknown  . Tequin Other (See Comments)    unknown  . Tylenol (Acetaminophen) Other (See Comments)    Unknown: aaNO. 3  . Vytorin Other (See Comments)    unknown  . Zocor (Simvastatin) Other (See Comments)    unknown    Social History:   reports that she quit smoking about 42 years ago. Her smoking use included Cigarettes. She has a 4 pack-year smoking history. She has never used smokeless tobacco. She reports that she does not drink alcohol or use illicit drugs.  Family History: Family History  Problem Relation Age of Onset  . Alzheimer's disease Mother     Review of Systems: Unable to obtain from Patient.    Physical Exam:  GEN:  Confused and Agitated 78 year old well nourished well developed elderly African American female examined  and in no acute  distress;  Filed Vitals:   09/12/11 1558 09/12/11 1807 09/12/11 1830 09/12/11 1911  BP:  175/83 175/83 159/52  Pulse:    94  Temp:      TempSrc:      Resp:  18 17   SpO2: 100% 100% 100% 100%   Blood pressure 159/52, pulse 94, temperature 97.9 F (36.6 C), temperature source Oral, resp. rate 17, SpO2 100.00%. PSYCH: SHe is alert and oriented x4; does not appear anxious does not appear depressed; affect is normal HEENT: Normocephalic and Atraumatic, Mucous membranes pink; PERRLA; EOM intact; Fundi:  Benign;  No scleral icterus, Nares: Patent, Oropharynx: Clear, DENTURES ( Edentulous), Neck:  FROM, no cervical lymphadenopathy nor thyromegaly or carotid bruit; no JVD; Breasts:: Not examined CHEST WALL: No tenderness CHEST: Normal respiration, clear to auscultation bilaterally HEART: Regular rate and rhythm; no murmurs rubs or gallops BACK: No kyphosis or scoliosis; no CVA tenderness ABDOMEN: Positive Bowel Sounds,  Obese, soft non-tender; no masses, no organomegaly.   Rectal Exam: Not done EXTREMITIES: No bone or joint deformity; age-appropriate arthropathy of the hands and knees; no cyanosis, clubbing or edema; no ulcerations. Genitalia: not examined PULSES: 2+ and symmetric SKIN: Normal hydration no rash or ulceration CNS: Cranial nerves 2-12 grossly intact no focal neurologic deficit   Labs & Imaging Results for orders placed during the hospital encounter of 09/12/11 (from the past 48 hour(s))  URINALYSIS, ROUTINE W REFLEX MICROSCOPIC     Status: Abnormal   Collection Time   09/12/11  4:40 PM      Component Value Range Comment   Color, Urine YELLOW  YELLOW     APPearance CLEAR  CLEAR     Specific Gravity, Urine 1.023  1.005 - 1.030     pH 7.0  5.0 - 8.0     Glucose, UA NEGATIVE  NEGATIVE (mg/dL)    Hgb urine dipstick NEGATIVE  NEGATIVE     Bilirubin Urine NEGATIVE  NEGATIVE     Ketones, ur TRACE (*) NEGATIVE (mg/dL)    Protein, ur 154 (*) NEGATIVE (mg/dL)    Urobilinogen, UA  0.2  0.0 - 1.0 (mg/dL)    Nitrite NEGATIVE  NEGATIVE     Leukocytes, UA NEGATIVE  NEGATIVE    URINE MICROSCOPIC-ADD ON     Status: Abnormal   Collection Time   09/12/11  4:40 PM      Component Value Range Comment   Squamous Epithelial / LPF RARE  RARE     WBC, UA 0-2  <3 (WBC/hpf)    RBC / HPF 0-2  <3 (RBC/hpf)  Bacteria, UA RARE  RARE     Casts HYALINE CASTS (*) NEGATIVE    CBC     Status: Abnormal   Collection Time   09/12/11  4:50 PM      Component Value Range Comment   WBC 6.2  4.0 - 10.5 (K/uL)    RBC 4.28  3.87 - 5.11 (MIL/uL)    Hemoglobin 11.8 (*) 12.0 - 15.0 (g/dL)    HCT 16.1 (*) 09.6 - 46.0 (%)    MCV 83.6  78.0 - 100.0 (fL)    MCH 27.6  26.0 - 34.0 (pg)    MCHC 33.0  30.0 - 36.0 (g/dL)    RDW 04.5  40.9 - 81.1 (%)    Platelets 272  150 - 400 (K/uL)   BASIC METABOLIC PANEL     Status: Abnormal   Collection Time   09/12/11  4:50 PM      Component Value Range Comment   Sodium 135  135 - 145 (mEq/L)    Potassium 3.6  3.5 - 5.1 (mEq/L)    Chloride 95 (*) 96 - 112 (mEq/L)    CO2 26  19 - 32 (mEq/L)    Glucose, Bld 104 (*) 70 - 99 (mg/dL)    BUN 21  6 - 23 (mg/dL)    Creatinine, Ser 9.14 (*) 0.50 - 1.10 (mg/dL)    Calcium 78.2 (*) 8.4 - 10.5 (mg/dL)    GFR calc non Af Amer 39 (*) >90 (mL/min)    GFR calc Af Amer 45 (*) >90 (mL/min)   TROPONIN I     Status: Normal   Collection Time   09/12/11  4:50 PM      Component Value Range Comment   Troponin I <0.30  <0.30 (ng/mL)    Dg Chest 2 View  09/11/2011  *RADIOLOGY REPORT*  Clinical Data: Chest pain  CHEST - 2 VIEW  Comparison: 08/14/2011  Findings: Cardiomediastinal silhouette is stable.  Again noted status post CABG.  The patient is status post right mastectomy.  No acute infiltrate or pleural effusion.  No pulmonary edema.  Stable degenerative changes thoracic spine.  IMPRESSION: No active disease.  No significant change.  Original Report Authenticated By: Natasha Mead, M.D.   Ct Angio Chest W/cm &/or Wo Cm  09/11/2011   *RADIOLOGY REPORT*  Clinical Data: Chest pain, elevated D-dimer, evaluate for PE. History of breast cancer.  CT ANGIOGRAPHY CHEST  Technique:  Multidetector CT imaging of the chest using the standard protocol during bolus administration of intravenous contrast. Multiplanar reconstructed images including MIPs were obtained and reviewed to evaluate the vascular anatomy.  Contrast:  75 ml Omnipaque-350 IV.  Comparison: Chest radiographs dated 09/11/2011.  Findings: No evidence of pulmonary embolism.  Minimal scarring versus atelectasis in the lingula and at the left lung base. Mild nodularity along the right major fissure measuring up to 3 mm (series 6/image 54).  2 mm nodule at the left lung apex (series 6/image 8).  No pleural effusion or pneumothorax.  Dominant right thyroid nodule, better characterized on prior ultrasound.  The heart is top normal in size.  No pericardial effusion. Coronary atherosclerosis.  Postsurgical changes related to prior CABG. Atherosclerotic calcifications of the aortic arch.  No suspicious mediastinal, hilar, or axillary lymphadenopathy.  Status post right mastectomy.  Visualized upper abdomen is notable for bilateral renal cysts, better characterized on recent CT abdomen pelvis.  Degenerative changes of the visualized thoracolumbar spine. Inferior endplate degenerative changes at T7.  IMPRESSION: No evidence  of pulmonary embolism.  Status post right mastectomy.  No evidence of metastatic disease in the chest.  Tiny 2-3 mm nodules in the left lung apex and along the right major fissure.  This is presumed to be unrelated to the prior history of breast cancer.  If the patient is at high risk for bronchogenic carcinoma, a single follow-up CT chest is suggested in 12 months. Otherwise, no dedicated follow-up imaging is required.  This recommendation follows the consensus statement: Guidelines for Management of Small Pulmonary Nodules Detected on CT Scans:  A Statement from the Fleischner  Society as published in Radiology 2005; 237:395-400.  Original Report Authenticated By: Charline Bills, M.D.    Assessment: Present on Admission:  .Delirium .HTN (hypertension) .CAD (coronary artery disease) .Hyperlipidemia .Hypothyroidism   Plan:    Admit for Dementia/Delirium Workup Neuro checks Check B12. Folate, RPR, and TSH levels Repeat Head CT scan Reconciled regular Meds DVT prophylaxis.   Other plans as per orders.    CODE STATUS:      FULL CODE        Nickayla Mcinnis C 09/12/2011, 8:16 PM

## 2011-09-12 NOTE — ED Provider Notes (Signed)
Medical screening examination/treatment/procedure(s) were conducted as a shared visit with non-physician practitioner(s) and myself.  I personally evaluated the patient during the encounter   Glynn Octave, MD 09/12/11 [phone]

## 2011-09-12 NOTE — ED Provider Notes (Signed)
History    78 year old female with altered mental status. Acute onset this evening. No history of trauma. No report of fever. Patient seems uncomfortable and making inappropriate comments. Per her power of attorney patient had a very similar presentation with admission for similar symptoms about a month ago. Per review her records patient was noted to be hyponatremic at 124. She is living in a nursing facility. CSN: [id number]  Arrival date & time 09/12/11  1543   First MD Initiated Contact with Patient 09/12/11 1555      Chief Complaint  Patient presents with  . Altered Mental Status    (Consider location/radiation/quality/duration/timing/severity/associated sxs/prior treatment) HPI  Past Medical History  Diagnosis Date  . IHD (ischemic heart disease)     with CABG in August 2002  . Hypertension   . Hypothyroidism   . Vitamin d deficiency   . Hyperlipidemia   . Anemia   . Hx of CABG     pt states she had 5 bypasses  . Breast cancer 1975    Right sided mastectomy  . Coronary artery disease   . Myocardial infarction     Non Q-wave MI  . Arthritis     osteoarthritis  . H/O hypokalemia     Past Surgical History  Procedure Date  . Coronary artery bypass graft 12/2000    LEFT INTERNAL MAMMARY TO THE LAD, SAPHENOUS VEIN GRAFT TO THE FIRST DIAGONAL, SAPHENOUS VEIN GRAFT TO THE RAMUS INTERMEDIATE, AND A SEQUENTIAL SAPHENOUS VEIN GRAFT TO THE POSTERIOR DESCENDING AND POSTERIOR LATERAL BRANCHES.  . Mastectomy 1975    RIGHT SIDE  . Cardiovascular stress test Aug 2010    Normal; EF 74%  . Cardiac catheterization 01/20/2001    NORMAL. EF 60-70%  . Tubal ligation     1960's  . Breast surgery     left breast cyst surgery   . Mass excision 04/08/2011    Procedure: EXCISION MASS;  Surgeon: Nicki Reaper, MD;  Location: [facility];  Service: Orthopedics;  Laterality: Left;  excision mass left palm    Family History  Problem Relation Age of Onset  . Alzheimer's  disease Mother     History  Substance Use Topics  . Smoking status: Former Smoker -- 1.0 packs/day for 4 years    Types: Cigarettes    Quit date: 06/02/1958  . Smokeless tobacco: Never Used  . Alcohol Use: No    OB History    Grav Para Term Preterm Abortions TAB SAB Ect Mult Living                  Review of Systems  Level V caveat Applies because patient is confused.  Allergies  Valium; Codeine; Crestor; Doxycycline; Lipitor; Sulfa drugs cross reactors; Tequin; Tylenol; Vytorin; and Zocor  Home Medications   Current Outpatient Rx  Name Route Sig Dispense Refill  . ACETAMINOPHEN 500 MG PO TABS Oral Take 500 mg by mouth every 6 (six) hours as needed. For pain    . ACETAMINOPHEN ER 650 MG PO TBCR Oral Take 1,300 mg by mouth daily as needed. For arthritis pain    . ASPIRIN 81 MG PO TABS Oral Take 162 mg by mouth daily.     Marland Kitchen HYDROCODONE-ACETAMINOPHEN 5-325 MG PO TABS Oral Take 1 tablet by mouth every 6 (six) hours as needed. For pain    . LEVOTHYROXINE SODIUM 50 MCG PO TABS Oral Take 50 mcg by mouth daily before breakfast.     . Frazier Butt  OP Both Eyes Place 2 drops into both eyes as needed. For eye dryness.    Marland Kitchen PRAVASTATIN SODIUM 40 MG PO TABS Oral Take 40 mg by mouth every evening.     Marland Kitchen RAMIPRIL 10 MG PO TABS Oral Take 10 mg by mouth daily.    Marland Kitchen SIMETHICONE 80 MG PO CHEW Oral Chew 80 mg by mouth daily as needed. For bloating      BP 133/61  Pulse 71  Temp(Src) 97.9 F (36.6 C) (Oral)  Resp 18  SpO2 100%  Physical Exam  Nursing note and vitals reviewed. Constitutional: She appears well-developed.       Laying in bed and occasionally rocking. Appears uncomfortable and agitated. No external signs of trauma.  HENT:  Head: Normocephalic and atraumatic.  Eyes: Conjunctivae are normal. Right eye exhibits no discharge. Left eye exhibits no discharge.  Neck: Neck supple.  Cardiovascular: Normal rate, regular rhythm and normal heart sounds.  Exam reveals no gallop and no  friction rub.   No murmur heard. Pulmonary/Chest: Effort normal and breath sounds normal. No respiratory distress.  Abdominal: Soft. She exhibits no distension. There is no tenderness.  Musculoskeletal: She exhibits no edema and no tenderness.  Neurological: She is alert.       Is disoriented to place and time. Does follow commands, although somewhat inconsistently. No focal deficits noted.  Skin: Skin is warm and dry.  Psychiatric:       Inappropriate comments and healing at times. Picking at air.    ED Course  Procedures (including critical care time)  Labs Reviewed  CBC - Abnormal; Notable for the following:    Hemoglobin 11.8 (*)    HCT 35.8 (*)    All other components within normal limits  BASIC METABOLIC PANEL - Abnormal; Notable for the following:    Chloride 95 (*)    Glucose, Bld 104 (*)    Creatinine, Ser 1.23 (*)    Calcium 10.6 (*)    GFR calc non Af Amer 39 (*)    GFR calc Af Amer 45 (*)    All other components within normal limits  URINALYSIS, ROUTINE W REFLEX MICROSCOPIC - Abnormal; Notable for the following:    Ketones, ur TRACE (*)    Protein, ur 100 (*)    All other components within normal limits  URINE MICROSCOPIC-ADD ON - Abnormal; Notable for the following:    Casts HYALINE CASTS (*)    All other components within normal limits  BASIC METABOLIC PANEL - Abnormal; Notable for the following:    Sodium 134 (*)    BUN 28 (*)    Creatinine, Ser 1.30 (*)    GFR calc non Af Amer 37 (*)    GFR calc Af Amer 42 (*)    All other components within normal limits  CBC - Abnormal; Notable for the following:    RBC 3.68 (*)    Hemoglobin 10.2 (*)    HCT 30.9 (*)    All other components within normal limits  FOLATE RBC - Abnormal; Notable for the following:    RBC Folate 684 (*) Reference range not established for pediatric patients.   All other components within normal limits  BASIC METABOLIC PANEL - Abnormal; Notable for the following:    GFR calc non Af Amer  66 (*)    GFR calc Af Amer 76 (*)    All other components within normal limits  CBC - Abnormal; Notable for the following:    WBC  3.6 (*)    RBC 3.76 (*)    Hemoglobin 10.3 (*)    HCT 31.6 (*)    All other components within normal limits  TROPONIN I  SODIUM, URINE, RANDOM  CREATININE, URINE, RANDOM  TSH  VITAMIN B12  DRUGS OF ABUSE SCREEN W/O ALC, ROUTINE URINE  PTH, INTACT AND CALCIUM  AMMONIA  URINE CULTURE  LAB REPORT - SCANNED   Dg Chest 2 View  09/11/2011  *RADIOLOGY REPORT*  Clinical Data: Chest pain  CHEST - 2 VIEW  Comparison: 08/14/2011  Findings: Cardiomediastinal silhouette is stable.  Again noted status post CABG.  The patient is status post right mastectomy.  No acute infiltrate or pleural effusion.  No pulmonary edema.  Stable degenerative changes thoracic spine.  IMPRESSION: No active disease.  No significant change.  Original Report Authenticated By: Natasha Mead, M.D.   Ct Angio Chest W/cm &/or Wo Cm  09/11/2011  *RADIOLOGY REPORT*  Clinical Data: Chest pain, elevated D-dimer, evaluate for PE. History of breast cancer.  CT ANGIOGRAPHY CHEST  Technique:  Multidetector CT imaging of the chest using the standard protocol during bolus administration of intravenous contrast. Multiplanar reconstructed images including MIPs were obtained and reviewed to evaluate the vascular anatomy.  Contrast:  75 ml Omnipaque-350 IV.  Comparison: Chest radiographs dated 09/11/2011.  Findings: No evidence of pulmonary embolism.  Minimal scarring versus atelectasis in the lingula and at the left lung base. Mild nodularity along the right major fissure measuring up to 3 mm (series 6/image 54).  2 mm nodule at the left lung apex (series 6/image 8).  No pleural effusion or pneumothorax.  Dominant right thyroid nodule, better characterized on prior ultrasound.  The heart is top normal in size.  No pericardial effusion. Coronary atherosclerosis.  Postsurgical changes related to prior CABG. Atherosclerotic  calcifications of the aortic arch.  No suspicious mediastinal, hilar, or axillary lymphadenopathy.  Status post right mastectomy.  Visualized upper abdomen is notable for bilateral renal cysts, better characterized on recent CT abdomen pelvis.  Degenerative changes of the visualized thoracolumbar spine. Inferior endplate degenerative changes at T7.  IMPRESSION: No evidence of pulmonary embolism.  Status post right mastectomy.  No evidence of metastatic disease in the chest.  Tiny 2-3 mm nodules in the left lung apex and along the right major fissure.  This is presumed to be unrelated to the prior history of breast cancer.  If the patient is at high risk for bronchogenic carcinoma, a single follow-up CT chest is suggested in 12 months. Otherwise, no dedicated follow-up imaging is required.  This recommendation follows the consensus statement: Guidelines for Management of Small Pulmonary Nodules Detected on CT Scans:  A Statement from the Fleischner Society as published in Radiology 2005; 237:395-400.  Original Report Authenticated By: Charline Bills, M.D.   EKG:  Rhythm: normal sinus Rate: 67 Axis: L Intervals: LVH ST segments: NS ST changes   1. Delirium       MDM  78 year old female with altered mental status. Patient appears to be delirious. Etiology is unclear at this time. Previously noted hyponatremia has resolved. Workup fairly unremarkable.: Med for further hydration.        Raeford Razor, MD 09/23/11 [phone]

## 2011-09-13 LAB — CBC
HCT: 30.9 % — ABNORMAL LOW (ref 36.0–46.0)
Hemoglobin: 10.2 g/dL — ABNORMAL LOW (ref 12.0–15.0)
MCHC: 33 g/dL (ref 30.0–36.0)
MCV: 84 fL (ref 78.0–100.0)
RDW: 14.3 % (ref 11.5–15.5)

## 2011-09-13 LAB — DRUGS OF ABUSE SCREEN W/O ALC, ROUTINE URINE
Amphetamine Screen, Ur: NEGATIVE
Barbiturate Quant, Ur: NEGATIVE
Cocaine Metabolites: NEGATIVE
Methadone: NEGATIVE
Phencyclidine (PCP): NEGATIVE
Propoxyphene: NEGATIVE

## 2011-09-13 LAB — BASIC METABOLIC PANEL
BUN: 28 mg/dL — ABNORMAL HIGH (ref 6–23)
CO2: 24 mEq/L (ref 19–32)
Chloride: 100 mEq/L (ref 96–112)
Creatinine, Ser: 1.3 mg/dL — ABNORMAL HIGH (ref 0.50–1.10)
Glucose, Bld: 91 mg/dL (ref 70–99)
Potassium: 3.8 mEq/L (ref 3.5–5.1)

## 2011-09-13 NOTE — Progress Notes (Signed)
Subjective: Patient AAOX2; still mentation is not back to baseline. Fever overnight; no CP, no SOB.  Objective: Vital signs in last 24 hours: Temp:  [97.6 F (36.4 C)-101.3 F (38.5 C)] 98.1 F (36.7 C) (04/13 0547) Pulse Rate:  [54-94] 54  (04/13 0547) Resp:  [16-18] 16  (04/13 0547) BP: (100-177)/(45-83) 100/51 mmHg (04/13 0547) SpO2:  [96 %-100 %] 100 % (04/13 0547) Weight:  [59 kg (130 lb 1.1 oz)] 59 kg (130 lb 1.1 oz) (04/12 2100) Weight change:  Last BM Date: 09/11/11  Intake/Output from previous day:       Physical Exam: General: Alert, awake, oriented x2, in no acute distress. HEENT: No bruits, no goiter. Heart: Regular rate and rhythm, without murmurs, rubs, gallops. Lungs: Clear to auscultation bilaterally. Abdomen: Soft, nontender, nondistended, positive bowel sounds. Extremities: No clubbing, cyanosis or edema with positive pedal pulses. Neuro: Grossly intact, nonfocal.  Lab Results: Basic Metabolic Panel:  Basename 09/13/11 0609 09/12/11 1650  NA 134* 135  K 3.8 3.6  CL 100 95*  CO2 24 26  GLUCOSE 91 104*  BUN 28* 21  CREATININE 1.30* 1.23*  CALCIUM 9.8 10.6*  MG -- --  PHOS -- --   Liver Function Tests:  Basename 09/11/11 1156  AST 17  ALT 12  ALKPHOS 58  BILITOT 0.4  PROT 7.5  ALBUMIN 4.0    Basename 09/11/11 1156  LIPASE 25  AMYLASE --   CBC:  Basename 09/13/11 0609 09/12/11 1650 09/11/11 1156  WBC 4.7 6.2 --  NEUTROABS -- -- 3.8  HGB 10.2* 11.8* --  HCT 30.9* 35.8* --  MCV 84.0 83.6 --  PLT 211 272 --   Cardiac Enzymes:  Basename 09/12/11 1650 09/11/11 1152  CKTOTAL -- --  CKMB -- --  CKMBINDEX -- --  TROPONINI <0.30 <0.30   D-Dimer:  Basename 09/11/11 1156  DDIMER 1.12*   Urine Drug Screen: Drugs of Abuse     Component Value Date/Time   LABOPIA NEGATIVE 09/13/2011 0346   COCAINSCRNUR NEGATIVE 09/13/2011 0346   LABBENZ NEGATIVE 09/13/2011 0346   AMPHETMU NEGATIVE 09/13/2011 0346    Urinalysis:  Basename  09/12/11 1640 09/11/11 1230  COLORURINE YELLOW YELLOW  LABSPEC 1.023 1.015  PHURINE 7.0 8.0  GLUCOSEU NEGATIVE NEGATIVE  HGBUR NEGATIVE NEGATIVE  BILIRUBINUR NEGATIVE NEGATIVE  KETONESUR TRACE* NEGATIVE  PROTEINUR 100* 30*  UROBILINOGEN 0.2 0.2  NITRITE NEGATIVE NEGATIVE  LEUKOCYTESUR NEGATIVE TRACE*    Studies/Results: Dg Chest 2 View  09/11/2011  *RADIOLOGY REPORT*  Clinical Data: Chest pain  CHEST - 2 VIEW  Comparison: 08/14/2011  Findings: Cardiomediastinal silhouette is stable.  Again noted status post CABG.  The patient is status post right mastectomy.  No acute infiltrate or pleural effusion.  No pulmonary edema.  Stable degenerative changes thoracic spine.  IMPRESSION: No active disease.  No significant change.  Original Report Authenticated By: Natasha Mead, M.D.   Ct Head Wo Contrast  09/13/2011  *RADIOLOGY REPORT*  Clinical Data: Delirium  CT HEAD WITHOUT CONTRAST  Technique:  Contiguous axial images were obtained from the base of the skull through the vertex without contrast.  Comparison: [facility] brain dated 08/16/2011  Findings: No evidence of parenchymal hemorrhage or extra-axial fluid collection. No mass lesion, mass effect, or midline shift.  No CT evidence of acute infarction.  Mild subcortical white matter and periventricular small vessel ischemic changes.  Intracranial atherosclerosis.  Cerebral volume is age appropriate.  No ventriculomegaly.  The visualized paranasal sinuses are essentially clear.  The mastoid air cells are unopacified.  No evidence of calvarial fracture.  IMPRESSION: No evidence of acute intracranial abnormality.  Mild small vessel ischemic changes with intracranial atherosclerosis.  Original Report Authenticated By: Charline Bills, M.D.   Ct Angio Chest W/cm &/or Wo Cm  09/11/2011  *RADIOLOGY REPORT*  Clinical Data: Chest pain, elevated D-dimer, evaluate for PE. History of breast cancer.  CT ANGIOGRAPHY CHEST  Technique:  Multidetector CT imaging of  the chest using the standard protocol during bolus administration of intravenous contrast. Multiplanar reconstructed images including MIPs were obtained and reviewed to evaluate the vascular anatomy.  Contrast:  75 ml Omnipaque-350 IV.  Comparison: Chest radiographs dated 09/11/2011.  Findings: No evidence of pulmonary embolism.  Minimal scarring versus atelectasis in the lingula and at the left lung base. Mild nodularity along the right major fissure measuring up to 3 mm (series 6/image 54).  2 mm nodule at the left lung apex (series 6/image 8).  No pleural effusion or pneumothorax.  Dominant right thyroid nodule, better characterized on prior ultrasound.  The heart is top normal in size.  No pericardial effusion. Coronary atherosclerosis.  Postsurgical changes related to prior CABG. Atherosclerotic calcifications of the aortic arch.  No suspicious mediastinal, hilar, or axillary lymphadenopathy.  Status post right mastectomy.  Visualized upper abdomen is notable for bilateral renal cysts, better characterized on recent CT abdomen pelvis.  Degenerative changes of the visualized thoracolumbar spine. Inferior endplate degenerative changes at T7.  IMPRESSION: No evidence of pulmonary embolism.  Status post right mastectomy.  No evidence of metastatic disease in the chest.  Tiny 2-3 mm nodules in the left lung apex and along the right major fissure.  This is presumed to be unrelated to the prior history of breast cancer.  If the patient is at high risk for bronchogenic carcinoma, a single follow-up CT chest is suggested in 12 months. Otherwise, no dedicated follow-up imaging is required.  This recommendation follows the consensus statement: Guidelines for Management of Small Pulmonary Nodules Detected on CT Scans:  A Statement from the Fleischner Society as published in Radiology 2005; 237:395-400.  Original Report Authenticated By: Charline Bills, M.D.    Medications: Scheduled Meds:   . aspirin  162 mg Oral  Daily  . enoxaparin (LOVENOX) injection  30 mg Subcutaneous Q24H  . haloperidol lactate  3 mg Intravenous Once  . levothyroxine  50 mcg Oral QAC breakfast  . sodium chloride  1,000 mL Intravenous Once  . sodium chloride  3 mL Intravenous Q12H  . DISCONTD: aspirin  162 mg Oral Daily  . DISCONTD: enoxaparin  40 mg Subcutaneous Q24H  . DISCONTD: ramipril  10 mg Oral Daily  . DISCONTD: ramipril  10 mg Oral Daily   Continuous Infusions:   . sodium chloride 75 mL/hr at 09/13/11 1136   PRN Meds:.sodium chloride, acetaminophen, acetaminophen, alum & mag hydroxide-simeth, HYDROmorphone, ondansetron (ZOFRAN) IV, ondansetron (ZOFRAN) IV, ondansetron, oxyCODONE, polyvinyl alcohol, sodium chloride, zolpidem, DISCONTD: Polyethyl Glycol-Propyl Glycol  Assessment/Plan: 1-Delirium: CT negative and recent MRI also unremarkable except for small vessel disease. TSH, B12 level, PTH, RPR and ammonia are pending at this point. Will also order urine culture; since she spike fever. MMS 23/30.  2-CAD (coronary artery disease):CE'z negative and currently w/o chest pain. Will continue low dose aspirin.  3-Hyperlipidemia: plan is to restart statins at discharge.  4-Hypothyroidism: continue synthroid.  5-HTN (hypertension): will check orthostatic VS; at this point will hold ramipril, since Cr is eleavated.  6-ARF: appears to be associated  with pre-renal azotemia and the use of ramipril, + or - UTI. Will hold ramipril, provide gentle hydration and will follow BMET in am.  7-DVT:lovenox    LOS: 1 day   D[place] Cantero [facility] [phone]  09/13/2011, 2:10 PM

## 2011-09-14 LAB — BASIC METABOLIC PANEL
GFR calc Af Amer: 76 mL/min — ABNORMAL LOW (ref >90)
GFR calc non Af Amer: 66 mL/min — ABNORMAL LOW (ref >90)
Potassium: 4 mEq/L (ref 3.5–5.1)
Sodium: 139 mEq/L (ref 135–145)

## 2011-09-14 LAB — CBC
Hemoglobin: 10.3 g/dL — ABNORMAL LOW (ref 12.0–15.0)
RBC: 3.76 MIL/uL — ABNORMAL LOW (ref 3.87–5.11)

## 2011-09-14 MED ORDER — AMLODIPINE BESYLATE 5 MG PO TABS
5.0000 mg | ORAL_TABLET | Freq: Every day | ORAL | Status: DC
  Administered 2011-09-14 – 2011-09-15 (×2): 5 mg via ORAL
  Filled 2011-09-14 (×3): qty 1

## 2011-09-14 MED ORDER — DONEPEZIL HCL 5 MG PO TABS
5.0000 mg | ORAL_TABLET | Freq: Every day | ORAL | Status: DC
  Administered 2011-09-14 – 2011-09-15 (×2): 5 mg via ORAL
  Filled 2011-09-14 (×4): qty 1

## 2011-09-14 MED ORDER — ENOXAPARIN SODIUM 40 MG/0.4ML ~~LOC~~ SOLN
40.0000 mg | SUBCUTANEOUS | Status: DC
  Administered 2011-09-14 – 2011-09-15 (×2): 40 mg via SUBCUTANEOUS
  Filled 2011-09-14 (×4): qty 0.4

## 2011-09-14 MED ORDER — ADULT MULTIVITAMIN W/MINERALS CH
1.0000 | ORAL_TABLET | Freq: Every day | ORAL | Status: DC
  Administered 2011-09-14 – 2011-09-16 (×3): 1 via ORAL
  Filled 2011-09-14 (×4): qty 1

## 2011-09-14 MED ORDER — HYDROMORPHONE HCL PF 1 MG/ML IJ SOLN: 0.5000 mg | Freq: Three times a day (TID) | INTRAMUSCULAR | Status: DC | PRN

## 2011-09-14 MED ORDER — ENSURE COMPLETE PO LIQD
237.0000 mL | Freq: Two times a day (BID) | ORAL | Status: DC
  Administered 2011-09-14 – 2011-09-16 (×5): 237 mL via ORAL

## 2011-09-14 NOTE — Consult Note (Signed)
Reason for Consult: "episodes of confusion"  HPI: Natalie Bullock is an 78 y.o. Female who for the last 2 months per family has been having episodes of confusion for which she has been hospitalized several times. She becomes confused and disoriented and does not recognize other family member. Prior to these episodes, she has been highly functional per family. She denies being sad or depressed.   Past Medical History  Diagnosis Date  . IHD (ischemic heart disease)     with CABG in August 2002  . Hypertension   . Hypothyroidism   . Vitamin d deficiency   . Hyperlipidemia   . Anemia   . Hx of CABG     pt states she had 5 bypasses  . Breast cancer 1975    Right sided mastectomy  . Coronary artery disease   . Myocardial infarction     Non Q-wave MI  . Arthritis     osteoarthritis  . H/O hypokalemia    Medications: I have reviewed the patient's current medications.  Past Surgical History  Procedure Date  . Coronary artery bypass graft 12/2000    LEFT INTERNAL MAMMARY TO THE LAD, SAPHENOUS VEIN GRAFT TO THE FIRST DIAGONAL, SAPHENOUS VEIN GRAFT TO THE RAMUS INTERMEDIATE, AND A SEQUENTIAL SAPHENOUS VEIN GRAFT TO THE POSTERIOR DESCENDING AND POSTERIOR LATERAL BRANCHES.  . Mastectomy 1975    RIGHT SIDE  . Cardiovascular stress test Aug 2010    Normal; EF 74%  . Cardiac catheterization 01/20/2001    NORMAL. EF 60-70%  . Tubal ligation     1960's  . Breast surgery     left breast cyst surgery   . Mass excision 04/08/2011    Procedure: EXCISION MASS;  Surgeon: Nicki Reaper, MD;  Location: [facility];  Service: Orthopedics;  Laterality: Left;  excision mass left palm   Family History  Problem Relation Age of Onset  . Alzheimer's disease Mother    Social History:  reports that she quit smoking about 42 years ago. Her smoking use included Cigarettes. She has a 4 pack-year smoking history. She has never used smokeless tobacco. She reports that she does not drink alcohol or use  illicit drugs.  Allergies:  Allergies  Allergen Reactions  . Valium Shortness Of Breath  . Codeine Other (See Comments)    unknown  . Crestor (Rosuvastatin Calcium) Other (See Comments)    unknown  . Doxycycline Other (See Comments)    uknown  . Lipitor (Atorvastatin Calcium) Other (See Comments)    unknown  . Sulfa Drugs Cross Reactors Other (See Comments)    unknown  . Tequin Other (See Comments)    unknown  . Tylenol (Acetaminophen) Other (See Comments)    Unknown: aaNO. 3  . Vytorin Other (See Comments)    unknown  . Zocor (Simvastatin) Other (See Comments)    unknown   ROS: as above  Blood pressure 148/71, pulse 57, temperature 97.4 F (36.3 C), temperature source Oral, resp. rate 20, height 5\' 6"  (1.676 m), weight 59 kg (130 lb 1.1 oz), SpO2 97.00%.  Neurological exam: inappropriate and aggressive behavior, Recall was 0 of 3. Followed complex commands. Likely some degree of expressive aphasia. Able to say months of the year forward correctly, but not backwards (missed March), could not tell me the name of the president. CN: EOMI, PERRL, blink to threat positive bilaterally, no facial asymmetry, tongue midline, motor: no drift, 5/5 strength in all extremities b/l, sensory: no deficit to LT,  Coord: F to N intact b/l Reflexes: 1+ throughout, withdrew lower extremities b/l, gait: deferred.   Results for orders placed during the hospital encounter of 09/12/11 (from the past 48 hour(s))  URINALYSIS, ROUTINE W REFLEX MICROSCOPIC     Status: Abnormal   Collection Time   09/12/11  4:40 PM      Component Value Range Comment   Color, Urine YELLOW  YELLOW     APPearance CLEAR  CLEAR     Specific Gravity, Urine 1.023  1.005 - 1.030     pH 7.0  5.0 - 8.0     Glucose, UA NEGATIVE  NEGATIVE (mg/dL)    Hgb urine dipstick NEGATIVE  NEGATIVE     Bilirubin Urine NEGATIVE  NEGATIVE     Ketones, ur TRACE (*) NEGATIVE (mg/dL)    Protein, ur 161 (*) NEGATIVE (mg/dL)    Urobilinogen, UA  0.2  0.0 - 1.0 (mg/dL)    Nitrite NEGATIVE  NEGATIVE     Leukocytes, UA NEGATIVE  NEGATIVE    URINE MICROSCOPIC-ADD ON     Status: Abnormal   Collection Time   09/12/11  4:40 PM      Component Value Range Comment   Squamous Epithelial / LPF RARE  RARE     WBC, UA 0-2  <3 (WBC/hpf)    RBC / HPF 0-2  <3 (RBC/hpf)    Bacteria, UA RARE  RARE     Casts HYALINE CASTS (*) NEGATIVE    CBC     Status: Abnormal   Collection Time   09/12/11  4:50 PM      Component Value Range Comment   WBC 6.2  4.0 - 10.5 (K/uL)    RBC 4.28  3.87 - 5.11 (MIL/uL)    Hemoglobin 11.8 (*) 12.0 - 15.0 (g/dL)    HCT 09.6 (*) 04.5 - 46.0 (%)    MCV 83.6  78.0 - 100.0 (fL)    MCH 27.6  26.0 - 34.0 (pg)    MCHC 33.0  30.0 - 36.0 (g/dL)    RDW 40.9  81.1 - 91.4 (%)    Platelets 272  150 - 400 (K/uL)   BASIC METABOLIC PANEL     Status: Abnormal   Collection Time   09/12/11  4:50 PM      Component Value Range Comment   Sodium 135  135 - 145 (mEq/L)    Potassium 3.6  3.5 - 5.1 (mEq/L)    Chloride 95 (*) 96 - 112 (mEq/L)    CO2 26  19 - 32 (mEq/L)    Glucose, Bld 104 (*) 70 - 99 (mg/dL)    BUN 21  6 - 23 (mg/dL)    Creatinine, Ser 7.82 (*) 0.50 - 1.10 (mg/dL)    Calcium 95.6 (*) 8.4 - 10.5 (mg/dL)    GFR calc non Af Amer 39 (*) >90 (mL/min)    GFR calc Af Amer 45 (*) >90 (mL/min)   TROPONIN I     Status: Normal   Collection Time   09/12/11  4:50 PM      Component Value Range Comment   Troponin I <0.30  <0.30 (ng/mL)   SODIUM, URINE, RANDOM     Status: Normal   Collection Time   09/12/11  5:35 PM      Component Value Range Comment   Sodium, Ur 111     CREATININE, URINE, RANDOM     Status: Normal   Collection Time   09/12/11  5:35 PM  Component Value Range Comment   Creatinine, Urine 154.50     DRUGS OF ABUSE SCREEN W/O ALC, ROUTINE URINE     Status: Normal   Collection Time   09/13/11  3:46 AM      Component Value Range Comment   Marijuana Metabolite NEGATIVE  Negative     Amphetamine Screen, Ur  NEGATIVE  Negative     Barbiturate Quant, Ur NEGATIVE  Negative     Methadone NEGATIVE  Negative     Benzodiazepines. NEGATIVE  Negative     Phencyclidine (PCP) NEGATIVE  Negative     Cocaine Metabolites NEGATIVE  Negative     Opiate Screen, Urine NEGATIVE  Negative     Propoxyphene NEGATIVE  Negative     Creatinine,U 206.1     BASIC METABOLIC PANEL     Status: Abnormal   Collection Time   09/13/11  6:09 AM      Component Value Range Comment   Sodium 134 (*) 135 - 145 (mEq/L)    Potassium 3.8  3.5 - 5.1 (mEq/L)    Chloride 100  96 - 112 (mEq/L)    CO2 24  19 - 32 (mEq/L)    Glucose, Bld 91  70 - 99 (mg/dL)    BUN 28 (*) 6 - 23 (mg/dL)    Creatinine, Ser 1.61 (*) 0.50 - 1.10 (mg/dL)    Calcium 9.8  8.4 - 10.5 (mg/dL)    GFR calc non Af Amer 37 (*) >90 (mL/min)    GFR calc Af Amer 42 (*) >90 (mL/min)   CBC     Status: Abnormal   Collection Time   09/13/11  6:09 AM      Component Value Range Comment   WBC 4.7  4.0 - 10.5 (K/uL)    RBC 3.68 (*) 3.87 - 5.11 (MIL/uL)    Hemoglobin 10.2 (*) 12.0 - 15.0 (g/dL)    HCT 09.6 (*) 04.5 - 46.0 (%)    MCV 84.0  78.0 - 100.0 (fL)    MCH 27.7  26.0 - 34.0 (pg)    MCHC 33.0  30.0 - 36.0 (g/dL)    RDW 40.9  81.1 - 91.4 (%)    Platelets 211  150 - 400 (K/uL)   TSH     Status: Normal   Collection Time   09/13/11  6:09 AM      Component Value Range Comment   TSH 0.718  0.350 - 4.500 (uIU/mL)   VITAMIN B12     Status: Normal   Collection Time   09/13/11  6:09 AM      Component Value Range Comment   Vitamin B-12 605  211 - 911 (pg/mL)   AMMONIA     Status: Normal   Collection Time   09/13/11  3:21 PM      Component Value Range Comment   Ammonia 48  11 - 60 (umol/L)   BASIC METABOLIC PANEL     Status: Abnormal   Collection Time   09/14/11  5:44 AM      Component Value Range Comment   Sodium 139  135 - 145 (mEq/L) REPEATED TO VERIFY   Potassium 4.0  3.5 - 5.1 (mEq/L) REPEATED TO VERIFY   Chloride 106  96 - 112 (mEq/L) REPEATED TO VERIFY    CO2 22  19 - 32 (mEq/L) REPEATED TO VERIFY   Glucose, Bld 74  70 - 99 (mg/dL) REPEATED TO VERIFY   BUN 21  6 - 23 (mg/dL) REPEATED  TO VERIFY   Creatinine, Ser 0.80  0.50 - 1.10 (mg/dL)    Calcium 9.3  8.4 - 10.5 (mg/dL) REPEATED TO VERIFY   GFR calc non Af Amer 66 (*) >90 (mL/min)    GFR calc Af Amer 76 (*) >90 (mL/min)   CBC     Status: Abnormal   Collection Time   09/14/11  5:44 AM      Component Value Range Comment   WBC 3.6 (*) 4.0 - 10.5 (K/uL)    RBC 3.76 (*) 3.87 - 5.11 (MIL/uL)    Hemoglobin 10.3 (*) 12.0 - 15.0 (g/dL)    HCT 45.4 (*) 09.8 - 46.0 (%)    MCV 84.0  78.0 - 100.0 (fL)    MCH 27.4  26.0 - 34.0 (pg)    MCHC 32.6  30.0 - 36.0 (g/dL)    RDW 11.9  14.7 - 82.9 (%)    Platelets 190  150 - 400 (K/uL)    Ct Head Wo Contrast  09/13/2011  *RADIOLOGY REPORT*  Clinical Data: Delirium  CT HEAD WITHOUT CONTRAST  Technique:  Contiguous axial images were obtained from the base of the skull through the vertex without contrast.  Comparison: [facility] MRI brain dated 08/16/2011  Findings: No evidence of parenchymal hemorrhage or extra-axial fluid collection. No mass lesion, mass effect, or midline shift.  No CT evidence of acute infarction.  Mild subcortical white matter and periventricular small vessel ischemic changes.  Intracranial atherosclerosis.  Cerebral volume is age appropriate.  No ventriculomegaly.  The visualized paranasal sinuses are essentially clear. The mastoid air cells are unopacified.  No evidence of calvarial fracture.  IMPRESSION: No evidence of acute intracranial abnormality.  Mild small vessel ischemic changes with intracranial atherosclerosis.  Original Report Authenticated By: Charline Bills, M.D.   Assessment/Plan: 78 years old woman who has had episodes of confusion for the last 2 months - who exhibits evidence of dementia on her clinical exam and by history. She also has temporal lobe atrophy and generalized brain atrophy on MRI that are consistent with the  diagnosis as well as family history of AD.  Reversible dementia labs are wnl. Delirium is a frequent finding in advanced dementia and patient will need placement in a facility that understands patient's condition and has a geriatric doctor that can help manage her symptoms. She will need neurology follow-up. Aricept can be started, but doubtful it would make a significant difference at this stage. I will talk to family tomorrow. Call with questions.   Zyanne Schumm 09/14/2011, 1:13 PM

## 2011-09-14 NOTE — Progress Notes (Signed)
Subjective: Patient AAOX2; still mentation is still fluctutating. Work up so far indicating dementia as most likely diagnosis. No CP, no SOB and afebrile  Objective: Vital signs in last 24 hours: Temp:  [97.4 F (36.3 C)-98.4 F (36.9 C)] 97.4 F (36.3 C) (04/14 1013) Pulse Rate:  [48-63] 57  (04/14 1013) Resp:  [18-20] 20  (04/14 1013) BP: (121-158)/(46-71) 148/71 mmHg (04/14 1013) SpO2:  [97 %-100 %] 97 % (04/14 1013) Weight change:  Last BM Date: 09/11/11  Intake/Output from previous day: 04/13 0701 - 04/14 0700 In: 360 [P.O.:360] Out: -  Total I/O In: 120 [P.O.:120] Out: -    Physical Exam: General: Alert, awake, oriented x2, in no acute distress. HEENT: No bruits, no goiter. Heart: Regular rate and rhythm, without murmurs, rubs, gallops. Lungs: Clear to auscultation bilaterally. Abdomen: Soft, nontender, nondistended, positive bowel sounds. Extremities: No clubbing, cyanosis or edema with positive pedal pulses. Neuro: Grossly intact, nonfocal.  Lab Results: Basic Metabolic Panel:  Basename 09/14/11 0544 09/13/11 0609  NA 139 134*  K 4.0 3.8  CL 106 100  CO2 22 24  GLUCOSE 74 91  BUN 21 28*  CREATININE 0.80 1.30*  CALCIUM 9.3 9.8  MG -- --  PHOS -- --   CBC:  Basename 09/14/11 0544 09/13/11 0609  WBC 3.6* 4.7  NEUTROABS -- --  HGB 10.3* 10.2*  HCT 31.6* 30.9*  MCV 84.0 84.0  PLT 190 211   Cardiac Enzymes:  Basename 09/12/11 1650  CKTOTAL --  CKMB --  CKMBINDEX --  TROPONINI <0.30   Urine Drug Screen: Drugs of Abuse     Component Value Date/Time   LABOPIA NEGATIVE 09/13/2011 0346   COCAINSCRNUR NEGATIVE 09/13/2011 0346   LABBENZ NEGATIVE 09/13/2011 0346   AMPHETMU NEGATIVE 09/13/2011 0346    Urinalysis:  Basename 09/12/11 1640  COLORURINE YELLOW  LABSPEC 1.023  PHURINE 7.0  GLUCOSEU NEGATIVE  HGBUR NEGATIVE  BILIRUBINUR NEGATIVE  KETONESUR TRACE*  PROTEINUR 100*  UROBILINOGEN 0.2  NITRITE NEGATIVE  LEUKOCYTESUR NEGATIVE     Studies/Results: Ct Head Wo Contrast  09/13/2011  *RADIOLOGY REPORT*  Clinical Data: Delirium  CT HEAD WITHOUT CONTRAST  Technique:  Contiguous axial images were obtained from the base of the skull through the vertex without contrast.  Comparison: [facility] MRI brain dated 08/16/2011  Findings: No evidence of parenchymal hemorrhage or extra-axial fluid collection. No mass lesion, mass effect, or midline shift.  No CT evidence of acute infarction.  Mild subcortical white matter and periventricular small vessel ischemic changes.  Intracranial atherosclerosis.  Cerebral volume is age appropriate.  No ventriculomegaly.  The visualized paranasal sinuses are essentially clear. The mastoid air cells are unopacified.  No evidence of calvarial fracture.  IMPRESSION: No evidence of acute intracranial abnormality.  Mild small vessel ischemic changes with intracranial atherosclerosis.  Original Report Authenticated By: Charline Bills, M.D.    Medications: Scheduled Meds:    . amLODipine  5 mg Oral Daily  . aspirin  162 mg Oral Daily  . donepezil  5 mg Oral QHS  . enoxaparin (LOVENOX) injection  40 mg Subcutaneous Q24H  . levothyroxine  50 mcg Oral QAC breakfast  . sodium chloride  3 mL Intravenous Q12H  . DISCONTD: enoxaparin (LOVENOX) injection  30 mg Subcutaneous Q24H  . DISCONTD: ramipril  10 mg Oral Daily   Continuous Infusions:    . sodium chloride 75 mL/hr at 09/14/11 0125   PRN Meds:.sodium chloride, acetaminophen, acetaminophen, alum & mag hydroxide-simeth, HYDROmorphone, ondansetron (ZOFRAN) IV,  ondansetron, oxyCODONE, polyvinyl alcohol, sodium chloride, DISCONTD: zolpidem  Assessment/Plan: 1-Delirium: CT negative and recent MRI also unremarkable except for small vessel disease. TSH, B12 level, RPR and ammonia WNL. PTH and urine cx pending at this point. No further fever; MMS 23/30. 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 Will start low dose aricept. Overall doing better.  2-CAD (coronary artery disease):CE'z negative and currently w/o chest pain. Will continue low dose aspirin.  3-Hyperlipidemia: plan is to restart statins at discharge.  4-Hypothyroidism: continue synthroid.  5-HTN (hypertension): After IVF's given BP much better. Will discontinue ramipril and use low dose of amlodipine instead to maintain BP control.  6-ARF: Resolved with IVF's. Presumed to be associated with dehydration and the use of ramipril.  7-DVT:continue lovenox  8-Protein calorie malnutrition and hypoalbuminemia: will start MV and ensure TID.   LOS: 2 days   Vedanshi Massaro [facility] [phone]  09/14/2011, 1:59 PM

## 2011-09-15 MED ORDER — AMLODIPINE BESYLATE 10 MG PO TABS
10.0000 mg | ORAL_TABLET | Freq: Every day | ORAL | Status: DC
  Administered 2011-09-16: 10 mg via ORAL
  Filled 2011-09-15 (×2): qty 1

## 2011-09-15 NOTE — Progress Notes (Signed)
Subjective: Patient AAOX2; mentatation is even better today. 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 No CP, no SOB and afebrile  Objective: Vital signs in last 24 hours: Temp:  [98.3 F (36.8 C)-98.6 F (37 C)] 98.4 F (36.9 C) (04/15 1300) Pulse Rate:  [51-58] 54  (04/15 1300) Resp:  [18] 18  (04/15 1300) BP: (132-157)/(48-69) 157/57 mmHg (04/15 1300) SpO2:  [99 %-100 %] 99 % (04/15 1300) Weight change:  Last BM Date: 09/15/11  Intake/Output from previous day: 04/14 0701 - 04/15 0700 In: 840 [P.O.:840] Out: -  Total I/O In: 790 [P.O.:390; I.V.:400] Out: -    Physical Exam: General: Alert, awake, oriented x2, in no acute distress. HEENT: No bruits, no goiter. Heart: Regular rate and rhythm, without murmurs, rubs, gallops. Lungs: Clear to auscultation bilaterally. Abdomen: Soft, nontender, nondistended, positive bowel sounds. Extremities: No clubbing, cyanosis or edema with positive pedal pulses. Neuro: Grossly intact, nonfocal.  Lab Results: Basic Metabolic Panel:  Basename 09/14/11 0544 09/13/11 1520  NA 139 --  K 4.0 --  CL 106 --  CO2 22 --  GLUCOSE 74 --  BUN 21 --  CREATININE 0.80 --  CALCIUM 9.3 9.5  MG -- --  PHOS -- --   CBC:  Basename 09/14/11 0544 09/13/11 0609  WBC 3.6* 4.7  NEUTROABS -- --  HGB 10.3* 10.2*  HCT 31.6* 30.9*  MCV 84.0 84.0  PLT 190 211   Urine Drug Screen: Drugs of Abuse     Component Value Date/Time   LABOPIA NEGATIVE 09/13/2011 0346   COCAINSCRNUR NEGATIVE 09/13/2011 0346   LABBENZ NEGATIVE 09/13/2011 0346   AMPHETMU NEGATIVE 09/13/2011 0346     Studies/Results: No results found.  Medications: Scheduled Meds:    . amLODipine  5 mg Oral Daily  . aspirin  162 mg Oral Daily  . donepezil  5 mg Oral QHS  . enoxaparin (LOVENOX) injection  40 mg Subcutaneous Q24H  . feeding supplement  237 mL Oral BID BM  . levothyroxine  50 mcg Oral QAC breakfast  . mulitivitamin with minerals  1 tablet  Oral Daily  . sodium chloride  3 mL Intravenous Q12H   Continuous Infusions:    . sodium chloride 50 mL/hr at 09/15/11 1320   PRN Meds:.sodium chloride, acetaminophen, acetaminophen, alum & mag hydroxide-simeth, HYDROmorphone, ondansetron (ZOFRAN) IV, ondansetron, polyvinyl alcohol, sodium chloride  Assessment/Plan: 1-Delirium: CT negative and recent MRI also unremarkable except for small vessel disease. TSH, B12 level, RPR and ammonia WNL. PTH Normal. Urine cx reincubated; but so far negative. No further fever; MMS 22-23/30. 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 Will continue low dose aricept as recommended. Overall doing better and closer to new baseline. Plan and findings discussed with family and HCPOA; plan is to find ALF with memory unit; SW notified.  2-CAD (coronary artery disease):CE'z negative and currently w/o chest pain. Will continue low dose aspirin.  3-Hyperlipidemia: Will restart statins at discharge.  4-Hypothyroidism: continue synthroid.  5-HTN (hypertension): After IVF's given, BP much better. Will use amlodipine instead of ramipril for BP control.  6-ARF: Resolved with IVF's. Presumed to be associated with dehydration and the use of ramipril.  7-DVT:continue lovenox  8-Protein calorie malnutrition and hypoalbuminemia: will continue MV and ensure TID.   LOS: 3 days   Leanna Hamid [facility] [phone]  09/15/2011, 6:16 PM

## 2011-09-16 DIAGNOSIS — S0180XA Unspecified open wound of other part of head, initial encounter: Secondary | ICD-10-CM | POA: Diagnosis not present

## 2011-09-16 DIAGNOSIS — F0390 Unspecified dementia without behavioral disturbance: Secondary | ICD-10-CM | POA: Diagnosis not present

## 2011-09-16 DIAGNOSIS — M6281 Muscle weakness (generalized): Secondary | ICD-10-CM | POA: Diagnosis not present

## 2011-09-16 DIAGNOSIS — R42 Dizziness and giddiness: Secondary | ICD-10-CM | POA: Diagnosis not present

## 2011-09-16 DIAGNOSIS — S064X9A Epidural hemorrhage with loss of consciousness of unspecified duration, initial encounter: Secondary | ICD-10-CM | POA: Diagnosis not present

## 2011-09-16 DIAGNOSIS — D259 Leiomyoma of uterus, unspecified: Secondary | ICD-10-CM | POA: Diagnosis not present

## 2011-09-16 DIAGNOSIS — M129 Arthropathy, unspecified: Secondary | ICD-10-CM | POA: Diagnosis not present

## 2011-09-16 DIAGNOSIS — R079 Chest pain, unspecified: Secondary | ICD-10-CM | POA: Diagnosis not present

## 2011-09-16 DIAGNOSIS — E860 Dehydration: Secondary | ICD-10-CM | POA: Diagnosis not present

## 2011-09-16 DIAGNOSIS — G319 Degenerative disease of nervous system, unspecified: Secondary | ICD-10-CM | POA: Diagnosis not present

## 2011-09-16 DIAGNOSIS — Z79899 Other long term (current) drug therapy: Secondary | ICD-10-CM | POA: Diagnosis not present

## 2011-09-16 DIAGNOSIS — R109 Unspecified abdominal pain: Secondary | ICD-10-CM | POA: Diagnosis not present

## 2011-09-16 DIAGNOSIS — D649 Anemia, unspecified: Secondary | ICD-10-CM | POA: Diagnosis not present

## 2011-09-16 DIAGNOSIS — Z7982 Long term (current) use of aspirin: Secondary | ICD-10-CM | POA: Diagnosis not present

## 2011-09-16 DIAGNOSIS — N170 Acute kidney failure with tubular necrosis: Secondary | ICD-10-CM | POA: Diagnosis not present

## 2011-09-16 DIAGNOSIS — R112 Nausea with vomiting, unspecified: Secondary | ICD-10-CM | POA: Diagnosis not present

## 2011-09-16 DIAGNOSIS — M1990 Unspecified osteoarthritis, unspecified site: Secondary | ICD-10-CM | POA: Diagnosis not present

## 2011-09-16 DIAGNOSIS — G40909 Epilepsy, unspecified, not intractable, without status epilepticus: Secondary | ICD-10-CM | POA: Diagnosis not present

## 2011-09-16 DIAGNOSIS — D490 Neoplasm of unspecified behavior of digestive system: Secondary | ICD-10-CM | POA: Diagnosis not present

## 2011-09-16 DIAGNOSIS — N179 Acute kidney failure, unspecified: Secondary | ICD-10-CM | POA: Diagnosis not present

## 2011-09-16 DIAGNOSIS — E871 Hypo-osmolality and hyponatremia: Secondary | ICD-10-CM | POA: Diagnosis not present

## 2011-09-16 DIAGNOSIS — E039 Hypothyroidism, unspecified: Secondary | ICD-10-CM | POA: Diagnosis not present

## 2011-09-16 DIAGNOSIS — Z951 Presence of aortocoronary bypass graft: Secondary | ICD-10-CM | POA: Diagnosis not present

## 2011-09-16 DIAGNOSIS — R1033 Periumbilical pain: Secondary | ICD-10-CM | POA: Diagnosis not present

## 2011-09-16 DIAGNOSIS — T148XXA Other injury of unspecified body region, initial encounter: Secondary | ICD-10-CM | POA: Diagnosis not present

## 2011-09-16 DIAGNOSIS — R011 Cardiac murmur, unspecified: Secondary | ICD-10-CM | POA: Diagnosis not present

## 2011-09-16 DIAGNOSIS — Z8739 Personal history of other diseases of the musculoskeletal system and connective tissue: Secondary | ICD-10-CM | POA: Diagnosis not present

## 2011-09-16 DIAGNOSIS — M542 Cervicalgia: Secondary | ICD-10-CM | POA: Diagnosis not present

## 2011-09-16 DIAGNOSIS — I621 Nontraumatic extradural hemorrhage: Secondary | ICD-10-CM | POA: Diagnosis not present

## 2011-09-16 DIAGNOSIS — E785 Hyperlipidemia, unspecified: Secondary | ICD-10-CM | POA: Diagnosis not present

## 2011-09-16 DIAGNOSIS — I10 Essential (primary) hypertension: Secondary | ICD-10-CM | POA: Diagnosis not present

## 2011-09-16 DIAGNOSIS — R1084 Generalized abdominal pain: Secondary | ICD-10-CM | POA: Diagnosis not present

## 2011-09-16 DIAGNOSIS — Z5189 Encounter for other specified aftercare: Secondary | ICD-10-CM | POA: Diagnosis not present

## 2011-09-16 DIAGNOSIS — I2510 Atherosclerotic heart disease of native coronary artery without angina pectoris: Secondary | ICD-10-CM | POA: Diagnosis not present

## 2011-09-16 DIAGNOSIS — I252 Old myocardial infarction: Secondary | ICD-10-CM | POA: Diagnosis not present

## 2011-09-16 DIAGNOSIS — S1093XA Contusion of unspecified part of neck, initial encounter: Secondary | ICD-10-CM | POA: Diagnosis not present

## 2011-09-16 DIAGNOSIS — Z853 Personal history of malignant neoplasm of breast: Secondary | ICD-10-CM | POA: Diagnosis not present

## 2011-09-16 DIAGNOSIS — I119 Hypertensive heart disease without heart failure: Secondary | ICD-10-CM | POA: Diagnosis not present

## 2011-09-16 DIAGNOSIS — S0990XA Unspecified injury of head, initial encounter: Secondary | ICD-10-CM | POA: Diagnosis not present

## 2011-09-16 LAB — URINE CULTURE
Colony Count: 35000
Culture  Setup Time: 201304150802

## 2011-09-16 MED ORDER — ACETAMINOPHEN 325 MG PO TABS: 650.0000 mg | ORAL_TABLET | Freq: Four times a day (QID) | ORAL | Status: DC | PRN

## 2011-09-16 MED ORDER — ADULT MULTIVITAMIN W/MINERALS CH: 1.0000 | ORAL_TABLET | Freq: Every day | ORAL | Status: DC

## 2011-09-16 MED ORDER — AMLODIPINE BESYLATE 10 MG PO TABS: 10.0000 mg | ORAL_TABLET | Freq: Every day | ORAL | Status: DC

## 2011-09-16 MED ORDER — DONEPEZIL HCL 5 MG PO TABS: 5.0000 mg | ORAL_TABLET | Freq: Every day | ORAL | Status: DC

## 2011-09-16 MED ORDER — ENSURE COMPLETE PO LIQD: 237.0000 mL | Freq: Two times a day (BID) | ORAL | Status: DC

## 2011-09-16 MED ORDER — TRAMADOL HCL 50 MG PO TABS: 50.0000 mg | ORAL_TABLET | Freq: Three times a day (TID) | ORAL | Status: DC | PRN

## 2011-09-16 NOTE — Discharge Summary (Signed)
Physician Discharge Summary  Patient ID: Natalie Bullock MRN: [id number] DOB/AGE: September 13, 1924 78 y.o.  Admit date: 09/12/2011 Discharge date: 09/16/2011  Primary Care Physician:  Burtis Junes, MD, MD   Discharge Diagnoses:   1-Delirium 2/2 dementia (Vascular/alzheimer type) 2-Dehydration 3-ARF 4-HTN (hypertension) 5-CAD (coronary artery disease) 6-Hyperlipidemia 7-Hypothyroidism 8-OA    Medication List  As of 09/16/2011  3:42 PM   STOP taking these medications         acetaminophen 650 MG CR tablet      HYDROcodone-acetaminophen 5-325 MG per tablet      ramipril 10 MG tablet         TAKE these medications         acetaminophen 325 MG tablet   Commonly known as: TYLENOL   Take 2 tablets (650 mg total) by mouth every 6 (six) hours as needed for pain or fever. For pain      amLODipine 10 MG tablet   Commonly known as: NORVASC   Take 1 tablet (10 mg total) by mouth daily.      aspirin 81 MG tablet   Take 162 mg by mouth daily.      donepezil 5 MG tablet   Commonly known as: ARICEPT   Take 1 tablet (5 mg total) by mouth at bedtime.      feeding supplement Liqd   Take 237 mLs by mouth 2 (two) times daily between meals.      levothyroxine 50 MCG tablet   Commonly known as: SYNTHROID, LEVOTHROID   Take 50 mcg by mouth daily before breakfast.      mulitivitamin with minerals Tabs   Take 1 tablet by mouth daily.      pravastatin 40 MG tablet   Commonly known as: PRAVACHOL   Take 40 mg by mouth every evening.      simethicone 80 MG chewable tablet   Commonly known as: MYLICON   Chew 80 mg by mouth daily as needed. For bloating      SYSTANE OP   Place 2 drops into both eyes as needed. For eye dryness.      traMADol 50 MG tablet   Commonly known as: ULTRAM   Take 1 tablet (50 mg total) by mouth every 8 (eight) hours as needed for pain (To be used for pain not relieved by tylenol).             Disposition and Follow-up:  Patient discharge in  stable and improved condition; at this point plan is for patient to return to SNF for further supportive care. Work up demonstrated that changes in her mental status and delirium are secondary to dementia (Vascular/dementia type). Patient was started on Aricept.   Consults:   Neurology   Significant Diagnostic Studies:  Ct Head Wo Contrast  09/13/2011  *RADIOLOGY REPORT*  Clinical Data: Delirium  CT HEAD WITHOUT CONTRAST  Technique:  Contiguous axial images were obtained from the base of the skull through the vertex without contrast.  Comparison: [facility] MRI brain dated 08/16/2011  Findings: No evidence of parenchymal hemorrhage or extra-axial fluid collection. No mass lesion, mass effect, or midline shift.  No CT evidence of acute infarction.  Mild subcortical white matter and periventricular small vessel ischemic changes.  Intracranial atherosclerosis.  Cerebral volume is age appropriate.  No ventriculomegaly.  The visualized paranasal sinuses are essentially clear. The mastoid air cells are unopacified.  No evidence of calvarial fracture.  IMPRESSION: No evidence of acute intracranial abnormality.  Mild small  vessel ischemic changes with intracranial atherosclerosis.  Original Report Authenticated By: Charline Bills, M.D.    Brief H and P: 78 y.o. female [facility] due to worsening confusion and agitation. She has not had a report of fevers chills nausea or vomiting or complaints of chest pain. Her ED workup was unremarkable. Patient admitted for further evaluation and treatment.   Hospital Course:  1-Delirium: CT negative and recent MRI also unremarkable except for small vessel disease. TSH, B12 level, RPR and ammonia WNL. PTH Normal and Urine cx/ CXR w/o any signs of infection. MMS 22-23/30. 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 Patient started on Aricept QHS and will be discharge to SNF for  further supportive care. Plan and findings discussed with family and HCPOA.   2-CAD (coronary artery disease):CE'z negative and currently w/o any chest pain. Will continue low dose aspirin and statins.   3-Hyperlipidemia: Will continue statins.   4-Hypothyroidism: continue synthroid. TSH WNL.  5-HTN (hypertension): After IVF's given, BP stable and well controlled. Will use amlodipine instead of ramipril for BP control.   6-ARF: Resolved with IVF's. Presumed to be associated with dehydration and the use of ramipril. Ramipril discontinue. Patient advised to keep herself hydrated.   7-Protein calorie malnutrition and hypoalbuminemia: will continue MV and ensure TID  8-OA: start tramadol to avoid heavy narcotics and mainly will recommend use of tylenol.   Time spent on Discharge: 40 minutes  Signed: Rasheen Schewe 09/16/2011, 3:42 PM

## 2011-09-16 NOTE — Progress Notes (Addendum)
CSW spoke with patients nephew, mark, he confirms that patient is from [facility] and that patient is to return there upon discharge. [facility] confirms that they can accept patient back upon discharge. Fl2 completed and placed on chart.  Anila Bojarski C. Zamire Whitehurst MSW, LCSW [phone] Patient cleared for discharge. Patient accepted back to [facility]. Packet copied and placed in chart. ptar called for transportation. Patients nephew, mark, informed of transfer.  Aija Scarfo C. Kiyan Burmester MSW, LCSW [phone]

## 2011-09-16 NOTE — Discharge Instructions (Signed)
Aspiration Precautions Aspiration is the inhaling of a liquid or object into the lungs. Things that can be inhaled into the lungs include:  Food.   Any type of liquid, such as drinks or saliva.   Stomach contents, such as vomit or stomach acid.  When these things go into the lungs, damage can occur. Serious complications can then result, such as:  A lung infection (pneumonia).   A collection of pus in the lungs (lung abscess).  CAUSES  A decreased level of awareness (consciousness) due to:   Traumatic brain injury or head injury.   Stroke.   Neurological disease.   Seizures.   Decreased or absent gag reflex (inability to cough).   Medical conditions that affect swallowing.   Conditions that affect the food pipe (esophagus) such as a narrowing of the esophagus (esophageal stricture).   Gastroesophageal reflux (GERD). This is also known as acid reflux.   Any type of surgery where you are put under general anesthesia or have sedation.   Drinking large amounts of alcohol.   Taking medication that causes drowsiness, confusion, or weakness.   Aging.   Dental problems.   Having a feeding tube.  SYMPTOMS When aspiration occurs, different signs and symptoms can occur, such as:  Coughing (if a person has a cough or gag reflex) after swallowing food or liquids.   Difficulty breathing. This can include things like:   Breathing rapidly.   Breathing very slowly.   Hearing "gurgling" lung sounds when a person breaths.   Coughing up phlegm (sputum) that is:   Yellow, tan, or green in color.   Has pieces of food in it.   Bad smelling.   A change in voice (hoarseness).   A change in skin color. The skin may turn a "bluish" type color because of a lack of oxygen (cyanosis).   Fever.  DIAGNOSIS  A chest X-ray may be performed. This takes a picture of your lungs. It can show changes in the lungs if aspiration has occurred.   A bronchoscopy may be performed. This  is a surgical procedure in which a thin, flexible tube with a camera at the end is inserted into the nose or mouth. The tube is advanced to the lungs so your caregiver can view the lungs and obtain a culture, tissue sample, or remove an aspirated object.   A swallowing evaluation study may be performed to evaluate:   A person's risk of aspiration.   How difficult it is for a person to swallow.   What types of foods are safe for a person to eat.  PREVENTION If you are a caregiver to someone who may aspirate, follow the directions below. If you are caring for someone who can eat and drink through their mouth:  Have them sit in an upright position when eating food or drinking fluids, such as:   Sitting up in a chair.   If sitting in a chair is not possible, position the person in bed so they are upright.   Remind the person to eat slowly and chew well.   Do not distract the person. This is especially important for people with thinking or memory (cognitive) problems.   Check the person's mouth for leftover food after eating.   Keep the person sitting upright for 30 to 45 minutes after eating.   Do not serve food or drink for at least 2 hours before bedtime.  If you are caring for someone with a feeding tube and he  or she cannot eat or drink through their mouth:  Keep the person in an upright position as much as possible.   Do not  lay the person flat if they are getting continuous feedings. Turn the feeding pump off if you need to lay the person flat for any reason.   Check feeding tube residuals as directed by your caregiver. If a large amount of tube feedings are pulled back (aspirated) from the feeding tube, call your caregiver right away.  General guidelines to prevent aspiration include:  Feed small amounts of food. Do not force feed.   Use as little water as possible when brushing the person's teeth or cleaning his or her mouth.   Provide oral care before and after meals.     Never put food or fluids in the mouth of a person who is not fully alert.   Crush pills and put them in soft food such as pudding or ice cream. Some pills should not be crushed. Check with your caregiver before crushing any medication.  SEEK IMMEDIATE MEDICAL CARE IF:   The person has trouble breathing or starts to breathe rapidly.   The person is breathing very slowly or stops breathing.   The person coughs a lot after eating or drinking.   The person has a chronic cough.   The person coughs up thick, yellow, or tan sputum.   The person has a fever or persistent symptoms for more than 72 hours.   The person has a fever and their symptoms suddenly get worse.

## 2011-09-19 DIAGNOSIS — I2510 Atherosclerotic heart disease of native coronary artery without angina pectoris: Secondary | ICD-10-CM | POA: Diagnosis not present

## 2011-09-19 DIAGNOSIS — E785 Hyperlipidemia, unspecified: Secondary | ICD-10-CM | POA: Diagnosis not present

## 2011-09-19 DIAGNOSIS — F0390 Unspecified dementia without behavioral disturbance: Secondary | ICD-10-CM | POA: Diagnosis not present

## 2011-09-19 DIAGNOSIS — M6281 Muscle weakness (generalized): Secondary | ICD-10-CM | POA: Diagnosis not present

## 2011-09-19 DIAGNOSIS — I10 Essential (primary) hypertension: Secondary | ICD-10-CM | POA: Diagnosis not present

## 2011-09-19 DIAGNOSIS — E039 Hypothyroidism, unspecified: Secondary | ICD-10-CM | POA: Diagnosis not present

## 2011-09-22 ENCOUNTER — Emergency Department (HOSPITAL_COMMUNITY): Payer: Medicare Other

## 2011-09-22 ENCOUNTER — Emergency Department (HOSPITAL_COMMUNITY)
Admission: EM | Admit: 2011-09-22 | Discharge: 2011-09-22 | Disposition: A | Discharge status: Home / Self Care | Payer: Medicare Other | Attending: Emergency Medicine | Admitting: Emergency Medicine

## 2011-09-22 ENCOUNTER — Encounter (HOSPITAL_COMMUNITY): Payer: Self-pay | Admitting: Emergency Medicine

## 2011-09-22 DIAGNOSIS — IMO0002 Reserved for concepts with insufficient information to code with codable children: Secondary | ICD-10-CM

## 2011-09-22 DIAGNOSIS — M542 Cervicalgia: Secondary | ICD-10-CM | POA: Diagnosis not present

## 2011-09-22 DIAGNOSIS — S0083XA Contusion of other part of head, initial encounter: Secondary | ICD-10-CM | POA: Insufficient documentation

## 2011-09-22 DIAGNOSIS — G319 Degenerative disease of nervous system, unspecified: Secondary | ICD-10-CM | POA: Insufficient documentation

## 2011-09-22 DIAGNOSIS — W19XXXA Unspecified fall, initial encounter: Secondary | ICD-10-CM | POA: Insufficient documentation

## 2011-09-22 DIAGNOSIS — S0180XA Unspecified open wound of other part of head, initial encounter: Secondary | ICD-10-CM | POA: Diagnosis not present

## 2011-09-22 DIAGNOSIS — I10 Essential (primary) hypertension: Secondary | ICD-10-CM | POA: Insufficient documentation

## 2011-09-22 DIAGNOSIS — S0003XA Contusion of scalp, initial encounter: Secondary | ICD-10-CM | POA: Insufficient documentation

## 2011-09-22 DIAGNOSIS — Z79899 Other long term (current) drug therapy: Secondary | ICD-10-CM | POA: Insufficient documentation

## 2011-09-22 DIAGNOSIS — E785 Hyperlipidemia, unspecified: Secondary | ICD-10-CM | POA: Insufficient documentation

## 2011-09-22 DIAGNOSIS — E039 Hypothyroidism, unspecified: Secondary | ICD-10-CM | POA: Insufficient documentation

## 2011-09-22 DIAGNOSIS — Y9301 Activity, walking, marching and hiking: Secondary | ICD-10-CM | POA: Insufficient documentation

## 2011-09-22 DIAGNOSIS — I2510 Atherosclerotic heart disease of native coronary artery without angina pectoris: Secondary | ICD-10-CM | POA: Insufficient documentation

## 2011-09-22 DIAGNOSIS — I252 Old myocardial infarction: Secondary | ICD-10-CM | POA: Insufficient documentation

## 2011-09-22 DIAGNOSIS — R079 Chest pain, unspecified: Secondary | ICD-10-CM | POA: Diagnosis not present

## 2011-09-22 LAB — URINALYSIS, ROUTINE W REFLEX MICROSCOPIC
Glucose, UA: NEGATIVE mg/dL
Nitrite: NEGATIVE
Protein, ur: 30 mg/dL — AB
pH: 7.5 (ref 5.0–8.0)

## 2011-09-22 LAB — URINE MICROSCOPIC-ADD ON

## 2011-09-22 MED ORDER — HYDROMORPHONE HCL PF 1 MG/ML IJ SOLN
1.0000 mg | Freq: Once | INTRAMUSCULAR | Status: AC
  Administered 2011-09-22: 1 mg via INTRAMUSCULAR
  Filled 2011-09-22: qty 1

## 2011-09-22 MED ORDER — FENTANYL CITRATE 0.05 MG/ML IJ SOLN
50.0000 ug | Freq: Once | INTRAMUSCULAR | Status: DC
  Filled 2011-09-22: qty 2

## 2011-09-22 NOTE — ED Notes (Signed)
Pt c/o head and neck pain after a fall that occurred earlier tonight. Pt states she was walking to bed from restroom when fell and hit her head. Pt has large hematoma on forehead as well as a small laceration in small area.

## 2011-09-22 NOTE — Discharge Instructions (Signed)
Fall Prevention, Elderly Falls are the leading cause of injuries, accidents, and accidental deaths in people over the age of 78. Falling is a real threat to your ability to live on your own. CAUSES   Poor eyesight or poor hearing can make you more likely to fall.   Illnesses and physical conditions can affect your strength and balance.   Poor lighting, throw rugs and pets in your home can make you more likely to trip or slip.   The side effects of some medicines can upset your balance and lead to falling. These include medicines for depression, sleep problems, high blood pressure, diabetes, and heart conditions.  PREVENTION  Be sure your home is as safe as possible. Here are some tips:  Wear shoes with non-skid soles (not house slippers).   Be sure your home and outside area are well lit.   Use night lights throughout your house, including hallways and stairways.   Remove clutter and clean up spills on floors and walkways.   Remove throw rugs or fasten them to the floor with carpet tape. Tack down carpet edges.   Do not place electrical cords across pathways.   Install grab bars in your bathtub, shower, and toilet area. Towel bars should not be used as a grab bar.   Install handrails on both sides of stairways.   Do not climb on stools or stepladders. Get someone else to help with jobs that require climbing.   Do not wax your floors at all, or use a non-skid wax.   Repair uneven or unsafe sidewalks, walkways or stairs.   Keep frequently used items within reach.   Be aware of pets so you do not trip.  Get regular check-ups from your doctor, and take good care of yourself:  Have your eyes checked every year for vision changes, cataracts, glaucoma, and other eye problems. Wear eyeglasses as directed.   Have your hearing checked every 2 years, or anytime you or others think that you cannot hear well. Use hearing aids as directed.   See your caregiver if you have foot pain or  corns. Sore feet can contribute to falls.   Let your caregiver know if a medicine is making you feel dizzy or making you lose your balance.   Use a cane, walker, or wheelchair as directed. Use walker or wheelchair brakes when getting in and out.   When you get up from bed, sit on the side of the bed for 1 to 2 minutes before you stand up. This will give your blood pressure time to adjust, and you will feel less dizzy.   If you need to go to the bathroom often, consider using a bedside commode.  Keep your body in good shape:  Get regular exercise, especially walking.   Do exercises to strengthen the muscles you use for walking and lifting.   Do not smoke.   Minimize use of alcohol.  SEEK IMMEDIATE MEDICAL CARE IF:   You feel dizzy, weak, or unsteady on your feet.   You feel confused.   You fall.  Document Released: 05/19/2005 Document Revised: 05/08/2011 Document Reviewed: 11/13/2006 [facility] Patient Information 2012 [place], [place].  Hematoma A hematoma is a pocket of blood that collects under the skin, in an organ, in a body space, in a joint space, or in other tissue. The blood can clot to form a lump that you can see and feel. The lump is often firm, sore, and sometimes even painful and tender. Most  hematomas get better in a few days to weeks. However, some hematomas may be serious and require medical care.Hematomas can range in size from very small to very large. CAUSES  A hematoma can be caused by a blunt or penetrating injury. It can also be caused by leakage from a blood vessel under the skin. Spontaneous leakage from a blood vessel is more likely to occur in elderly people, especially those taking blood thinners. Sometimes, a hematoma can develop after certain medical procedures. SYMPTOMS  Unlike a bruise, a hematoma forms a firm lump that you can feel. This lump is the collection of blood. The collection of blood can also cause your skin to turn a blue to dark blue color.  If the hematoma is close to the surface of the skin, it often produces a yellowish color in the skin. DIAGNOSIS  Your caregiver can determine whether you have a hematoma based on your history and a physical exam. TREATMENT  Hematomas usually go away on their own over time. Rarely does the blood need to be drained out of the body. HOME CARE INSTRUCTIONS   Put ice on the injured area.   Put ice in a plastic bag.   Place a towel between your skin and the bag.   Leave the ice on for 15 to 20 minutes, 3 to 4 times a day for the first 1 to 2 days.   After the first 2 days, switch to using warm compresses on the hematoma.   Elevate the injured area to help decrease pain and swelling. Wrapping the area with an elastic bandage may also be helpful. Compression helps to reduce swelling and promotes shrinking of the hematoma. Make sure the bandage is not wrapped too tight.   If your hematoma is on a lower extremity and is painful, crutches may be helpful for a couple days.   Only take over-the-counter or prescription medicines for pain, discomfort, or fever as directed by your caregiver. Most patients can take acetaminophen or ibuprofen for the pain.  SEEK IMMEDIATE MEDICAL CARE IF:   You have increasing pain, or your pain is not controlled with medicine.   You have a fever.   You have worsening swelling or discoloration.   Your skin over the hematoma breaks or starts bleeding.  MAKE SURE YOU:   Understand these instructions.   Will watch your condition.   Will get help right away if you are not doing well or get worse.  Document Released: 01/01/2004 Document Revised: 05/08/2011 Document Reviewed: 01/20/2011 [facility] Patient Information 2012 [place], [place].

## 2011-09-22 NOTE — ED Notes (Signed)
Per EMS: Pt comes from [facility] after a fall that occurred tonight. Staff states pt was walking back from restroom when she tripped and fell.  Pt struck head during fall and has hematoma on forehead.Pt denies LOC and recalls event. Pt c/o head and neck pain. Pt is A&O x4.

## 2011-09-22 NOTE — ED Notes (Signed)
[id number]<GN> Expected date:09/22/11<BR> Expected time:<BR> Means of arrival:<BR> Comments:<BR> EMS 211 GC - fall

## 2011-09-22 NOTE — ED Provider Notes (Signed)
History     CSN: [id number]  Arrival date & time 09/22/11  0113   First MD Initiated Contact with Patient 09/22/11 0119      Chief Complaint  Patient presents with  . Fall    (Consider location/radiation/quality/duration/timing/severity/associated sxs/prior treatment) Patient is a 78 y.o. female presenting with fall. The history is provided by the patient. No language interpreter was used.  Fall The accident occurred less than 1 hour ago. The fall occurred while walking. She fell from a height of 1 to 2 ft. She landed on carpet. The volume of blood lost was minimal. The point of impact was the head. The pain is present in the neck and head. The pain is moderate. She was not ambulatory at the scene. There was no entrapment after the fall. There was no drug use involved in the accident. There was no alcohol use involved in the accident. Associated symptoms include headaches. Pertinent negatives include no visual change, no fever, no numbness, no abdominal pain, no bowel incontinence, no nausea, no vomiting, no hematuria, no hearing loss, no loss of consciousness and no tingling. Treatment on scene includes a c-collar and a backboard. She has tried nothing for the symptoms.    Past Medical History  Diagnosis Date  . IHD (ischemic heart disease)     with CABG in August 2002  . Hypertension   . Hypothyroidism   . Vitamin d deficiency   . Hyperlipidemia   . Anemia   . Hx of CABG     pt states she had 5 bypasses  . Breast cancer 1975    Right sided mastectomy  . Coronary artery disease   . Myocardial infarction     Non Q-wave MI  . Arthritis     osteoarthritis  . H/O hypokalemia     Past Surgical History  Procedure Date  . Coronary artery bypass graft 12/2000    LEFT INTERNAL MAMMARY TO THE LAD, SAPHENOUS VEIN GRAFT TO THE FIRST DIAGONAL, SAPHENOUS VEIN GRAFT TO THE RAMUS INTERMEDIATE, AND A SEQUENTIAL SAPHENOUS VEIN GRAFT TO THE POSTERIOR DESCENDING AND POSTERIOR LATERAL  BRANCHES.  . Mastectomy 1975    RIGHT SIDE  . Cardiovascular stress test Aug 2010    Normal; EF 74%  . Cardiac catheterization 01/20/2001    NORMAL. EF 60-70%  . Tubal ligation     1960's  . Breast surgery     left breast cyst surgery   . Mass excision 04/08/2011    Procedure: EXCISION MASS;  Surgeon: Nicki Reaper, MD;  Location: [facility];  Service: Orthopedics;  Laterality: Left;  excision mass left palm    Family History  Problem Relation Age of Onset  . Alzheimer's disease Mother     History  Substance Use Topics  . Smoking status: Former Smoker -- 1.0 packs/day for 4 years    Types: Cigarettes    Quit date: 06/02/1969  . Smokeless tobacco: Never Used  . Alcohol Use: No    OB History    Grav Para Term Preterm Abortions TAB SAB Ect Mult Living                  Review of Systems  Constitutional: Negative for fever, activity change, appetite change and fatigue.  HENT: Positive for neck pain. Negative for congestion, sore throat, rhinorrhea and neck stiffness.   Respiratory: Negative for cough and shortness of breath.   Cardiovascular: Negative for chest pain and palpitations.  Gastrointestinal: Negative for nausea, vomiting,  abdominal pain and bowel incontinence.  Genitourinary: Negative for dysuria, urgency, frequency, hematuria and flank pain.  Musculoskeletal: Negative for myalgias, back pain and arthralgias.  Neurological: Positive for headaches. Negative for dizziness, tingling, loss of consciousness, weakness, light-headedness and numbness.  All other systems reviewed and are negative.    Allergies  Valium; Codeine; Crestor; Doxycycline; Lipitor; Sulfa drugs cross reactors; Tequin; Tylenol; Vytorin; and Zocor  Home Medications   Current Outpatient Rx  Name Route Sig Dispense Refill  . ACETAMINOPHEN 325 MG PO TABS Oral Take 2 tablets (650 mg total) by mouth every 6 (six) hours as needed for pain or fever. For pain 30 tablet   . AMLODIPINE  BESYLATE 10 MG PO TABS Oral Take 1 tablet (10 mg total) by mouth daily.    . ASPIRIN 81 MG PO TABS Oral Take 162 mg by mouth daily.     . DONEPEZIL HCL 5 MG PO TABS Oral Take 1 tablet (5 mg total) by mouth at bedtime.    Nolon Nations COMPLETE PO LIQD Oral Take 237 mLs by mouth 2 (two) times daily between meals.    Marland Kitchen LEVOTHYROXINE SODIUM 50 MCG PO TABS Oral Take 50 mcg by mouth daily before breakfast.     . ADULT MULTIVITAMIN W/MINERALS CH Oral Take 1 tablet by mouth daily.    Frazier Butt OP Both Eyes Place 2 drops into both eyes as needed. For eye dryness.    Marland Kitchen PRAVASTATIN SODIUM 40 MG PO TABS Oral Take 40 mg by mouth every evening.     Marland Kitchen SIMETHICONE 80 MG PO CHEW Oral Chew 80 mg by mouth daily as needed. For bloating    . TRAMADOL HCL 50 MG PO TABS Oral Take 1 tablet (50 mg total) by mouth every 8 (eight) hours as needed for pain (To be used for pain not relieved by tylenol). 30 tablet 0    BP 120/60  Pulse 65  Temp(Src) 99.2 F (37.3 C) (Oral)  Resp 14  SpO2 99%  Physical Exam  Nursing note and vitals reviewed. Constitutional: She is oriented to person, place, and time. She appears well-developed and well-nourished. No distress.  HENT:  Right Ear: External ear normal.  Left Ear: External ear normal.  Mouth/Throat: Oropharynx is clear and moist.       Hematoma in the central forehead. Minor abrasion/laceration to the forehead  Eyes: Conjunctivae and EOM are normal. Pupils are equal, round, and reactive to light.  Neck:       Neck pain on palpation. Arrived in cervical collar  Cardiovascular: Normal rate, regular rhythm, normal heart sounds and intact distal pulses.   Pulmonary/Chest: Effort normal and breath sounds normal. No respiratory distress. She exhibits no tenderness.  Abdominal: Soft. Bowel sounds are normal. There is no tenderness.  Musculoskeletal: Normal range of motion. She exhibits no edema and no tenderness.       Thoracic back: She exhibits no bony tenderness.        Lumbar back: She exhibits no bony tenderness.  Neurological: She is alert and oriented to person, place, and time. No cranial nerve deficit or sensory deficit.  Skin: Skin is warm and dry. No rash noted.    ED Course  Procedures (including critical care time)  LACERATION REPAIR Performed by: Dayton Bailiff Authorized by: Dayton Bailiff Consent: Verbal consent obtained. Risks and benefits: risks, benefits and alternatives were discussed Consent given by: patient Patient identity confirmed: provided demographic data Prepped and Draped in normal sterile fashion Wound explored  Laceration Location: forehead  Laceration Length: 1cm  No Foreign Bodies seen or palpated  Irrigation method: syringe Amount of cleaning: standard  Skin closure: dermabond and steristrips  Number of sutures: 0  Technique: dermabond and steristrips  Patient tolerance: Patient tolerated the procedure well with no immediate complications.  Labs Reviewed  URINALYSIS, ROUTINE W REFLEX MICROSCOPIC - Abnormal; Notable for the following:    APPearance TURBID (*)    Protein, ur 30 (*)    Leukocytes, UA TRACE (*)    All other components within normal limits  URINE MICROSCOPIC-ADD ON   Dg Chest 2 View  09/22/2011  *RADIOLOGY REPORT*  Clinical Data: Pain after fall.  CHEST - 2 VIEW  Comparison: 09/11/2011  Findings: Stable appearance of postoperative changes in the mediastinum.  Emphysematous changes and scattered fibrosis in the lungs.  Normal heart size and pulmonary vascularity.  No focal airspace consolidation in the lungs.  No pneumothorax.  No blunting of costophrenic angles.  Degenerative changes in the thoracic spine.  Mild anterior compression of a mid thoracic vertebra which appears stable.  Calcification of the aorta.  IMPRESSION: No evidence of active pulmonary disease.  Emphysematous changes and scattered fibrosis in the lungs.  Original Report Authenticated By: Marlon Pel, M.D.   Ct Head Wo  Contrast  09/22/2011  *RADIOLOGY REPORT*  Clinical Data: Laceration and hematoma to the forehead.  Fall. History of breast cancer.  CT HEAD WITHOUT CONTRAST  Technique:  Contiguous axial images were obtained from the base of the skull through the vertex without contrast.  Comparison: MRI brain 08/16/2011.  CT head 08/14/2011  Findings: Diffuse cerebral atrophy.  Ventricular dilatation consistent with central atrophy.  No mass effect or midline shift. No abnormal extra-axial fluid collections.  Gray-white matter junctions are distinct.  Basal cisterns are not effaced.  No evidence of acute intracranial hemorrhage.  Vascular calcifications.  Visualized paranasal sinuses and mastoid air cells are not opacified.  There is a large subcutaneous scalp hematoma over the anterior frontal region with subcutaneous emphysema present.  No underlying skull fractures.  Intracranial contents are unchanged since the previous study.  IMPRESSION: Large anterior frontal subcutaneous scalp hematoma with subcutaneous gas.  No underlying skull fractures.  No acute intracranial abnormalities.  Original Report Authenticated By: Marlon Pel, M.D.   Ct Cervical Spine Wo Contrast  09/22/2011  *RADIOLOGY REPORT*  Clinical Data: Laceration and hematoma to the forehead.  Fall.  C- collar.  CT CERVICAL SPINE WITHOUT CONTRAST  Technique:  Multidetector CT imaging of the cervical spine was performed. Multiplanar CT image reconstructions were also generated.  Comparison: 08/14/2011  Findings: Normal alignment of the cervical vertebrae and facet joints.  Degenerative changes in the cervical spine with narrowed interspaces and associated endplate hypertrophic changes from C3- C7.  Degenerative changes in the facet joints and at C1-2.  Lateral masses of C1 appear symmetrical.  The odontoid process is intact. No vertebral compression deformities.  No prevertebral soft tissue swelling.  No paraspinal soft tissue infiltration.  Ligamentous  calcification over the C5 spinous process.  No focal bone lesion or bone destruction.  Bone cortex and trabecular architecture appear intact.  Vascular calcifications. No significant change since previous study.  IMPRESSION: Degenerative changes in the cervical spine.  No displaced fractures identified.  Original Report Authenticated By: Marlon Pel, M.D.     1. Fall   2. Hematoma of frontal scalp   3. Laceration       MDM  No evidence of  injury seen on imaging. She'll be discharged back to [facility]. Fall precautions provided. Her laceration/skin tear was repaired using Dermabond and Steri-Strips. Struck to followup with her primary care physician. No evidence of urinary tract infection. Provided strict return precautions        Dayton Bailiff, MD 09/22/11 [phone]

## 2011-09-27 ENCOUNTER — Emergency Department (HOSPITAL_COMMUNITY)
Admission: EM | Admit: 2011-09-27 | Discharge: 2011-09-28 | Disposition: A | Discharge status: Home / Self Care | Payer: Medicare Other | Attending: Emergency Medicine | Admitting: Emergency Medicine

## 2011-09-27 ENCOUNTER — Encounter (HOSPITAL_COMMUNITY): Payer: Self-pay | Admitting: Emergency Medicine

## 2011-09-27 ENCOUNTER — Emergency Department (HOSPITAL_COMMUNITY): Payer: Medicare Other

## 2011-09-27 DIAGNOSIS — R109 Unspecified abdominal pain: Secondary | ICD-10-CM | POA: Diagnosis not present

## 2011-09-27 DIAGNOSIS — R011 Cardiac murmur, unspecified: Secondary | ICD-10-CM | POA: Insufficient documentation

## 2011-09-27 DIAGNOSIS — Z951 Presence of aortocoronary bypass graft: Secondary | ICD-10-CM | POA: Diagnosis not present

## 2011-09-27 DIAGNOSIS — R1084 Generalized abdominal pain: Secondary | ICD-10-CM | POA: Diagnosis not present

## 2011-09-27 DIAGNOSIS — R112 Nausea with vomiting, unspecified: Secondary | ICD-10-CM | POA: Insufficient documentation

## 2011-09-27 DIAGNOSIS — E785 Hyperlipidemia, unspecified: Secondary | ICD-10-CM | POA: Insufficient documentation

## 2011-09-27 DIAGNOSIS — D259 Leiomyoma of uterus, unspecified: Secondary | ICD-10-CM | POA: Diagnosis not present

## 2011-09-27 DIAGNOSIS — Z79899 Other long term (current) drug therapy: Secondary | ICD-10-CM | POA: Insufficient documentation

## 2011-09-27 DIAGNOSIS — E039 Hypothyroidism, unspecified: Secondary | ICD-10-CM | POA: Insufficient documentation

## 2011-09-27 DIAGNOSIS — Z853 Personal history of malignant neoplasm of breast: Secondary | ICD-10-CM | POA: Insufficient documentation

## 2011-09-27 DIAGNOSIS — R1033 Periumbilical pain: Secondary | ICD-10-CM | POA: Insufficient documentation

## 2011-09-27 DIAGNOSIS — I252 Old myocardial infarction: Secondary | ICD-10-CM | POA: Insufficient documentation

## 2011-09-27 DIAGNOSIS — Z7982 Long term (current) use of aspirin: Secondary | ICD-10-CM | POA: Insufficient documentation

## 2011-09-27 DIAGNOSIS — I10 Essential (primary) hypertension: Secondary | ICD-10-CM | POA: Insufficient documentation

## 2011-09-27 DIAGNOSIS — M129 Arthropathy, unspecified: Secondary | ICD-10-CM | POA: Insufficient documentation

## 2011-09-27 DIAGNOSIS — I2510 Atherosclerotic heart disease of native coronary artery without angina pectoris: Secondary | ICD-10-CM | POA: Insufficient documentation

## 2011-09-27 LAB — URINALYSIS, ROUTINE W REFLEX MICROSCOPIC
Bilirubin Urine: NEGATIVE
Ketones, ur: NEGATIVE mg/dL
Nitrite: NEGATIVE
Protein, ur: NEGATIVE mg/dL
Urobilinogen, UA: 0.2 mg/dL (ref 0.0–1.0)

## 2011-09-27 LAB — COMPREHENSIVE METABOLIC PANEL
ALT: 19 U/L (ref 0–35)
Alkaline Phosphatase: 57 U/L (ref 39–117)
BUN: 13 mg/dL (ref 6–23)
CO2: 28 mEq/L (ref 19–32)
Calcium: 10.4 mg/dL (ref 8.4–10.5)
GFR calc Af Amer: 90 mL/min — ABNORMAL LOW (ref >90)
GFR calc non Af Amer: 77 mL/min — ABNORMAL LOW (ref >90)
Glucose, Bld: 103 mg/dL — ABNORMAL HIGH (ref 70–99)
Potassium: 3.8 mEq/L (ref 3.5–5.1)
Sodium: 136 mEq/L (ref 135–145)

## 2011-09-27 LAB — CBC
Hemoglobin: 11.3 g/dL — ABNORMAL LOW (ref 12.0–15.0)
MCH: 27.7 pg (ref 26.0–34.0)
MCV: 84.3 fL (ref 78.0–100.0)
Platelets: 269 10*3/uL (ref 150–400)
RBC: 4.08 MIL/uL (ref 3.87–5.11)
WBC: 7.2 10*3/uL (ref 4.0–10.5)

## 2011-09-27 LAB — DIFFERENTIAL
Eosinophils Relative: 2 % (ref 0–5)
Lymphocytes Relative: 12 % (ref 12–46)
Lymphs Abs: 0.8 10*3/uL (ref 0.7–4.0)
Monocytes Relative: 5 % (ref 3–12)

## 2011-09-27 MED ORDER — SODIUM CHLORIDE 0.9 % IV BOLUS (SEPSIS)
500.0000 mL | Freq: Once | INTRAVENOUS | Status: AC
  Administered 2011-09-27: 500 mL via INTRAVENOUS

## 2011-09-27 NOTE — ED Notes (Signed)
Patient transported to X-ray 

## 2011-09-27 NOTE — ED Provider Notes (Signed)
History     CSN: [id number]  Arrival date & time 09/27/11  2056   First MD Initiated Contact with Patient 09/27/11 2133      Chief Complaint  Patient presents with  . Abdominal Pain   HPI This elderly female presents from her extended care facility with concerns over intermittent generalized abdominal pain.  She states that prior to lunch she was in her usual state of health.  Following lunch she developed mid abdominal discomfort and mild nausea.  She had a single episode of emesis.  She denies any diarrhea.  The pain improved with Mylanta.  This cycle recurred 3 times.  After the pain returned again she was referred to this facility for evaluation.  On arrival the patient notes that she is in mild discomfort, described as sore and unsettled across the mid abdomen without any other radiation.  She has no current nausea, fevers, chills, lightheadedness, chest pain, dyspnea.   Past Medical History  Diagnosis Date  . IHD (ischemic heart disease)     with CABG in August 2002  . Hypertension   . Hypothyroidism   . Vitamin d deficiency   . Hyperlipidemia   . Anemia   . Hx of CABG     pt states she had 5 bypasses  . Breast cancer 1975    Right sided mastectomy  . Coronary artery disease   . Myocardial infarction     Non Q-wave MI  . Arthritis     osteoarthritis  . H/O hypokalemia     Past Surgical History  Procedure Date  . Coronary artery bypass graft 12/2000    LEFT INTERNAL MAMMARY TO THE LAD, SAPHENOUS VEIN GRAFT TO THE FIRST DIAGONAL, SAPHENOUS VEIN GRAFT TO THE RAMUS INTERMEDIATE, AND A SEQUENTIAL SAPHENOUS VEIN GRAFT TO THE POSTERIOR DESCENDING AND POSTERIOR LATERAL BRANCHES.  . Mastectomy 1975    RIGHT SIDE  . Cardiovascular stress test Aug 2010    Normal; EF 74%  . Cardiac catheterization 01/20/2001    NORMAL. EF 60-70%  . Tubal ligation     1960's  . Breast surgery     left breast cyst surgery   . Mass excision 04/08/2011    Procedure: EXCISION MASS;  Surgeon:  Nicki Reaper, MD;  Location: [name];  Service: Orthopedics;  Laterality: Left;  excision mass left palm    Family History  Problem Relation Age of Onset  . Alzheimer's disease Mother     History  Substance Use Topics  . Smoking status: Former Smoker -- 1.0 packs/day for 4 years    Types: Cigarettes    Quit date: 06/02/1969  . Smokeless tobacco: Never Used  . Alcohol Use: No    OB History    Grav Para Term Preterm Abortions TAB SAB Ect Mult Living                  Review of Systems  Constitutional:       HPI  HENT:       HPI otherwise negative  Eyes: Negative.   Respiratory:       HPI, otherwise negative  Cardiovascular:       HPI, otherwise nmegative  Gastrointestinal: Positive for vomiting. Negative for diarrhea.       Last bowel movement this morning  Genitourinary:       HPI, otherwise negative  Musculoskeletal:       HPI, otherwise negative  Skin: Negative.   Neurological: Negative for syncope.  Allergies  Valium; Codeine; Crestor; Doxycycline; Lipitor; Sulfa drugs cross reactors; Tequin; Tylenol; Vytorin; and Zocor  Home Medications   Current Outpatient Rx  Name Route Sig Dispense Refill  . ACETAMINOPHEN 325 MG PO TABS Oral Take 2 tablets (650 mg total) by mouth every 6 (six) hours as needed for pain or fever. For pain 30 tablet   . AMLODIPINE BESYLATE 10 MG PO TABS Oral Take 1 tablet (10 mg total) by mouth daily.    . ASPIRIN 81 MG PO TABS Oral Take 162 mg by mouth daily.     . DONEPEZIL HCL 5 MG PO TABS Oral Take 1 tablet (5 mg total) by mouth at bedtime.    Nolon Nations COMPLETE PO LIQD Oral Take 237 mLs by mouth 2 (two) times daily between meals.    Marland Kitchen LEVOTHYROXINE SODIUM 50 MCG PO TABS Oral Take 50 mcg by mouth daily before breakfast.     . ADULT MULTIVITAMIN W/MINERALS CH Oral Take 1 tablet by mouth daily.    Frazier Butt OP Both Eyes Place 2 drops into both eyes as needed. For eye dryness.    Marland Kitchen PRAVASTATIN SODIUM 40 MG PO TABS Oral  Take 40 mg by mouth every evening.     Marland Kitchen PROMETHAZINE HCL 25 MG PO TABS Oral Take 25 mg by mouth every 6 (six) hours as needed. For nausea    . SIMETHICONE 80 MG PO CHEW Oral Chew 80 mg by mouth daily as needed. For bloating    . TRAMADOL HCL 50 MG PO TABS Oral Take 50 mg by mouth every 8 (eight) hours as needed. For pain      BP 140/65  Pulse 80  Temp(Src) 98 F (36.7 C) (Oral)  Resp 16  Wt 130 lb (58.968 kg)  SpO2 99%  Physical Exam  Nursing note and vitals reviewed. Constitutional: She is oriented to person, place, and time. She appears well-developed and well-nourished. No distress.  HENT:  Head: Normocephalic and atraumatic.  Eyes: Conjunctivae and EOM are normal.  Cardiovascular: Normal rate and regular rhythm.   Murmur heard. Pulmonary/Chest: Effort normal and breath sounds normal. No stridor. No respiratory distress.  Abdominal: Soft. Normal appearance. She exhibits no distension. There is no hepatosplenomegaly. There is tenderness in the periumbilical area. There is no rigidity, no rebound, no guarding and no CVA tenderness. No hernia.       Ticklish with palpation about the abdomen.  Non-peritoneal  Musculoskeletal: She exhibits no edema.  Neurological: She is alert and oriented to person, place, and time. No cranial nerve deficit.  Skin: Skin is warm and dry.  Psychiatric: She has a normal mood and affect.    ED Course  Procedures (including critical care time)  Labs Reviewed  CBC - Abnormal; Notable for the following:    Hemoglobin 11.3 (*)    HCT 34.4 (*)    All other components within normal limits  DIFFERENTIAL - Abnormal; Notable for the following:    Neutrophils Relative 81 (*)    All other components within normal limits  COMPREHENSIVE METABOLIC PANEL - Abnormal; Notable for the following:    Glucose, Bld 103 (*)    Total Bilirubin 0.2 (*)    GFR calc non Af Amer 77 (*)    GFR calc Af Amer 90 (*)    All other components within normal limits  LIPASE,  BLOOD  URINALYSIS, ROUTINE W REFLEX MICROSCOPIC   Dg Abd Acute W/chest  09/27/2011  *RADIOLOGY REPORT*  Clinical Data: Lower  abdominal pain.  ACUTE ABDOMEN SERIES (ABDOMEN 2 VIEW & CHEST 1 VIEW)  Comparison: 08/04/2011 CT  Findings: Cardiomegaly.  Status post median sternotomy and CABG. Aortic arch atherosclerosis.  Mild lung base scarring versus atelectasis is similar to prior.  No acute osseous abnormality.  No pleural effusion or pneumothorax.  Bowel gas pattern is nonobstructive, with air noted within loops of large and small bowel.  No free intraperitoneal air visualized on the decubitus view.  Calcified density within the right pelvis is most in keeping with calcified fibroids.  No acute osseous abnormality identified. Bilateral hip DJD.  IMPRESSION: Status post median sternotomy and CABG.  No acute cardiopulmonary process identified.  Nonobstructive bowel gas pattern.  Fibroid uterus.  Original Report Authenticated By: Waneta Martins, M.D.     No diagnosis found.  Cardiac monitor 80 sinus rhythm normal Pulse oximetry 99% room air normal   MDM  This elderly female presents with vague abdominal pain.  The patient's initial description is suggestive of postprandial reaction.  The denial of this significant complaints, acute pain, the absence of notable findings on physical exam, the unremarkable vital signs are all reassuring.  The patient's labs are unremarkable.  The patient was discharged to her nursing home with return precautions.      Gerhard Munch, MD 09/28/11 [id number]

## 2011-09-27 NOTE — ED Notes (Signed)
[id number]<AV> Expected date:<BR> Expected time: 8:57 PM<BR> Means of arrival:Ambulance<BR> Comments:<BR> M212 -- Abdominal Pain

## 2011-09-27 NOTE — ED Notes (Signed)
RN to obtain labs with the start of IV °

## 2011-09-27 NOTE — ED Notes (Signed)
Pt alert, presents from local ECF, [facility], brought by EMS, c/o gen abd pain, onset was today, denies n/v, denies changes in bowel or bladder, though c/o difficult BM today, resp even unlabored, skin pwd

## 2011-09-28 NOTE — Discharge Instructions (Signed)
A specific cause of your abdominal pain was not demonstrated to today's emergency department evaluation.  Please make sure to return for any concerning changes in your condition.

## 2011-10-17 DIAGNOSIS — D649 Anemia, unspecified: Secondary | ICD-10-CM | POA: Diagnosis not present

## 2011-10-17 DIAGNOSIS — I10 Essential (primary) hypertension: Secondary | ICD-10-CM | POA: Diagnosis not present

## 2011-10-17 DIAGNOSIS — E785 Hyperlipidemia, unspecified: Secondary | ICD-10-CM | POA: Diagnosis not present

## 2011-10-17 DIAGNOSIS — Z8739 Personal history of other diseases of the musculoskeletal system and connective tissue: Secondary | ICD-10-CM | POA: Diagnosis not present

## 2011-10-28 DIAGNOSIS — E785 Hyperlipidemia, unspecified: Secondary | ICD-10-CM | POA: Diagnosis not present

## 2011-10-28 DIAGNOSIS — I10 Essential (primary) hypertension: Secondary | ICD-10-CM | POA: Diagnosis not present

## 2011-10-28 DIAGNOSIS — Z8739 Personal history of other diseases of the musculoskeletal system and connective tissue: Secondary | ICD-10-CM | POA: Diagnosis not present

## 2011-10-28 DIAGNOSIS — R42 Dizziness and giddiness: Secondary | ICD-10-CM | POA: Diagnosis not present

## 2011-10-28 DIAGNOSIS — D649 Anemia, unspecified: Secondary | ICD-10-CM | POA: Diagnosis not present

## 2011-10-28 DIAGNOSIS — I2510 Atherosclerotic heart disease of native coronary artery without angina pectoris: Secondary | ICD-10-CM | POA: Diagnosis not present

## 2011-10-31 DIAGNOSIS — I10 Essential (primary) hypertension: Secondary | ICD-10-CM | POA: Diagnosis not present

## 2011-10-31 DIAGNOSIS — Z853 Personal history of malignant neoplasm of breast: Secondary | ICD-10-CM | POA: Diagnosis not present

## 2011-10-31 DIAGNOSIS — Z9181 History of falling: Secondary | ICD-10-CM | POA: Diagnosis not present

## 2011-10-31 DIAGNOSIS — F0390 Unspecified dementia without behavioral disturbance: Secondary | ICD-10-CM | POA: Diagnosis not present

## 2011-10-31 DIAGNOSIS — M1990 Unspecified osteoarthritis, unspecified site: Secondary | ICD-10-CM | POA: Diagnosis not present

## 2011-10-31 DIAGNOSIS — R262 Difficulty in walking, not elsewhere classified: Secondary | ICD-10-CM | POA: Diagnosis not present

## 2011-10-31 DIAGNOSIS — E785 Hyperlipidemia, unspecified: Secondary | ICD-10-CM | POA: Diagnosis not present

## 2011-10-31 DIAGNOSIS — M6281 Muscle weakness (generalized): Secondary | ICD-10-CM | POA: Diagnosis not present

## 2011-10-31 DIAGNOSIS — I2510 Atherosclerotic heart disease of native coronary artery without angina pectoris: Secondary | ICD-10-CM | POA: Diagnosis not present

## 2011-11-04 DIAGNOSIS — M6281 Muscle weakness (generalized): Secondary | ICD-10-CM | POA: Diagnosis not present

## 2011-11-04 DIAGNOSIS — I10 Essential (primary) hypertension: Secondary | ICD-10-CM | POA: Diagnosis not present

## 2011-11-04 DIAGNOSIS — R262 Difficulty in walking, not elsewhere classified: Secondary | ICD-10-CM | POA: Diagnosis not present

## 2011-11-04 DIAGNOSIS — M1990 Unspecified osteoarthritis, unspecified site: Secondary | ICD-10-CM | POA: Diagnosis not present

## 2011-11-04 DIAGNOSIS — I2510 Atherosclerotic heart disease of native coronary artery without angina pectoris: Secondary | ICD-10-CM | POA: Diagnosis not present

## 2011-11-04 DIAGNOSIS — F0390 Unspecified dementia without behavioral disturbance: Secondary | ICD-10-CM | POA: Diagnosis not present

## 2011-11-05 DIAGNOSIS — I2510 Atherosclerotic heart disease of native coronary artery without angina pectoris: Secondary | ICD-10-CM | POA: Diagnosis not present

## 2011-11-05 DIAGNOSIS — M1990 Unspecified osteoarthritis, unspecified site: Secondary | ICD-10-CM | POA: Diagnosis not present

## 2011-11-05 DIAGNOSIS — M6281 Muscle weakness (generalized): Secondary | ICD-10-CM | POA: Diagnosis not present

## 2011-11-05 DIAGNOSIS — I10 Essential (primary) hypertension: Secondary | ICD-10-CM | POA: Diagnosis not present

## 2011-11-05 DIAGNOSIS — F0390 Unspecified dementia without behavioral disturbance: Secondary | ICD-10-CM | POA: Diagnosis not present

## 2011-11-05 DIAGNOSIS — R262 Difficulty in walking, not elsewhere classified: Secondary | ICD-10-CM | POA: Diagnosis not present

## 2011-11-10 DIAGNOSIS — I2510 Atherosclerotic heart disease of native coronary artery without angina pectoris: Secondary | ICD-10-CM | POA: Diagnosis not present

## 2011-11-10 DIAGNOSIS — I10 Essential (primary) hypertension: Secondary | ICD-10-CM | POA: Diagnosis not present

## 2011-11-10 DIAGNOSIS — F0390 Unspecified dementia without behavioral disturbance: Secondary | ICD-10-CM | POA: Diagnosis not present

## 2011-11-10 DIAGNOSIS — M1990 Unspecified osteoarthritis, unspecified site: Secondary | ICD-10-CM | POA: Diagnosis not present

## 2011-11-10 DIAGNOSIS — M6281 Muscle weakness (generalized): Secondary | ICD-10-CM | POA: Diagnosis not present

## 2011-11-10 DIAGNOSIS — R262 Difficulty in walking, not elsewhere classified: Secondary | ICD-10-CM | POA: Diagnosis not present

## 2011-11-11 DIAGNOSIS — K2990 Gastroduodenitis, unspecified, without bleeding: Secondary | ICD-10-CM | POA: Diagnosis not present

## 2011-11-11 DIAGNOSIS — I10 Essential (primary) hypertension: Secondary | ICD-10-CM | POA: Diagnosis not present

## 2011-11-11 DIAGNOSIS — R634 Abnormal weight loss: Secondary | ICD-10-CM | POA: Diagnosis not present

## 2011-11-11 DIAGNOSIS — E039 Hypothyroidism, unspecified: Secondary | ICD-10-CM | POA: Diagnosis not present

## 2011-11-11 DIAGNOSIS — E7800 Pure hypercholesterolemia, unspecified: Secondary | ICD-10-CM | POA: Diagnosis not present

## 2011-11-12 DIAGNOSIS — M6281 Muscle weakness (generalized): Secondary | ICD-10-CM | POA: Diagnosis not present

## 2011-11-12 DIAGNOSIS — I10 Essential (primary) hypertension: Secondary | ICD-10-CM | POA: Diagnosis not present

## 2011-11-12 DIAGNOSIS — F0390 Unspecified dementia without behavioral disturbance: Secondary | ICD-10-CM | POA: Diagnosis not present

## 2011-11-12 DIAGNOSIS — I2510 Atherosclerotic heart disease of native coronary artery without angina pectoris: Secondary | ICD-10-CM | POA: Diagnosis not present

## 2011-11-12 DIAGNOSIS — R262 Difficulty in walking, not elsewhere classified: Secondary | ICD-10-CM | POA: Diagnosis not present

## 2011-11-12 DIAGNOSIS — M1990 Unspecified osteoarthritis, unspecified site: Secondary | ICD-10-CM | POA: Diagnosis not present

## 2011-11-13 DIAGNOSIS — M6281 Muscle weakness (generalized): Secondary | ICD-10-CM | POA: Diagnosis not present

## 2011-11-13 DIAGNOSIS — R262 Difficulty in walking, not elsewhere classified: Secondary | ICD-10-CM | POA: Diagnosis not present

## 2011-11-13 DIAGNOSIS — R404 Transient alteration of awareness: Secondary | ICD-10-CM | POA: Diagnosis not present

## 2011-11-13 DIAGNOSIS — I2510 Atherosclerotic heart disease of native coronary artery without angina pectoris: Secondary | ICD-10-CM | POA: Diagnosis not present

## 2011-11-13 DIAGNOSIS — I10 Essential (primary) hypertension: Secondary | ICD-10-CM | POA: Diagnosis not present

## 2011-11-13 DIAGNOSIS — F0390 Unspecified dementia without behavioral disturbance: Secondary | ICD-10-CM | POA: Diagnosis not present

## 2011-11-13 DIAGNOSIS — M1990 Unspecified osteoarthritis, unspecified site: Secondary | ICD-10-CM | POA: Diagnosis not present

## 2011-11-14 DIAGNOSIS — R262 Difficulty in walking, not elsewhere classified: Secondary | ICD-10-CM | POA: Diagnosis not present

## 2011-11-14 DIAGNOSIS — F0390 Unspecified dementia without behavioral disturbance: Secondary | ICD-10-CM | POA: Diagnosis not present

## 2011-11-14 DIAGNOSIS — M6281 Muscle weakness (generalized): Secondary | ICD-10-CM | POA: Diagnosis not present

## 2011-11-14 DIAGNOSIS — I10 Essential (primary) hypertension: Secondary | ICD-10-CM | POA: Diagnosis not present

## 2011-11-14 DIAGNOSIS — M1990 Unspecified osteoarthritis, unspecified site: Secondary | ICD-10-CM | POA: Diagnosis not present

## 2011-11-14 DIAGNOSIS — I2510 Atherosclerotic heart disease of native coronary artery without angina pectoris: Secondary | ICD-10-CM | POA: Diagnosis not present

## 2011-11-17 DIAGNOSIS — F0390 Unspecified dementia without behavioral disturbance: Secondary | ICD-10-CM | POA: Diagnosis not present

## 2011-11-17 DIAGNOSIS — I2510 Atherosclerotic heart disease of native coronary artery without angina pectoris: Secondary | ICD-10-CM | POA: Diagnosis not present

## 2011-11-17 DIAGNOSIS — M1990 Unspecified osteoarthritis, unspecified site: Secondary | ICD-10-CM | POA: Diagnosis not present

## 2011-11-17 DIAGNOSIS — I10 Essential (primary) hypertension: Secondary | ICD-10-CM | POA: Diagnosis not present

## 2011-11-17 DIAGNOSIS — R262 Difficulty in walking, not elsewhere classified: Secondary | ICD-10-CM | POA: Diagnosis not present

## 2011-11-17 DIAGNOSIS — M6281 Muscle weakness (generalized): Secondary | ICD-10-CM | POA: Diagnosis not present

## 2011-11-19 DIAGNOSIS — M1990 Unspecified osteoarthritis, unspecified site: Secondary | ICD-10-CM | POA: Diagnosis not present

## 2011-11-19 DIAGNOSIS — I2510 Atherosclerotic heart disease of native coronary artery without angina pectoris: Secondary | ICD-10-CM | POA: Diagnosis not present

## 2011-11-19 DIAGNOSIS — I10 Essential (primary) hypertension: Secondary | ICD-10-CM | POA: Diagnosis not present

## 2011-11-19 DIAGNOSIS — M6281 Muscle weakness (generalized): Secondary | ICD-10-CM | POA: Diagnosis not present

## 2011-11-19 DIAGNOSIS — R262 Difficulty in walking, not elsewhere classified: Secondary | ICD-10-CM | POA: Diagnosis not present

## 2011-11-19 DIAGNOSIS — F0390 Unspecified dementia without behavioral disturbance: Secondary | ICD-10-CM | POA: Diagnosis not present

## 2011-11-20 DIAGNOSIS — I10 Essential (primary) hypertension: Secondary | ICD-10-CM | POA: Diagnosis not present

## 2011-11-20 DIAGNOSIS — R262 Difficulty in walking, not elsewhere classified: Secondary | ICD-10-CM | POA: Diagnosis not present

## 2011-11-20 DIAGNOSIS — F0390 Unspecified dementia without behavioral disturbance: Secondary | ICD-10-CM | POA: Diagnosis not present

## 2011-11-20 DIAGNOSIS — M6281 Muscle weakness (generalized): Secondary | ICD-10-CM | POA: Diagnosis not present

## 2011-11-20 DIAGNOSIS — I2510 Atherosclerotic heart disease of native coronary artery without angina pectoris: Secondary | ICD-10-CM | POA: Diagnosis not present

## 2011-11-20 DIAGNOSIS — M1990 Unspecified osteoarthritis, unspecified site: Secondary | ICD-10-CM | POA: Diagnosis not present

## 2011-11-21 DIAGNOSIS — M6281 Muscle weakness (generalized): Secondary | ICD-10-CM | POA: Diagnosis not present

## 2011-11-21 DIAGNOSIS — F0390 Unspecified dementia without behavioral disturbance: Secondary | ICD-10-CM | POA: Diagnosis not present

## 2011-11-21 DIAGNOSIS — I10 Essential (primary) hypertension: Secondary | ICD-10-CM | POA: Diagnosis not present

## 2011-11-21 DIAGNOSIS — I2510 Atherosclerotic heart disease of native coronary artery without angina pectoris: Secondary | ICD-10-CM | POA: Diagnosis not present

## 2011-11-21 DIAGNOSIS — M1990 Unspecified osteoarthritis, unspecified site: Secondary | ICD-10-CM | POA: Diagnosis not present

## 2011-11-21 DIAGNOSIS — R262 Difficulty in walking, not elsewhere classified: Secondary | ICD-10-CM | POA: Diagnosis not present

## 2011-11-24 DIAGNOSIS — R262 Difficulty in walking, not elsewhere classified: Secondary | ICD-10-CM | POA: Diagnosis not present

## 2011-11-24 DIAGNOSIS — M1990 Unspecified osteoarthritis, unspecified site: Secondary | ICD-10-CM | POA: Diagnosis not present

## 2011-11-24 DIAGNOSIS — I2510 Atherosclerotic heart disease of native coronary artery without angina pectoris: Secondary | ICD-10-CM | POA: Diagnosis not present

## 2011-11-24 DIAGNOSIS — F0390 Unspecified dementia without behavioral disturbance: Secondary | ICD-10-CM | POA: Diagnosis not present

## 2011-11-24 DIAGNOSIS — I10 Essential (primary) hypertension: Secondary | ICD-10-CM | POA: Diagnosis not present

## 2011-11-24 DIAGNOSIS — M6281 Muscle weakness (generalized): Secondary | ICD-10-CM | POA: Diagnosis not present

## 2011-11-25 DIAGNOSIS — I2510 Atherosclerotic heart disease of native coronary artery without angina pectoris: Secondary | ICD-10-CM | POA: Diagnosis not present

## 2011-11-25 DIAGNOSIS — M6281 Muscle weakness (generalized): Secondary | ICD-10-CM | POA: Diagnosis not present

## 2011-11-25 DIAGNOSIS — M1990 Unspecified osteoarthritis, unspecified site: Secondary | ICD-10-CM | POA: Diagnosis not present

## 2011-11-25 DIAGNOSIS — I10 Essential (primary) hypertension: Secondary | ICD-10-CM | POA: Diagnosis not present

## 2011-11-25 DIAGNOSIS — F0390 Unspecified dementia without behavioral disturbance: Secondary | ICD-10-CM | POA: Diagnosis not present

## 2011-11-25 DIAGNOSIS — R262 Difficulty in walking, not elsewhere classified: Secondary | ICD-10-CM | POA: Diagnosis not present

## 2011-11-27 DIAGNOSIS — E7800 Pure hypercholesterolemia, unspecified: Secondary | ICD-10-CM | POA: Diagnosis not present

## 2011-11-27 DIAGNOSIS — I2510 Atherosclerotic heart disease of native coronary artery without angina pectoris: Secondary | ICD-10-CM | POA: Diagnosis not present

## 2011-11-27 DIAGNOSIS — E039 Hypothyroidism, unspecified: Secondary | ICD-10-CM | POA: Diagnosis not present

## 2011-11-27 DIAGNOSIS — I10 Essential (primary) hypertension: Secondary | ICD-10-CM | POA: Diagnosis not present

## 2011-12-01 DIAGNOSIS — H16229 Keratoconjunctivitis sicca, not specified as Sjogren's, unspecified eye: Secondary | ICD-10-CM | POA: Diagnosis not present

## 2011-12-01 DIAGNOSIS — I2510 Atherosclerotic heart disease of native coronary artery without angina pectoris: Secondary | ICD-10-CM | POA: Diagnosis not present

## 2011-12-01 DIAGNOSIS — F0390 Unspecified dementia without behavioral disturbance: Secondary | ICD-10-CM | POA: Diagnosis not present

## 2011-12-01 DIAGNOSIS — M1990 Unspecified osteoarthritis, unspecified site: Secondary | ICD-10-CM | POA: Diagnosis not present

## 2011-12-01 DIAGNOSIS — M6281 Muscle weakness (generalized): Secondary | ICD-10-CM | POA: Diagnosis not present

## 2011-12-01 DIAGNOSIS — R262 Difficulty in walking, not elsewhere classified: Secondary | ICD-10-CM | POA: Diagnosis not present

## 2011-12-01 DIAGNOSIS — I10 Essential (primary) hypertension: Secondary | ICD-10-CM | POA: Diagnosis not present

## 2011-12-01 DIAGNOSIS — H2510 Age-related nuclear cataract, unspecified eye: Secondary | ICD-10-CM | POA: Diagnosis not present

## 2012-01-15 DIAGNOSIS — M109 Gout, unspecified: Secondary | ICD-10-CM | POA: Diagnosis not present

## 2012-01-16 DIAGNOSIS — M159 Polyosteoarthritis, unspecified: Secondary | ICD-10-CM | POA: Diagnosis not present

## 2012-01-22 DIAGNOSIS — E559 Vitamin D deficiency, unspecified: Secondary | ICD-10-CM | POA: Diagnosis not present

## 2012-01-22 DIAGNOSIS — I10 Essential (primary) hypertension: Secondary | ICD-10-CM | POA: Diagnosis not present

## 2012-01-22 DIAGNOSIS — E7800 Pure hypercholesterolemia, unspecified: Secondary | ICD-10-CM | POA: Diagnosis not present

## 2012-01-22 DIAGNOSIS — M25519 Pain in unspecified shoulder: Secondary | ICD-10-CM | POA: Diagnosis not present

## 2012-01-22 DIAGNOSIS — M25549 Pain in joints of unspecified hand: Secondary | ICD-10-CM | POA: Diagnosis not present

## 2012-01-22 DIAGNOSIS — E039 Hypothyroidism, unspecified: Secondary | ICD-10-CM | POA: Diagnosis not present

## 2012-01-26 DIAGNOSIS — M109 Gout, unspecified: Secondary | ICD-10-CM | POA: Diagnosis not present

## 2012-01-26 DIAGNOSIS — M19049 Primary osteoarthritis, unspecified hand: Secondary | ICD-10-CM | POA: Diagnosis not present

## 2012-02-10 DIAGNOSIS — I2510 Atherosclerotic heart disease of native coronary artery without angina pectoris: Secondary | ICD-10-CM | POA: Diagnosis not present

## 2012-02-10 DIAGNOSIS — E7800 Pure hypercholesterolemia, unspecified: Secondary | ICD-10-CM | POA: Diagnosis not present

## 2012-02-10 DIAGNOSIS — I10 Essential (primary) hypertension: Secondary | ICD-10-CM | POA: Diagnosis not present

## 2012-02-10 DIAGNOSIS — E039 Hypothyroidism, unspecified: Secondary | ICD-10-CM | POA: Diagnosis not present

## 2012-02-12 DIAGNOSIS — Z23 Encounter for immunization: Secondary | ICD-10-CM | POA: Diagnosis not present

## 2012-02-17 ENCOUNTER — Ambulatory Visit (INDEPENDENT_AMBULATORY_CARE_PROVIDER_SITE_OTHER): Payer: Medicare Other | Admitting: Nurse Practitioner

## 2012-02-17 ENCOUNTER — Encounter: Payer: Self-pay | Admitting: Nurse Practitioner

## 2012-02-17 VITALS — BP 152/60 | HR 77 | Ht 63.0 in | Wt 134.0 lb

## 2012-02-17 DIAGNOSIS — R55 Syncope and collapse: Secondary | ICD-10-CM

## 2012-02-17 NOTE — Patient Instructions (Addendum)
We are going to put a monitor on to watch your heart rhythm for the next month  I need to see you in about 5 weeks  No change in your medicines for now.  Call the [facility] office at [phone] if you have any questions, problems or concerns.

## 2012-02-17 NOTE — Progress Notes (Signed)
Natalie Bullock Date of Birth: January 27, 1925 Medical Record #[id number]  History of Present Illness: Natalie Bullock is seen back today for a work in visit. She is seen for Dr. Jens Som. She is a former patient of Dr. Ronnald Nian. She has known CAD with remote CABG in 2002. She had a normal Myoview almost one year ago. EF is normal per echo back in March. Her other problems include HLD, HTN and OA.  She comes in today. She is here with her niece, Misty Stanley. Natalie Bullock was last seen here in December and was felt to be doing well. In the early spring, she began having "black out" spells and started falling. She was taken to the ER several times. No cause found.  She actually ended up in a rehab facility. Was seen by neurology and thought to have a vascular dementia and started on Aricept. Has been back home since May. She has switched her PCP to Dr. Chestine Spore. He has told her that he did not feel this was dementia but possibly a heart rhythm issue. Her spells have now stopped. Besides having arthritis, she feels ok. No chest pain. Not short of breath. She has stopped driving. She has lost weight, down 20 pounds, but says it was because "no one was feeding her there".   Current Outpatient Prescriptions on File Prior to Visit  Medication Sig Dispense Refill  . acetaminophen (TYLENOL) 325 MG tablet Take 2 tablets (650 mg total) by mouth every 6 (six) hours as needed for pain or fever. For pain  30 tablet    . amLODipine (NORVASC) 10 MG tablet Take 1 tablet (10 mg total) by mouth daily.      Marland Kitchen aspirin 81 MG tablet Take 162 mg by mouth daily.       . feeding supplement (ENSURE COMPLETE) LIQD Take 237 mLs by mouth 2 (two) times daily between meals.      . Ferrous Sulfate (FEROSUL PO) Take by mouth.      . levothyroxine (SYNTHROID, LEVOTHROID) 50 MCG tablet Take 50 mcg by mouth daily before breakfast.       . Multiple Vitamin (MULITIVITAMIN WITH MINERALS) TABS Take 1 tablet by mouth daily.      Bertram Gala Glycol-Propyl Glycol  (SYSTANE OP) Place 2 drops into both eyes as needed. For eye dryness.      . pravastatin (PRAVACHOL) 40 MG tablet Take 40 mg by mouth every evening.       . promethazine (PHENERGAN) 25 MG tablet Take 25 mg by mouth every 6 (six) hours as needed. For nausea      . simethicone (MYLICON) 80 MG chewable tablet Chew 80 mg by mouth daily as needed. For bloating      . traMADol (ULTRAM) 50 MG tablet Take 50 mg by mouth every 8 (eight) hours as needed. For pain        Allergies  Allergen Reactions  . Valium Shortness Of Breath  . Codeine Other (See Comments)    unknown  . Crestor (Rosuvastatin Calcium) Other (See Comments)    unknown  . Doxycycline Other (See Comments)    uknown  . Ezetimibe-Simvastatin Other (See Comments)    unknown  . Lipitor (Atorvastatin Calcium) Other (See Comments)    unknown  . Sulfa Drugs Cross Reactors Other (See Comments)    unknown  . Tequin Other (See Comments)    unknown  . Tylenol (Acetaminophen) Other (See Comments)    Unknown: aaNO. 3  . Zocor (Simvastatin) Other (See Comments)  unknown    Past Medical History  Diagnosis Date  . IHD (ischemic heart disease)     with CABG in August 2002  . Hypertension   . Hypothyroidism   . Vitamin d deficiency   . Hyperlipidemia   . Anemia   . Hx of CABG     pt states she had 5 bypasses  . Breast cancer 1975    Right sided mastectomy  . Coronary artery disease   . Myocardial infarction     Non Q-wave MI  . Arthritis     osteoarthritis  . H/O hypokalemia     Past Surgical History  Procedure Date  . Coronary artery bypass graft 12/2000    LEFT INTERNAL MAMMARY TO THE LAD, SAPHENOUS VEIN GRAFT TO THE FIRST DIAGONAL, SAPHENOUS VEIN GRAFT TO THE RAMUS INTERMEDIATE, AND A SEQUENTIAL SAPHENOUS VEIN GRAFT TO THE POSTERIOR DESCENDING AND POSTERIOR LATERAL BRANCHES.  . Mastectomy 1975    RIGHT SIDE  . Cardiovascular stress test Aug 2010    Normal; EF 74%  . Cardiac catheterization 01/20/2001    NORMAL. EF  60-70%  . Tubal ligation     1960's  . Breast surgery     left breast cyst surgery   . Mass excision 04/08/2011    Procedure: EXCISION MASS;  Surgeon: Nicki Reaper, MD;  Location: [facility];  Service: Orthopedics;  Laterality: Left;  excision mass left palm    History  Smoking status  . Former Smoker -- 1.0 packs/day for 4 years  . Types: Cigarettes  . Quit date: 06/02/1969  Smokeless tobacco  . Never Used    History  Alcohol Use No    Family History  Problem Relation Age of Onset  . Alzheimer's disease Mother     Review of Systems: The review of systems is per the HPI.  All other systems were reviewed and are negative.  Physical Exam: BP 152/60  Pulse 77  Ht 5\' 3"  (1.6 m)  Wt 134 lb (60.782 kg)  BMI 23.74 kg/m2 Patient is very pleasant and in no acute distress. She is thinner. Skin is warm and dry. Color is normal.  HEENT is unremarkable. Normocephalic/atraumatic. PERRL. Sclera are nonicteric. Neck is supple. No masses. No JVD. Lungs are clear. Cardiac exam shows a regular rate and rhythm. Abdomen is soft. Extremities are without edema. Gait and ROM are intact. No gross neurologic deficits noted.  LABORATORY DATA:  Lab Results  Component Value Date   WBC 7.2 09/27/2011   HGB 11.3* 09/27/2011   HCT 34.4* 09/27/2011   PLT 269 09/27/2011   GLUCOSE 103* 09/27/2011   CHOL 114 08/15/2011   TRIG 63 08/15/2011   HDL 60 08/15/2011   LDLCALC 41 08/15/2011   ALT 19 09/27/2011   AST 21 09/27/2011   NA 136 09/27/2011   K 3.8 09/27/2011   CL 100 09/27/2011   CREATININE 0.70 09/27/2011   BUN 13 09/27/2011   CO2 28 09/27/2011   TSH 0.718 09/13/2011   INR 1.08 08/14/2011   HGBA1C 5.4 08/15/2011    Assessment / Plan: 1. Syncope of uncertain etiology. She had extensive testing in the hospital with CT and MRIs. Last stress test was almost one year ago and was normal. Last echo this past March showed a normal EF. Will place an event monitor. Unfortunately, her spells have  basically stopped. May need to go back and see neurology. I will see her back in 5 weeks.  2. CAD - remote  CABG. No chest pain.  Patient is agreeable to this plan and will call if any problems develop in the interim.

## 2012-02-18 DIAGNOSIS — R55 Syncope and collapse: Secondary | ICD-10-CM | POA: Diagnosis not present

## 2012-02-20 ENCOUNTER — Telehealth: Payer: Self-pay | Admitting: Nurse Practitioner

## 2012-02-20 NOTE — Telephone Encounter (Signed)
New Problem:    Patient called in because due to the arthritis in her right hand she is unable to lift or attach her heart monitor to herself and would like to know if there was a different type of monitor she would be able to use.  Please call back.

## 2012-02-20 NOTE — Telephone Encounter (Signed)
**Note De-Identified  Obfuscation** Per Norma Fredrickson, NP pt. is encouraged to continue to wear monitor as a loop recorder would be the alternative. Pt. advised to have her niece or nephew help her with monitor, she verbalized understanding./LV

## 2012-02-23 DIAGNOSIS — M19049 Primary osteoarthritis, unspecified hand: Secondary | ICD-10-CM | POA: Diagnosis not present

## 2012-02-27 ENCOUNTER — Telehealth: Payer: Self-pay | Admitting: Cardiology

## 2012-02-27 NOTE — Telephone Encounter (Signed)
Pt has questions regarding monitor she is wearing

## 2012-02-27 NOTE — Telephone Encounter (Signed)
All questions answered, pt. Verbalized understanding./LV

## 2012-03-15 ENCOUNTER — Telehealth: Payer: Self-pay | Admitting: Cardiology

## 2012-03-15 NOTE — Telephone Encounter (Signed)
New Problem:    Patient wanted to know when she needed to  Bring her monitor back.  Please call back.

## 2012-03-18 DIAGNOSIS — M19049 Primary osteoarthritis, unspecified hand: Secondary | ICD-10-CM | POA: Diagnosis not present

## 2012-03-23 ENCOUNTER — Encounter: Payer: Self-pay | Admitting: Nurse Practitioner

## 2012-03-23 ENCOUNTER — Ambulatory Visit (INDEPENDENT_AMBULATORY_CARE_PROVIDER_SITE_OTHER): Payer: Medicare Other | Admitting: Nurse Practitioner

## 2012-03-23 VITALS — BP 142/60 | HR 68 | Ht 64.0 in | Wt 135.0 lb

## 2012-03-23 DIAGNOSIS — R55 Syncope and collapse: Secondary | ICD-10-CM | POA: Diagnosis not present

## 2012-03-23 NOTE — Patient Instructions (Addendum)
Stay on your current medicines  Let us know if your passing out spells come back  We will see you in December with Dr. Jens Som.   Call the [facility] ([facility]) office at [phone] if you have any questions, problems or concerns.

## 2012-03-23 NOTE — Progress Notes (Signed)
Natalie Bullock Date of Birth: Feb 03, 1925 Medical Record #[id number]  History of Present Illness: Natalie Bullock is seen back today for a 5 week check. She is seen for Dr. Jens Som. She has known CAD with remote CABG in 2002. She had a normal Myoview about a year ago. EF is normal per echo back in March. Her other problems include HLD, HTN and OA.   She had an event monitor for the last 5 weeks due to recurrent "black out" spells back in the early spring. We were not informed of these episodes. She stopped driving. She had not had recurrence. Her monitor was placed "after the fact". Apparently neurology thought she had some type of vascular dementia. Her new PCP, Dr. Chestine Spore, thought it was a possible rhythm issue. Her event monitor was negative for significant arrhythmia.   She comes in today. She is here with her nephew, Loraine Leriche. She is doing ok. Using a walker. Weight is still down. She has had one fall in the last month. Says she has not had anymore passing out spells and is adamant that her fall was not due to passing out. No chest pain. Not short of breath. More limited by arthritis, especially in her right hand. She has an appointment to see Dr. Kellie Simmering for discussion.   Current Outpatient Prescriptions on File Prior to Visit  Medication Sig Dispense Refill  . amLODipine (NORVASC) 10 MG tablet Take 1 tablet (10 mg total) by mouth daily.      Marland Kitchen aspirin 81 MG tablet Take 81 mg by mouth daily.       . feeding supplement (ENSURE COMPLETE) LIQD Take 237 mLs by mouth 2 (two) times daily between meals.      Marland Kitchen levothyroxine (SYNTHROID, LEVOTHROID) 50 MCG tablet Take 50 mcg by mouth daily before breakfast.       . Polyethyl Glycol-Propyl Glycol (SYSTANE OP) Place 2 drops into both eyes as needed. For eye dryness.      . pravastatin (PRAVACHOL) 40 MG tablet Take 40 mg by mouth every evening.       . promethazine (PHENERGAN) 25 MG tablet Take 25 mg by mouth every 6 (six) hours as needed. For nausea      .  traMADol (ULTRAM) 50 MG tablet Take 50 mg by mouth every 8 (eight) hours as needed. For pain      . triamterene-hydrochlorothiazide (MAXZIDE-25) 37.5-25 MG per tablet Take 1 tablet by mouth daily.        Allergies  Allergen Reactions  . Valium Shortness Of Breath  . Codeine Other (See Comments)    unknown  . Crestor (Rosuvastatin Calcium) Other (See Comments)    unknown  . Doxycycline Other (See Comments)    uknown  . Ezetimibe-Simvastatin Other (See Comments)    unknown  . Lipitor (Atorvastatin Calcium) Other (See Comments)    unknown  . Sulfa Drugs Cross Reactors Other (See Comments)    unknown  . Tequin Other (See Comments)    unknown  . Tylenol (Acetaminophen) Other (See Comments)    Unknown: aaNO. 3  . Zocor (Simvastatin) Other (See Comments)    unknown    Past Medical History  Diagnosis Date  . IHD (ischemic heart disease)     with CABG in August 2002  . Hypertension   . Hypothyroidism   . Vitamin D deficiency   . Hyperlipidemia   . Anemia   . Hx of CABG     pt states she had 5 bypasses  .  Breast cancer 1975    Right sided mastectomy  . Coronary artery disease   . Myocardial infarction     Non Q-wave MI  . Arthritis     osteoarthritis  . H/O hypokalemia     Past Surgical History  Procedure Date  . Coronary artery bypass graft 12/2000    LEFT INTERNAL MAMMARY TO THE LAD, SAPHENOUS VEIN GRAFT TO THE FIRST DIAGONAL, SAPHENOUS VEIN GRAFT TO THE RAMUS INTERMEDIATE, AND A SEQUENTIAL SAPHENOUS VEIN GRAFT TO THE POSTERIOR DESCENDING AND POSTERIOR LATERAL BRANCHES.  . Mastectomy 1975    RIGHT SIDE  . Cardiovascular stress test Aug 2010    Normal; EF 74%  . Cardiac catheterization 01/20/2001    NORMAL. EF 60-70%  . Tubal ligation     1960's  . Breast surgery     left breast cyst surgery   . Mass excision 04/08/2011    Procedure: EXCISION MASS;  Surgeon: Nicki Reaper, MD;  Location: [facility];  Service: Orthopedics;  Laterality: Left;  excision  mass left palm    History  Smoking status  . Former Smoker -- 1.0 packs/day for 4 years  . Types: Cigarettes  . Quit date: 06/02/1969  Smokeless tobacco  . Never Used    History  Alcohol Use No    Family History  Problem Relation Age of Onset  . Alzheimer's disease Mother     Review of Systems: The review of systems is per the HPI.  All other systems were reviewed and are negative.  Physical Exam: BP 142/60  Pulse 68  Ht 5\' 4"  (1.626 m)  Wt 135 lb (61.236 kg)  BMI 23.17 kg/m2 Patient is very pleasant and in no acute distress. Skin is warm and dry. Color is normal.  HEENT is unremarkable. Normocephalic/atraumatic. PERRL. Sclera are nonicteric. Neck is supple. No masses. No JVD. Lungs are clear. Cardiac exam shows a regular rate and rhythm. Abdomen is soft. Extremities are without edema. Gait and ROM are intact. No gross neurologic deficits noted.   LABORATORY DATA:  Lab Results  Component Value Date   WBC 7.2 09/27/2011   HGB 11.3* 09/27/2011   HCT 34.4* 09/27/2011   PLT 269 09/27/2011   GLUCOSE 103* 09/27/2011   CHOL 114 08/15/2011   TRIG 63 08/15/2011   HDL 60 08/15/2011   LDLCALC 41 08/15/2011   ALT 19 09/27/2011   AST 21 09/27/2011   NA 136 09/27/2011   K 3.8 09/27/2011   CL 100 09/27/2011   CREATININE 0.70 09/27/2011   BUN 13 09/27/2011   CO2 28 09/27/2011   TSH 0.718 09/13/2011   INR 1.08 08/14/2011   HGBA1C 5.4 08/15/2011    Assessment / Plan:  1. Syncope - negative event monitor. If she has recurrence, then will need to refer to EP for loop recorder implant.   2. CAD - remote CABG. No symptoms reported.   3. Fall - she will continue to use her walker.   We will get her recall visit with Dr. Jens Som scheduled for December. No change in her medicines today.   Patient is agreeable to this plan and will call if any problems develop in the interim.

## 2012-03-30 DIAGNOSIS — Z79899 Other long term (current) drug therapy: Secondary | ICD-10-CM | POA: Diagnosis not present

## 2012-03-30 DIAGNOSIS — M130 Polyarthritis, unspecified: Secondary | ICD-10-CM | POA: Diagnosis not present

## 2012-03-30 DIAGNOSIS — R700 Elevated erythrocyte sedimentation rate: Secondary | ICD-10-CM | POA: Diagnosis not present

## 2012-03-30 DIAGNOSIS — M25569 Pain in unspecified knee: Secondary | ICD-10-CM | POA: Diagnosis not present

## 2012-04-12 DIAGNOSIS — M19049 Primary osteoarthritis, unspecified hand: Secondary | ICD-10-CM | POA: Diagnosis not present

## 2012-04-13 DIAGNOSIS — E039 Hypothyroidism, unspecified: Secondary | ICD-10-CM | POA: Diagnosis not present

## 2012-04-13 DIAGNOSIS — I2510 Atherosclerotic heart disease of native coronary artery without angina pectoris: Secondary | ICD-10-CM | POA: Diagnosis not present

## 2012-04-13 DIAGNOSIS — I10 Essential (primary) hypertension: Secondary | ICD-10-CM | POA: Diagnosis not present

## 2012-04-13 DIAGNOSIS — E7800 Pure hypercholesterolemia, unspecified: Secondary | ICD-10-CM | POA: Diagnosis not present

## 2012-05-11 DIAGNOSIS — Z79899 Other long term (current) drug therapy: Secondary | ICD-10-CM | POA: Diagnosis not present

## 2012-05-11 DIAGNOSIS — R700 Elevated erythrocyte sedimentation rate: Secondary | ICD-10-CM | POA: Diagnosis not present

## 2012-05-11 DIAGNOSIS — M130 Polyarthritis, unspecified: Secondary | ICD-10-CM | POA: Diagnosis not present

## 2012-05-11 DIAGNOSIS — M25569 Pain in unspecified knee: Secondary | ICD-10-CM | POA: Diagnosis not present

## 2012-05-19 ENCOUNTER — Ambulatory Visit (INDEPENDENT_AMBULATORY_CARE_PROVIDER_SITE_OTHER): Payer: Medicare Other | Admitting: Cardiology

## 2012-05-19 ENCOUNTER — Encounter: Payer: Self-pay | Admitting: Cardiology

## 2012-05-19 VITALS — BP 124/58 | HR 68 | Ht 64.0 in | Wt 106.0 lb

## 2012-05-19 DIAGNOSIS — E785 Hyperlipidemia, unspecified: Secondary | ICD-10-CM

## 2012-05-19 DIAGNOSIS — I10 Essential (primary) hypertension: Secondary | ICD-10-CM | POA: Diagnosis not present

## 2012-05-19 DIAGNOSIS — I2510 Atherosclerotic heart disease of native coronary artery without angina pectoris: Secondary | ICD-10-CM

## 2012-05-19 DIAGNOSIS — R55 Syncope and collapse: Secondary | ICD-10-CM | POA: Insufficient documentation

## 2012-05-19 NOTE — Assessment & Plan Note (Signed)
Continue aspirin and statin. 

## 2012-05-19 NOTE — Assessment & Plan Note (Signed)
Continue statin. 

## 2012-05-19 NOTE — Patient Instructions (Addendum)
Your physician wants you to follow-up in: ONE YEAR WITH DR CRENSHAW You will receive a reminder letter in the mail two months in advance. If you don't receive a letter, please call our office to schedule the follow-up appointment.  

## 2012-05-19 NOTE — Assessment & Plan Note (Signed)
Blood pressure controlled. 

## 2012-05-19 NOTE — Progress Notes (Signed)
HPI: Pleasant female for fu of CAD. Patient had CABG in 2002. Carotid dopplers in 2002 normal. Echo in March 2013 showed normal LV function, moderate MR and moderate PI. Myoview in Oct 2012 showed EF 74 and normal perfusion. Intolerant to all statins except pravachol. Patient was seen by Norma Fredrickson in September with recurrent syncopal episodes last spring. A monitor showed no arrhythmia. Apparently seen by neurology and felt to have possible vascular dementia. Since she was last seen she denies dyspnea, chest pain, palpitations, pedal edema or syncope.  Current Outpatient Prescriptions  Medication Sig Dispense Refill  . amLODipine (NORVASC) 10 MG tablet Take 1 tablet (10 mg total) by mouth daily.      Marland Kitchen aspirin 81 MG tablet Take 81 mg by mouth daily.       . feeding supplement (ENSURE COMPLETE) LIQD Take 237 mLs by mouth 2 (two) times daily between meals.      Marland Kitchen levothyroxine (SYNTHROID, LEVOTHROID) 50 MCG tablet Take 50 mcg by mouth daily before breakfast.       . Polyethyl Glycol-Propyl Glycol (SYSTANE OP) Place 2 drops into both eyes as needed. For eye dryness.      . pravastatin (PRAVACHOL) 40 MG tablet Take 40 mg by mouth every evening.       . predniSONE (DELTASONE) 5 MG tablet Take 5 mg by mouth. Alternate days of 5mg  and 10mg       . traMADol (ULTRAM) 50 MG tablet Take 50 mg by mouth every 8 (eight) hours as needed. For pain      . triamterene-hydrochlorothiazide (MAXZIDE-25) 37.5-25 MG per tablet Take 1 tablet by mouth daily.         Past Medical History  Diagnosis Date  . IHD (ischemic heart disease)     with CABG in August 2002  . Hypertension   . Hypothyroidism   . Vitamin D deficiency   . Hyperlipidemia   . Anemia   . Hx of CABG     pt states she had 5 bypasses  . Breast cancer 1975    Right sided mastectomy  . Coronary artery disease   . Myocardial infarction     Non Q-wave MI  . Arthritis     osteoarthritis  . H/O hypokalemia     Past Surgical History    Procedure Date  . Coronary artery bypass graft 12/2000    LEFT INTERNAL MAMMARY TO THE LAD, SAPHENOUS VEIN GRAFT TO THE FIRST DIAGONAL, SAPHENOUS VEIN GRAFT TO THE RAMUS INTERMEDIATE, AND A SEQUENTIAL SAPHENOUS VEIN GRAFT TO THE POSTERIOR DESCENDING AND POSTERIOR LATERAL BRANCHES.  . Mastectomy 1975    RIGHT SIDE  . Cardiovascular stress test Aug 2010    Normal; EF 74%  . Cardiac catheterization 01/20/2001    NORMAL. EF 60-70%  . Tubal ligation     1960's  . Breast surgery     left breast cyst surgery   . Mass excision 04/08/2011    Procedure: EXCISION MASS;  Surgeon: Nicki Reaper, MD;  Location: [facility];  Service: Orthopedics;  Laterality: Left;  excision mass left palm    History   Social History  . Marital Status: Widowed    Spouse Name: N/A    Number of Children: N/A  . Years of Education: N/A   Occupational History  . Not on file.   Social History Main Topics  . Smoking status: Former Smoker -- 1.0 packs/day for 4 years    Types: Cigarettes  Quit date: 06/02/1969  . Smokeless tobacco: Never Used  . Alcohol Use: No  . Drug Use: No  . Sexually Active: No   Other Topics Concern  . Not on file   Social History Narrative  . No narrative on file    ROS: no fevers or chills, productive cough, hemoptysis, dysphasia, odynophagia, melena, hematochezia, dysuria, hematuria, rash, seizure activity, orthopnea, PND, pedal edema, claudication. Remaining systems are negative.  Physical Exam: Well-developed well-nourished in no acute distress.  Skin is warm and dry.  HEENT is normal.  Neck is supple.  Chest is clear to auscultation with normal expansion.  Cardiovascular exam is regular rate and rhythm.  Abdominal exam nontender or distended. No masses palpated. Extremities show no edema. neuro grossly intact  ECG sinus rhythm at a rate of 64. No significant ST changes.

## 2012-05-19 NOTE — Assessment & Plan Note (Signed)
No recurrent episodes. Will consider an implantable loop later if she has recurrent events.

## 2012-05-24 DIAGNOSIS — M19049 Primary osteoarthritis, unspecified hand: Secondary | ICD-10-CM | POA: Diagnosis not present

## 2012-06-01 IMAGING — CT CT CERVICAL SPINE W/O CM
2 of 3 series · 7 of 14 positions shown, 9 images · non-contrast
Comparison: 08/14/2011

CLINICAL DATA: Laceration and hematoma to the forehead.  Fall.  C-
collar.

CT CERVICAL SPINE WITHOUT CONTRAST
TECHNIQUE: Multidetector CT imaging of the cervical spine was
performed. Multiplanar CT image reconstructions were also
generated.

[Series 4: c-spine st · axial · 0.29mm/px · z∈[+945,+1061]mm · 5 of 88 slices shown, 7 images]
[im 15/88  soft-tissue]
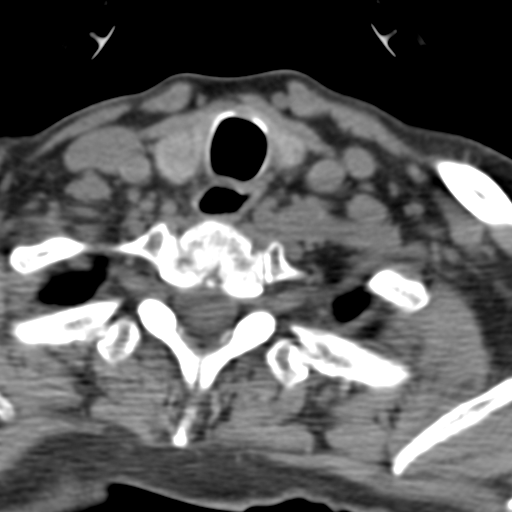
[im 15/88  bone]
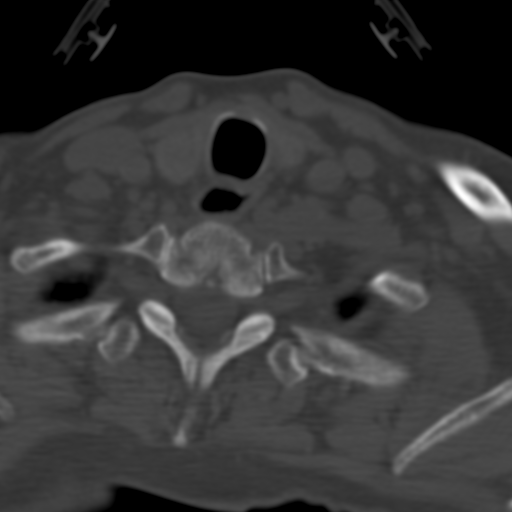
[im 30/88  bone]
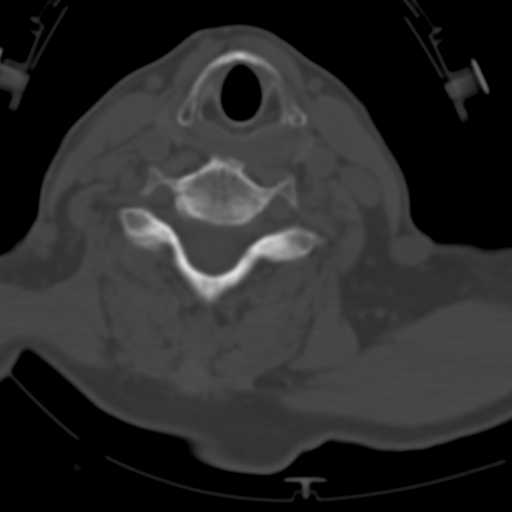
[im 44/88  bone]
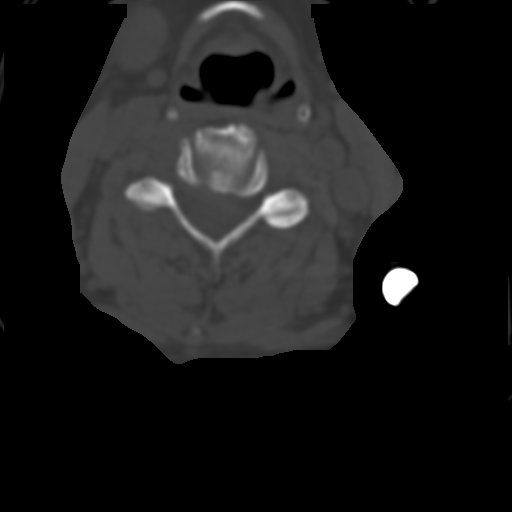
[im 59/88  bone]
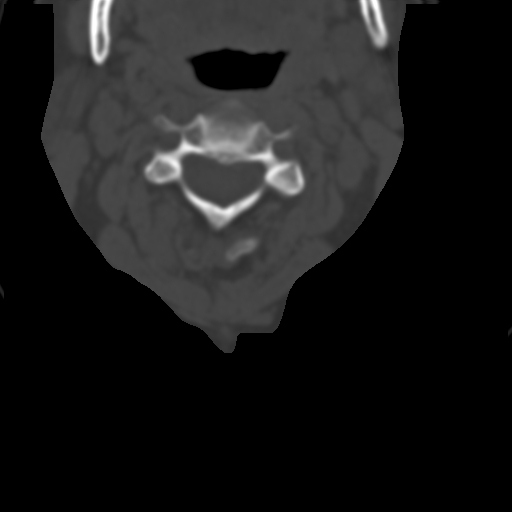
[im 73/88  soft-tissue]
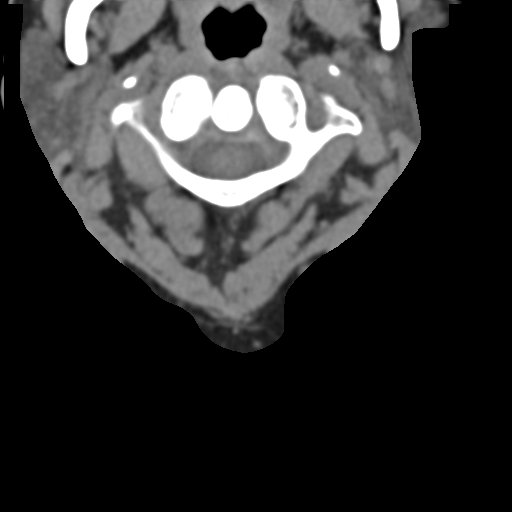
[im 73/88  bone]
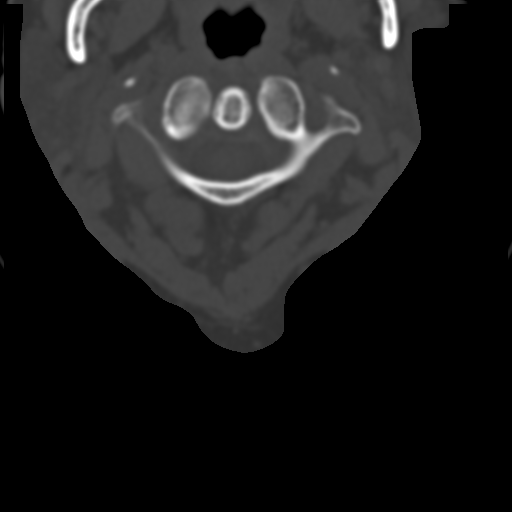

[Series 6: bone windows · axial · 0.43mm/px · z∈[+1135,+1189]mm · 2 of 55 slices shown]
[im 19/55  bone]
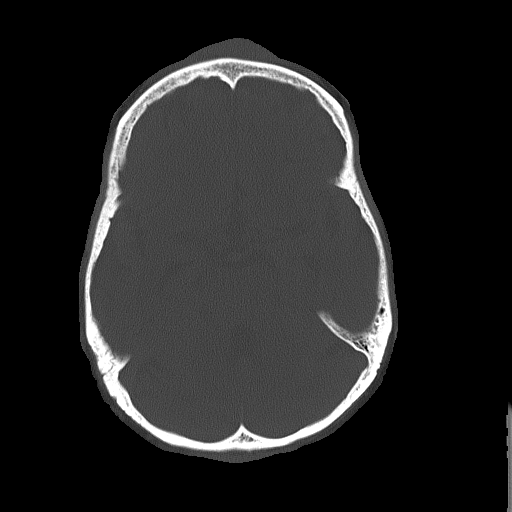
[im 37/55  bone]
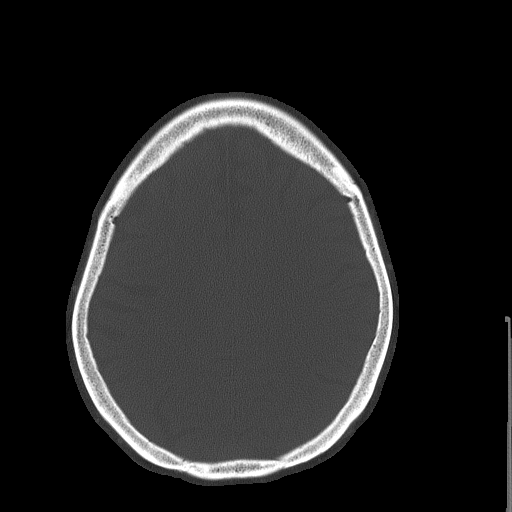

[7 of 14 positions shown; findings below may reference images not displayed]

FINDINGS: Normal alignment of the cervical vertebrae and facet
joints.  Degenerative changes in the cervical spine with narrowed
interspaces and associated endplate hypertrophic changes from C3-
C7.  Degenerative changes in the facet joints and at C1-2.  Lateral
masses of C1 appear symmetrical.  The odontoid process is intact.
No vertebral compression deformities.  No prevertebral soft tissue
swelling.  No paraspinal soft tissue infiltration.  Ligamentous
calcification over the C5 spinous process.  No focal bone lesion or
bone destruction.  Bone cortex and trabecular architecture appear
intact.  Vascular calcifications. No significant change since
previous study.
IMPRESSION: Degenerative changes in the cervical spine.  No displaced fractures
identified.

## 2012-06-03 DIAGNOSIS — H04129 Dry eye syndrome of unspecified lacrimal gland: Secondary | ICD-10-CM | POA: Diagnosis not present

## 2012-06-03 DIAGNOSIS — H2510 Age-related nuclear cataract, unspecified eye: Secondary | ICD-10-CM | POA: Diagnosis not present

## 2012-06-06 IMAGING — CR DG ABDOMEN ACUTE W/ 1V CHEST
3 series · 3 of 3 positions shown · non-contrast
Comparison: 08/04/2011 CT

CLINICAL DATA: Lower abdominal pain.

ACUTE ABDOMEN SERIES (ABDOMEN 2 VIEW & CHEST 1 VIEW)

[w abdomen decub]
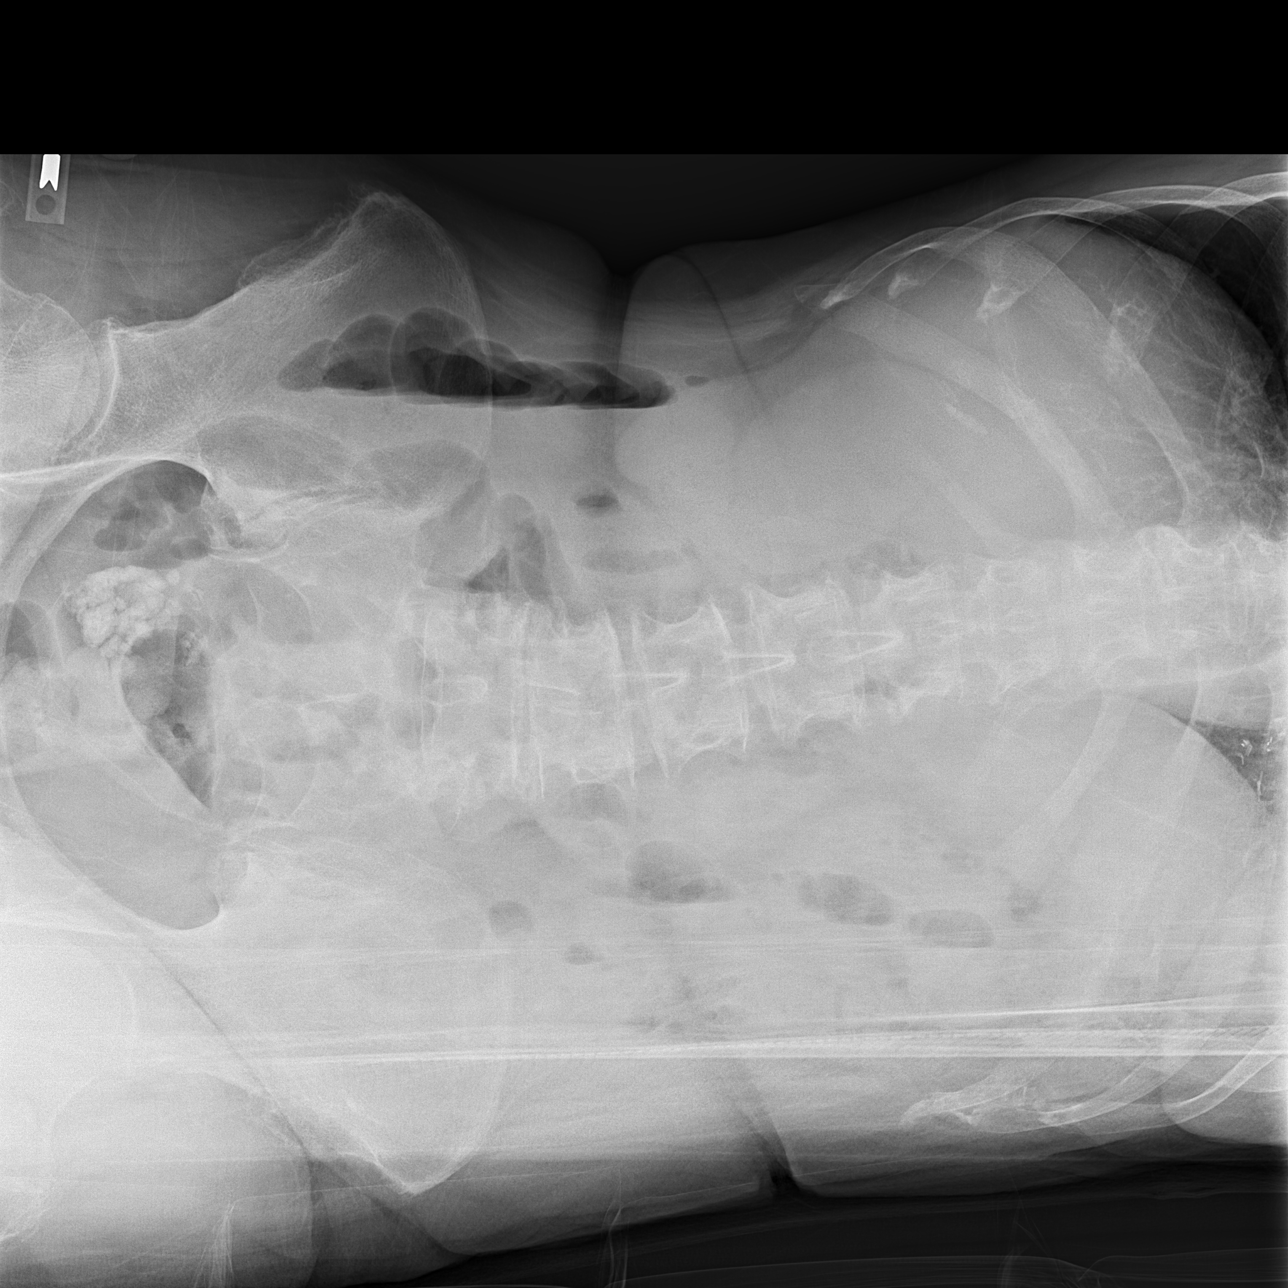

[x abdomen supine]
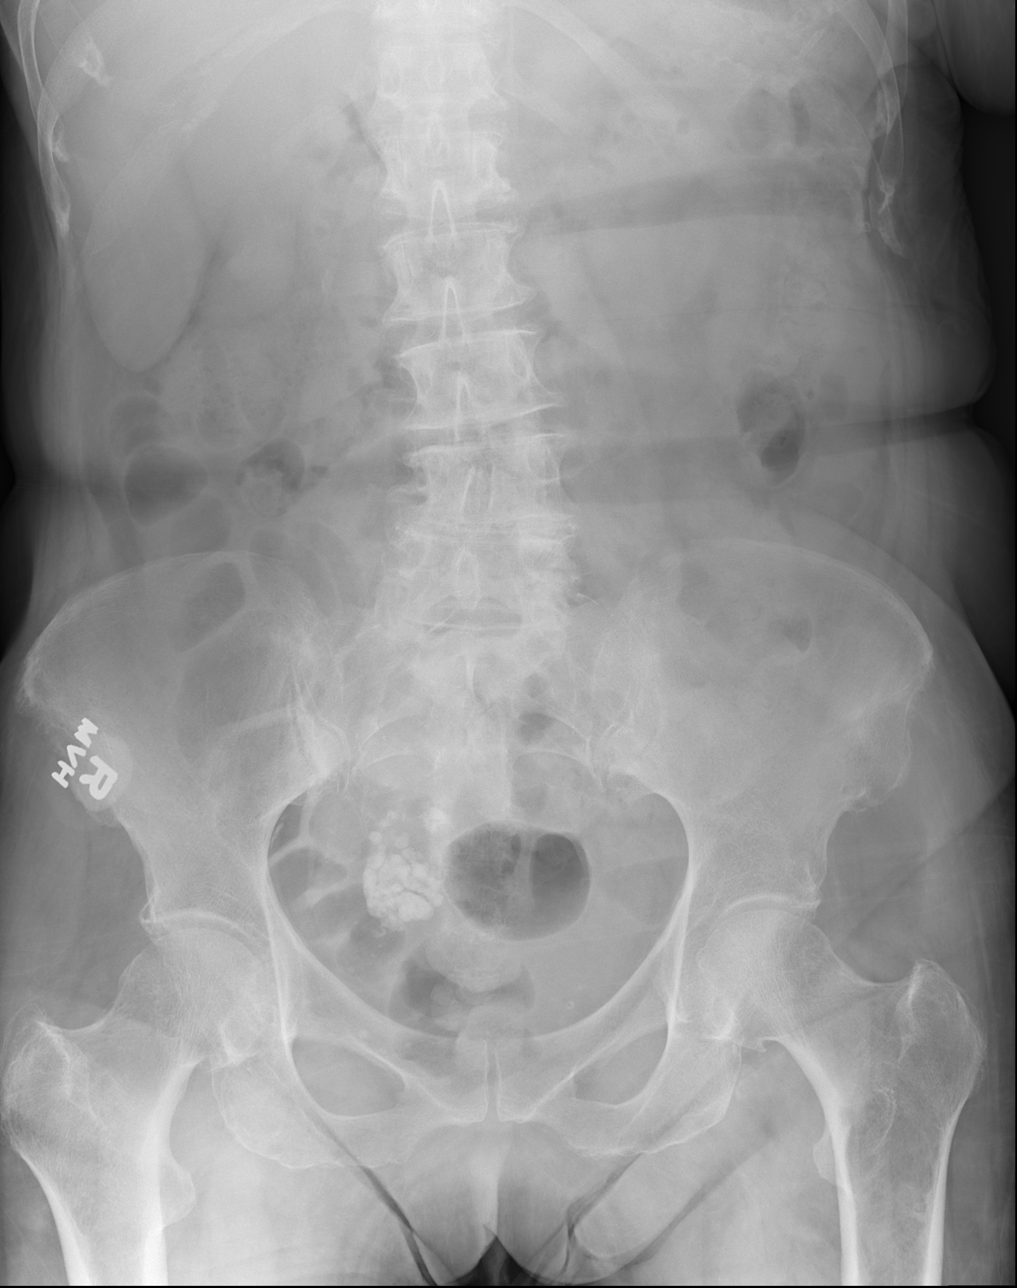

[x chest ap]
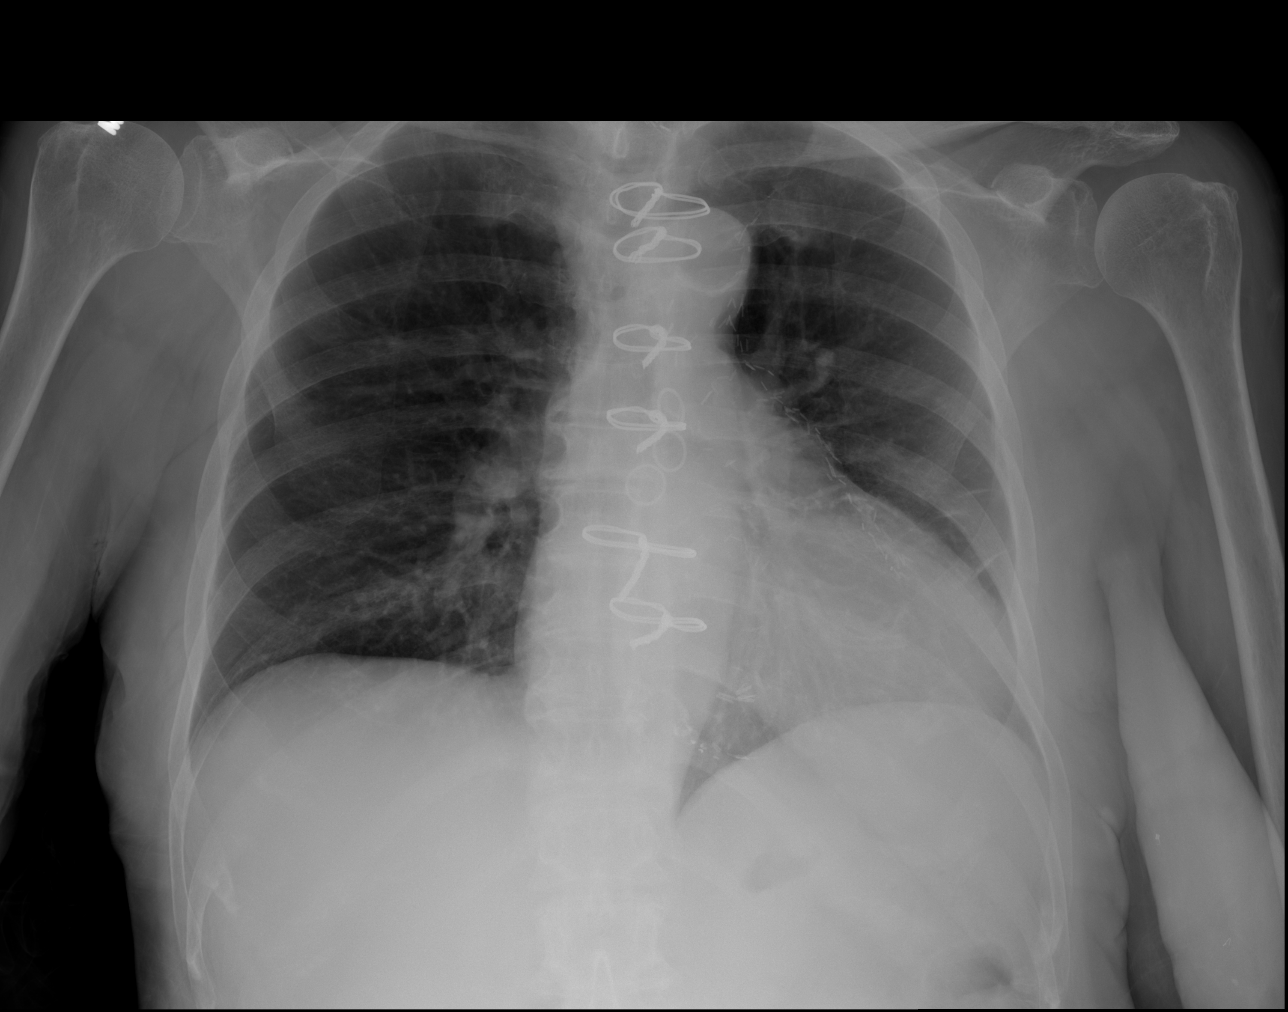

[3 of 3 positions shown; findings below may reference images not displayed]

FINDINGS: Cardiomegaly.  Status post median sternotomy and CABG.
Aortic arch atherosclerosis.  Mild lung base scarring versus
atelectasis is similar to prior.  No acute osseous abnormality.  No
pleural effusion or pneumothorax.

Bowel gas pattern is nonobstructive, with air noted within loops of
large and small bowel.  No free intraperitoneal air visualized on
the decubitus view.  Calcified density within the right pelvis is
most in keeping with calcified fibroids.  No acute osseous
abnormality identified. Bilateral hip DJD.
IMPRESSION: Status post median sternotomy and CABG.  No acute cardiopulmonary
process identified.

Nonobstructive bowel gas pattern.

Fibroid uterus.

## 2012-07-08 DIAGNOSIS — Z79899 Other long term (current) drug therapy: Secondary | ICD-10-CM | POA: Diagnosis not present

## 2012-07-08 DIAGNOSIS — M25569 Pain in unspecified knee: Secondary | ICD-10-CM | POA: Diagnosis not present

## 2012-07-08 DIAGNOSIS — M130 Polyarthritis, unspecified: Secondary | ICD-10-CM | POA: Diagnosis not present

## 2012-07-19 ENCOUNTER — Other Ambulatory Visit: Payer: Self-pay | Admitting: Cardiology

## 2012-07-22 DIAGNOSIS — I10 Essential (primary) hypertension: Secondary | ICD-10-CM | POA: Diagnosis not present

## 2012-08-11 DIAGNOSIS — Z1231 Encounter for screening mammogram for malignant neoplasm of breast: Secondary | ICD-10-CM | POA: Diagnosis not present

## 2012-10-04 DIAGNOSIS — M25539 Pain in unspecified wrist: Secondary | ICD-10-CM | POA: Diagnosis not present

## 2012-10-04 DIAGNOSIS — Z79899 Other long term (current) drug therapy: Secondary | ICD-10-CM | POA: Diagnosis not present

## 2012-10-04 DIAGNOSIS — M25569 Pain in unspecified knee: Secondary | ICD-10-CM | POA: Diagnosis not present

## 2012-11-08 DIAGNOSIS — E039 Hypothyroidism, unspecified: Secondary | ICD-10-CM | POA: Diagnosis not present

## 2012-11-08 DIAGNOSIS — I10 Essential (primary) hypertension: Secondary | ICD-10-CM | POA: Diagnosis not present

## 2012-11-08 DIAGNOSIS — M069 Rheumatoid arthritis, unspecified: Secondary | ICD-10-CM | POA: Diagnosis not present

## 2012-11-08 DIAGNOSIS — I2510 Atherosclerotic heart disease of native coronary artery without angina pectoris: Secondary | ICD-10-CM | POA: Diagnosis not present

## 2012-11-08 DIAGNOSIS — E7800 Pure hypercholesterolemia, unspecified: Secondary | ICD-10-CM | POA: Diagnosis not present

## 2012-11-15 DIAGNOSIS — M25539 Pain in unspecified wrist: Secondary | ICD-10-CM | POA: Diagnosis not present

## 2012-11-15 DIAGNOSIS — M25569 Pain in unspecified knee: Secondary | ICD-10-CM | POA: Diagnosis not present

## 2012-11-15 DIAGNOSIS — M545 Low back pain: Secondary | ICD-10-CM | POA: Diagnosis not present

## 2012-11-15 DIAGNOSIS — M25519 Pain in unspecified shoulder: Secondary | ICD-10-CM | POA: Diagnosis not present

## 2012-12-05 ENCOUNTER — Emergency Department (HOSPITAL_COMMUNITY)
Admission: EM | Admit: 2012-12-05 | Discharge: 2012-12-05 | Disposition: A | Discharge status: Home / Self Care | Payer: Medicare Other | Attending: Emergency Medicine | Admitting: Emergency Medicine

## 2012-12-05 ENCOUNTER — Encounter (HOSPITAL_COMMUNITY): Payer: Self-pay

## 2012-12-05 ENCOUNTER — Emergency Department (HOSPITAL_COMMUNITY): Payer: Medicare Other

## 2012-12-05 DIAGNOSIS — Z8679 Personal history of other diseases of the circulatory system: Secondary | ICD-10-CM | POA: Insufficient documentation

## 2012-12-05 DIAGNOSIS — S0990XA Unspecified injury of head, initial encounter: Secondary | ICD-10-CM | POA: Diagnosis not present

## 2012-12-05 DIAGNOSIS — I2510 Atherosclerotic heart disease of native coronary artery without angina pectoris: Secondary | ICD-10-CM | POA: Diagnosis not present

## 2012-12-05 DIAGNOSIS — Z7982 Long term (current) use of aspirin: Secondary | ICD-10-CM | POA: Insufficient documentation

## 2012-12-05 DIAGNOSIS — S0993XA Unspecified injury of face, initial encounter: Secondary | ICD-10-CM | POA: Diagnosis not present

## 2012-12-05 DIAGNOSIS — M129 Arthropathy, unspecified: Secondary | ICD-10-CM | POA: Diagnosis not present

## 2012-12-05 DIAGNOSIS — I252 Old myocardial infarction: Secondary | ICD-10-CM | POA: Insufficient documentation

## 2012-12-05 DIAGNOSIS — S79929A Unspecified injury of unspecified thigh, initial encounter: Secondary | ICD-10-CM | POA: Diagnosis not present

## 2012-12-05 DIAGNOSIS — I10 Essential (primary) hypertension: Secondary | ICD-10-CM | POA: Insufficient documentation

## 2012-12-05 DIAGNOSIS — Z862 Personal history of diseases of the blood and blood-forming organs and certain disorders involving the immune mechanism: Secondary | ICD-10-CM | POA: Insufficient documentation

## 2012-12-05 DIAGNOSIS — R296 Repeated falls: Secondary | ICD-10-CM | POA: Insufficient documentation

## 2012-12-05 DIAGNOSIS — S79919A Unspecified injury of unspecified hip, initial encounter: Secondary | ICD-10-CM | POA: Diagnosis not present

## 2012-12-05 DIAGNOSIS — R55 Syncope and collapse: Secondary | ICD-10-CM | POA: Insufficient documentation

## 2012-12-05 DIAGNOSIS — R404 Transient alteration of awareness: Secondary | ICD-10-CM | POA: Diagnosis not present

## 2012-12-05 DIAGNOSIS — Z951 Presence of aortocoronary bypass graft: Secondary | ICD-10-CM | POA: Diagnosis not present

## 2012-12-05 DIAGNOSIS — R42 Dizziness and giddiness: Secondary | ICD-10-CM | POA: Diagnosis not present

## 2012-12-05 DIAGNOSIS — Z853 Personal history of malignant neoplasm of breast: Secondary | ICD-10-CM | POA: Insufficient documentation

## 2012-12-05 DIAGNOSIS — Z87891 Personal history of nicotine dependence: Secondary | ICD-10-CM | POA: Insufficient documentation

## 2012-12-05 DIAGNOSIS — R079 Chest pain, unspecified: Secondary | ICD-10-CM | POA: Diagnosis not present

## 2012-12-05 DIAGNOSIS — Z79899 Other long term (current) drug therapy: Secondary | ICD-10-CM | POA: Insufficient documentation

## 2012-12-05 DIAGNOSIS — E039 Hypothyroidism, unspecified: Secondary | ICD-10-CM | POA: Diagnosis not present

## 2012-12-05 DIAGNOSIS — S199XXA Unspecified injury of neck, initial encounter: Secondary | ICD-10-CM | POA: Diagnosis not present

## 2012-12-05 DIAGNOSIS — Z8639 Personal history of other endocrine, nutritional and metabolic disease: Secondary | ICD-10-CM | POA: Insufficient documentation

## 2012-12-05 DIAGNOSIS — M549 Dorsalgia, unspecified: Secondary | ICD-10-CM | POA: Diagnosis not present

## 2012-12-05 DIAGNOSIS — S298XXA Other specified injuries of thorax, initial encounter: Secondary | ICD-10-CM | POA: Diagnosis not present

## 2012-12-05 DIAGNOSIS — Y929 Unspecified place or not applicable: Secondary | ICD-10-CM | POA: Insufficient documentation

## 2012-12-05 DIAGNOSIS — Y939 Activity, unspecified: Secondary | ICD-10-CM | POA: Insufficient documentation

## 2012-12-05 DIAGNOSIS — M25559 Pain in unspecified hip: Secondary | ICD-10-CM | POA: Diagnosis not present

## 2012-12-05 LAB — URINALYSIS, ROUTINE W REFLEX MICROSCOPIC
Glucose, UA: NEGATIVE mg/dL
Leukocytes, UA: NEGATIVE
Protein, ur: NEGATIVE mg/dL
Urobilinogen, UA: 0.2 mg/dL (ref 0.0–1.0)

## 2012-12-05 LAB — CBC WITH DIFFERENTIAL/PLATELET
Basophils Absolute: 0 10*3/uL (ref 0.0–0.1)
Basophils Relative: 0 % (ref 0–1)
Eosinophils Absolute: 0 10*3/uL (ref 0.0–0.7)
Eosinophils Relative: 0 % (ref 0–5)
Lymphs Abs: 0.8 10*3/uL (ref 0.7–4.0)
MCH: 27.4 pg (ref 26.0–34.0)
MCHC: 33.2 g/dL (ref 30.0–36.0)
MCV: 82.5 fL (ref 78.0–100.0)
Platelets: 179 10*3/uL (ref 150–400)
RDW: 13.5 % (ref 11.5–15.5)

## 2012-12-05 LAB — COMPREHENSIVE METABOLIC PANEL
ALT: 11 U/L (ref 0–35)
Albumin: 3.9 g/dL (ref 3.5–5.2)
Calcium: 10.8 mg/dL — ABNORMAL HIGH (ref 8.4–10.5)
GFR calc Af Amer: 89 mL/min — ABNORMAL LOW (ref >90)
Glucose, Bld: 104 mg/dL — ABNORMAL HIGH (ref 70–99)
Sodium: 134 mEq/L — ABNORMAL LOW (ref 135–145)
Total Protein: 8 g/dL (ref 6.0–8.3)

## 2012-12-05 LAB — POCT I-STAT TROPONIN I: Troponin i, poc: 0.03 ng/mL (ref 0.00–0.08)

## 2012-12-05 MED ORDER — SODIUM CHLORIDE 0.9 % IV SOLN
1000.0000 mL | INTRAVENOUS | Status: DC
  Administered 2012-12-05: 1000 mL via INTRAVENOUS

## 2012-12-05 MED ORDER — ONDANSETRON HCL 4 MG/2ML IJ SOLN
4.0000 mg | Freq: Once | INTRAMUSCULAR | Status: AC
  Administered 2012-12-05: 4 mg via INTRAVENOUS
  Filled 2012-12-05: qty 2

## 2012-12-05 MED ORDER — SODIUM CHLORIDE 0.9 % IV BOLUS (SEPSIS)
500.0000 mL | Freq: Once | INTRAVENOUS | Status: AC
  Administered 2012-12-05: 500 mL via INTRAVENOUS

## 2012-12-05 MED ORDER — MORPHINE SULFATE 4 MG/ML IJ SOLN
4.0000 mg | Freq: Once | INTRAMUSCULAR | Status: AC
  Administered 2012-12-05: 1 mg via INTRAVENOUS
  Filled 2012-12-05: qty 1

## 2012-12-05 NOTE — ED Provider Notes (Signed)
History    CSN: [id number] Arrival date & time 12/05/12  4098  First MD Initiated Contact with Patient 12/05/12 1853     Chief Complaint  Patient presents with  . Fall    right side/flank pain   (Consider location/radiation/quality/duration/timing/severity/associated sxs/prior Treatment) HPI  Natalie Bullock is a 79 y.o. female complaining of syncopal event this morning after having a bowel movement. Patient had completed a bowel movement and as she stood up she began to feel lightheaded and next thing she remembers she was on the floor. She denies any prodrome of chest pain, palpitations, diaphoresis. She has a past medical history significant for 5x CABG's. Patient reports a right-sided chest pain, and also a right hip pain. She denies shortness of breath, headache, difficulty in relating, abdominal pain, numbness paresthesia, dysarthria, ataxia, hematemesis, hematochezia, melena.   Cards [place]   Past Medical History  Diagnosis Date  . IHD (ischemic heart disease)     with CABG in August 2002  . Hypertension   . Hypothyroidism   . Vitamin D deficiency   . Hyperlipidemia   . Anemia   . Hx of CABG     pt states she had 5 bypasses  . Breast cancer 1975    Right sided mastectomy  . Coronary artery disease   . Myocardial infarction     Non Q-wave MI  . Arthritis     osteoarthritis  . H/O hypokalemia    Past Surgical History  Procedure Laterality Date  . Coronary artery bypass graft  12/2000    LEFT INTERNAL MAMMARY TO THE LAD, SAPHENOUS VEIN GRAFT TO THE FIRST DIAGONAL, SAPHENOUS VEIN GRAFT TO THE RAMUS INTERMEDIATE, AND A SEQUENTIAL SAPHENOUS VEIN GRAFT TO THE POSTERIOR DESCENDING AND POSTERIOR LATERAL BRANCHES.  . Mastectomy  1975    RIGHT SIDE  . Cardiovascular stress test  Aug 2010    Normal; EF 74%  . Cardiac catheterization  01/20/2001    NORMAL. EF 60-70%  . Tubal ligation      1960's  . Breast surgery      left breast cyst surgery   . Mass excision  04/08/2011     Procedure: EXCISION MASS;  Surgeon: Nicki Reaper, MD;  Location: [facility];  Service: Orthopedics;  Laterality: Left;  excision mass left palm   Family History  Problem Relation Age of Onset  . Alzheimer's disease Mother    History  Substance Use Topics  . Smoking status: Former Smoker -- 1.00 packs/day for 4 years    Types: Cigarettes    Quit date: 06/02/1969  . Smokeless tobacco: Never Used  . Alcohol Use: No   OB History   Grav Para Term Preterm Abortions TAB SAB Ect Mult Living                 Review of Systems  Allergies  Valium; Codeine; Crestor; Doxycycline; Ezetimibe-simvastatin; Lipitor; Sulfa drugs cross reactors; Tequin; and Zocor  Home Medications   Current Outpatient Rx  Name  Route  Sig  Dispense  Refill  . amLODipine (NORVASC) 10 MG tablet   Oral   Take 1 tablet (10 mg total) by mouth daily.         Marland Kitchen aspirin 81 MG tablet   Oral   Take 81 mg by mouth daily.          . feeding supplement (ENSURE COMPLETE) LIQD   Oral   Take 237 mLs by mouth 2 (two) times daily between  meals.         . levothyroxine (SYNTHROID, LEVOTHROID) 50 MCG tablet   Oral   Take 50 mcg by mouth daily before breakfast.          . pravastatin (PRAVACHOL) 40 MG tablet      TAKE ONE (1) TABLET EACH DAY   30 tablet   12   . Propylene Glycol (SYSTANE BALANCE OP)   Ophthalmic   Apply 2 drops to eye 2 (two) times daily.         . traMADol (ULTRAM) 50 MG tablet   Oral   Take 50 mg by mouth every 8 (eight) hours as needed. For pain         . triamterene-hydrochlorothiazide (MAXZIDE-25) 37.5-25 MG per tablet   Oral   Take 1 tablet by mouth daily.         Bertram Gala Glycol-Propyl Glycol (SYSTANE OP)   Both Eyes   Place 2 drops into both eyes as needed. For eye dryness.          BP 147/76  Pulse 82  Temp(Src) 99.5 F (37.5 C) (Oral)  Resp 13  SpO2 100% Physical Exam  Nursing note and vitals reviewed. Constitutional: She is oriented to  person, place, and time. She appears well-developed and well-nourished. No distress.  HENT:  Head: Normocephalic and atraumatic.  Mouth/Throat: Oropharynx is clear and moist.  Eyes: Conjunctivae and EOM are normal. Pupils are equal, round, and reactive to light.  Neck: Normal range of motion. No JVD present.  No midline tenderness to palpation or step-offs appreciated. Patient has full range of motion without pain.   Cardiovascular: Normal rate, regular rhythm and intact distal pulses.   Pulmonary/Chest: Effort normal and breath sounds normal. No stridor. No respiratory distress. She has no wheezes. She has no rales. She exhibits no tenderness.  Abdominal: Soft. Bowel sounds are normal. She exhibits no distension and no mass. There is no tenderness. There is no rebound and no guarding.  Musculoskeletal: Normal range of motion. She exhibits no edema.  Neurological: She is alert and oriented to person, place, and time.  Follows commands, Goal oriented speech, Strength is 5 out of 5x4 extremities, patient ambulates with a coordinated in nonantalgic gait. Sensation is grossly intact.   Skin: Skin is warm.  Psychiatric: She has a normal mood and affect.    ED Course  Procedures (including critical care time) Labs Reviewed  CBC WITH DIFFERENTIAL - Abnormal; Notable for the following:    WBC 10.7 (*)    Neutrophils Relative % 88 (*)    Neutro Abs 9.1 (*)    Lymphocytes Relative 8 (*)    All other components within normal limits  COMPREHENSIVE METABOLIC PANEL - Abnormal; Notable for the following:    Sodium 134 (*)    Glucose, Bld 104 (*)    Calcium 10.8 (*)    GFR calc non Af Amer 77 (*)    GFR calc Af Amer 89 (*)    All other components within normal limits  PROTIME-INR  URINALYSIS, ROUTINE W REFLEX MICROSCOPIC  POCT I-STAT TROPONIN I  POCT CBG (FASTING - GLUCOSE)-MANUAL ENTRY   Dg Chest 1 View  12/05/2012   *RADIOLOGY REPORT*  Clinical Data: Fall.  Chest pain.  Right hip pain.   CHEST - 1 VIEW  Comparison: 09/22/2011  Findings: Borderline cardiomegaly noted with prior CABG. Atherosclerotic calcification of the aortic arch noted.  The lungs appear clear.  No pleural effusion.  Thoracic spondylosis  is present.  IMPRESSION: Borderline cardiomegaly and thoracic spondylosis.   Original Report Authenticated By: Gaylyn Rong, M.D.   Dg Hip Complete Right  12/05/2012   *RADIOLOGY REPORT*  Clinical Data: Fall.  Right hip pain.  Chest pain.  RIGHT HIP - COMPLETE 2+ VIEW  Comparison: Multiple exams, including 09/27/2011 and 08/04/2011  Findings: Calcified uterine fibroid noted.  There is levoconvex lumbar scoliosis with lower lumbar spondylosis and degenerative disc disease.  Lucent lesion in the left proximal femoral shaft, similar morphology and visualized portion compared to 01/01/2006 radiographs.  Suspicious for fibrous dysplasia.  There is degenerative spurring of both femoral heads, similar to prior exams.  I do not observe a discrete fracture.  IMPRESSION:  1.  No acute bony findings.  If the patient is unable to bear weight, CT scan of the bony pelvis or MRI of the hip may be warranted. 2.  Chronic lucent lesion in the left femoral shaft, no change in visualized portion of the 2007. 3.  Lumbar spondylosis, scoliosis, and degenerative disc disease.  4.  Calcified uterine fibroid.   Original Report Authenticated By: Gaylyn Rong, M.D.   Ct Head Wo Contrast  12/05/2012   *RADIOLOGY REPORT*  Clinical Data:  Fall, dizziness, history of breast cancer  CT HEAD WITHOUT CONTRAST CT CERVICAL SPINE WITHOUT CONTRAST  Technique:  Multidetector CT imaging of the head and cervical spine was performed following the standard protocol without intravenous contrast.  Multiplanar CT image reconstructions of the cervical spine were also generated.  Comparison:  09/23/2011  CT HEAD  Findings: No evidence of parenchymal hemorrhage or extra-axial fluid collection. No mass lesion, mass effect, or midline  shift.  No CT evidence of acute infarction.  Subcortical white matter and periventricular small vessel ischemic changes.  Intracranial atherosclerosis.  Mild age related atrophy.  No ventriculomegaly.  The visualized paranasal sinuses are essentially clear. The mastoid air cells are unopacified.  No evidence of calvarial fracture.  IMPRESSION: No evidence of acute intracranial abnormality.  Atrophy with small vessel ischemic changes and intracranial atherosclerosis.  CT CERVICAL SPINE  Findings: Normal cervical lordosis.  No evidence of fracture dislocation.  The vertebral body heights are maintained.  Dens appears intact.  No prevertebral soft tissue swelling.  Moderate degenerative changes at C3-4 and C4-5.  Visualized right thyroid is mildly enlarged/heterogeneous.  Visualized lung apices are clear.  IMPRESSION: No evidence of traumatic injury to the cervical spine.  Moderate degenerative changes at C3-4 and C4-5.   Original Report Authenticated By: Charline Bills, M.D.   Ct Cervical Spine Wo Contrast  12/05/2012   *RADIOLOGY REPORT*  Clinical Data:  Fall, dizziness, history of breast cancer  CT HEAD WITHOUT CONTRAST CT CERVICAL SPINE WITHOUT CONTRAST  Technique:  Multidetector CT imaging of the head and cervical spine was performed following the standard protocol without intravenous contrast.  Multiplanar CT image reconstructions of the cervical spine were also generated.  Comparison:  09/23/2011  CT HEAD  Findings: No evidence of parenchymal hemorrhage or extra-axial fluid collection. No mass lesion, mass effect, or midline shift.  No CT evidence of acute infarction.  Subcortical white matter and periventricular small vessel ischemic changes.  Intracranial atherosclerosis.  Mild age related atrophy.  No ventriculomegaly.  The visualized paranasal sinuses are essentially clear. The mastoid air cells are unopacified.  No evidence of calvarial fracture.  IMPRESSION: No evidence of acute intracranial  abnormality.  Atrophy with small vessel ischemic changes and intracranial atherosclerosis.  CT CERVICAL SPINE  Findings: Normal cervical  lordosis.  No evidence of fracture dislocation.  The vertebral body heights are maintained.  Dens appears intact.  No prevertebral soft tissue swelling.  Moderate degenerative changes at C3-4 and C4-5.  Visualized right thyroid is mildly enlarged/heterogeneous.  Visualized lung apices are clear.  IMPRESSION: No evidence of traumatic injury to the cervical spine.  Moderate degenerative changes at C3-4 and C4-5.   Original Report Authenticated By: Charline Bills, M.D.   1. Syncope     MDM   Filed Vitals:   12/05/12 1839 12/05/12 1912  BP: 149/59 147/76  Pulse: 82   Temp: 99.5 F (37.5 C)   TempSrc: Oral   Resp: 16 13  SpO2: 97% 100%     Natalie Bullock is a 79 y.o. female with syncopal event. Patient has been worked up for this in the past no cause for her syncope could be determined by ED or her cardiologist. EKG shows no dysrhythmia, she is not anemic, urine electrolyte abnormalities. Head, cervical spine, chest x-ray and right hip imaging are all negative.  This is a shared visit with the attending physician who personally evaluated the patient and agrees with the care plan.   Cardiology consult from Dr. Mayford Knife appreciated: She is reviewed Dr. Ludwig Clarks records and recommends that the patient be DC'd to home with close followup with Dr. Jens Som in a.m. for discussion of possible Loop recorder placement  Medications  0.9 %  sodium chloride infusion (1,000 mLs Intravenous New Bag/Given 12/05/12 2033)  morphine 4 MG/ML injection 4 mg (1 mg Intravenous Given 12/05/12 2037)  ondansetron (ZOFRAN) injection 4 mg (4 mg Intravenous Given 12/05/12 2037)    Pt is hemodynamically stable, appropriate for, and amenable to discharge at this time. Pt verbalized understanding and agrees with care plan. Outpatient follow-up and specific return precautions discussed.      Wynetta Emery, PA-C 12/06/12 [phone]

## 2012-12-05 NOTE — ED Notes (Signed)
Patient acknowledges hurting on right side and lower back all the way across.

## 2012-12-05 NOTE — ED Notes (Signed)
She states she became "dizzy" after having b.m. This afternoon and fell in her bathroom.  She denies syncope, and is alert and oriented x 4 and in no distress.

## 2012-12-06 ENCOUNTER — Telehealth: Payer: Self-pay | Admitting: Cardiology

## 2012-12-06 NOTE — Telephone Encounter (Signed)
Spoke with pt, yesterday after having a bowel movement, she stood up, her legs got weak and she went down to the floor and was unable to get up. She did not lose consciousness. She denies any symptoms of palpitations, dizziness, lightheadedness or chest pain. She reports for the last week she has been doing well. Staying out of the weather. She has not had this happen in two years. They told her to follow up with [place] asap. Follow up scheduled

## 2012-12-06 NOTE — Telephone Encounter (Signed)
Niece aware dr Jens Som is okay with the 12-22-12 appt. Niece is addiment that dr turner told them she would be seen today. Pa schedule is full, pt will be seen Wednesday 12-08-12. They will call prior to appt with concerns.

## 2012-12-06 NOTE — Telephone Encounter (Signed)
New problem  Pt was seen in the ED last night for Syncope and was advised to Follow up with Olga Millers, MD. (first thing tomorrow morning)

## 2012-12-06 NOTE — Telephone Encounter (Signed)
New Prob     Would like to speak to nurse regarding upcoming follow up appt. Please call.

## 2012-12-06 NOTE — ED Provider Notes (Signed)
Medical screening examination/treatment/procedure(s) were conducted as a shared visit with non-physician practitioner(s) and myself.  I personally evaluated the patient during the encounter  Ethelda Chick, MD 12/06/12 1506

## 2012-12-06 NOTE — Telephone Encounter (Addendum)
Spoke with pt niece, they are concerned because the pt has dementia and they are reporting they do not know what happened last night. The niece is concerned because the appt is so far out. Will discuss with dr Jens Som.

## 2012-12-07 ENCOUNTER — Encounter: Payer: Self-pay | Admitting: Physician Assistant

## 2012-12-07 ENCOUNTER — Telehealth: Payer: Self-pay | Admitting: Cardiology

## 2012-12-07 ENCOUNTER — Ambulatory Visit (INDEPENDENT_AMBULATORY_CARE_PROVIDER_SITE_OTHER): Payer: Medicare Other | Admitting: Physician Assistant

## 2012-12-07 ENCOUNTER — Telehealth: Payer: Self-pay | Admitting: *Deleted

## 2012-12-07 VITALS — BP 151/78 | HR 80 | Ht 63.0 in | Wt 139.0 lb

## 2012-12-07 DIAGNOSIS — R42 Dizziness and giddiness: Secondary | ICD-10-CM | POA: Diagnosis not present

## 2012-12-07 DIAGNOSIS — E785 Hyperlipidemia, unspecified: Secondary | ICD-10-CM

## 2012-12-07 DIAGNOSIS — I2510 Atherosclerotic heart disease of native coronary artery without angina pectoris: Secondary | ICD-10-CM

## 2012-12-07 DIAGNOSIS — I10 Essential (primary) hypertension: Secondary | ICD-10-CM | POA: Diagnosis not present

## 2012-12-07 MED ORDER — FLUTICASONE PROPIONATE 50 MCG/ACT NA SUSP: 1.0000 | Freq: Every day | NASAL | Status: DC

## 2012-12-07 MED ORDER — MECLIZINE HCL 25 MG PO TABS: ORAL_TABLET | ORAL | Status: DC

## 2012-12-07 NOTE — Progress Notes (Signed)
[address] [place], [place]  [address] Phone: [phone] Fax:  [phone]  Date:  12/07/2012   ID:  Natalie, Bullock [birth year], MRN [id number]  PCP:  Laurena Slimmer, MD  Cardiologist:  Dr. Olga Millers     History of Present Illness: Natalie Bullock is a 79 y.o. female who returns for evaluation of syncope.  She has a hx of CAD, s/p CABG in 2002, syncope, HTN, hypothyroidism, HL, breast CA s/p R mastectomy, DJD. There is a questionable history of vascular dementia. She was admitted in 08/2011 for delirium. Workup was completely normal and included Head CT and Brain MRI. At that time, the notes indicate, she was living at [facility].  Working dx was Alzheimer's dementia.  There was also an admission for syncope thought to be related to orthostatic hypotension. Last Myoview 10/12: No scar or ischemia, EF 74%. Echocardiogram 08/2011: EF 65-70%, moderate MR, moderate PI. She was evaluated by Norma Fredrickson, NP in 9/13 for recurrent syncope. Event monitor demonstrated no significant arrhythmias. In light of extensive w/u prior to this, no further testing was performed.  Last seen by Dr. Jens Som 05/2012. He recommended consideration for ILR if she had recurrent events. Patient was apparently seen in the emergency room 12/05/12 with syncope. Notes indicate this occurred in the setting of having a bowel movement. Head CT demonstrated atrophy and small vessel changes but no acute findings. Neck CT was negative for acute findings. Right hip x-ray was also negative for acute findings. Notes indicate she received IVFs (1.5 L). Orthostatic vital signs were negative. Labs were unremarkable. Notes indicate that she was seen by Dr. Mayford Knife. No consult note is in the system. It was recommended that the patient follow up here.  Patient has apparently had more episodes of dizziness. She called in today and requested to be seen sooner. She tells me that on the night she went to the  emergency room, she went to the bathroom and had a bowel movement. When she got up to go back to her bedroom, she lost her balance and fell to the floor. She remembers everything. This was not clearly syncope. Other episodes of dizziness are described as spinning. We performed orthostatic vital signs today. When she went to go from sitting to lying on the table, she became very dizzy and had to sit back up. She walks with a walker. She has done so for a long time. She denies any nausea associated with her dizziness. She denies any recent chest discomfort or significant dyspnea. She denies palpitations. She denies orthopnea or PND. She does have mild ankle edema.  Labs (7/14):  K 4, Cr 0.7, Hgb 12.2, TnI neg, ALP 56, AST 20, ALT 11, TP 8, Alb 3.9  Wt Readings from Last 3 Encounters:  12/07/12 139 lb (63.05 kg)  05/19/12 106 lb (48.081 kg)  03/23/12 135 lb (61.236 kg)     Past Medical History  Diagnosis Date  . IHD (ischemic heart disease)     with CABG in August 2002  . Hypertension   . Hypothyroidism   . Vitamin D deficiency   . Hyperlipidemia   . Anemia   . Hx of CABG     pt states she had 5 bypasses  . Breast cancer 1975    Right sided mastectomy  . Coronary artery disease   . Myocardial infarction     Non Q-wave MI  . Arthritis     osteoarthritis  .  H/O hypokalemia     Past Surgical History  Procedure Laterality Date  . Coronary artery bypass graft  12/2000    LEFT INTERNAL MAMMARY TO THE LAD, SAPHENOUS VEIN GRAFT TO THE FIRST DIAGONAL, SAPHENOUS VEIN GRAFT TO THE RAMUS INTERMEDIATE, AND A SEQUENTIAL SAPHENOUS VEIN GRAFT TO THE POSTERIOR DESCENDING AND POSTERIOR LATERAL BRANCHES.  . Mastectomy  1975    RIGHT SIDE  . Cardiovascular stress test  Aug 2010    Normal; EF 74%  . Cardiac catheterization  01/20/2001    NORMAL. EF 60-70%  . Tubal ligation      1960's  . Breast surgery      left breast cyst surgery   . Mass excision  04/08/2011    Procedure: EXCISION MASS;   Surgeon: Nicki Reaper, MD;  Location: [facility];  Service: Orthopedics;  Laterality: Left;  excision mass left palm      Current Outpatient Prescriptions  Medication Sig Dispense Refill  . amLODipine (NORVASC) 10 MG tablet Take 1 tablet (10 mg total) by mouth daily.      Marland Kitchen aspirin 81 MG tablet Take 81 mg by mouth daily.       . feeding supplement (ENSURE COMPLETE) LIQD Take 237 mLs by mouth 2 (two) times daily between meals.      Marland Kitchen levothyroxine (SYNTHROID, LEVOTHROID) 50 MCG tablet Take 50 mcg by mouth daily before breakfast.       . Polyethyl Glycol-Propyl Glycol (SYSTANE OP) Place 2 drops into both eyes as needed. For eye dryness.      . pravastatin (PRAVACHOL) 40 MG tablet TAKE ONE (1) TABLET EACH DAY  30 tablet  12  . traMADol (ULTRAM) 50 MG tablet Take 50 mg by mouth every 8 (eight) hours as needed. For pain      . triamterene-hydrochlorothiazide (MAXZIDE-25) 37.5-25 MG per tablet Take 1 tablet by mouth daily.       No current facility-administered medications for this visit.    Allergies:    Allergies  Allergen Reactions  . Valium Shortness Of Breath  . Codeine Other (See Comments)    unknown  . Crestor (Rosuvastatin Calcium) Other (See Comments)    unknown  . Doxycycline Other (See Comments)    uknown  . Ezetimibe-Simvastatin Other (See Comments)    unknown  . Lipitor (Atorvastatin Calcium) Other (See Comments)    unknown  . Sulfa Drugs Cross Reactors Other (See Comments)    unknown  . Tequin Other (See Comments)    unknown  . Zocor (Simvastatin) Other (See Comments)    unknown    Social History:  The patient  reports that she quit smoking about 43 years ago. Her smoking use included Cigarettes. She has a 4 pack-year smoking history. She has never used smokeless tobacco. She reports that she does not drink alcohol or use illicit drugs.   ROS:  Please see the history of present illness.   She notes nasal and head congestion.  She has a non-productive  cough.  No melena, hematochezia, hemoptysis, vomiting or diarrhea.   All other systems reviewed and negative.   PHYSICAL EXAM: VS:  BP 150/75  Pulse 77  Ht 5\' 3"  (1.6 m)  Wt 139 lb (63.05 kg)  BMI 24.63 kg/m2  Filed Vitals:   12/07/12 1553 12/07/12 1554 12/07/12 1555 12/07/12 1556  BP: 149/85 146/83 158/82 151/78  Pulse: 80 90 81 80  Height:      Weight:  Well nourished, well developed, in no acute distress HEENT: normal; TMs obscured due to significant cerumen Neck: no JVD at 90 Cardiac:  normal S1, S2; RRR; no obvious murmur Lungs:  clear to auscultation bilaterally, no wheezing, rhonchi or rales Abd: soft, nontender Ext: trace bilateral ankle edema Skin: warm and dry Neuro:  CNs 2-12 intact, no focal abnormalities noted  EKG:  NSR, HR 73, nonspecific ST-T wave changes     ASSESSMENT AND PLAN:  1. Dizziness:  I reviewed the symptoms with the patient several times today as well as her family members. There has been no clear syncope. She has been dizzy. Her symptoms of dizziness are described as spinning at times. She also has a sensation of falling. I have a high suspicion for vertigo. In review of her chart, she had carotid Dopplers done in 08/2011. Vertebral flow was antegrade. MRA of her head and neck also demonstrated no significant obstruction in her carotids or vertebral arteries. Vertebral flow was antegrade.  Recent evaluation in the emergency room on 7/6 demonstrates no acute findings on head CT. She has had some nasal and head congestion. This would coincide with vertigo. However, the description of her dizziness as not classic for BPPV. Orthostatic VS were normal today.  I have recommended that we proceed with referral to neurology first. She may ultimately need referral to ENT or possibly physical therapy. She has had a history of syncope in the past. Recent studies have demonstrated normal LV function. I will arrange an event monitor for now. I am not convinced  that she needs an implantable loop recorder at this point in time. If neurologic workup is completely unrevealing and her event monitor is also unrevealing, we could consider ILR at that time. I will give her a prescription for meclizine when necessary as well as Flonase. 2. CAD: No angina. Continue aspirin and statin. 3. Hypertension: Controlled. 4. Hyperlipidemia: Continue statin. 5. Disposition: Follow up with Dr. Jens Som in 6 weeks.  Signed, Tereso Newcomer, PA-C  12/07/2012 3:53 PM

## 2012-12-07 NOTE — Telephone Encounter (Signed)
lmptcb to advise answer to questions at OV, per [employer]. PAC need to f/u Neuro 1st with possible referral to ENT, but Neuro can make this decision. as far as the pain med question Tramadol, tylenol, nsaid but not on long term use

## 2012-12-07 NOTE — Patient Instructions (Addendum)
You have been referred to [facility] NEUROLOGY WITH THE 1ST AVAILABLE NEW PT APPT. DX DIZZINESS  EVENT MONITOR NEEDED  DX DIZZINESS  START FLONASE NASAL SPRY FOLLOW DIRECTIONS ON BOTTLE START MECLIZINE 25 MG TABLET; TAKE 1/2 TO 1 WHOLE TABLET TWICE DAILY AS NEEDED FOR DIZZINESS  PLEASE FOLLOW UP WITH DR. CRENSHAW IN 6 WEEKS     Cardiac Diet This diet can help prevent heart disease and stroke. Many factors influence your heart health, including eating and exercise habits. Coronary risk rises a lot with abnormal blood fat (lipid) levels. Cardiac meal planning includes limiting unhealthy fats, increasing healthy fats, and making other small dietary changes. General guidelines are as follows:  Adjust calorie intake to reach and maintain desirable body weight.  Limit total fat intake to less than 30% of total calories. Saturated fat should be less than 7% of calories.  Saturated fats are found in animal products and in some vegetable products. Saturated vegetable fats are found in coconut oil, cocoa butter, palm oil, and palm kernel oil. Read labels carefully to avoid these products as much as possible. Use butter in moderation. Choose tub margarines and oils that have 2 grams of fat or less. Good cooking oils are canola and olive oils.  Practice low-fat cooking techniques. Do not fry food. Instead, broil, bake, boil, steam, grill, roast on a rack, stir-fry, or microwave it. Other fat reducing suggestions include:  Remove the skin from poultry.  Remove all visible fat from meats.  Skim the fat off stews, soups, and gravies before serving them.  Steam vegetables in water or broth instead of sauting them in fat.  Avoid foods with trans fat (or hydrogenated oils), such as commercially fried foods and commercially baked goods. Commercial shortening and deep-frying fats will contain trans fat.  Increase intake of fruits, vegetables, whole grains, and legumes to replace foods high in  fat.  Increase consumption of nuts, legumes, and seeds to at least 4 servings weekly. One serving of a legume equals  cup, and 1 serving of nuts or seeds equals  cup.  Choose whole grains more often. Have 3 servings per day (a serving is 1 ounce [oz]).  Eat 4 to 5 servings of vegetables per day. A serving of vegetables is 1 cup of raw leafy vegetables;  cup of raw or cooked cut-up vegetables;  cup of vegetable juice.  Eat 4 to 5 servings of fruit per day. A serving of fruit is 1 medium whole fruit;  cup of dried fruit;  cup of fresh, frozen, or canned fruit;  cup of 100% fruit juice.  Increase your intake of dietary fiber to 20 to 30 grams per day. Insoluble fiber may help lower your risk of heart disease and may help curb your appetite.  Soluble fiber binds cholesterol to be removed from the blood. Foods high in soluble fiber are dried beans, citrus fruits, oats, apples, bananas, broccoli, Brussels sprouts, and eggplant.  Try to include foods fortified with plant sterols or stanols, such as yogurt, breads, juices, or margarines. Choose several fortified foods to achieve a daily intake of 2 to 3 grams of plant sterols or stanols.  Foods with omega-3 fats can help reduce your risk of heart disease. Aim to have a 3.5 oz portion of fatty fish twice per week, such as salmon, mackerel, albacore tuna, sardines, lake trout, or herring. If you wish to take a fish oil supplement, choose one that contains 1 gram of both DHA and EPA.  Limit  processed meats to 2 servings (3 oz portion) weekly.  Limit the sodium in your diet to 1500 milligrams (mg) per day. If you have high blood pressure, talk to a registered dietitian about a DASH (Dietary Approaches to Stop Hypertension) eating plan.  Limit sweets and beverages with added sugar, such as soda, to no more than 5 servings per week. One serving is:   1 tablespoon sugar.  1 tablespoon jelly or jam.   cup sorbet.  1 cup lemonade.   cup  regular soda. CHOOSING FOODS Starches  Allowed: Breads: All kinds (wheat, rye, raisin, white, oatmeal, [place], [place], and English muffin bread). Low-fat rolls: English muffins, frankfurter and hamburger buns, bagels, pita bread, tortillas (not fried). Pancakes, waffles, biscuits, and muffins made with recommended oil.  Avoid: Products made with saturated or trans fats, oils, or whole milk products. Butter rolls, cheese breads, croissants. Commercial doughnuts, muffins, sweet rolls, biscuits, waffles, pancakes, store-bought mixes. Crackers  Allowed: Low-fat crackers and snacks: Animal, graham, rye, saltine (with recommended oil, no lard), oyster, and matzo crackers. Bread sticks, melba toast, rusks, flatbread, pretzels, and light popcorn.  Avoid: High-fat crackers: cheese crackers, butter crackers, and those made with coconut, palm oil, or trans fat (hydrogenated oils). Buttered popcorn. Cereals  Allowed: Hot or cold whole-grain cereals.  Avoid: Cereals containing coconut, hydrogenated vegetable fat, or animal fat. Potatoes / Pasta / Rice  Allowed: All kinds of potatoes, rice, and pasta (such as macaroni, spaghetti, and noodles).  Avoid: Pasta or rice prepared with cream sauce or high-fat cheese. Chow mein noodles, [place] fries. Vegetables  Allowed: All vegetables and vegetable juices.  Avoid: Fried vegetables. Vegetables in cream, butter, or high-fat cheese sauces. Limit coconut. Fruit in cream or custard. Protein  Allowed: Limit your intake of meat, seafood, and poultry to no more than 6 oz (cooked weight) per day. All lean, well-trimmed beef, veal, pork, and lamb. All chicken and [place] without skin. All fish and shellfish. Wild game: wild duck, rabbit, pheasant, and venison. Egg whites or low-cholesterol egg substitutes may be used as desired. Meatless dishes: recipes with dried beans, peas, lentils, and tofu (soybean curd). Seeds and nuts: all seeds and most nuts.  Avoid: Prime  grade and other heavily marbled and fatty meats, such as short ribs, spare ribs, rib eye roast or steak, frankfurters, sausage, bacon, and high-fat luncheon meats, mutton. Caviar. Commercially fried fish. Domestic duck, goose, venison sausage. Organ meats: liver, gizzard, heart, chitterlings, brains, kidney, sweetbreads. Dairy  Allowed: Low-fat cheeses: nonfat or low-fat cottage cheese (1% or 2% fat), cheeses made with part skim milk, such as mozzarella, farmers, string, or ricotta. (Cheeses should be labeled no more than 2 to 6 grams fat per oz.). Skim (or 1%) milk: liquid, powdered, or evaporated. Buttermilk made with low-fat milk. Drinks made with skim or low-fat milk or cocoa. Chocolate milk or cocoa made with skim or low-fat (1%) milk. Nonfat or low-fat yogurt.  Avoid: Whole milk cheeses, including colby, cheddar, [place], [address], [place], [place], [place], [address], [place], and blue. Creamed cottage cheese, cream cheese. Whole milk and whole milk products, including buttermilk or yogurt made from whole milk, drinks made from whole milk. Condensed milk, evaporated whole milk, and 2% milk. Soups and Combination Foods  Allowed: Low-fat low-sodium soups: broth, dehydrated soups, homemade broth, soups with the fat removed, homemade cream soups made with skim or low-fat milk. Low-fat spaghetti, lasagna, chili, and Spanish rice if low-fat ingredients and low-fat cooking techniques are used.  Avoid: Cream soups made  with whole milk, cream, or high-fat cheese. All other soups. Desserts and Sweets  Allowed: Sherbet, fruit ices, gelatins, meringues, and angel food cake. Homemade desserts with recommended fats, oils, and milk products. Jam, jelly, honey, marmalade, sugars, and syrups. Pure sugar candy, such as gum drops, hard candy, jelly beans, marshmallows, mints, and small amounts of dark chocolate.  Avoid: Commercially prepared cakes, pies, cookies, frosting, pudding, or mixes for these products.  Desserts containing whole milk products, chocolate, coconut, lard, palm oil, or palm kernel oil. Ice cream or ice cream drinks. Candy that contains chocolate, coconut, butter, hydrogenated fat, or unknown ingredients. Buttered syrups. Fats and Oils  Allowed: Vegetable oils: safflower, sunflower, corn, soybean, cottonseed, sesame, canola, olive, or peanut. Non-hydrogenated margarines. Salad dressing or mayonnaise: homemade or commercial, made with a recommended oil. Low or nonfat salad dressing or mayonnaise.  Limit added fats and oils to 6 to 8 tsp per day (includes fats used in cooking, baking, salads, and spreads on bread). Remember to count the "hidden fats" in foods.  Avoid: Solid fats and shortenings: butter, lard, salt pork, bacon drippings. Gravy containing meat fat, shortening, or suet. Cocoa butter, coconut. Coconut oil, palm oil, palm kernel oil, or hydrogenated oils: these ingredients are often used in bakery products, nondairy creamers, whipped toppings, candy, and commercially fried foods. Read labels carefully. Salad dressings made of unknown oils, sour cream, or cheese, such as blue cheese and Roquefort. Cream, all kinds: half-and-half, light, heavy, or whipping. Sour cream or cream cheese (even if "light" or low-fat). Nondairy cream substitutes: coffee creamers and sour cream substitutes made with palm, palm kernel, hydrogenated oils, or coconut oil. Beverages  Allowed: Coffee (regular or decaffeinated), tea. Diet carbonated beverages, mineral water. Alcohol: Check with your caregiver. Moderation is recommended.  Avoid: Whole milk, regular sodas, and juice drinks with added sugar. Condiments  Allowed: All seasonings and condiments. Cocoa powder. "Cream" sauces made with recommended ingredients.  Avoid: Carob powder made with hydrogenated fats. SAMPLE MENU Breakfast   cup orange juice   cup oatmeal  1 slice toast  1 tsp margarine  1 cup skim milk Lunch  [place]  sandwich with 2 oz [place], 2 slices bread  Lettuce and tomato slices  Fresh fruit  Carrot sticks  Coffee or tea Snack  Fresh fruit or low-fat crackers Dinner  3 oz lean ground beef  1 baked potato  1 tsp margarine   cup asparagus  Lettuce salad  1 tbs non-creamy dressing   cup peach slices  1 cup skim milk Document Released: 02/26/2008 Document Revised: 11/18/2011 Document Reviewed: 08/12/2011 [employer] Patient Information 2014 [place], [place].

## 2012-12-07 NOTE — Telephone Encounter (Signed)
New problem  Loraine Leriche called this morning stating that Natalie Bullock was experiencing the dizziness again. He wants to know if there is anyway that she can be admitted to the hospital. He said pt lives alone and he feels it is unsafe for her to be at home with the dizziness going on.

## 2012-12-07 NOTE — Telephone Encounter (Signed)
Pt will be seen today at 3 pm by scott weaver pac.

## 2012-12-08 ENCOUNTER — Ambulatory Visit: Payer: Medicare Other | Admitting: Physician Assistant

## 2012-12-08 NOTE — Telephone Encounter (Signed)
pt advised per Bing Neighbors. PA to see Neuro 1st before needing ENT visit. pt advised to call PCP about pain meds per Bing Neighbors. PA..pt verbalized understanding to me today

## 2012-12-10 ENCOUNTER — Telehealth: Payer: Self-pay | Admitting: Cardiology

## 2012-12-10 NOTE — Telephone Encounter (Signed)
New Prob     Calling & following up on a  referral for ear, nose and throat. States he was previously speaking to [employer] about this matter.

## 2012-12-10 NOTE — Telephone Encounter (Signed)
Unable to leave Natalie Bullock a message voicemail full. Patient was referred to [facility] Neuro not ENT. Patient has an appointment with Dr. Everlena Cooper at [facility] Neuro located at [address] [facility] [address] [phone]). Left message on son cell phone. Waiting on patient to return my call.

## 2012-12-13 NOTE — Telephone Encounter (Signed)
Spoke with mark this morning. Patient is aware of appointment with Dr. Everlena Cooper on 12/22/12 at 1 pm.

## 2012-12-22 ENCOUNTER — Ambulatory Visit: Payer: Medicare Other | Admitting: Neurology

## 2012-12-22 ENCOUNTER — Ambulatory Visit: Payer: Medicare Other | Admitting: Nurse Practitioner

## 2013-01-04 ENCOUNTER — Ambulatory Visit (INDEPENDENT_AMBULATORY_CARE_PROVIDER_SITE_OTHER): Payer: Medicare Other | Admitting: Neurology

## 2013-01-04 ENCOUNTER — Encounter: Payer: Self-pay | Admitting: Neurology

## 2013-01-04 VITALS — BP 138/70 | HR 78 | Temp 98.4°F | Wt 140.0 lb

## 2013-01-04 DIAGNOSIS — R42 Dizziness and giddiness: Secondary | ICD-10-CM

## 2013-01-04 NOTE — Progress Notes (Signed)
NEUROLOGY CONSULTATION NOTE  Natalie Bullock MRN: [id number] DOB: 1925-02-19   Referring provider: Mr. Alben Spittle Primary care provider: Dr. Chestine Spore  Reason for consult:  Dizziness  HISTORY OF PRESENT ILLNESS: Natalie Bullock is a 79 y.o. female with history of CAD s/p CABG 2002, syncope, HTN, hypothyroidism, breast CA s/p right mastectomy and vascular dementia who presents for evaluation of dizziness.  She is accompanied by her son.  Reports and images were personally reviewed where available.  In March and April 2013, she was admitted to the hospital for syncope and delirium.  MRI of the brain was unremarkable.  MRA of head and neck did not reveal any significant intracranial or extracranial stenosis.  It was suspected that her delirium was sequela of possible dementia.  Syncope was thought to be secondary to orthostatic hypotension.  Since that admission, she has been using a walker to ambulate.  She has continued to have syncopal spells and dizziness. Myoview from 10/12 revealed no scar or ischemia, with EF 74%.  Echo from 3/13 revealed EF 65-70%, with mod MR.  Event monitor was performed, with no significant arrhythmias.  Most recently, she presented to the ED on 12/05/12 for syncope, in the setting of a bowel movement.  CT of head revealed chronic small vessel disease and diffuse atrophy.  Orthostatics were unremarkable.  She was given IVF and discharged.  She describes the dizziness as a spinning sensation that is triggered by change in position or head movement, and lasts a couple of minutes.  It often occurs when she first lays her head down in bed at night or when she gets up in the morning.  It also happens when she bends her head back to put in eye drops.  She denies nausea.  She also has nasal congestion.  Her ears feel clogged and she has hearing loss.  Denies tinnitus.  She denies lightheadedness.  She says these spells are separate from her syncopal spells.  She denies focal weakness,  numbness, or visual changes.  PAST MEDICAL HISTORY: Past Medical History  Diagnosis Date  . IHD (ischemic heart disease)     with CABG in August 2002  . Hypertension   . Hypothyroidism   . Vitamin D deficiency   . Hyperlipidemia   . Anemia   . Hx of CABG     pt states she had 5 bypasses  . Breast cancer 1975    Right sided mastectomy  . Coronary artery disease   . Myocardial infarction     Non Q-wave MI  . Arthritis     osteoarthritis  . H/O hypokalemia     PAST SURGICAL HISTORY: Past Surgical History  Procedure Laterality Date  . Coronary artery bypass graft  12/2000    LEFT INTERNAL MAMMARY TO THE LAD, SAPHENOUS VEIN GRAFT TO THE FIRST DIAGONAL, SAPHENOUS VEIN GRAFT TO THE RAMUS INTERMEDIATE, AND A SEQUENTIAL SAPHENOUS VEIN GRAFT TO THE POSTERIOR DESCENDING AND POSTERIOR LATERAL BRANCHES.  . Mastectomy  1975    RIGHT SIDE  . Cardiovascular stress test  Aug 2010    Normal; EF 74%  . Cardiac catheterization  01/20/2001    NORMAL. EF 60-70%  . Tubal ligation      1960's  . Breast surgery      left breast cyst surgery   . Mass excision  04/08/2011    Procedure: EXCISION MASS;  Surgeon: Nicki Reaper, MD;  Location: [facility];  Service: Orthopedics;  Laterality: Left;  excision mass  left palm    MEDICATIONS: Current Outpatient Prescriptions on File Prior to Visit  Medication Sig Dispense Refill  . aspirin 81 MG tablet Take 81 mg by mouth daily.       . feeding supplement (ENSURE COMPLETE) LIQD Take 237 mLs by mouth 2 (two) times daily between meals.      . fluticasone (FLONASE) 50 MCG/ACT nasal spray Place 1 spray into the nose daily.  16 g  1  . levothyroxine (SYNTHROID, LEVOTHROID) 50 MCG tablet Take 50 mcg by mouth daily before breakfast.       . meclizine (MEDI-MECLIZINE) 25 MG tablet TAKE 1/2 TO 1 WHOLE TABLET TWICE DAILY AS NEEDED FOR DIZZINESS  20 tablet  1  . Polyethyl Glycol-Propyl Glycol (SYSTANE OP) Place 2 drops into both eyes as needed. For eye  dryness.      . pravastatin (PRAVACHOL) 40 MG tablet TAKE ONE (1) TABLET EACH DAY  30 tablet  12  . traMADol (ULTRAM) 50 MG tablet Take 50 mg by mouth every 8 (eight) hours as needed. For pain      . triamterene-hydrochlorothiazide (MAXZIDE-25) 37.5-25 MG per tablet Take 1 tablet by mouth daily.      Marland Kitchen amLODipine (NORVASC) 10 MG tablet Take 1 tablet (10 mg total) by mouth daily.       No current facility-administered medications on file prior to visit.    ALLERGIES: Allergies  Allergen Reactions  . Valium Shortness Of Breath  . Codeine Other (See Comments)    unknown  . Crestor (Rosuvastatin Calcium) Other (See Comments)    unknown  . Doxycycline Other (See Comments)    uknown  . Ezetimibe-Simvastatin Other (See Comments)    unknown  . Lipitor (Atorvastatin Calcium) Other (See Comments)    unknown  . Sulfa Drugs Cross Reactors Other (See Comments)    unknown  . Tequin Other (See Comments)    unknown  . Zocor (Simvastatin) Other (See Comments)    unknown    FAMILY HISTORY: Family History  Problem Relation Age of Onset  . Alzheimer's disease Mother     SOCIAL HISTORY: History   Social History  . Marital Status: Widowed    Spouse Name: N/A    Number of Children: N/A  . Years of Education: N/A   Occupational History  . Not on file.   Social History Main Topics  . Smoking status: Former Smoker -- 1.00 packs/day for 4 years    Types: Cigarettes    Quit date: 06/02/1969  . Smokeless tobacco: Never Used  . Alcohol Use: No  . Drug Use: No  . Sexually Active: No   Other Topics Concern  . Not on file   Social History Narrative  . No narrative on file    REVIEW OF SYSTEMS: Constitutional: No fevers, chills, or sweats, no generalized fatigue, change in appetite Eyes: No visual changes, double vision, eye pain Ear, nose and throat: No hearing loss, ear pain, nasal congestion, sore throat Cardiovascular: No chest pain, palpitations Respiratory:  No shortness of  breath at rest or with exertion, wheezes GastrointestinaI: No nausea, vomiting, diarrhea, abdominal pain, fecal incontinence Genitourinary:  No dysuria, urinary retention or frequency Musculoskeletal:  Leg pain Integumentary: No rash, pruritus, skin lesions Neurological: as above Psychiatric: No depression, insomnia, anxiety Endocrine: No palpitations, fatigue, diaphoresis, mood swings, change in appetite, change in weight, increased thirst Hematologic/Lymphatic:  No anemia, purpura, petechiae. Allergic/Immunologic: no itchy/runny eyes, nasal congestion, recent allergic reactions, rashes  PHYSICAL EXAM: Filed Vitals:  01/04/13 1024  BP: 138/70  Pulse: 78  Temp: 98.4 F (36.9 C)   General: No acute distress Head:  Normocephalic/atraumatic Neck: supple, no paraspinal tenderness, full range of motion Back: No paraspinal tenderness Heart: regular rate and rhythm Lungs: Clear to auscultation bilaterally. Vascular: No carotid bruits, mild edema in legs. Neurological Exam: Mental status: alert and oriented to person, place, time and self, speech fluent and not dysarthric, language intact. Cranial nerves: CN I: not tested CN II: pupils equal, round and reactive to light, visual fields intact, fundi not visualized. CN III, IV, VI:  full range of motion, no nystagmus, no ptosis CN V: facial sensation intact CN VII: upper and lower face symmetric CN VIII: hearing reduced bilaterally CN IX, X: gag intact, uvula midline CN XI: sternocleidomastoid and trapezius muscles intact CN XII: tongue midline Bulk & Tone: normal, no fasciculations. Motor: 5/5 throughout Sensation: mildly reduced pinprick sensation in the feet.  Vibration intact Deep Tendon Reflexes: 2+ and symmetric in upper extremities, 1+ in patellars, absent in ankles, toes down Finger to nose testing: without dysmetria Gait: wide-based with short steps without walker. Romberg negative.  IMPRESSION & PLAN: Natalie Bullock is a  79 y.o. female with: 1.  Dizziness: it seems like an inner ear dysfunction, such as benign positional vertigo.  - Refer to ENT  - Take meclizine sparingly and reserve for only severe cases 2.  Syncope:  Etiology unknown but at least one spell was vasovagal.  It does not seem neurologic.  She has already had extensive neurologic workup over the past year since symptoms started.  No further neurologic workup warranted.  45 minutes spent with patient and her son, over 50% spent reviewing chart, images, counseling and coordinating care.  Thank you for allowing me to take part in the care of this patient.  Shon Millet, DO  CC:  Margaretmary Bayley, MD  Tereso Newcomer, PA-C

## 2013-01-04 NOTE — Patient Instructions (Addendum)
I think the dizziness is due to an inner ear dysfunction (such as benign positional vertigo). 1. Referral to ear, nose and throat. 2.  Use the meclizine for only if you really need it  I will refer you to [facility] ENT located at [address] in [place]. They will call you to schedule the appointment.   [phone]

## 2013-01-04 NOTE — Progress Notes (Deleted)
Natalie Bullock is a 79 y.o. female with history of CAD s/p CABG 2002, syncope, HTN, hypothyroidism, breast CA s/p right mastectomy and vascular dementia who presents for evaluation of syncope and dizziness.  Reports and images were personally reviewed where available.  In March and April 2013, she was admitted to the hospital for syncope and delirium.  MRI of the brain was unremarkable.  MRA of head and neck did not reveal any significant intracranial or extracranial stenosis.  It was suspected that her delirium was sequela of possible dementia.  Syncope was thought to be secondary to orthostatic hypotension.  She has continued to have syncopal spells and dizziness. Myoview from 10/12 revealed no scar or ischemia, with EF 74%.  Echo from 3/13 revealed EF 65-70%, with mod MR.  Event monitor was performed, with no significant arrhythmias.  Most recently, she presented to the ED on 12/05/12 for syncope, in the setting of a bowel movement.  CT of head revealed chronic small vessel disease and diffuse atrophy.  Orthostatics were unremarkable.  She was given IVF and discharged.

## 2013-01-11 DIAGNOSIS — H9110 Presbycusis, unspecified ear: Secondary | ICD-10-CM | POA: Diagnosis not present

## 2013-01-11 DIAGNOSIS — R42 Dizziness and giddiness: Secondary | ICD-10-CM | POA: Diagnosis not present

## 2013-01-11 DIAGNOSIS — H6120 Impacted cerumen, unspecified ear: Secondary | ICD-10-CM | POA: Diagnosis not present

## 2013-01-11 DIAGNOSIS — I10 Essential (primary) hypertension: Secondary | ICD-10-CM | POA: Diagnosis not present

## 2013-01-28 DIAGNOSIS — H2510 Age-related nuclear cataract, unspecified eye: Secondary | ICD-10-CM | POA: Diagnosis not present

## 2013-02-07 DIAGNOSIS — E039 Hypothyroidism, unspecified: Secondary | ICD-10-CM | POA: Diagnosis not present

## 2013-02-07 DIAGNOSIS — E7800 Pure hypercholesterolemia, unspecified: Secondary | ICD-10-CM | POA: Diagnosis not present

## 2013-02-07 DIAGNOSIS — I10 Essential (primary) hypertension: Secondary | ICD-10-CM | POA: Diagnosis not present

## 2013-02-07 DIAGNOSIS — I2510 Atherosclerotic heart disease of native coronary artery without angina pectoris: Secondary | ICD-10-CM | POA: Diagnosis not present

## 2013-02-11 ENCOUNTER — Other Ambulatory Visit: Payer: Self-pay

## 2013-02-11 MED ORDER — PRAVASTATIN SODIUM 40 MG PO TABS: ORAL_TABLET | ORAL | Status: DC

## 2013-02-11 NOTE — Telephone Encounter (Signed)
33

## 2013-02-15 ENCOUNTER — Ambulatory Visit (INDEPENDENT_AMBULATORY_CARE_PROVIDER_SITE_OTHER): Payer: Medicare Other | Admitting: Cardiology

## 2013-02-15 ENCOUNTER — Encounter: Payer: Self-pay | Admitting: Cardiology

## 2013-02-15 VITALS — BP 132/60 | HR 68 | Ht 63.0 in | Wt 135.4 lb

## 2013-02-15 DIAGNOSIS — I2510 Atherosclerotic heart disease of native coronary artery without angina pectoris: Secondary | ICD-10-CM | POA: Diagnosis not present

## 2013-02-15 DIAGNOSIS — E785 Hyperlipidemia, unspecified: Secondary | ICD-10-CM

## 2013-02-15 DIAGNOSIS — I10 Essential (primary) hypertension: Secondary | ICD-10-CM | POA: Diagnosis not present

## 2013-02-15 DIAGNOSIS — R55 Syncope and collapse: Secondary | ICD-10-CM | POA: Diagnosis not present

## 2013-02-15 NOTE — Patient Instructions (Addendum)
Your physician wants you to follow-up in: 6 MONTHS WITH DR CRENSHAW You will receive a reminder letter in the mail two months in advance. If you don't receive a letter, please call our office to schedule the follow-up appointment.  

## 2013-02-15 NOTE — Assessment & Plan Note (Signed)
No further episodes. Dizziness resolved after seeing ENT and having her ears cleaned. She most likely had vertigo and also has a history of atrial syncope.

## 2013-02-15 NOTE — Assessment & Plan Note (Signed)
Blood pressure controlled. Continue present medications. 

## 2013-02-15 NOTE — Assessment & Plan Note (Signed)
Continue aspirin and statin. 

## 2013-02-15 NOTE — Assessment & Plan Note (Signed)
Continue statin. Lipids and liver monitored by primary care. 

## 2013-02-15 NOTE — Progress Notes (Signed)
HPI: FU CAD and syncope. She has h/o CABG in 2002, syncope, HTN. There is a questionable history of vascular dementia. She was admitted in 08/2011 for delirium. Workup was completely normal and included Head CT and Brain MRI. At that time, the notes indicate, she was living at [facility]. Working dx was Alzheimer's dementia. There was also an admission for syncope thought to be related to orthostatic hypotension. Last Myoview 10/12: No scar or ischemia, EF 74%. Echocardiogram 08/2011: EF 65-70%, moderate MR, moderate PI. Event monitor 9/13 demonstrated no significant arrhythmias. Patient was apparently seen in the emergency room 12/05/12 with syncope. Notes indicate this occurred in the setting of having a bowel movement. Head CT demonstrated atrophy and small vessel changes but no acute findings. Neck CT was negative for acute findings. Right hip x-ray was also negative for acute findings. Notes indicate she received IVFs (1.5 L). Orthostatic vital signs were negative. Labs were unremarkable. Seen by Tereso Newcomer in followup. He felt as though she was having vertigo. Patient was referred to neurology and an ENT. Since that time, there is no dyspnea, chest pain or syncope.   Current Outpatient Prescriptions  Medication Sig Dispense Refill  . amLODipine (NORVASC) 10 MG tablet Take 1 tablet (10 mg total) by mouth daily.      Marland Kitchen aspirin 81 MG tablet Take 81 mg by mouth daily.       . feeding supplement (ENSURE COMPLETE) LIQD Take 237 mLs by mouth 2 (two) times daily between meals.      . fluticasone (FLONASE) 50 MCG/ACT nasal spray Place 1 spray into the nose daily.  16 g  1  . levothyroxine (SYNTHROID, LEVOTHROID) 50 MCG tablet Take 50 mcg by mouth daily before breakfast.       . meclizine (MEDI-MECLIZINE) 25 MG tablet TAKE 1/2 TO 1 WHOLE TABLET TWICE DAILY AS NEEDED FOR DIZZINESS  20 tablet  1  . Polyethyl Glycol-Propyl Glycol (SYSTANE OP) Place 2 drops into both eyes as needed. For eye dryness.        . pravastatin (PRAVACHOL) 40 MG tablet TAKE ONE (1) TABLET EACH DAY  90 tablet  2  . traMADol (ULTRAM) 50 MG tablet Take 50 mg by mouth every 8 (eight) hours as needed. For pain      . triamterene-hydrochlorothiazide (MAXZIDE-25) 37.5-25 MG per tablet Take 1 tablet by mouth daily.       No current facility-administered medications for this visit.     Past Medical History  Diagnosis Date  . IHD (ischemic heart disease)     with CABG in August 2002  . Hypertension   . Hypothyroidism   . Vitamin D deficiency   . Hyperlipidemia   . Anemia   . Hx of CABG     pt states she had 5 bypasses  . Breast cancer 1975    Right sided mastectomy  . Coronary artery disease   . Myocardial infarction     Non Q-wave MI  . Arthritis     osteoarthritis  . H/O hypokalemia     Past Surgical History  Procedure Laterality Date  . Coronary artery bypass graft  12/2000    LEFT INTERNAL MAMMARY TO THE LAD, SAPHENOUS VEIN GRAFT TO THE FIRST DIAGONAL, SAPHENOUS VEIN GRAFT TO THE RAMUS INTERMEDIATE, AND A SEQUENTIAL SAPHENOUS VEIN GRAFT TO THE POSTERIOR DESCENDING AND POSTERIOR LATERAL BRANCHES.  . Mastectomy  1975    RIGHT SIDE  . Cardiovascular stress test  Aug 2010  Normal; EF 74%  . Cardiac catheterization  01/20/2001    NORMAL. EF 60-70%  . Tubal ligation      1960's  . Breast surgery      left breast cyst surgery   . Mass excision  04/08/2011    Procedure: EXCISION MASS;  Surgeon: Nicki Reaper, MD;  Location: [facility];  Service: Orthopedics;  Laterality: Left;  excision mass left palm    History   Social History  . Marital Status: Widowed    Spouse Name: N/A    Number of Children: N/A  . Years of Education: N/A   Occupational History  . Not on file.   Social History Main Topics  . Smoking status: Former Smoker -- 1.00 packs/day for 4 years    Types: Cigarettes    Quit date: 06/02/1969  . Smokeless tobacco: Never Used  . Alcohol Use: No  . Drug Use: No  . Sexual  Activity: No   Other Topics Concern  . Not on file   Social History Narrative  . No narrative on file    ROS: no fevers or chills, productive cough, hemoptysis, dysphasia, odynophagia, melena, hematochezia, dysuria, hematuria, rash, seizure activity, orthopnea, PND, pedal edema, claudication. Remaining systems are negative.  Physical Exam: Well-developed well-nourished in no acute distress.  Skin is warm and dry.  HEENT is normal.  Neck is supple.  Chest is clear to auscultation with normal expansion. Status post sternotomy Cardiovascular exam is regular rate and rhythm.  Abdominal exam nontender or distended. No masses palpated. Extremities show trace edema. neuro grossly intact  ECG sinus rhythm, low voltage, nonspecific ST changes.

## 2013-02-19 DIAGNOSIS — Z23 Encounter for immunization: Secondary | ICD-10-CM | POA: Diagnosis not present

## 2013-03-26 ENCOUNTER — Encounter (HOSPITAL_COMMUNITY): Payer: Self-pay | Admitting: Emergency Medicine

## 2013-03-26 ENCOUNTER — Emergency Department (HOSPITAL_COMMUNITY): Payer: Medicare Other

## 2013-03-26 ENCOUNTER — Observation Stay (HOSPITAL_COMMUNITY)
Admission: EM | Admit: 2013-03-26 | Discharge: 2013-03-27 | Disposition: A | Discharge status: Home / Self Care | Payer: Medicare Other | Attending: Infectious Diseases | Admitting: Infectious Diseases

## 2013-03-26 ENCOUNTER — Observation Stay (HOSPITAL_COMMUNITY): Payer: Medicare Other

## 2013-03-26 DIAGNOSIS — Z043 Encounter for examination and observation following other accident: Secondary | ICD-10-CM | POA: Diagnosis not present

## 2013-03-26 DIAGNOSIS — E785 Hyperlipidemia, unspecified: Secondary | ICD-10-CM | POA: Diagnosis not present

## 2013-03-26 DIAGNOSIS — I10 Essential (primary) hypertension: Secondary | ICD-10-CM | POA: Diagnosis not present

## 2013-03-26 DIAGNOSIS — I259 Chronic ischemic heart disease, unspecified: Secondary | ICD-10-CM | POA: Diagnosis not present

## 2013-03-26 DIAGNOSIS — E039 Hypothyroidism, unspecified: Secondary | ICD-10-CM | POA: Insufficient documentation

## 2013-03-26 DIAGNOSIS — G909 Disorder of the autonomic nervous system, unspecified: Secondary | ICD-10-CM | POA: Diagnosis not present

## 2013-03-26 DIAGNOSIS — R42 Dizziness and giddiness: Secondary | ICD-10-CM | POA: Insufficient documentation

## 2013-03-26 DIAGNOSIS — I951 Orthostatic hypotension: Secondary | ICD-10-CM | POA: Diagnosis not present

## 2013-03-26 DIAGNOSIS — D649 Anemia, unspecified: Secondary | ICD-10-CM | POA: Diagnosis not present

## 2013-03-26 DIAGNOSIS — Z79899 Other long term (current) drug therapy: Secondary | ICD-10-CM | POA: Diagnosis not present

## 2013-03-26 DIAGNOSIS — I495 Sick sinus syndrome: Secondary | ICD-10-CM | POA: Diagnosis not present

## 2013-03-26 DIAGNOSIS — Z888 Allergy status to other drugs, medicaments and biological substances status: Secondary | ICD-10-CM | POA: Diagnosis not present

## 2013-03-26 DIAGNOSIS — R001 Bradycardia, unspecified: Secondary | ICD-10-CM

## 2013-03-26 DIAGNOSIS — Z7982 Long term (current) use of aspirin: Secondary | ICD-10-CM | POA: Diagnosis not present

## 2013-03-26 DIAGNOSIS — M1990 Unspecified osteoarthritis, unspecified site: Secondary | ICD-10-CM | POA: Insufficient documentation

## 2013-03-26 DIAGNOSIS — W19XXXA Unspecified fall, initial encounter: Secondary | ICD-10-CM

## 2013-03-26 DIAGNOSIS — Z136 Encounter for screening for cardiovascular disorders: Secondary | ICD-10-CM | POA: Diagnosis not present

## 2013-03-26 DIAGNOSIS — Z9861 Coronary angioplasty status: Secondary | ICD-10-CM | POA: Diagnosis not present

## 2013-03-26 DIAGNOSIS — Z853 Personal history of malignant neoplasm of breast: Secondary | ICD-10-CM | POA: Insufficient documentation

## 2013-03-26 DIAGNOSIS — I498 Other specified cardiac arrhythmias: Secondary | ICD-10-CM | POA: Diagnosis not present

## 2013-03-26 DIAGNOSIS — E876 Hypokalemia: Secondary | ICD-10-CM | POA: Insufficient documentation

## 2013-03-26 DIAGNOSIS — E559 Vitamin D deficiency, unspecified: Secondary | ICD-10-CM | POA: Diagnosis not present

## 2013-03-26 DIAGNOSIS — S298XXA Other specified injuries of thorax, initial encounter: Secondary | ICD-10-CM | POA: Diagnosis not present

## 2013-03-26 DIAGNOSIS — Y939 Activity, unspecified: Secondary | ICD-10-CM | POA: Insufficient documentation

## 2013-03-26 DIAGNOSIS — I2510 Atherosclerotic heart disease of native coronary artery without angina pectoris: Secondary | ICD-10-CM | POA: Insufficient documentation

## 2013-03-26 DIAGNOSIS — R404 Transient alteration of awareness: Secondary | ICD-10-CM | POA: Diagnosis not present

## 2013-03-26 DIAGNOSIS — Z87891 Personal history of nicotine dependence: Secondary | ICD-10-CM | POA: Diagnosis not present

## 2013-03-26 DIAGNOSIS — R55 Syncope and collapse: Secondary | ICD-10-CM | POA: Diagnosis present

## 2013-03-26 DIAGNOSIS — S0990XA Unspecified injury of head, initial encounter: Secondary | ICD-10-CM | POA: Diagnosis not present

## 2013-03-26 DIAGNOSIS — I252 Old myocardial infarction: Secondary | ICD-10-CM | POA: Insufficient documentation

## 2013-03-26 DIAGNOSIS — R079 Chest pain, unspecified: Secondary | ICD-10-CM | POA: Diagnosis not present

## 2013-03-26 DIAGNOSIS — Y9289 Other specified places as the place of occurrence of the external cause: Secondary | ICD-10-CM | POA: Insufficient documentation

## 2013-03-26 DIAGNOSIS — Z951 Presence of aortocoronary bypass graft: Secondary | ICD-10-CM | POA: Diagnosis not present

## 2013-03-26 DIAGNOSIS — R296 Repeated falls: Secondary | ICD-10-CM | POA: Insufficient documentation

## 2013-03-26 DIAGNOSIS — F603 Borderline personality disorder: Secondary | ICD-10-CM | POA: Diagnosis not present

## 2013-03-26 LAB — LIPID PANEL
Cholesterol: 125 mg/dL (ref 0–200)
HDL: 56 mg/dL (ref >39)
Total CHOL/HDL Ratio: 2.2 RATIO
Triglycerides: 57 mg/dL (ref <150)
VLDL: 11 mg/dL (ref 0–40)

## 2013-03-26 LAB — TROPONIN I: Troponin I: <0.3 ng/mL (ref <0.30)

## 2013-03-26 LAB — URINALYSIS, ROUTINE W REFLEX MICROSCOPIC
Glucose, UA: NEGATIVE mg/dL
Hgb urine dipstick: NEGATIVE
Ketones, ur: NEGATIVE mg/dL
Protein, ur: NEGATIVE mg/dL
Urobilinogen, UA: 1 mg/dL (ref 0.0–1.0)

## 2013-03-26 LAB — CREATININE, SERUM
Creatinine, Ser: 0.66 mg/dL (ref 0.50–1.10)
GFR calc Af Amer: >90 mL/min (ref >90)
GFR calc non Af Amer: 78 mL/min — ABNORMAL LOW (ref >90)

## 2013-03-26 LAB — URINE MICROSCOPIC-ADD ON

## 2013-03-26 LAB — CBC
HCT: 37 % (ref 36.0–46.0)
MCHC: 33.8 g/dL (ref 30.0–36.0)
RDW: 14 % (ref 11.5–15.5)
WBC: 10.8 10*3/uL — ABNORMAL HIGH (ref 4.0–10.5)

## 2013-03-26 LAB — BASIC METABOLIC PANEL
BUN: 15 mg/dL (ref 6–23)
Calcium: 10.1 mg/dL (ref 8.4–10.5)
GFR calc Af Amer: 88 mL/min — ABNORMAL LOW (ref >90)
GFR calc non Af Amer: 76 mL/min — ABNORMAL LOW (ref >90)
Glucose, Bld: 101 mg/dL — ABNORMAL HIGH (ref 70–99)
Potassium: 3.3 mEq/L — ABNORMAL LOW (ref 3.5–5.1)
Sodium: 135 mEq/L (ref 135–145)

## 2013-03-26 LAB — HEMOGLOBIN A1C: Mean Plasma Glucose: 111 mg/dL (ref <117)

## 2013-03-26 LAB — HIV ANTIBODY (ROUTINE TESTING W REFLEX): HIV: NONREACTIVE

## 2013-03-26 MED ORDER — ASPIRIN 81 MG PO TABS: 81.0000 mg | ORAL_TABLET | Freq: Every day | ORAL | Status: DC

## 2013-03-26 MED ORDER — SODIUM CHLORIDE 0.9 % IJ SOLN
3.0000 mL | Freq: Two times a day (BID) | INTRAMUSCULAR | Status: DC
  Administered 2013-03-26: 3 mL via INTRAVENOUS

## 2013-03-26 MED ORDER — ENSURE COMPLETE PO LIQD
237.0000 mL | Freq: Two times a day (BID) | ORAL | Status: DC
  Administered 2013-03-26 – 2013-03-27 (×2): 237 mL via ORAL

## 2013-03-26 MED ORDER — ENOXAPARIN SODIUM 40 MG/0.4ML ~~LOC~~ SOLN
40.0000 mg | SUBCUTANEOUS | Status: DC
  Administered 2013-03-26: 40 mg via SUBCUTANEOUS
  Filled 2013-03-26 (×2): qty 0.4

## 2013-03-26 MED ORDER — ASPIRIN 81 MG PO CHEW
324.0000 mg | CHEWABLE_TABLET | Freq: Once | ORAL | Status: AC
  Administered 2013-03-26: 324 mg via ORAL
  Filled 2013-03-26: qty 4

## 2013-03-26 MED ORDER — POTASSIUM CHLORIDE CRYS ER 20 MEQ PO TBCR
40.0000 meq | EXTENDED_RELEASE_TABLET | Freq: Once | ORAL | Status: AC
  Administered 2013-03-26: 40 meq via ORAL
  Filled 2013-03-26: qty 2

## 2013-03-26 MED ORDER — LEVOTHYROXINE SODIUM 50 MCG PO TABS
50.0000 ug | ORAL_TABLET | Freq: Every day | ORAL | Status: DC
  Administered 2013-03-26 – 2013-03-27 (×2): 50 ug via ORAL
  Filled 2013-03-26 (×3): qty 1

## 2013-03-26 MED ORDER — POLYETHYL GLYCOL-PROPYL GLYCOL 0.4-0.3 % OP SOLN: OPHTHALMIC | Status: DC | PRN

## 2013-03-26 MED ORDER — ONDANSETRON HCL 4 MG/2ML IJ SOLN
4.0000 mg | Freq: Once | INTRAMUSCULAR | Status: AC
  Administered 2013-03-26: 4 mg via INTRAVENOUS
  Filled 2013-03-26: qty 2

## 2013-03-26 MED ORDER — ASPIRIN 81 MG PO CHEW
81.0000 mg | CHEWABLE_TABLET | Freq: Every day | ORAL | Status: DC
  Administered 2013-03-27: 81 mg via ORAL
  Filled 2013-03-26: qty 1

## 2013-03-26 MED ORDER — SODIUM CHLORIDE 0.9 % IV SOLN
INTRAVENOUS | Status: AC
  Administered 2013-03-26: 13:00:00 via INTRAVENOUS

## 2013-03-26 MED ORDER — SODIUM CHLORIDE 0.9 % IV SOLN
Freq: Once | INTRAVENOUS | Status: AC
  Administered 2013-03-26: 07:00:00 via INTRAVENOUS

## 2013-03-26 MED ORDER — POLYVINYL ALCOHOL 1.4 % OP SOLN
1.0000 [drp] | OPHTHALMIC | Status: DC | PRN
  Filled 2013-03-26: qty 15

## 2013-03-26 NOTE — ED Provider Notes (Signed)
CSN: [id number]     Arrival date & time 03/26/13  0417 History   First MD Initiated Contact with Patient 03/26/13 0445     Chief Complaint  Patient presents with  . Fall   (Consider location/radiation/quality/duration/timing/severity/associated sxs/prior Treatment) HPI Comments: Pt comes in with cc of fall. Pt has hx of CAD, and frequent falls and Syncope - evaluated by both Cardiology and Neurology this summer -with no causative findings. She comes to the Ed after another episode of fall. Pt was heading to the bathroom, and the next things she recalls was falling. She doesn't think she had a syncope, and she denies hitting her head, having a headache or any nausea, vomiting, visual complains, seizures, loss of consciousness, new weakness, or numbness. Pt denies any chest pain, palpitations, diaphoresis as well. She has no hx of PE, DVT, and is not on any anticoagulants.    Patient is a 79 y.o. female presenting with fall. The history is provided by the patient and medical records.  Fall Pertinent negatives include no chest pain, no abdominal pain, no headaches and no shortness of breath.    Past Medical History  Diagnosis Date  . IHD (ischemic heart disease)     with CABG in August 2002  . Hypertension   . Hypothyroidism   . Vitamin D deficiency   . Hyperlipidemia   . Anemia   . Hx of CABG     pt states she had 5 bypasses  . Breast cancer 1975    Right sided mastectomy  . Coronary artery disease   . Myocardial infarction     Non Q-wave MI  . Arthritis     osteoarthritis  . H/O hypokalemia    Past Surgical History  Procedure Laterality Date  . Coronary artery bypass graft  12/2000    LEFT INTERNAL MAMMARY TO THE LAD, SAPHENOUS VEIN GRAFT TO THE FIRST DIAGONAL, SAPHENOUS VEIN GRAFT TO THE RAMUS INTERMEDIATE, AND A SEQUENTIAL SAPHENOUS VEIN GRAFT TO THE POSTERIOR DESCENDING AND POSTERIOR LATERAL BRANCHES.  . Mastectomy  1975    RIGHT SIDE  . Cardiovascular stress test  Aug  2010    Normal; EF 74%  . Cardiac catheterization  01/20/2001    NORMAL. EF 60-70%  . Tubal ligation      1960's  . Breast surgery      left breast cyst surgery   . Mass excision  04/08/2011    Procedure: EXCISION MASS;  Surgeon: Nicki Reaper, MD;  Location: [facility];  Service: Orthopedics;  Laterality: Left;  excision mass left palm   Family History  Problem Relation Age of Onset  . Alzheimer's disease Mother    History  Substance Use Topics  . Smoking status: Former Smoker -- 1.00 packs/day for 4 years    Types: Cigarettes    Quit date: 06/02/1969  . Smokeless tobacco: Never Used  . Alcohol Use: No   OB History   Grav Para Term Preterm Abortions TAB SAB Ect Mult Living                 Review of Systems  Constitutional: Positive for activity change. Negative for fever and fatigue.  Respiratory: Negative for shortness of breath.   Cardiovascular: Negative for chest pain.  Gastrointestinal: Negative for nausea, vomiting and abdominal pain.  Genitourinary: Negative for dysuria.  Musculoskeletal: Negative for neck pain.  Neurological: Positive for dizziness and light-headedness. Negative for seizures, syncope, speech difficulty, weakness, numbness and headaches.  Allergies  Valium; Codeine; Crestor; Doxycycline; Ezetimibe-simvastatin; Lipitor; Sulfa drugs cross reactors; Tequin; and Zocor  Home Medications   Current Outpatient Rx  Name  Route  Sig  Dispense  Refill  . amLODipine (NORVASC) 10 MG tablet   Oral   Take 1 tablet (10 mg total) by mouth daily.         Marland Kitchen aspirin 81 MG tablet   Oral   Take 81 mg by mouth daily.          . feeding supplement (ENSURE COMPLETE) LIQD   Oral   Take 237 mLs by mouth 2 (two) times daily between meals.         Marland Kitchen levothyroxine (SYNTHROID, LEVOTHROID) 50 MCG tablet   Oral   Take 50 mcg by mouth daily before breakfast.          . Polyethyl Glycol-Propyl Glycol (SYSTANE OP)   Both Eyes   Place 2 drops  into both eyes as needed. For eye dryness.         . pravastatin (PRAVACHOL) 20 MG tablet   Oral   Take 20 mg by mouth daily.         . traMADol (ULTRAM) 50 MG tablet   Oral   Take 50 mg by mouth every 8 (eight) hours as needed. For pain         . triamterene-hydrochlorothiazide (MAXZIDE-25) 37.5-25 MG per tablet   Oral   Take 1 tablet by mouth daily.          BP 131/87  Pulse 70  Resp 18  SpO2 100% Physical Exam  Nursing note and vitals reviewed. Constitutional: She is oriented to person, place, and time. She appears well-developed.  HENT:  Head: Normocephalic and atraumatic.  No midline c-spine tenderness, pt able to turn head to 45 degrees bilaterally without any pain and able to flex neck to the chest and extend without any pain or neurologic symptoms.   Eyes: Conjunctivae and EOM are normal. Pupils are equal, round, and reactive to light.  Neck: Normal range of motion. Neck supple.  Cardiovascular: Normal rate, regular rhythm and normal heart sounds.   Pulmonary/Chest: Effort normal and breath sounds normal. No respiratory distress.  Abdominal: Soft. Bowel sounds are normal. She exhibits no distension. There is no tenderness. There is no rebound and no guarding.  Musculoskeletal: She exhibits no edema.  Head to toe evaluation shows no hematoma, bleeding of the scalp, no facial abrasions, step offs, crepitus, no tenderness to palpation of the bilateral upper and lower extremities, no gross deformities, no chest tenderness, no pelvic pain.   Neurological: She is alert and oriented to person, place, and time. No cranial nerve deficit. Coordination normal.  Skin: Skin is warm and dry.    ED Course  Procedures (including critical care time) Labs Review Labs Reviewed  BASIC METABOLIC PANEL - Abnormal; Notable for the following:    Potassium 3.3 (*)    Glucose, Bld 101 (*)    GFR calc non Af Amer 76 (*)    GFR calc Af Amer 88 (*)    All other components within  normal limits  URINALYSIS, ROUTINE W REFLEX MICROSCOPIC - Abnormal; Notable for the following:    APPearance CLOUDY (*)    Leukocytes, UA TRACE (*)    All other components within normal limits  URINE MICROSCOPIC-ADD ON - Abnormal; Notable for the following:    Bacteria, UA FEW (*)    Casts HYALINE CASTS (*)    All  other components within normal limits  URINE CULTURE  TROPONIN I   Imaging Review No results found.  EKG Interpretation     Ventricular Rate:  81 PR Interval:  231 QRS Duration: 93 QT Interval:  446 QTC Calculation: 518 R Axis:   42 Text Interpretation:  Sinus rhythm Prolonged PR interval Probable left atrial enlargement Borderline T abnormalities, anterior leads Prolonged QT interval - WORSE THAN PRIOR EKG            MDM   1. Orthostatic hypotension   2. Autonomic instability   3. Fall, initial encounter     Date: 03/26/2013  Rate: 48  Rhythm: sinus bradycardia  QRS Axis: left  Intervals: normal  ST/T Wave abnormalities: nonspecific ST/T changes  Conduction Disutrbances:none  Narrative Interpretation:   Old EKG Reviewed: changes noted - HR 48 is lowest in file  DDx includes: Orthostatic hypotension Stroke Vertebral artery dissection/stenosis Dysrhythmia PE Vasovagal/neurocardiogenic syncope Aortic stenosis Valvular disorder/Cardiomyopathy Anemia AAA  Pt comes in with cc of fall. Pt didn't have syncope. Her Musculoskeletal exam is benign. Labs are WNL. Initial EKG showed HR of 48 - with which, while she was in the bed, she was asymtpomatic. However, when we attempted to get her up for orthostatics and ambulation - she suddenly became pale, diaphoretic, and her BP dropped to 60/40. BP checked in my presence, and was still that low. Pt was coherent, just severely nauseated and a little diaphoretic. No chest pain or palpitations. In 3-5 minutes - BP improved and normalized again.  Hydration continued. Will admit, as i dont think it will be  safe to send her home with unsteady pressures and gait.  She had neg Cards and Neuro workup. Will get US aorta. Teaching service to admit.     Derwood Kaplan, MD 03/26/13 [phone]

## 2013-03-26 NOTE — Consult Note (Addendum)
Primary Care Physician: Laurena Slimmer, MD Referring Physician:  Dr Deedra Ehrich is a 79 y.o. female with a h/o recurrent syncope who is admitted after a fall.  She has some dementia and history is therefore limited.  Per her report, she was using the bathroom and in her usual state of health.  She stood up to go back to her room and "fell out".  By this she states that she means that she became weak and fell to the floor.  She tells me that she did not lose consciousness.  She has had previous hospitalizations for similar episodes and also for hypokalemia.  She was previously felt to be dehydrated but remains on maxzide.  In the ER, she was observed to have sinus bradycardia at 48 bpm without any symptoms.  When she stood for orthostatics, she was noted to be quite orthostatic with BP 60/40s.  She was nauseated at the time.  She reports having difficulty with postural dizziness previously.  She is now admitted for further evaluation.  Presently she is comfortable and sleeping.  She is without complaint otherwise.  Past Medical History  Diagnosis Date  . IHD (ischemic heart disease)     with CABG in August 2002  . Hypertension   . Hypothyroidism   . Vitamin D deficiency   . Hyperlipidemia   . Anemia   . Hx of CABG     pt states she had 5 bypasses  . Breast cancer 1975    Right sided mastectomy  . Coronary artery disease   . Myocardial infarction     Non Q-wave MI  . Arthritis     osteoarthritis  . H/O hypokalemia    Past Surgical History  Procedure Laterality Date  . Coronary artery bypass graft  12/2000    LEFT INTERNAL MAMMARY TO THE LAD, SAPHENOUS VEIN GRAFT TO THE FIRST DIAGONAL, SAPHENOUS VEIN GRAFT TO THE RAMUS INTERMEDIATE, AND A SEQUENTIAL SAPHENOUS VEIN GRAFT TO THE POSTERIOR DESCENDING AND POSTERIOR LATERAL BRANCHES.  . Mastectomy  1975    RIGHT SIDE  . Cardiovascular stress test  Aug 2010    Normal; EF 74%  . Cardiac catheterization  01/20/2001    NORMAL. EF  60-70%  . Tubal ligation      1960's  . Breast surgery      left breast cyst surgery   . Mass excision  04/08/2011    Procedure: EXCISION MASS;  Surgeon: Nicki Reaper, MD;  Location: [facility];  Service: Orthopedics;  Laterality: Left;  excision mass left palm    Current Facility-Administered Medications  Medication Dose Route Frequency Provider Last Rate Last Dose  . 0.9 %  sodium chloride infusion   Intravenous Continuous Carlynn Purl, DO 75 mL/hr at 03/26/13 1315    . [START ON 03/27/2013] aspirin chewable tablet 81 mg  81 mg Oral Daily Carlynn Purl, DO      . enoxaparin (LOVENOX) injection 40 mg  40 mg Subcutaneous Q24H Carlynn Purl, DO      . feeding supplement (ENSURE COMPLETE) (ENSURE COMPLETE) liquid 237 mL  237 mL Oral BID BM Carlynn Purl, DO   237 mL at 03/26/13 1400  . levothyroxine (SYNTHROID, LEVOTHROID) tablet 50 mcg  50 mcg Oral QAC breakfast Carlynn Purl, DO   50 mcg at 03/26/13 1404  . polyvinyl alcohol (LIQUIFILM TEARS) 1.4 % ophthalmic solution 1 drop  1 drop Both Eyes PRN Carlynn Purl, DO      .  sodium chloride 0.9 % injection 3 mL  3 mL Intravenous Q12H Carlynn Purl, DO   3 mL at 03/26/13 1315    Allergies  Allergen Reactions  . Valium Shortness Of Breath  . Codeine Other (See Comments)    unknown  . Crestor [Rosuvastatin Calcium] Other (See Comments)    unknown  . Doxycycline Other (See Comments)    uknown  . Ezetimibe-Simvastatin Other (See Comments)    unknown  . Lipitor [Atorvastatin Calcium] Other (See Comments)    unknown  . Sulfa Drugs Cross Reactors Other (See Comments)    unknown  . Tequin Other (See Comments)    unknown  . Zocor [Simvastatin] Other (See Comments)    unknown    History   Social History  . Marital Status: Widowed    Spouse Name: N/A    Number of Children: N/A  . Years of Education: N/A   Occupational History  . Not on file.   Social History Main Topics  . Smoking status: Former Smoker -- 1.00 packs/day  for 4 years    Types: Cigarettes    Quit date: 06/02/1969  . Smokeless tobacco: Never Used  . Alcohol Use: No  . Drug Use: No  . Sexual Activity: No   Other Topics Concern  . Not on file   Social History Narrative  . No narrative on file    Family History  Problem Relation Age of Onset  . Alzheimer's disease Mother     ROS- documented in Dr Luz Brazen note and reviewed  Physical Exam: Filed Vitals:   03/26/13 1030 03/26/13 1100 03/26/13 1230 03/26/13 1309  BP: 122/44 132/50 121/45 120/44  Pulse: 72 63 56 56  Temp:    98.2 F (36.8 C)  TempSrc:    Oral  Resp:    12  SpO2: 95% 99% 98% 97%    GEN- The patient is elderly appearing, sleeping but rouses  Head- normocephalic, atraumatic Eyes-  Sclera clear, conjunctiva pink Ears- hearing intact Oropharynx- clear with dry MM Neck- supple  Lungs- Clear to ausculation bilaterally, normal work of breathing Heart- Regular rate and rhythm, no murmurs, rubs or gallops, PMI not laterally displaced GI- soft, NT, ND, + BS Extremities- no clubbing, cyanosis, + dependant edema MS- no significant deformity or atrophy Skin- no rash or lesion Psych- euthymic mood, full affect Neuro- strength and sensation are intact  EKG today reviewed Tereso Newcomer and Dr Waunita Schooner notes are reviewed Prior admit notes are reviewed  Assessment and Plan:  1. Orthostatic presyncope She appears dry to me.  Dehydration is also suggested by hyaline casts on UA.  I think that maxzide is the likely cause for her recurrent episodes.  She has had trouble with dehydration previously and has also had prior hyponatremia.  Previously, hospital records suggest a plan to hold diuretics however she appears to still be taken them. I would recommend stopping maxzide long term which will certainly predispose the elderly female to postural hypotension.  She has dependant leg edema which would be best treated with support hose.  2. HTN Goal SBP<150. I think that given  her recurrent syncope we should allow her BP to run a little be higher if needed. I think that Maxzide is likely to cause her problems.  Amlodipine may contribute to swelling.  Restart amlodipine only if required for more significantly elevated BP  3. Sinus bradycardia I am not convinced that she is symptomatic with bradycardia and I do not feel that it has  caused her episodes of presyncope.  I would not recommend pacemaker implant for this elderly female without a clear indication.  I did discuss implantable loop recorder with her today.  She is clear that she would like to avoid any procedures.  Given her advanced age, I think that this is very reasonable.  She has already worn an event monitor.  I would not recommed that we repeat this at discharge as the yield will be low.  TSH is pending.  We will treat medically as above.  No further inpatient procedures planned. If she remains stable overnight and is no longer orthostatic, she could likely be discharged in the am with outpatient follow-up.

## 2013-03-26 NOTE — ED Notes (Signed)
MD at bedside. 

## 2013-03-26 NOTE — H&P (Signed)
Date: 03/26/2013               Patient Name:  Natalie Bullock MRN: [id number]  DOB: [birth year] Age / Sex: 79 y.o., female   PCP: Laurena Slimmer, MD         Medical Service: Internal Medicine Teaching Service         Attending Physician: Dr. [name], MD    First Contact: Dr. Mikey Bussing Pager: [phone]  Second Contact: Dr. Virgina Organ Pager: [phone]       After Hours (After 5p/  First Contact Pager: [phone]  weekends / holidays): Second Contact Pager: [phone]   Chief Complaint: Fall  History of Present Illness: Natalie Bullock is a 79 year old female of HTN, CAD s/p CABG in 2002, Hypothyroidism, HLD, and multiple syncopal episodes that have been worked up by Cardiology and Neurologly thought likely atrial syncope.  She presents to the ED today after a fall this morning at her home.  She reports she had gotten up this morning to go to the bathroom.  In the hallway as she was returning to her bed she reports he fell and hit the back of her head,  She denies any loss of consciousness.  She denied any tongue or lip biting, denied any bowel or bladder incontinence, she denied any focal weakness or HA.  She denied any prodromal symptoms including palpitations, dizziness, nausea, ringing in her ears.  She denies any recent illness and reports she was in her normal state of health.  She reports compliance with all of her medications.  In the ED she reports she has had 3 episdoe of loose stools and 1 episode of NBNB nausea and vomiting.  She denies any chest pain/ pressure, or SOB.  Cardiologist: Dr. Jens Som Neurologist: Dr. Everlena Cooper  Meds: No current facility-administered medications for this encounter.   Current Outpatient Prescriptions  Medication Sig Dispense Refill  . amLODipine (NORVASC) 10 MG tablet Take 1 tablet (10 mg total) by mouth daily.      Marland Kitchen aspirin 81 MG tablet Take 81 mg by mouth daily.       . feeding supplement (ENSURE COMPLETE) LIQD Take 237 mLs by mouth 2 (two) times daily  between meals.      Marland Kitchen levothyroxine (SYNTHROID, LEVOTHROID) 50 MCG tablet Take 50 mcg by mouth daily before breakfast.       . Polyethyl Glycol-Propyl Glycol (SYSTANE OP) Place 2 drops into both eyes as needed. For eye dryness.      . pravastatin (PRAVACHOL) 20 MG tablet Take 20 mg by mouth daily.      . traMADol (ULTRAM) 50 MG tablet Take 50 mg by mouth every 8 (eight) hours as needed. For pain      . triamterene-hydrochlorothiazide (MAXZIDE-25) 37.5-25 MG per tablet Take 1 tablet by mouth daily.        Allergies: Allergies as of 03/26/2013 - Review Complete 03/26/2013  Allergen Reaction Noted  . Valium Shortness Of Breath 11/13/2010  . Codeine Other (See Comments) 09/11/2011  . Crestor [rosuvastatin calcium] Other (See Comments) 04/02/2011  . Doxycycline Other (See Comments) 11/13/2010  . Ezetimibe-simvastatin Other (See Comments) 11/13/2010  . Lipitor [atorvastatin calcium] Other (See Comments) 11/13/2010  . Sulfa drugs cross reactors Other (See Comments) 11/13/2010  . Tequin Other (See Comments) 11/13/2010  . Zocor [simvastatin] Other (See Comments) 11/13/2010   Past Medical History  Diagnosis Date  . IHD (ischemic heart disease)     with CABG in August  2002  . Hypertension   . Hypothyroidism   . Vitamin D deficiency   . Hyperlipidemia   . Anemia   . Hx of CABG     pt states she had 5 bypasses  . Breast cancer 1975    Right sided mastectomy  . Coronary artery disease   . Myocardial infarction     Non Q-wave MI  . Arthritis     osteoarthritis  . H/O hypokalemia    Past Surgical History  Procedure Laterality Date  . Coronary artery bypass graft  12/2000    LEFT INTERNAL MAMMARY TO THE LAD, SAPHENOUS VEIN GRAFT TO THE FIRST DIAGONAL, SAPHENOUS VEIN GRAFT TO THE RAMUS INTERMEDIATE, AND A SEQUENTIAL SAPHENOUS VEIN GRAFT TO THE POSTERIOR DESCENDING AND POSTERIOR LATERAL BRANCHES.  . Mastectomy  1975    RIGHT SIDE  . Cardiovascular stress test  Aug 2010    Normal; EF 74%    . Cardiac catheterization  01/20/2001    NORMAL. EF 60-70%  . Tubal ligation      1960's  . Breast surgery      left breast cyst surgery   . Mass excision  04/08/2011    Procedure: EXCISION MASS;  Surgeon: Nicki Reaper, MD;  Location: [facility];  Service: Orthopedics;  Laterality: Left;  excision mass left palm   Family History  Problem Relation Age of Onset  . Alzheimer's disease Mother    History   Social History  . Marital Status: Widowed    Spouse Name: N/A    Number of Children: N/A  . Years of Education: N/A   Occupational History  . Not on file.   Social History Main Topics  . Smoking status: Former Smoker -- 1.00 packs/day for 4 years    Types: Cigarettes    Quit date: 06/02/1969  . Smokeless tobacco: Never Used  . Alcohol Use: No  . Drug Use: No  . Sexual Activity: No   Other Topics Concern  . Not on file   Social History Narrative  . No narrative on file    Review of Systems: Review of Systems  Constitutional: Negative for fever, chills and weight loss.  HENT: Negative for tinnitus.   Eyes: Negative for blurred vision and pain.  Respiratory: Negative for cough, hemoptysis, sputum production, shortness of breath and wheezing.   Cardiovascular: Positive for leg swelling. Negative for chest pain, palpitations, claudication and PND.  Gastrointestinal: Positive for nausea, vomiting and diarrhea (3 loose stools this AM). Negative for abdominal pain, constipation and blood in stool.  Genitourinary: Negative for dysuria, urgency and frequency.  Musculoskeletal: Positive for falls and joint pain (arthritis).  Skin: Negative for rash.  Neurological: Negative for dizziness, tingling, sensory change, speech change, loss of consciousness, weakness and headaches.  Psychiatric/Behavioral: Negative for depression.     Physical Exam: Blood pressure 130/58, pulse 65, temperature 98.2 F (36.8 C), temperature source Oral, resp. rate 17, SpO2  100.00%. Physical Exam  Nursing note and vitals reviewed. Constitutional: She is oriented to person, place, and time and well-developed, well-nourished, and in no distress. No distress.  HENT:  Head: Normocephalic and atraumatic.  Mouth/Throat: No oropharyngeal exudate.  Eyes: Conjunctivae and EOM are normal. Pupils are equal, round, and reactive to light. No scleral icterus.  Neck: Normal range of motion.  Cardiovascular: Normal rate, regular rhythm, normal heart sounds and intact distal pulses.   No murmur heard. Pulmonary/Chest: Effort normal and breath sounds normal. No respiratory distress. She has no  wheezes. She has no rales.  Abdominal: Soft. Bowel sounds are normal.  Musculoskeletal: She exhibits edema (2+ pedal edma B/L.).  Neurological: She is alert and oriented to person, place, and time. She has normal sensation, normal strength, normal reflexes and intact cranial nerves. She displays facial symmetry. No cranial nerve deficit. She exhibits normal muscle tone. She has a normal Finger-Nose-Finger Test and a normal Tandem Gait Test. Coordination normal.  Skin: She is not diaphoretic.     Left Lower extremity cooler to touch, clear blister on left 5th toe.  2+ distal pulses, no hair growth of lower extremity noted b/l.  Psychiatric: Memory normal.     Lab results: Basic Metabolic Panel:  Recent Labs  16/10/96 0555  NA 135  K 3.3*  CL 98  CO2 26  GLUCOSE 101*  BUN 15  CREATININE 0.71  CALCIUM 10.1   Cardiac Enzymes:  Recent Labs  03/26/13 0555  TROPONINI <0.30   Urine Drug Screen: Drugs of Abuse     Component Value Date/Time   LABOPIA NEGATIVE 09/13/2011 0346   COCAINSCRNUR NEGATIVE 09/13/2011 0346   LABBENZ NEGATIVE 09/13/2011 0346   AMPHETMU NEGATIVE 09/13/2011 0346    Urinalysis:  Recent Labs  03/26/13 0624  COLORURINE YELLOW  LABSPEC 1.013  PHURINE 8.0  GLUCOSEU NEGATIVE  HGBUR NEGATIVE  BILIRUBINUR NEGATIVE  KETONESUR NEGATIVE  PROTEINUR  NEGATIVE  UROBILINOGEN 1.0  NITRITE NEGATIVE  LEUKOCYTESUR TRACE*    Imaging results:  US Aorta  03/26/2013   CLINICAL DATA:  Pain post trauma  EXAM: ULTRASOUND OF ABDOMINAL AORTA  TECHNIQUE: Ultrasound examination of the abdominal aorta was performed to evaluate for abdominal aortic aneurysm.  COMPARISON:  CT abdomen and pelvis August 04, 2011  FINDINGS: Longitudinal and transverse images of the abdominal aorta were obtained. The maximum transverse diameter of the aorta is 2.4 x 2.4 cm. The right common and left common iliac arteries both have maximum transverse diameters of 1 x 1 cm. There is no evidence of abdominal aortic aneurysm. There is no periaortic adenopathy or fluid. There is moderate atherosclerotic change in the aorta.  IMPRESSION: Atherosclerotic change but no abdominal aortic aneurysm. There is no periaortic fluid.   Electronically Signed   By: Bretta Bang M.D.   On: 03/26/2013 08:44   Dg Chest Port 1 View  03/26/2013   CLINICAL DATA:  Pain post trauma  EXAM: PORTABLE CHEST - 1 VIEW  COMPARISON:  December 05, 2012  FINDINGS: There is scarring in the left mid lung region, stable. There is no edema or consolidation. Heart is mildly enlarged with normal pulmonary vascularity. Patient is status post coronary artery bypass grafting.  There is atherosclerotic change in aorta. No adenopathy. No pneumothorax. No bone lesions.  IMPRESSION: Scarring left mid lung. No edema or consolidation. Stable cardiac silhouette. No appreciable change from prior study.   Electronically Signed   By: Bretta Bang M.D.   On: 03/26/2013 07:37    Other results: EKG at 5:12 am, Sinus bradycardia with a rate of 48, normal axis, PAC noted. TWI in lead V2 present on previous EKG EKG at 7:07 am, Sinus, rate of 81, normal axis, TWI in lead V2 unchanged from previous EKG.  <ECG>  Assessment & Plan by Problem: #  Near syncope Patient has a history of syncopal episodes for which she has been worked up by Dr.  Jens Som (Cardiology) and Dr Everlena Cooper (Neurology), this workup has included head and neck CT and well as MRI/MRA, an Event Monitor.  Her syncopal episodes in the past have responded to IVF and have been throught likely due to atrial syncope.  Today she reports a fall, but that she did not loose conscienceness at home, no prodromal symptoms.  In the ED she was ambulated and her BP fell to 66/34 with a pulse of 43.  Indicating orthostatic hypotension.  Two EKGs were obtained in the ED the first showed sinus bradycardia with a PAC, the second showed sinus regular rate but with a prolonged QT interval.  [place] of aorta  Showed no aneurysm or periaortic fluid, CXR was unchanged from previous. -Admit patient, Observation, monitor on telemetry - No acute ST or T wave changes noted on EKG, initial CE was negative.  Will cycle 2 additional Troponins given Near Syncope and bradycardia with hypotension to evaluate for inferior MI.  Obtain AM EKG. Give ASA 324mg  now. -IVF NS at 34ml/hr. -Strict Bed rest -No obvious Hematoma on head, no neuro deficit, did have 1 episode of emesis, takes Aspirin and is 79 years old. Will obtain CT head wo contrast to evaluate. -Will discuss case with Cardiology #  CAD (coronary artery disease) s/p CABG in 2002 -Continue ASA 81mg  - Statin currently held -Last Echo showed EF 65-70%, Mod MR. #  Hyperlipidemia -Obtain lipid panel - Will hold pravastatin as patient reports allergy to our pharmaceutical alternative simvastatin. #  Hypothyroidism -Check TSH - Continue home dose of Synthroid - Right pole nodule last U/S March 2013. #  HTN (hypertension) -Will hold Maxide and Amlodipine as patient had orthostatic hypotension in ED on ambulation. # Prolonged QT -Qtc 518, avoid QT prolonging medication, if she remains nauseous will avoid Zofran. # Hypokalemia -K+ 3.3 on admission, will check mag and replace as needed. #Skin lesions -Patient will need follow up as outpatient. Dispo:  Disposition is deferred at this time, awaiting improvement of current medical problems. Anticipated discharge in approximately 1 day(s).   The patient does have a current PCP Laurena Slimmer, MD) and does not know need an [facility] hospital follow-up appointment after discharge.  The patient does not know have transportation limitations that hinder transportation to clinic appointments.  Signed: Carlynn Purl, DO 03/26/2013, 10:06 AM  Pager: [phone]

## 2013-03-26 NOTE — ED Notes (Signed)
Patient transported to Ultrasound 

## 2013-03-26 NOTE — H&P (Addendum)
  Date: 03/26/2013  Patient name: Natalie Bullock  Medical record number: [id number]  Date of birth: 1924/12/08   I have seen and evaluated [name] and discussed their care with the Residency Team.   Assessment and Plan: I have seen and evaluated the patient as outlined above. I agree with the formulated Assessment and Plan as detailed in the residents' admission note, with the following changes:   79 yo F with hx of CAD (CABG 2002), and syncopal episodes, previously evaluated by CV and neurology. Comes in after getting up to go the bathroom at home and passing out. She is not clear how long she was out. She did not bite her tongue or have any incontinence at home.  In ED she got up from bed to use bathroom and again had syncopal episode. In ED her HR and BP were notable for HR 43 and BP 66/34. .   ROS: no CP, no SOB, no missed medication doses, no f/c, no headaches, no weakness, no paresthesias. + joint pain "from my arthritis" Filed Vitals:   03/26/13 1030  BP: 122/44  Pulse: 72  Temp:   Resp:   MSE :3/3 EOMI, PERRL Mouth: without lesion Neck: FROM, NT, no LAN Cest: CTA CV: RRR Abd: BS+, soft, nontender Extr: 2+ edema Bilaterlally Non-focal neuro exam Pulses: 3+ BUE, 3+ R doralis pedis  Labs of note: K 3.3 WBC 10.7 UA 3-6 WBC, SE+ CXR: unchanged Troponin (-) x3 ECG #1 sinus brady  ECG #2: prolonged QT, LAE U/s: no AAA  A/p Syncopal episodes Hypothyroidism   Will watch her on tele Appreciate f/u from her CV MD (Dr Jens Som) Cycle Troponin Check UDS? Consider head CT (prev done on 12-05-12 showed: Atrophy with small vessel ischemic changes and intracranial atherosclerosis. But no acute changes. Is this sick sinus, does she need pacing? Shy-Drager?Her significant drop in the ED is concerning.    [name], MD 10/25/201410:34 AM

## 2013-03-26 NOTE — ED Notes (Addendum)
Pt son stated pt has frequent syncope from time to time. Last ED admission was [facility].

## 2013-03-26 NOTE — ED Notes (Signed)
Phlebotomy at bedside to collect labs.

## 2013-03-26 NOTE — ED Notes (Signed)
Went to sit pt up to ambulate, pt became very pale and stated she felt dizzy. Pts BP checked and it was 66/34, pulse was 43 and pt stated she was going to throw up. Pt layed back in the bed. MD made aware. Pt began to vomit.

## 2013-03-26 NOTE — ED Notes (Signed)
Pt returned from US

## 2013-03-26 NOTE — ED Notes (Signed)
Per Ems pt fall att home needed help to get up when EMS arrived,  a/ox3, when EMS assisted pt to standing position, pt had a syncope episode and she went unresponsive for 3-4 seconds. Pressure was palpable at 72, was awake when laying down with feet up. EKG shows SB 1st degree AV block, 50ml NS was given, 22ga to LFA.

## 2013-03-27 DIAGNOSIS — I951 Orthostatic hypotension: Secondary | ICD-10-CM | POA: Diagnosis not present

## 2013-03-27 LAB — RAPID URINE DRUG SCREEN, HOSP PERFORMED
Barbiturates: NOT DETECTED
Opiates: NOT DETECTED
Tetrahydrocannabinol: NOT DETECTED

## 2013-03-27 LAB — COMPREHENSIVE METABOLIC PANEL
ALT: 8 U/L (ref 0–35)
AST: 14 U/L (ref 0–37)
Albumin: 2.9 g/dL — ABNORMAL LOW (ref 3.5–5.2)
Alkaline Phosphatase: 43 U/L (ref 39–117)
Calcium: 9 mg/dL (ref 8.4–10.5)
Chloride: 103 mEq/L (ref 96–112)
Glucose, Bld: 87 mg/dL (ref 70–99)
Potassium: 4 mEq/L (ref 3.5–5.1)
Sodium: 136 mEq/L (ref 135–145)
Total Bilirubin: 0.3 mg/dL (ref 0.3–1.2)
Total Protein: 6.1 g/dL (ref 6.0–8.3)

## 2013-03-27 LAB — URINE CULTURE: Colony Count: 80000

## 2013-03-27 MED ORDER — TRAMADOL HCL 50 MG PO TABS
50.0000 mg | ORAL_TABLET | Freq: Once | ORAL | Status: AC
  Administered 2013-03-27: 50 mg via ORAL
  Filled 2013-03-27: qty 1

## 2013-03-27 MED ORDER — SODIUM CHLORIDE 0.9 % IV SOLN
INTRAVENOUS | Status: DC
  Administered 2013-03-27: 08:00:00 via INTRAVENOUS

## 2013-03-27 NOTE — Discharge Summary (Signed)
Name: Natalie Bullock MRN: [id number] DOB: Jul 30, 1924 79 y.o. PCP: Laurena Slimmer, MD  Date of Admission: 03/26/2013  4:17 AM Date of Discharge: 03/27/2013 Attending Physician: [name], MD  Discharge Diagnosis: Principal Problem:   Near syncope/Presyncope Active Problems:   CAD (coronary artery disease)   Hyperlipidemia   Hypothyroidism   HTN (hypertension)  Discharge Medications:   Medication List    STOP taking these medications       amLODipine 10 MG tablet  Commonly known as:  NORVASC     triamterene-hydrochlorothiazide 37.5-25 MG per tablet  Commonly known as:  MAXZIDE-25      TAKE these medications       aspirin 81 MG tablet  Take 81 mg by mouth daily.     feeding supplement (ENSURE COMPLETE) Liqd  Take 237 mLs by mouth 2 (two) times daily between meals.     levothyroxine 50 MCG tablet  Commonly known as:  SYNTHROID, LEVOTHROID  Take 50 mcg by mouth daily before breakfast.     pravastatin 20 MG tablet  Commonly known as:  PRAVACHOL  Take 20 mg by mouth daily.     SYSTANE OP  Place 2 drops into both eyes as needed. For eye dryness.     traMADol 50 MG tablet  Commonly known as:  ULTRAM  Take 50 mg by mouth every 8 (eight) hours as needed. For pain        Disposition and follow-up:   [name] was discharged from [facility] in Stable condition.  At the hospital follow up visit please address:  1.  ? Referral to dermatology for skin lesions on right thigh and scalp. Patient instructed to discontinue Maxzide and Amlodipine, consider restarting Amlodipine if significantly elevated BP.  Recommend compression stockings for LE edema. 2.  Labs / imaging needed at time of follow-up: BMP  3.  Pending labs/ test needing follow-up: None Follow-up Appointments:   Discharge Instructions:   Consultations: Treatment Team:  Hillis Range, MD Cardiology  Procedures Performed:  Ct Head Wo Contrast  03/26/2013   CLINICAL  DATA:  Fall with head trauma. Emesis.  EXAM: CT HEAD WITHOUT CONTRAST  TECHNIQUE: Contiguous axial images were obtained from the base of the skull through the vertex without intravenous contrast.  COMPARISON:  12/05/2012.  FINDINGS: No mass lesion, mass effect, midline shift, hydrocephalus, hemorrhage. No acute territorial cortical ischemia/infarct. Atrophy and chronic ischemic white matter disease is present. Intracranial atherosclerosis. Paranasal sinuses and mastoid air cells appear within normal limits. There is no skull fracture. Scout images appear within normal limits.  IMPRESSION: Mild atrophy and chronic ischemic white matter disease without acute intracranial abnormality.   Electronically Signed   By: Andreas Newport M.D.   On: 03/26/2013 23:02   US Aorta  03/26/2013   CLINICAL DATA:  Pain post trauma  EXAM: ULTRASOUND OF ABDOMINAL AORTA  TECHNIQUE: Ultrasound examination of the abdominal aorta was performed to evaluate for abdominal aortic aneurysm.  COMPARISON:  CT abdomen and pelvis August 04, 2011  FINDINGS: Longitudinal and transverse images of the abdominal aorta were obtained. The maximum transverse diameter of the aorta is 2.4 x 2.4 cm. The right common and left common iliac arteries both have maximum transverse diameters of 1 x 1 cm. There is no evidence of abdominal aortic aneurysm. There is no periaortic adenopathy or fluid. There is moderate atherosclerotic change in the aorta.  IMPRESSION: Atherosclerotic change but no abdominal aortic aneurysm. There is no  periaortic fluid.   Electronically Signed   By: Bretta Bang M.D.   On: 03/26/2013 08:44   Dg Chest Port 1 View  03/26/2013   CLINICAL DATA:  Pain post trauma  EXAM: PORTABLE CHEST - 1 VIEW  COMPARISON:  December 05, 2012  FINDINGS: There is scarring in the left mid lung region, stable. There is no edema or consolidation. Heart is mildly enlarged with normal pulmonary vascularity. Patient is status post coronary artery bypass  grafting.  There is atherosclerotic change in aorta. No adenopathy. No pneumothorax. No bone lesions.  IMPRESSION: Scarring left mid lung. No edema or consolidation. Stable cardiac silhouette. No appreciable change from prior study.   Electronically Signed   By: Bretta Bang M.D.   On: 03/26/2013 07:37   Admission HPI: Natalie Bullock is a 79 year old female of HTN, CAD s/p CABG in 2002, Hypothyroidism, HLD, and multiple syncopal episodes that have been worked up by Cardiology and Neurologly thought likely atrial syncope. She presents to the ED today after a fall this morning at her home. She reports she had gotten up this morning to go to the bathroom. In the hallway as she was returning to her bed she reports he fell and hit the back of her head, She denies any loss of consciousness. She denied any tongue or lip biting, denied any bowel or bladder incontinence, she denied any focal weakness or HA. She denied any prodromal symptoms including palpitations, dizziness, nausea, ringing in her ears. She denies any recent illness and reports she was in her normal state of health. She reports compliance with all of her medications. In the ED she reports she has had 3 episdoe of loose stools and 1 episode of NBNB nausea and vomiting. She denies any chest pain/ pressure, or SOB.  Cardiologist: Dr. Jens Som  Neurologist: Dr. Everlena Cooper  Attending Addendum: I have seen and evaluated the patient as outlined above. I agree with the formulated Assessment and Plan as detailed in the residents' admission note, with the following changes:  79 yo F with hx of CAD (CABG 2002), and syncopal episodes, previously evaluated by CV and neurology. Comes in after getting up to go the bathroom at home and passing out. She is not clear how long she was out. She did not bite her tongue or have any incontinence at home.  In ED she got up from bed to use bathroom and again had syncopal episode. In ED her HR and BP were notable for HR 43 and  BP 66/34. .  ROS: no CP, no SOB, no missed medication doses, no f/c, no headaches, no weakness, no paresthesias. + joint pain "from my arthritis" [name], MD   Hospital Course by problem list:   Near syncope/Presyncope, fall at home Patient has history of syncope, found to be orthostatic in ED, when ambulated BP dropped to 60/40, has been worked up previous by cardiology and Neurology. Patient was admitted and given IVF, her home blood pressure medications were held. A head CT was obtained due to patient hitting her head with the fall but was negative for an acute bleed. Patient had recent unremarkable event monitor, does not want loop implanted.  Cardiology was consulted and felt that this was due to dehydration.  Patient Maxizide was held throughout stay and was recommend to discontinue on discharge.  Amlodipine was also discontinued on discharge and patient was instructed this may need to be slowly restarted as needed for significantly elevated BP.Patient was  noted to have Sinus bradycardia but not felt to be symptomatic, her symptoms.  A TSH, A1C, and lipid panel were obtained and were WNL.  ACS was ruled out with serial cardiac enzymes.  Patient was noted to have 4 beats of NSVT on 03/26/13 , she was not felt to be a good candidate for ICD or medications but this can be reassessed as an outpatient.  CAD (coronary artery disease) s/p CABG in 2002  -Last Echo showed EF 65-70%, Mod MR.  Patient was continued on her home medications. Hypothyroidism  -TSH 0.392, Continued home dose of Synthroid  Prolonged QT  -Qtc 518 on admission EKG, avoided QT prolonging medication.  Did have 4 beats of NSVT on telemetry. Hypokalemia  -K+ 3.3 on admission, mag normal, replaced  Skin lesions  -Patient noted to have two skin lesions on admission (see below), she was instructed she will need to follow these up as an outpatient.   Discharge Vitals:   BP 101/55  Pulse 51  Temp(Src) 98.5 F (36.9 C)  (Oral)  Resp 14  Wt 135 lb 8 oz (61.462 kg)  BMI 24.01 kg/m2  SpO2 98%  Discharge Labs:  Results for orders placed during the hospital encounter of 03/26/13 (from the past 24 hour(s))  CBC     Status: Abnormal   Collection Time    03/26/13  2:25 PM      Result Value Range   WBC 10.8 (*) 4.0 - 10.5 K/uL   RBC 4.51  3.87 - 5.11 MIL/uL   Hemoglobin 12.5  12.0 - 15.0 g/dL   HCT 16.1  09.6 - 04.5 %   MCV 82.0  78.0 - 100.0 fL   MCH 27.7  26.0 - 34.0 pg   MCHC 33.8  30.0 - 36.0 g/dL   RDW 40.9  81.1 - 91.4 %   Platelets 207  150 - 400 K/uL  CREATININE, SERUM     Status: Abnormal   Collection Time    03/26/13  2:25 PM      Result Value Range   Creatinine, Ser 0.66  0.50 - 1.10 mg/dL   GFR calc non Af Amer 78 (*) >90 mL/min   GFR calc Af Amer >90  >90 mL/min  MAGNESIUM     Status: None   Collection Time    03/26/13  2:25 PM      Result Value Range   Magnesium 1.9  1.5 - 2.5 mg/dL  HEMOGLOBIN N8G     Status: None   Collection Time    03/26/13  2:25 PM      Result Value Range   Hemoglobin A1C 5.5  <5.7 %   Mean Plasma Glucose 111  <117 mg/dL  TSH     Status: None   Collection Time    03/26/13  2:25 PM      Result Value Range   TSH 0.392  0.350 - 4.500 uIU/mL  TROPONIN I     Status: None   Collection Time    03/26/13  2:25 PM      Result Value Range   Troponin I <0.30  <0.30 ng/mL  LIPID PANEL     Status: None   Collection Time    03/26/13  2:25 PM      Result Value Range   Cholesterol 125  0 - 200 mg/dL   Triglycerides 57  <956 mg/dL   HDL 56  >21 mg/dL   Total CHOL/HDL Ratio 2.2     VLDL 11  0 - 40 mg/dL   LDL Cholesterol 58  0 - 99 mg/dL  HIV ANTIBODY (ROUTINE TESTING)     Status: None   Collection Time    03/26/13  2:25 PM      Result Value Range   HIV NON REACTIVE  NON REACTIVE  TROPONIN I     Status: None   Collection Time    03/26/13  8:40 PM      Result Value Range   Troponin I <0.30  <0.30 ng/mL  COMPREHENSIVE METABOLIC PANEL     Status: Abnormal    Collection Time    03/27/13  4:55 AM      Result Value Range   Sodium 136  135 - 145 mEq/L   Potassium 4.0  3.5 - 5.1 mEq/L   Chloride 103  96 - 112 mEq/L   CO2 23  19 - 32 mEq/L   Glucose, Bld 87  70 - 99 mg/dL   BUN 13  6 - 23 mg/dL   Creatinine, Ser 2.95  0.50 - 1.10 mg/dL   Calcium 9.0  8.4 - 28.4 mg/dL   Total Protein 6.1  6.0 - 8.3 g/dL   Albumin 2.9 (*) 3.5 - 5.2 g/dL   AST 14  0 - 37 U/L   ALT 8  0 - 35 U/L   Alkaline Phosphatase 43  39 - 117 U/L   Total Bilirubin 0.3  0.3 - 1.2 mg/dL   GFR calc non Af Amer 75 (*) >90 mL/min   GFR calc Af Amer 87 (*) >90 mL/min  URINE RAPID DRUG SCREEN (HOSP PERFORMED)     Status: None   Collection Time    03/27/13  8:54 AM      Result Value Range   Opiates NONE DETECTED  NONE DETECTED   Cocaine NONE DETECTED  NONE DETECTED   Benzodiazepines NONE DETECTED  NONE DETECTED   Amphetamines NONE DETECTED  NONE DETECTED   Tetrahydrocannabinol NONE DETECTED  NONE DETECTED   Barbiturates NONE DETECTED  NONE DETECTED    Signed: Carlynn Purl, DO 03/27/2013, 10:48 AM   Time Spent on Discharge: 40 minutes Services Ordered on Discharge: none Equipment Ordered on Discharge: none

## 2013-03-27 NOTE — Progress Notes (Signed)
UR Completed.  Natalie Bullock Jane [phone] 03/27/2013  

## 2013-03-27 NOTE — Progress Notes (Signed)
       Patient Name: Natalie Bullock Date of Encounter: 03/27/2013    SUBJECTIVE: Up this morning. Blood pressure is improved compared to yesterday.  TELEMETRY:  Regular rhythm with heart rate in the 40s and 50s. Filed Vitals:   03/27/13 0000 03/27/13 0400 03/27/13 0540 03/27/13 0726  BP: 132/50 116/48 114/64 101/55  Pulse: 52 57 48 51  Temp: 98.2 F (36.8 C) 98.9 F (37.2 C) 98.3 F (36.8 C) 98.5 F (36.9 C)  TempSrc:   Oral Oral  Resp: 16 16 16 14   Weight:   135 lb 8 oz (61.462 kg)   SpO2: 98% 98% 99% 98%    Intake/Output Summary (Last 24 hours) at 03/27/13 0946 Last data filed at 03/26/13 1500  Gross per 24 hour  Intake    150 ml  Output      0 ml  Net    150 ml    LABS: Basic Metabolic Panel:  Recent Labs  16/10/96 0555 03/26/13 1425 03/27/13 0455  NA 135  --  136  K 3.3*  --  4.0  CL 98  --  103  CO2 26  --  23  GLUCOSE 101*  --  87  BUN 15  --  13  CREATININE 0.71 0.66 0.74  CALCIUM 10.1  --  9.0  MG  --  1.9  --    CBC:  Recent Labs  03/26/13 1425  WBC 10.8*  HGB 12.5  HCT 37.0  MCV 82.0  PLT 207   Cardiac Enzymes:  Recent Labs  03/26/13 0555 03/26/13 1425 03/26/13 2040  TROPONINI <0.30 <0.30 <0.30   BNP: No components found with this basename: POCBNP,  Hemoglobin A1C:  Recent Labs  03/26/13 1425  HGBA1C 5.5   Fasting Lipid Panel:  Recent Labs  03/26/13 1425  CHOL 125  HDL 56  LDLCALC 58  TRIG 57  CHOLHDL 2.2    Radiology/Studies:  No new data  Physical Exam: Blood pressure 101/55, pulse 51, temperature 98.5 F (36.9 C), temperature source Oral, resp. rate 14, weight 135 lb 8 oz (61.462 kg), SpO2 98.00%. Weight change:    Clear lungs Cardiac exam No edema ASSESSMENT:  1. Tendency towards orthostasis. This contributes to her recurrent falls.  Plan:  1. Execute the recommendations per Dr. Jenel Lucks note.  [name] 03/27/2013, 9:46 AM

## 2013-03-27 NOTE — Progress Notes (Signed)
  Date: 03/27/2013  Patient name: Natalie Bullock  Medical record number: [id number]  Date of birth: [birth year]   This patient has been seen and the plan of care was discussed with the house staff. Please see their note for complete details. I concur with their findings with the following additions/corrections:  Pt seen and has no complaints. Plan for d/c home. Her troponins are (-).  Appreciate CV f/u.  Will have her f/u with her pcp.   [name], MD 03/27/2013, 9:12 AM

## 2013-03-27 NOTE — Progress Notes (Signed)
NURSING PROGRESS NOTE  IMAN REINERTSEN [id number] Discharge Data: 03/27/2013 1:03 PM Attending Provider: [name], MD WUJ:[id number] S, MD     [name] to be D/C'd Home per MD order.  Discussed with the patient the After Visit Summary and all questions fully answered. All IV's discontinued with no bleeding noted. Telemetry discontinued. All belongings returned to patient for patient to take home.   Last Vital Signs:  Blood pressure 101/55, pulse 51, temperature 98.5 F (36.9 C), temperature source Oral, resp. rate 14, weight 61.462 kg (135 lb 8 oz), SpO2 98.00%.  Discharge Medication List   Medication List    STOP taking these medications       amLODipine 10 MG tablet  Commonly known as:  NORVASC     triamterene-hydrochlorothiazide 37.5-25 MG per tablet  Commonly known as:  MAXZIDE-25      TAKE these medications       aspirin 81 MG tablet  Take 81 mg by mouth daily.     feeding supplement (ENSURE COMPLETE) Liqd  Take 237 mLs by mouth 2 (two) times daily between meals.     levothyroxine 50 MCG tablet  Commonly known as:  SYNTHROID, LEVOTHROID  Take 50 mcg by mouth daily before breakfast.     pravastatin 20 MG tablet  Commonly known as:  PRAVACHOL  Take 20 mg by mouth daily.     SYSTANE OP  Place 2 drops into both eyes as needed. For eye dryness.     traMADol 50 MG tablet  Commonly known as:  ULTRAM  Take 50 mg by mouth every 8 (eight) hours as needed. For pain

## 2013-03-27 NOTE — Progress Notes (Signed)
Subjective: Patient feels very well.  On telemetry had one episode of 4 beats of VT around 8:30 last night.  Walked by nursing and felt well. Objective: Vital signs in last 24 hours: Filed Vitals:   03/27/13 0000 03/27/13 0400 03/27/13 0540 03/27/13 0726  BP: 132/50 116/48 114/64 101/55  Pulse: 52 57 48 51  Temp: 98.2 F (36.8 C) 98.9 F (37.2 C) 98.3 F (36.8 C) 98.5 F (36.9 C)  TempSrc:   Oral Oral  Resp: 16 16 16 14   Weight:   135 lb 8 oz (61.462 kg)   SpO2: 98% 98% 99% 98%   Weight change:   Intake/Output Summary (Last 24 hours) at 03/27/13 1048 Last data filed at 03/26/13 1500  Gross per 24 hour  Intake    150 ml  Output      0 ml  Net    150 ml   General: resting in bed, very pleasant Cardiac: RRR Pulm: clear to auscultation bilaterally Abd: soft, nontender, nondistended, BS present Ext: warm and well perfused, 1+ pedal edema Neuro: alert and oriented X3  Lab Results: Basic Metabolic Panel:  Recent Labs Lab 03/26/13 0555 03/26/13 1425 03/27/13 0455  NA 135  --  136  K 3.3*  --  4.0  CL 98  --  103  CO2 26  --  23  GLUCOSE 101*  --  87  BUN 15  --  13  CREATININE 0.71 0.66 0.74  CALCIUM 10.1  --  9.0  MG  --  1.9  --    Liver Function Tests:  Recent Labs Lab 03/27/13 0455  AST 14  ALT 8  ALKPHOS 43  BILITOT 0.3  PROT 6.1  ALBUMIN 2.9*   CBC:  Recent Labs Lab 03/26/13 1425  WBC 10.8*  HGB 12.5  HCT 37.0  MCV 82.0  PLT 207   Cardiac Enzymes:  Recent Labs Lab 03/26/13 0555 03/26/13 1425 03/26/13 2040  TROPONINI <0.30 <0.30 <0.30   Hemoglobin A1C:  Recent Labs Lab 03/26/13 1425  HGBA1C 5.5   Fasting Lipid Panel:  Recent Labs Lab 03/26/13 1425  CHOL 125  HDL 56  LDLCALC 58  TRIG 57  CHOLHDL 2.2   Thyroid Function Tests:  Recent Labs Lab 03/26/13 1425  TSH 0.392  Urine Drug Screen: Drugs of Abuse     Component Value Date/Time   LABOPIA NONE DETECTED 03/27/2013 0854   LABOPIA NEGATIVE 09/13/2011 0346   COCAINSCRNUR NONE DETECTED 03/27/2013 0854   COCAINSCRNUR NEGATIVE 09/13/2011 0346   LABBENZ NONE DETECTED 03/27/2013 0854   LABBENZ NEGATIVE 09/13/2011 0346   AMPHETMU NONE DETECTED 03/27/2013 0854   AMPHETMU NEGATIVE 09/13/2011 0346   THCU NONE DETECTED 03/27/2013 0854   LABBARB NONE DETECTED 03/27/2013 0854    Urinalysis:  Recent Labs Lab 03/26/13 0624  COLORURINE YELLOW  LABSPEC 1.013  PHURINE 8.0  GLUCOSEU NEGATIVE  HGBUR NEGATIVE  BILIRUBINUR NEGATIVE  KETONESUR NEGATIVE  PROTEINUR NEGATIVE  UROBILINOGEN 1.0  NITRITE NEGATIVE  LEUKOCYTESUR TRACE*    Micro Results: Recent Results (from the past 240 hour(s))  URINE CULTURE     Status: None   Collection Time    03/26/13  6:24 AM      Result Value Range Status   Specimen Description URINE, CLEAN CATCH   Final   Special Requests CX ADDED AT 1610 ON [id number]   Final   Culture  Setup Time     Final   Value: 03/26/2013 13:15     Performed  at [facility] Count     Final   Value: 80,000 COLONIES/ML     Performed at [facility]   Culture     Final   Value: Multiple bacterial morphotypes present, none predominant. Suggest appropriate recollection if clinically indicated.     Performed at [facility]   Report Status 03/27/2013 FINAL   Final   Studies/Results: Ct Head Wo Contrast  03/26/2013   CLINICAL DATA:  Fall with head trauma. Emesis.  EXAM: CT HEAD WITHOUT CONTRAST  TECHNIQUE: Contiguous axial images were obtained from the base of the skull through the vertex without intravenous contrast.  COMPARISON:  12/05/2012.  FINDINGS: No mass lesion, mass effect, midline shift, hydrocephalus, hemorrhage. No acute territorial cortical ischemia/infarct. Atrophy and chronic ischemic white matter disease is present. Intracranial atherosclerosis. Paranasal sinuses and mastoid air cells appear within normal limits. There is no skull fracture. Scout images appear within normal limits.  IMPRESSION: Mild  atrophy and chronic ischemic white matter disease without acute intracranial abnormality.   Electronically Signed   By: Andreas Newport M.D.   On: 03/26/2013 23:02   US Aorta  03/26/2013   CLINICAL DATA:  Pain post trauma  EXAM: ULTRASOUND OF ABDOMINAL AORTA  TECHNIQUE: Ultrasound examination of the abdominal aorta was performed to evaluate for abdominal aortic aneurysm.  COMPARISON:  CT abdomen and pelvis August 04, 2011  FINDINGS: Longitudinal and transverse images of the abdominal aorta were obtained. The maximum transverse diameter of the aorta is 2.4 x 2.4 cm. The right common and left common iliac arteries both have maximum transverse diameters of 1 x 1 cm. There is no evidence of abdominal aortic aneurysm. There is no periaortic adenopathy or fluid. There is moderate atherosclerotic change in the aorta.  IMPRESSION: Atherosclerotic change but no abdominal aortic aneurysm. There is no periaortic fluid.   Electronically Signed   By: Bretta Bang M.D.   On: 03/26/2013 08:44   Dg Chest Port 1 View  03/26/2013   CLINICAL DATA:  Pain post trauma  EXAM: PORTABLE CHEST - 1 VIEW  COMPARISON:  December 05, 2012  FINDINGS: There is scarring in the left mid lung region, stable. There is no edema or consolidation. Heart is mildly enlarged with normal pulmonary vascularity. Patient is status post coronary artery bypass grafting.  There is atherosclerotic change in aorta. No adenopathy. No pneumothorax. No bone lesions.  IMPRESSION: Scarring left mid lung. No edema or consolidation. Stable cardiac silhouette. No appreciable change from prior study.   Electronically Signed   By: Bretta Bang M.D.   On: 03/26/2013 07:37   Medications: I have reviewed the patient's current medications. Scheduled Meds: . aspirin  81 mg Oral Daily  . enoxaparin (LOVENOX) injection  40 mg Subcutaneous Q24H  . feeding supplement (ENSURE COMPLETE)  237 mL Oral BID BM  . levothyroxine  50 mcg Oral QAC breakfast  . sodium chloride   3 mL Intravenous Q12H  . traMADol  50 mg Oral Once   Continuous Infusions: . sodium chloride 100 mL/hr at 03/27/13 0821   PRN Meds:.polyvinyl alcohol Assessment/Plan: # Near syncope with fall at home History of syncope, found to be orthostatic in ED, has been worked up previous by cardiology and Neurology.  Patient had recent unremarkable event monitor, does not want loop implanted.   -CT head neg for acute bleed. Patient improved with IVF, ambulating with ease. Will discharge home today, HTN medications discontinued, spoke and replayed information to [employer]  Holley Raring who helps take care of Ms Teater.  Will follow up with PCP and Cardiology. # CAD (coronary artery disease) s/p CABG in 2002  -Continue ASA 81mg   - Statin currently held, will resume on D/C -Last Echo showed EF 65-70%, Mod MR.  # Hyperlipidemia  -LDL at goal. - Will hold pravastatin as patient reports allergy to our pharmaceutical alternative simvastatin.  # Hypothyroidism  -TSH 0.392 - Continue home dose of Synthroid  - Right pole nodule last U/S March 2013.  # HTN (hypertension)  -D/C Maxide and Amlodipine as patient had orthostatic hypotension in ED on ambulation.  # Prolonged QT  -Qtc 518, avoid QT prolonging medication # Hypokalemia  -K+ 3.3 on admission, mag normal, replaced #Skin lesions  -Patient will need follow up as outpatient.  Dispo: Discharge home today   .Services Needed at time of discharge: Y = Yes, Blank = No PT:   OT:   RN:   Equipment:   Other:     LOS: 1 day   Carlynn Purl, DO 03/27/2013, 10:48 AM

## 2013-03-27 NOTE — H&P (Signed)
  Date: 03/27/2013  Patient name: Natalie Bullock  Medical record number: [id number]  Date of birth: 09/16/1924   I have seen and evaluated Natalie Bullock and discussed their care with the Residency Team.   Assessment and Plan: I have seen and evaluated the patient as outlined above. I agree with the formulated Assessment and Plan as detailed in the residents' admission note, with the following changes:  Please see my note as well.   [name], MD 10/26/20149:11 AM

## 2013-03-27 NOTE — Progress Notes (Signed)
Patient ambulated in hallway with assistance of NT. Ambulated 200 feet. Tolerated well.

## 2013-03-30 DIAGNOSIS — I10 Essential (primary) hypertension: Secondary | ICD-10-CM | POA: Diagnosis not present

## 2013-03-30 DIAGNOSIS — I2510 Atherosclerotic heart disease of native coronary artery without angina pectoris: Secondary | ICD-10-CM | POA: Diagnosis not present

## 2013-03-30 DIAGNOSIS — E039 Hypothyroidism, unspecified: Secondary | ICD-10-CM | POA: Diagnosis not present

## 2013-03-30 DIAGNOSIS — E7800 Pure hypercholesterolemia, unspecified: Secondary | ICD-10-CM | POA: Diagnosis not present

## 2013-04-04 ENCOUNTER — Telehealth: Payer: Self-pay | Admitting: Cardiology

## 2013-04-05 ENCOUNTER — Encounter: Payer: Self-pay | Admitting: Cardiology

## 2013-04-08 ENCOUNTER — Ambulatory Visit (INDEPENDENT_AMBULATORY_CARE_PROVIDER_SITE_OTHER): Payer: Medicare Other | Admitting: Cardiology

## 2013-04-08 ENCOUNTER — Encounter: Payer: Self-pay | Admitting: Cardiology

## 2013-04-08 VITALS — BP 163/71 | HR 62 | Ht 63.0 in | Wt 141.8 lb

## 2013-04-08 DIAGNOSIS — E785 Hyperlipidemia, unspecified: Secondary | ICD-10-CM | POA: Diagnosis not present

## 2013-04-08 DIAGNOSIS — R55 Syncope and collapse: Secondary | ICD-10-CM | POA: Diagnosis not present

## 2013-04-08 DIAGNOSIS — I10 Essential (primary) hypertension: Secondary | ICD-10-CM | POA: Diagnosis not present

## 2013-04-08 DIAGNOSIS — I2510 Atherosclerotic heart disease of native coronary artery without angina pectoris: Secondary | ICD-10-CM | POA: Diagnosis not present

## 2013-04-08 NOTE — Assessment & Plan Note (Signed)
Continue statin. 

## 2013-04-08 NOTE — Assessment & Plan Note (Signed)
Blood pressure is mildly elevated. However we are allowing this to run higher as her syncopal episodes have felt to be potentially related to orthostasis. Continue present medications and follow.

## 2013-04-08 NOTE — Progress Notes (Signed)
HPI: FU CAD and syncope. She has h/o CABG in 2002, syncope, HTN. Patient has had difficulties with recurrent syncope. Last Myoview 10/12: No scar or ischemia, EF 74%. Echocardiogram 08/2011: EF 65-70%, moderate MR, moderate PI. Carotid Dopplers March 2015 showed no significant stenosis. Event monitor 9/13 demonstrated no significant arrhythmias. Recently admitted in October with a syncopal episode. Patient was seen by Dr. Johney Frame and her diuretics were discontinued. Patient did have some bradycardia on telemetry that was felt unrelated to her syncopal episodes. She was offered an implantable loop but she declined. Since discharge, she denies dyspnea, chest pain or recurrent syncope. She does not have orthostatic symptoms.   Current Outpatient Prescriptions  Medication Sig Dispense Refill  . amLODipine (NORVASC) 10 MG tablet Take 10 mg by mouth daily.      Marland Kitchen aspirin 81 MG tablet Take 81 mg by mouth daily.       . feeding supplement (ENSURE COMPLETE) LIQD Take 237 mLs by mouth 2 (two) times daily between meals.      Marland Kitchen levothyroxine (SYNTHROID, LEVOTHROID) 50 MCG tablet Take 50 mcg by mouth daily before breakfast.       . naproxen (NAPROSYN) 375 MG tablet Take 375 mg by mouth 2 (two) times daily with a meal.      . Polyethyl Glycol-Propyl Glycol (SYSTANE OP) Place 2 drops into both eyes as needed. For eye dryness.      . pravastatin (PRAVACHOL) 20 MG tablet Take 20 mg by mouth daily.       No current facility-administered medications for this visit.     Past Medical History  Diagnosis Date  . IHD (ischemic heart disease)     with CABG in August 2002  . Hypertension   . Hypothyroidism   . Vitamin D deficiency   . Hyperlipidemia   . Anemia   . Hx of CABG     pt states she had 5 bypasses  . Breast cancer 1975    Right sided mastectomy  . Coronary artery disease   . Myocardial infarction     Non Q-wave MI  . Arthritis     osteoarthritis  . H/O hypokalemia     Past Surgical  History  Procedure Laterality Date  . Coronary artery bypass graft  12/2000    LEFT INTERNAL MAMMARY TO THE LAD, SAPHENOUS VEIN GRAFT TO THE FIRST DIAGONAL, SAPHENOUS VEIN GRAFT TO THE RAMUS INTERMEDIATE, AND A SEQUENTIAL SAPHENOUS VEIN GRAFT TO THE POSTERIOR DESCENDING AND POSTERIOR LATERAL BRANCHES.  . Mastectomy  1975    RIGHT SIDE  . Cardiovascular stress test  Aug 2010    Normal; EF 74%  . Cardiac catheterization  01/20/2001    NORMAL. EF 60-70%  . Tubal ligation      1960's  . Breast surgery      left breast cyst surgery   . Mass excision  04/08/2011    Procedure: EXCISION MASS;  Surgeon: Nicki Reaper, MD;  Location: [facility];  Service: Orthopedics;  Laterality: Left;  excision mass left palm    History   Social History  . Marital Status: Widowed    Spouse Name: N/A    Number of Children: N/A  . Years of Education: N/A   Occupational History  . Not on file.   Social History Main Topics  . Smoking status: Former Smoker -- 1.00 packs/day for 4 years    Types: Cigarettes    Quit date: 06/02/1969  . Smokeless  tobacco: Never Used  . Alcohol Use: No  . Drug Use: No  . Sexual Activity: No   Other Topics Concern  . Not on file   Social History Narrative  . No narrative on file    ROS: no fevers or chills, productive cough, hemoptysis, dysphasia, odynophagia, melena, hematochezia, dysuria, hematuria, rash, seizure activity, orthopnea, PND, pedal edema, claudication. Remaining systems are negative.  Physical Exam: Well-developed well-nourished in no acute distress.  Skin is warm and dry.  HEENT is normal.  Neck is supple.  Chest is clear to auscultation with normal expansion.  Cardiovascular exam is regular rate and rhythm.  Abdominal exam nontender or distended. No masses palpated. Extremities show 1+ ankle edema. neuro grossly intact

## 2013-04-08 NOTE — Patient Instructions (Signed)
Your physician recommends that you schedule a follow-up appointment in: 3 MONTHS WITH DR CRENSHAW  

## 2013-04-08 NOTE — Assessment & Plan Note (Signed)
Continue aspirin and statin. 

## 2013-04-08 NOTE — Assessment & Plan Note (Signed)
No recurrent episodes since hospital. Episodes felt potentially to be orthostatic mediated. Continue off of diuretic and allow blood pressure to run higher. We can consider an implantable loop in the future if she has recurrent symptoms despite above measures. She declined this during recent hospitalization.

## 2013-05-11 DIAGNOSIS — I2510 Atherosclerotic heart disease of native coronary artery without angina pectoris: Secondary | ICD-10-CM | POA: Diagnosis not present

## 2013-05-11 DIAGNOSIS — E7800 Pure hypercholesterolemia, unspecified: Secondary | ICD-10-CM | POA: Diagnosis not present

## 2013-05-11 DIAGNOSIS — E039 Hypothyroidism, unspecified: Secondary | ICD-10-CM | POA: Diagnosis not present

## 2013-05-11 DIAGNOSIS — I10 Essential (primary) hypertension: Secondary | ICD-10-CM | POA: Diagnosis not present

## 2013-05-11 DIAGNOSIS — M25519 Pain in unspecified shoulder: Secondary | ICD-10-CM | POA: Diagnosis not present

## 2013-07-05 DIAGNOSIS — H16229 Keratoconjunctivitis sicca, not specified as Sjogren's, unspecified eye: Secondary | ICD-10-CM | POA: Diagnosis not present

## 2013-07-05 DIAGNOSIS — H2510 Age-related nuclear cataract, unspecified eye: Secondary | ICD-10-CM | POA: Diagnosis not present

## 2013-07-19 ENCOUNTER — Ambulatory Visit: Payer: Medicare Other | Admitting: Cardiology

## 2013-08-01 ENCOUNTER — Encounter: Payer: Self-pay | Admitting: Cardiology

## 2013-08-09 ENCOUNTER — Other Ambulatory Visit: Payer: Self-pay | Admitting: Cardiology

## 2013-08-10 ENCOUNTER — Other Ambulatory Visit: Payer: Self-pay | Admitting: *Deleted

## 2013-08-10 MED ORDER — PRAVASTATIN SODIUM 20 MG PO TABS: 20.0000 mg | ORAL_TABLET | Freq: Every day | ORAL | Status: DC

## 2013-08-15 DIAGNOSIS — Z1231 Encounter for screening mammogram for malignant neoplasm of breast: Secondary | ICD-10-CM | POA: Diagnosis not present

## 2013-08-23 DIAGNOSIS — I2510 Atherosclerotic heart disease of native coronary artery without angina pectoris: Secondary | ICD-10-CM | POA: Diagnosis not present

## 2013-08-23 DIAGNOSIS — I10 Essential (primary) hypertension: Secondary | ICD-10-CM | POA: Diagnosis not present

## 2013-08-23 DIAGNOSIS — M2550 Pain in unspecified joint: Secondary | ICD-10-CM | POA: Diagnosis not present

## 2013-08-23 DIAGNOSIS — E7800 Pure hypercholesterolemia, unspecified: Secondary | ICD-10-CM | POA: Diagnosis not present

## 2013-08-23 DIAGNOSIS — E039 Hypothyroidism, unspecified: Secondary | ICD-10-CM | POA: Diagnosis not present

## 2013-08-26 ENCOUNTER — Ambulatory Visit: Payer: Medicare Other | Admitting: Cardiology

## 2013-09-12 ENCOUNTER — Ambulatory Visit (INDEPENDENT_AMBULATORY_CARE_PROVIDER_SITE_OTHER): Payer: Medicare Other | Admitting: Cardiology

## 2013-09-12 ENCOUNTER — Encounter: Payer: Self-pay | Admitting: Cardiology

## 2013-09-12 VITALS — BP 178/83 | HR 65 | Ht 63.0 in | Wt 136.0 lb

## 2013-09-12 DIAGNOSIS — R55 Syncope and collapse: Secondary | ICD-10-CM

## 2013-09-12 DIAGNOSIS — I10 Essential (primary) hypertension: Secondary | ICD-10-CM

## 2013-09-12 DIAGNOSIS — E785 Hyperlipidemia, unspecified: Secondary | ICD-10-CM

## 2013-09-12 DIAGNOSIS — I2510 Atherosclerotic heart disease of native coronary artery without angina pectoris: Secondary | ICD-10-CM | POA: Diagnosis not present

## 2013-09-12 NOTE — Assessment & Plan Note (Signed)
Continue aspirin and statin. 

## 2013-09-12 NOTE — Patient Instructions (Signed)
Your physician wants you to follow-up in: [place] will receive a reminder letter in the mail two months in advance. If you don't receive a letter, please call our office to schedule the follow-up appointment.

## 2013-09-12 NOTE — Assessment & Plan Note (Signed)
No recurrent episodes. Consider implantable loop in the future if recurrent episodes.

## 2013-09-12 NOTE — Assessment & Plan Note (Signed)
Continue statin. 

## 2013-09-12 NOTE — Progress Notes (Signed)
HPI: FU CAD and syncope. She has h/o CABG in 2002 and HTN. Patient has had difficulties with recurrent syncope. Last Myoview 10/12: No scar or ischemia, EF 74%. Echocardiogram 08/2011: EF 65-70%, moderate MR, moderate PI. Carotid Dopplers March 2015 showed no significant stenosis. Event monitor 9/13 demonstrated no significant arrhythmias. Recently admitted in October with a syncopal episode. Patient was seen by Dr. Rayann Heman and her diuretics were discontinued. Patient did have some bradycardia on telemetry that was felt unrelated to her syncopal episodes. She was offered an implantable loop but she declined. Last seen in Nov 2014. Since then, the patient denies any dyspnea on exertion, orthopnea, PND, pedal edema, palpitations, syncope or chest pain.    Current Outpatient Prescriptions  Medication Sig Dispense Refill  . amLODipine (NORVASC) 10 MG tablet Take 10 mg by mouth daily.      Marland Kitchen aspirin 81 MG tablet Take 81 mg by mouth daily.       . feeding supplement (ENSURE COMPLETE) LIQD Take 237 mLs by mouth 2 (two) times daily between meals.      Marland Kitchen levothyroxine (SYNTHROID, LEVOTHROID) 50 MCG tablet Take 50 mcg by mouth daily before breakfast.       . naproxen (NAPROSYN) 375 MG tablet Take 375 mg by mouth 2 (two) times daily with a meal.      . Polyethyl Glycol-Propyl Glycol (SYSTANE OP) Place 2 drops into both eyes as needed. For eye dryness.      . pravastatin (PRAVACHOL) 20 MG tablet Take 1 tablet (20 mg total) by mouth daily.  30 tablet  0   No current facility-administered medications for this visit.     Past Medical History  Diagnosis Date  . IHD (ischemic heart disease)     with CABG in August 2002  . Hypertension   . Hypothyroidism   . Vitamin D deficiency   . Hyperlipidemia   . Anemia   . Hx of CABG     pt states she had 5 bypasses  . Breast cancer 1975    Right sided mastectomy  . Coronary artery disease   . Myocardial infarction     Non Q-wave MI  . Arthritis    osteoarthritis  . H/O hypokalemia     Past Surgical History  Procedure Laterality Date  . Coronary artery bypass graft  12/2000    LEFT INTERNAL MAMMARY TO THE LAD, SAPHENOUS VEIN GRAFT TO THE FIRST DIAGONAL, SAPHENOUS VEIN GRAFT TO THE RAMUS INTERMEDIATE, AND A SEQUENTIAL SAPHENOUS VEIN GRAFT TO THE POSTERIOR DESCENDING AND POSTERIOR LATERAL BRANCHES.  . Mastectomy  1975    RIGHT SIDE  . Cardiovascular stress test  Aug 2010    Normal; EF 74%  . Cardiac catheterization  01/20/2001    NORMAL. EF 60-70%  . Tubal ligation      1960's  . Breast surgery      left breast cyst surgery   . Mass excision  04/08/2011    Procedure: EXCISION MASS;  Surgeon: Wynonia Sours, MD;  Location: [place];  Service: Orthopedics;  Laterality: Left;  excision mass left palm    History   Social History  . Marital Status: Widowed    Spouse Name: N/A    Number of Children: N/A  . Years of Education: N/A   Occupational History  . Not on file.   Social History Main Topics  . Smoking status: Former Smoker -- 1.00 packs/day for 4 years    Types:  Cigarettes    Quit date: 06/02/1969  . Smokeless tobacco: Never Used  . Alcohol Use: No  . Drug Use: No  . Sexual Activity: No   Other Topics Concern  . Not on file   Social History Narrative  . No narrative on file    ROS: no fevers or chills, productive cough, hemoptysis, dysphasia, odynophagia, melena, hematochezia, dysuria, hematuria, rash, seizure activity, orthopnea, PND, pedal edema, claudication. Remaining systems are negative.  Physical Exam: Well-developed well-nourished in no acute distress.  Skin is warm and dry.  HEENT is normal.  Neck is supple.  Chest is clear to auscultation with normal expansion. Previous sternotomy Cardiovascular exam is regular rate and rhythm.  Abdominal exam nontender or distended. No masses palpated. Extremities show no edema. neuro grossly intact  ECG Sinus rhythm at a rate of 65. Nonspecific  ST changes.

## 2013-09-12 NOTE — Assessment & Plan Note (Signed)
Blood pressure elevated. I have asked her to purchase a cuff and follow her blood pressure at home. I am allowing it to run higher to prevent recurrent syncope.

## 2013-09-20 ENCOUNTER — Other Ambulatory Visit: Payer: Self-pay

## 2013-09-20 MED ORDER — PRAVASTATIN SODIUM 20 MG PO TABS: 20.0000 mg | ORAL_TABLET | Freq: Every day | ORAL | Status: DC

## 2013-10-04 NOTE — Telephone Encounter (Signed)
ERROR

## 2014-01-03 DIAGNOSIS — I2510 Atherosclerotic heart disease of native coronary artery without angina pectoris: Secondary | ICD-10-CM | POA: Diagnosis not present

## 2014-01-03 DIAGNOSIS — M2550 Pain in unspecified joint: Secondary | ICD-10-CM | POA: Diagnosis not present

## 2014-01-03 DIAGNOSIS — I10 Essential (primary) hypertension: Secondary | ICD-10-CM | POA: Diagnosis not present

## 2014-01-03 DIAGNOSIS — E039 Hypothyroidism, unspecified: Secondary | ICD-10-CM | POA: Diagnosis not present

## 2014-01-03 DIAGNOSIS — E7800 Pure hypercholesterolemia, unspecified: Secondary | ICD-10-CM | POA: Diagnosis not present

## 2014-01-20 ENCOUNTER — Encounter (HOSPITAL_COMMUNITY): Payer: Self-pay | Admitting: Emergency Medicine

## 2014-01-20 ENCOUNTER — Emergency Department (HOSPITAL_COMMUNITY): Payer: Medicare Other

## 2014-01-20 ENCOUNTER — Emergency Department (HOSPITAL_COMMUNITY)
Admission: EM | Admit: 2014-01-20 | Discharge: 2014-01-21 | Disposition: A | Discharge status: Home / Self Care | Payer: Medicare Other | Attending: Emergency Medicine | Admitting: Emergency Medicine

## 2014-01-20 DIAGNOSIS — Z9889 Other specified postprocedural states: Secondary | ICD-10-CM | POA: Insufficient documentation

## 2014-01-20 DIAGNOSIS — Z87891 Personal history of nicotine dependence: Secondary | ICD-10-CM | POA: Diagnosis not present

## 2014-01-20 DIAGNOSIS — Z862 Personal history of diseases of the blood and blood-forming organs and certain disorders involving the immune mechanism: Secondary | ICD-10-CM | POA: Diagnosis not present

## 2014-01-20 DIAGNOSIS — Z853 Personal history of malignant neoplasm of breast: Secondary | ICD-10-CM | POA: Insufficient documentation

## 2014-01-20 DIAGNOSIS — I2510 Atherosclerotic heart disease of native coronary artery without angina pectoris: Secondary | ICD-10-CM | POA: Insufficient documentation

## 2014-01-20 DIAGNOSIS — R0602 Shortness of breath: Secondary | ICD-10-CM | POA: Insufficient documentation

## 2014-01-20 DIAGNOSIS — I259 Chronic ischemic heart disease, unspecified: Secondary | ICD-10-CM | POA: Insufficient documentation

## 2014-01-20 DIAGNOSIS — Z7982 Long term (current) use of aspirin: Secondary | ICD-10-CM | POA: Diagnosis not present

## 2014-01-20 DIAGNOSIS — Z951 Presence of aortocoronary bypass graft: Secondary | ICD-10-CM | POA: Insufficient documentation

## 2014-01-20 DIAGNOSIS — Z79899 Other long term (current) drug therapy: Secondary | ICD-10-CM | POA: Diagnosis not present

## 2014-01-20 DIAGNOSIS — R079 Chest pain, unspecified: Secondary | ICD-10-CM

## 2014-01-20 DIAGNOSIS — R071 Chest pain on breathing: Secondary | ICD-10-CM | POA: Diagnosis not present

## 2014-01-20 DIAGNOSIS — I10 Essential (primary) hypertension: Secondary | ICD-10-CM | POA: Insufficient documentation

## 2014-01-20 DIAGNOSIS — Z8639 Personal history of other endocrine, nutritional and metabolic disease: Secondary | ICD-10-CM | POA: Diagnosis not present

## 2014-01-20 DIAGNOSIS — E785 Hyperlipidemia, unspecified: Secondary | ICD-10-CM | POA: Insufficient documentation

## 2014-01-20 DIAGNOSIS — E039 Hypothyroidism, unspecified: Secondary | ICD-10-CM | POA: Insufficient documentation

## 2014-01-20 DIAGNOSIS — M129 Arthropathy, unspecified: Secondary | ICD-10-CM | POA: Insufficient documentation

## 2014-01-20 DIAGNOSIS — I252 Old myocardial infarction: Secondary | ICD-10-CM | POA: Diagnosis not present

## 2014-01-20 LAB — BASIC METABOLIC PANEL
ANION GAP: 11 (ref 5–15)
BUN: 18 mg/dL (ref 6–23)
CO2: 26 meq/L (ref 19–32)
CREATININE: 0.72 mg/dL (ref 0.50–1.10)
Calcium: 9.9 mg/dL (ref 8.4–10.5)
Chloride: 104 mEq/L (ref 96–112)
GFR calc non Af Amer: 76 mL/min — ABNORMAL LOW (ref >90)
GFR, EST AFRICAN AMERICAN: 88 mL/min — AB (ref >90)
Glucose, Bld: 90 mg/dL (ref 70–99)
POTASSIUM: 4.3 meq/L (ref 3.7–5.3)
Sodium: 141 mEq/L (ref 137–147)

## 2014-01-20 LAB — I-STAT TROPONIN, ED: Troponin i, poc: 0 ng/mL (ref 0.00–0.08)

## 2014-01-20 LAB — CBC WITH DIFFERENTIAL/PLATELET
Basophils Absolute: 0 10*3/uL (ref 0.0–0.1)
Basophils Relative: 0 % (ref 0–1)
Eosinophils Absolute: 0.2 10*3/uL (ref 0.0–0.7)
Eosinophils Relative: 2 % (ref 0–5)
HEMATOCRIT: 33.1 % — AB (ref 36.0–46.0)
Hemoglobin: 10.9 g/dL — ABNORMAL LOW (ref 12.0–15.0)
LYMPHS ABS: 0.9 10*3/uL (ref 0.7–4.0)
LYMPHS PCT: 13 % (ref 12–46)
MCH: 27.5 pg (ref 26.0–34.0)
MCHC: 32.9 g/dL (ref 30.0–36.0)
MCV: 83.4 fL (ref 78.0–100.0)
MONO ABS: 0.4 10*3/uL (ref 0.1–1.0)
MONOS PCT: 6 % (ref 3–12)
NEUTROS ABS: 5.5 10*3/uL (ref 1.7–7.7)
NEUTROS PCT: 79 % — AB (ref 43–77)
Platelets: 160 10*3/uL (ref 150–400)
RBC: 3.97 MIL/uL (ref 3.87–5.11)
RDW: 13.8 % (ref 11.5–15.5)
WBC: 7 10*3/uL (ref 4.0–10.5)

## 2014-01-20 NOTE — ED Notes (Signed)
From xray

## 2014-01-20 NOTE — ED Notes (Signed)
C/o chestb pain since 1600 today denies n/v  No sob denies diaphoresis pain "moves all over"

## 2014-01-20 NOTE — ED Provider Notes (Signed)
CSN: [id number]     Arrival date & time 01/20/14  1957 History   First MD Initiated Contact with Patient 01/20/14 1958     Chief Complaint  Patient presents with  . Chest Pain     (Consider location/radiation/quality/duration/timing/severity/associated sxs/prior Treatment) HPI 80 year old female with history of coronary artery disease with prior CABG presents with 4 hours gradual onset constant waxing and waning sharp stabbing positional diffuse chest pain without shortness of breath without cough without fever without abdominal pain without radiation or associated symptoms, pain worse with position changes and palpation and better she lies still. She has no pleuritic component. There is no treatment prior to arrival and she does not want pain medicine now. Her pain is mild it can be moderate at times. Past Medical History  Diagnosis Date  . IHD (ischemic heart disease)     with CABG in August 2002  . Hypertension   . Hypothyroidism   . Vitamin D deficiency   . Hyperlipidemia   . Anemia   . Hx of CABG     pt states she had 5 bypasses  . Breast cancer 1975    Right sided mastectomy  . Coronary artery disease   . Myocardial infarction     Non Q-wave MI  . Arthritis     osteoarthritis  . H/O hypokalemia    Past Surgical History  Procedure Laterality Date  . Coronary artery bypass graft  12/2000    LEFT INTERNAL MAMMARY TO THE LAD, SAPHENOUS VEIN GRAFT TO THE FIRST DIAGONAL, SAPHENOUS VEIN GRAFT TO THE RAMUS INTERMEDIATE, AND A SEQUENTIAL SAPHENOUS VEIN GRAFT TO THE POSTERIOR DESCENDING AND POSTERIOR LATERAL BRANCHES.  . Mastectomy  1975    RIGHT SIDE  . Cardiovascular stress test  Aug 2010    Normal; EF 74%  . Cardiac catheterization  01/20/2001    NORMAL. EF 60-70%  . Tubal ligation      1960's  . Breast surgery      left breast cyst surgery   . Mass excision  04/08/2011    Procedure: EXCISION MASS;  Surgeon: Wynonia Sours, MD;  Location: [place];  Service:  Orthopedics;  Laterality: Left;  excision mass left palm   Family History  Problem Relation Age of Onset  . Alzheimer's disease Mother    History  Substance Use Topics  . Smoking status: Former Smoker -- 1.00 packs/day for 4 years    Types: Cigarettes    Quit date: 06/02/1969  . Smokeless tobacco: Never Used  . Alcohol Use: No   OB History   Grav Para Term Preterm Abortions TAB SAB Ect Mult Living                 Review of Systems 10 Systems reviewed and are negative for acute change except as noted in the HPI.   Allergies  Valium; Codeine; Crestor; Doxycycline; Ezetimibe-simvastatin; Lipitor; Sulfa drugs cross reactors; Tequin; and Zocor  Home Medications   Prior to Admission medications   Medication Sig Start Date End Date Taking? Authorizing Provider  amLODipine (NORVASC) 10 MG tablet Take 10 mg by mouth daily.   Yes Historical Provider, MD  aspirin 81 MG tablet Take 81 mg by mouth daily.    Yes Historical Provider, MD  feeding supplement (ENSURE COMPLETE) LIQD Take 237 mLs by mouth 2 (two) times daily between meals. 09/16/11  Yes Barton Dubois, MD  levothyroxine (SYNTHROID, LEVOTHROID) 50 MCG tablet Take 50 mcg by mouth daily before breakfast.  Yes Historical Provider, MD  naproxen (NAPROSYN) 375 MG tablet Take 375 mg by mouth 2 (two) times daily as needed for moderate pain.    Yes Historical Provider, MD  Polyethyl Glycol-Propyl Glycol (SYSTANE OP) Place 2 drops into both eyes every morning. For eye dryness.   Yes Historical Provider, MD  pravastatin (PRAVACHOL) 20 MG tablet Take 1 tablet (20 mg total) by mouth at bedtime. 01/30/14   Lelon Perla, MD   BP 152/68  Pulse 64  Temp(Src) 98.5 F (36.9 C) (Oral)  Resp 18  Ht 5\' 5"  (1.651 m)  Wt 136 lb (61.689 kg)  BMI 22.63 kg/m2  SpO2 97% Physical Exam  Nursing note and vitals reviewed. Constitutional:  Awake, alert, nontoxic appearance.  HENT:  Head: Atraumatic.  Eyes: Right eye exhibits no discharge. Left  eye exhibits no discharge.  Neck: Neck supple.  Cardiovascular: Normal rate and regular rhythm.   No murmur heard. Pulmonary/Chest: Effort normal and breath sounds normal. No respiratory distress. She has no wheezes. She has no rales. She exhibits tenderness.  Reproducible chest wall tenderness without rash with pulse oximetry normal room air 98%  Abdominal: Soft. There is no tenderness. There is no rebound.  Musculoskeletal: She exhibits no edema and no tenderness.  Baseline ROM, no obvious new focal weakness.  Neurological: She is alert.  Mental status and motor strength appears baseline for patient and situation.  Skin: No rash noted.  Psychiatric: She has a normal mood and affect.    ED Course  Procedures (including critical care time) Patient informed of clinical course, understand medical decision-making process, and agree with plan. Labs Review Labs Reviewed  BASIC METABOLIC PANEL - Abnormal; Notable for the following:    GFR calc non Af Amer 76 (*)    GFR calc Af Amer 88 (*)    All other components within normal limits  CBC WITH DIFFERENTIAL - Abnormal; Notable for the following:    Hemoglobin 10.9 (*)    HCT 33.1 (*)    Neutrophils Relative % 79 (*)    All other components within normal limits  I-STAT TROPOININ, ED  I-STAT TROPOININ, ED    Imaging Review No results found.   EKG Interpretation   Date/Time:  Friday January 20 2014 20:05:16 EDT Ventricular Rate:  82 PR Interval:  213 QRS Duration: 85 QT Interval:  380 QTC Calculation: 444 R Axis:   31 Text Interpretation:  Sinus rhythm Borderline prolonged PR interval  Borderline T abnormalities, anterior leads No significant change since  last tracing Confirmed by [facility]  MD, Jenny Reichmann ([id number]) on 01/20/2014 8:16:09 PM      MDM   Final diagnoses:  Chest pain, unspecified chest pain type    I doubt any other EMC precluding discharge at this time including, but not necessarily limited to the  following:AMI.    Babette Relic, MD 01/31/14 1946

## 2014-01-20 NOTE — ED Notes (Signed)
Ambulatory to and from br gait steady

## 2014-01-21 DIAGNOSIS — R0602 Shortness of breath: Secondary | ICD-10-CM | POA: Diagnosis not present

## 2014-01-21 LAB — I-STAT TROPONIN, ED: TROPONIN I, POC: 0.02 ng/mL (ref 0.00–0.08)

## 2014-01-21 NOTE — ED Notes (Signed)
Pt alert, NAD, calm, interactive, "ready to go", reports continued pain, comes and goes, 4/10, (denies: nausea, sob, palpitations, fluttering, numbness, tingling or other sx), Dr. Sharol Given in to speak with pt & family, VSS.

## 2014-01-21 NOTE — ED Provider Notes (Signed)
Care assumed from Dr Stevie Kern.  Chest pain, does not appear ischemic, awaiting second troponin  Results for orders placed during the hospital encounter of 30/09/23  BASIC METABOLIC PANEL      Result Value Ref Range   Sodium 141  137 - 147 mEq/L   Potassium 4.3  3.7 - 5.3 mEq/L   Chloride 104  96 - 112 mEq/L   CO2 26  19 - 32 mEq/L   Glucose, Bld 90  70 - 99 mg/dL   BUN 18  6 - 23 mg/dL   Creatinine, Ser 0.72  0.50 - 1.10 mg/dL   Calcium 9.9  8.4 - 10.5 mg/dL   GFR calc non Af Amer 76 (*) >90 mL/min   GFR calc Af Amer 88 (*) >90 mL/min   Anion gap 11  5 - 15  CBC WITH DIFFERENTIAL      Result Value Ref Range   WBC 7.0  4.0 - 10.5 K/uL   RBC 3.97  3.87 - 5.11 MIL/uL   Hemoglobin 10.9 (*) 12.0 - 15.0 g/dL   HCT 33.1 (*) 36.0 - 46.0 %   MCV 83.4  78.0 - 100.0 fL   MCH 27.5  26.0 - 34.0 pg   MCHC 32.9  30.0 - 36.0 g/dL   RDW 13.8  11.5 - 15.5 %   Platelets 160  150 - 400 K/uL   Neutrophils Relative % 79 (*) 43 - 77 %   Neutro Abs 5.5  1.7 - 7.7 K/uL   Lymphocytes Relative 13  12 - 46 %   Lymphs Abs 0.9  0.7 - 4.0 K/uL   Monocytes Relative 6  3 - 12 %   Monocytes Absolute 0.4  0.1 - 1.0 K/uL   Eosinophils Relative 2  0 - 5 %   Eosinophils Absolute 0.2  0.0 - 0.7 K/uL   Basophils Relative 0  0 - 1 %   Basophils Absolute 0.0  0.0 - 0.1 K/uL  I-STAT TROPOININ, ED      Result Value Ref Range   Troponin i, poc 0.00  0.00 - 0.08 ng/mL   Comment 3           I-STAT TROPOININ, ED      Result Value Ref Range   Troponin i, poc 0.02  0.00 - 0.08 ng/mL   Comment 3            Dg Chest 2 View  01/20/2014   CLINICAL DATA:  Chest pain  EXAM: CHEST  2 VIEW  COMPARISON:  04/26/2013  FINDINGS: There is mild cardiomegaly which is chronic. Chronic mild aortic tortuosity. Status post CABG.  There is no edema, consolidation, effusion, or pneumothorax.  IMPRESSION: No active cardiopulmonary disease.   Electronically Signed   By: Jorje Guild M.D.   On: 01/20/2014 21:52      Kalman Drape,  MD 01/21/14 [phone]

## 2014-01-21 NOTE — Discharge Instructions (Signed)

## 2014-01-26 DIAGNOSIS — E7800 Pure hypercholesterolemia, unspecified: Secondary | ICD-10-CM | POA: Diagnosis not present

## 2014-01-26 DIAGNOSIS — E039 Hypothyroidism, unspecified: Secondary | ICD-10-CM | POA: Diagnosis not present

## 2014-01-26 DIAGNOSIS — I2510 Atherosclerotic heart disease of native coronary artery without angina pectoris: Secondary | ICD-10-CM | POA: Diagnosis not present

## 2014-01-26 DIAGNOSIS — I10 Essential (primary) hypertension: Secondary | ICD-10-CM | POA: Diagnosis not present

## 2014-01-30 ENCOUNTER — Other Ambulatory Visit: Payer: Self-pay | Admitting: *Deleted

## 2014-01-30 MED ORDER — PRAVASTATIN SODIUM 20 MG PO TABS: 20.0000 mg | ORAL_TABLET | Freq: Every day | ORAL | Status: DC

## 2014-03-07 ENCOUNTER — Encounter: Payer: Self-pay | Admitting: Cardiology

## 2014-03-07 ENCOUNTER — Ambulatory Visit (INDEPENDENT_AMBULATORY_CARE_PROVIDER_SITE_OTHER): Payer: Medicare Other | Admitting: Cardiology

## 2014-03-07 ENCOUNTER — Encounter: Payer: Self-pay | Admitting: *Deleted

## 2014-03-07 VITALS — BP 160/70 | HR 86 | Ht 63.0 in | Wt 135.4 lb

## 2014-03-07 DIAGNOSIS — R55 Syncope and collapse: Secondary | ICD-10-CM

## 2014-03-07 DIAGNOSIS — I2510 Atherosclerotic heart disease of native coronary artery without angina pectoris: Secondary | ICD-10-CM | POA: Diagnosis not present

## 2014-03-07 DIAGNOSIS — E785 Hyperlipidemia, unspecified: Secondary | ICD-10-CM

## 2014-03-07 DIAGNOSIS — I340 Nonrheumatic mitral (valve) insufficiency: Secondary | ICD-10-CM

## 2014-03-07 DIAGNOSIS — I10 Essential (primary) hypertension: Secondary | ICD-10-CM

## 2014-03-07 DIAGNOSIS — I059 Rheumatic mitral valve disease, unspecified: Secondary | ICD-10-CM

## 2014-03-07 DIAGNOSIS — I2583 Coronary atherosclerosis due to lipid rich plaque: Secondary | ICD-10-CM

## 2014-03-07 NOTE — Assessment & Plan Note (Signed)
Blood pressure is mildly elevated. I have asked her to follow this at home. I am hesitant to increase blood pressure medications based on one reading that she has had problems with recurrent syncope in the past that improved after discontinuing diuretics.

## 2014-03-07 NOTE — Progress Notes (Signed)
HPI: FU CAD and syncope. She has h/o CABG in 2002 and HTN. Patient has had difficulties with recurrent syncope. Last Myoview 10/12: No scar or ischemia, EF 74%. Echocardiogram 08/2011: EF 65-70%, moderate MR, moderate PI. Carotid Dopplers March 2015 showed no significant stenosis. Event monitor 9/13 demonstrated no significant arrhythmias. Patient was seen by Dr. Rayann Heman 10/14 for syncope and her diuretics were discontinued. Patient did have some bradycardia on telemetry that was felt unrelated to her syncopal episodes. She was offered an implantable loop but she declined. Seen in ER 8/15 with CP. Enzymes neg. Since last seen, She denies dyspnea, chest pain, palpitations or syncope.   Current Outpatient Prescriptions  Medication Sig Dispense Refill  . amLODipine (NORVASC) 10 MG tablet Take 10 mg by mouth daily.      Marland Kitchen aspirin 81 MG tablet Take 81 mg by mouth daily.       . feeding supplement (ENSURE COMPLETE) LIQD Take 237 mLs by mouth 2 (two) times daily between meals.      Marland Kitchen levothyroxine (SYNTHROID, LEVOTHROID) 50 MCG tablet Take 50 mcg by mouth daily before breakfast.       . naproxen (NAPROSYN) 375 MG tablet Take 375 mg by mouth 2 (two) times daily as needed for moderate pain.       Vladimir Faster Glycol-Propyl Glycol (SYSTANE OP) Place 2 drops into both eyes every morning. For eye dryness.      . pravastatin (PRAVACHOL) 20 MG tablet Take 1 tablet (20 mg total) by mouth at bedtime.  90 tablet  0   No current facility-administered medications for this visit.     Past Medical History  Diagnosis Date  . IHD (ischemic heart disease)     with CABG in August 2002  . Hypertension   . Hypothyroidism   . Vitamin D deficiency   . Hyperlipidemia   . Anemia   . Hx of CABG     pt states she had 5 bypasses  . Breast cancer 1975    Right sided mastectomy  . Coronary artery disease   . Myocardial infarction     Non Q-wave MI  . Arthritis     osteoarthritis  . H/O hypokalemia     Past  Surgical History  Procedure Laterality Date  . Coronary artery bypass graft  12/2000    LEFT INTERNAL MAMMARY TO THE LAD, SAPHENOUS VEIN GRAFT TO THE FIRST DIAGONAL, SAPHENOUS VEIN GRAFT TO THE RAMUS INTERMEDIATE, AND A SEQUENTIAL SAPHENOUS VEIN GRAFT TO THE POSTERIOR DESCENDING AND POSTERIOR LATERAL BRANCHES.  . Mastectomy  1975    RIGHT SIDE  . Cardiovascular stress test  Aug 2010    Normal; EF 74%  . Cardiac catheterization  01/20/2001    NORMAL. EF 60-70%  . Tubal ligation      1960's  . Breast surgery      left breast cyst surgery   . Mass excision  04/08/2011    Procedure: EXCISION MASS;  Surgeon: Wynonia Sours, MD;  Location: [place];  Service: Orthopedics;  Laterality: Left;  excision mass left palm    History   Social History  . Marital Status: Widowed    Spouse Name: N/A    Number of Children: N/A  . Years of Education: N/A   Occupational History  . Not on file.   Social History Main Topics  . Smoking status: Former Smoker -- 1.00 packs/day for 4 years    Types: Cigarettes  Quit date: 06/02/1969  . Smokeless tobacco: Never Used  . Alcohol Use: No  . Drug Use: No  . Sexual Activity: No   Other Topics Concern  . Not on file   Social History Narrative  . No narrative on file    ROS: Arthralgias but no fevers or chills, productive cough, hemoptysis, dysphasia, odynophagia, melena, hematochezia, dysuria, hematuria, rash, seizure activity, orthopnea, PND, pedal edema, claudication. Remaining systems are negative.  Physical Exam: Well-developed well-nourished in no acute distress.  Skin is warm and dry.  HEENT is normal.  Neck is supple.  Chest is clear to auscultation with normal expansion.  Cardiovascular exam is regular rate and rhythm.  Abdominal exam nontender or distended. No masses palpated. Extremities show no edema. neuro grossly intact  ECG 01/20/2014-sinus rhythm, First degree AV block, nonspecific T-wave changes.

## 2014-03-07 NOTE — Assessment & Plan Note (Addendum)
Continue aspirin and statin.Patient has not had recent exertional chest pain. Patient is taking a nonsteroidal anti-inflammatory. She has significant arthritis. She will try Tylenol or home remedies and discontinue this if possible.

## 2014-03-07 NOTE — Patient Instructions (Signed)

## 2014-03-07 NOTE — Assessment & Plan Note (Signed)
Plan repeat echocardiogram. 

## 2014-03-07 NOTE — Assessment & Plan Note (Signed)
Continue statin. 

## 2014-03-07 NOTE — Assessment & Plan Note (Signed)
No recurrent episodes. 

## 2014-03-14 ENCOUNTER — Ambulatory Visit (HOSPITAL_COMMUNITY)
Admission: RE | Admit: 2014-03-14 | Discharge: 2014-03-14 | Disposition: A | Discharge status: Home / Self Care | Payer: Medicare Other | Source: Ambulatory Visit | Attending: Cardiology | Admitting: Cardiology

## 2014-03-14 DIAGNOSIS — Z87891 Personal history of nicotine dependence: Secondary | ICD-10-CM | POA: Insufficient documentation

## 2014-03-14 DIAGNOSIS — I059 Rheumatic mitral valve disease, unspecified: Secondary | ICD-10-CM | POA: Diagnosis not present

## 2014-03-14 DIAGNOSIS — I10 Essential (primary) hypertension: Secondary | ICD-10-CM | POA: Insufficient documentation

## 2014-03-14 DIAGNOSIS — Z23 Encounter for immunization: Secondary | ICD-10-CM | POA: Diagnosis not present

## 2014-03-14 DIAGNOSIS — E785 Hyperlipidemia, unspecified: Secondary | ICD-10-CM | POA: Diagnosis not present

## 2014-03-14 DIAGNOSIS — I359 Nonrheumatic aortic valve disorder, unspecified: Secondary | ICD-10-CM | POA: Insufficient documentation

## 2014-03-14 DIAGNOSIS — Z853 Personal history of malignant neoplasm of breast: Secondary | ICD-10-CM

## 2014-03-14 DIAGNOSIS — I2510 Atherosclerotic heart disease of native coronary artery without angina pectoris: Secondary | ICD-10-CM

## 2014-03-14 NOTE — Progress Notes (Signed)
2D Echocardiogram Complete.  03/14/2014   Natalie Bullock [place], [place]

## 2014-03-21 ENCOUNTER — Ambulatory Visit: Payer: Medicare Other | Admitting: Cardiology

## 2014-05-01 ENCOUNTER — Other Ambulatory Visit: Payer: Self-pay | Admitting: Cardiology

## 2014-08-17 DIAGNOSIS — Z853 Personal history of malignant neoplasm of breast: Secondary | ICD-10-CM | POA: Diagnosis not present

## 2014-08-17 DIAGNOSIS — Z1231 Encounter for screening mammogram for malignant neoplasm of breast: Secondary | ICD-10-CM | POA: Diagnosis not present

## 2014-09-11 DIAGNOSIS — I10 Essential (primary) hypertension: Secondary | ICD-10-CM | POA: Diagnosis not present

## 2014-09-11 DIAGNOSIS — E780 Pure hypercholesterolemia: Secondary | ICD-10-CM | POA: Diagnosis not present

## 2014-09-11 DIAGNOSIS — E789 Disorder of lipoprotein metabolism, unspecified: Secondary | ICD-10-CM | POA: Diagnosis not present

## 2014-09-11 DIAGNOSIS — E039 Hypothyroidism, unspecified: Secondary | ICD-10-CM | POA: Diagnosis not present

## 2014-09-11 DIAGNOSIS — I2510 Atherosclerotic heart disease of native coronary artery without angina pectoris: Secondary | ICD-10-CM | POA: Diagnosis not present

## 2014-09-12 NOTE — Progress Notes (Signed)
HPI: FU CAD and syncope. She has h/o CABG in 2002 and HTN. Patient has had difficulties with recurrent syncope. Last Myoview 10/12: No scar or ischemia, EF 74%. Carotid Dopplers March 2015 showed no significant stenosis. Event monitor 9/13 demonstrated no significant arrhythmias. Patient was seen by Dr. Rayann Heman 10/14 for syncope and her diuretics were discontinued. Patient did have some bradycardia on telemetry that was felt unrelated to her syncopal episodes. She was offered an implantable loop but she declined. Echocardiogram repeated in October 2015 and showed normal LV function, mild left atrial enlargement and mild mitral regurgitation. Since last seen, the patient denies any dyspnea on exertion, orthopnea, PND, pedal edema, palpitations, syncope or chest pain.   Current Outpatient Prescriptions  Medication Sig Dispense Refill  . amLODipine (NORVASC) 10 MG tablet Take 10 mg by mouth daily.    Marland Kitchen aspirin 81 MG tablet Take 81 mg by mouth daily.     . feeding supplement (ENSURE COMPLETE) LIQD Take 237 mLs by mouth 2 (two) times daily between meals.    Marland Kitchen levothyroxine (SYNTHROID, LEVOTHROID) 50 MCG tablet Take 50 mcg by mouth daily before breakfast.     . naproxen (NAPROSYN) 375 MG tablet Take 375 mg by mouth 2 (two) times daily as needed for moderate pain.     Vladimir Faster Glycol-Propyl Glycol (SYSTANE OP) Place 2 drops into both eyes every morning. For eye dryness.    . pravastatin (PRAVACHOL) 20 MG tablet TAKE 1 TABLET (20 MG TOTAL) BY MOUTH AT BEDTIME. 90 tablet 1   No current facility-administered medications for this visit.     Past Medical History  Diagnosis Date  . IHD (ischemic heart disease)     with CABG in August 2002  . Hypertension   . Hypothyroidism   . Vitamin D deficiency   . Hyperlipidemia   . Anemia   . Hx of CABG     pt states she had 5 bypasses  . Breast cancer 1975    Right sided mastectomy  . Coronary artery disease   . Myocardial infarction     Non  Q-wave MI  . Arthritis     osteoarthritis  . H/O hypokalemia     Past Surgical History  Procedure Laterality Date  . Coronary artery bypass graft  12/2000    LEFT INTERNAL MAMMARY TO THE LAD, SAPHENOUS VEIN GRAFT TO THE FIRST DIAGONAL, SAPHENOUS VEIN GRAFT TO THE RAMUS INTERMEDIATE, AND A SEQUENTIAL SAPHENOUS VEIN GRAFT TO THE POSTERIOR DESCENDING AND POSTERIOR LATERAL BRANCHES.  . Mastectomy  1975    RIGHT SIDE  . Cardiovascular stress test  Aug 2010    Normal; EF 74%  . Cardiac catheterization  01/20/2001    NORMAL. EF 60-70%  . Tubal ligation      1960's  . Breast surgery      left breast cyst surgery   . Mass excision  04/08/2011    Procedure: EXCISION MASS;  Surgeon: Wynonia Sours, MD;  Location: [facility];  Service: Orthopedics;  Laterality: Left;  excision mass left palm    History   Social History  . Marital Status: Widowed    Spouse Name: N/A  . Number of Children: N/A  . Years of Education: N/A   Occupational History  . Not on file.   Social History Main Topics  . Smoking status: Former Smoker -- 1.00 packs/day for 4 years    Types: Cigarettes    Quit date: 06/02/1969  .  Smokeless tobacco: Never Used  . Alcohol Use: No  . Drug Use: No  . Sexual Activity: No   Other Topics Concern  . Not on file   Social History Narrative    ROS: arthralgias but no fevers or chills, productive cough, hemoptysis, dysphasia, odynophagia, melena, hematochezia, dysuria, hematuria, rash, seizure activity, orthopnea, PND, pedal edema, claudication. Remaining systems are negative.  Physical Exam: Well-developed well-nourished in no acute distress.  Skin is warm and dry.  HEENT is normal.  Neck is supple.  Chest is clear to auscultation with normal expansion.  Cardiovascular exam is regular rate and rhythm.  Abdominal exam nontender or distended. No masses palpated. Extremities show no edema. neuro grossly intact  ECG sinus rhythm at a rate of 61. No ST  changes.

## 2014-09-15 ENCOUNTER — Ambulatory Visit (INDEPENDENT_AMBULATORY_CARE_PROVIDER_SITE_OTHER): Payer: Medicare Other | Admitting: Cardiology

## 2014-09-15 ENCOUNTER — Encounter: Payer: Self-pay | Admitting: Cardiology

## 2014-09-15 VITALS — BP 146/60 | HR 61 | Wt 131.4 lb

## 2014-09-15 DIAGNOSIS — I340 Nonrheumatic mitral (valve) insufficiency: Secondary | ICD-10-CM

## 2014-09-15 DIAGNOSIS — R55 Syncope and collapse: Secondary | ICD-10-CM | POA: Diagnosis not present

## 2014-09-15 DIAGNOSIS — E785 Hyperlipidemia, unspecified: Secondary | ICD-10-CM

## 2014-09-15 DIAGNOSIS — I10 Essential (primary) hypertension: Secondary | ICD-10-CM

## 2014-09-15 DIAGNOSIS — I2510 Atherosclerotic heart disease of native coronary artery without angina pectoris: Secondary | ICD-10-CM | POA: Diagnosis not present

## 2014-09-15 DIAGNOSIS — I2583 Coronary atherosclerosis due to lipid rich plaque: Secondary | ICD-10-CM

## 2014-09-15 NOTE — Assessment & Plan Note (Signed)
Continue statin. 

## 2014-09-15 NOTE — Assessment & Plan Note (Signed)
No recurrent episodes. Will consider loop monitor in the future if she has recurrences.

## 2014-09-15 NOTE — Assessment & Plan Note (Signed)
Continue aspirin and statin. 

## 2014-09-15 NOTE — Assessment & Plan Note (Signed)
Mild on most recent echo. 

## 2014-09-15 NOTE — Patient Instructions (Signed)
Your physician wants you to follow-up in: ONE YEAR WITH DR CRENSHAW You will receive a reminder letter in the mail two months in advance. If you don't receive a letter, please call our office to schedule the follow-up appointment.  

## 2014-09-15 NOTE — Assessment & Plan Note (Signed)
Continue present blood pressure medications. 

## 2014-11-02 ENCOUNTER — Other Ambulatory Visit: Payer: Self-pay | Admitting: Cardiology

## 2014-11-02 NOTE — Telephone Encounter (Signed)
Rx(s) sent to pharmacy electronically.  

## 2015-01-08 DIAGNOSIS — I10 Essential (primary) hypertension: Secondary | ICD-10-CM | POA: Diagnosis not present

## 2015-01-08 DIAGNOSIS — E039 Hypothyroidism, unspecified: Secondary | ICD-10-CM | POA: Diagnosis not present

## 2015-01-08 DIAGNOSIS — E559 Vitamin D deficiency, unspecified: Secondary | ICD-10-CM | POA: Diagnosis not present

## 2015-01-08 DIAGNOSIS — M2550 Pain in unspecified joint: Secondary | ICD-10-CM | POA: Diagnosis not present

## 2015-01-08 DIAGNOSIS — E780 Pure hypercholesterolemia: Secondary | ICD-10-CM | POA: Diagnosis not present

## 2015-04-12 DIAGNOSIS — Z23 Encounter for immunization: Secondary | ICD-10-CM | POA: Diagnosis not present

## 2015-05-08 DIAGNOSIS — E781 Pure hyperglyceridemia: Secondary | ICD-10-CM | POA: Diagnosis not present

## 2015-05-08 DIAGNOSIS — I10 Essential (primary) hypertension: Secondary | ICD-10-CM | POA: Diagnosis not present

## 2015-05-08 DIAGNOSIS — E039 Hypothyroidism, unspecified: Secondary | ICD-10-CM | POA: Diagnosis not present

## 2015-08-02 ENCOUNTER — Other Ambulatory Visit: Payer: Self-pay | Admitting: Cardiology

## 2015-08-02 NOTE — Telephone Encounter (Signed)
REFILL 

## 2015-09-11 DIAGNOSIS — I10 Essential (primary) hypertension: Secondary | ICD-10-CM | POA: Diagnosis not present

## 2015-09-11 DIAGNOSIS — E039 Hypothyroidism, unspecified: Secondary | ICD-10-CM | POA: Diagnosis not present

## 2015-09-11 DIAGNOSIS — M0600 Rheumatoid arthritis without rheumatoid factor, unspecified site: Secondary | ICD-10-CM | POA: Diagnosis not present

## 2015-09-11 DIAGNOSIS — M1990 Unspecified osteoarthritis, unspecified site: Secondary | ICD-10-CM | POA: Diagnosis not present

## 2015-09-23 NOTE — Progress Notes (Signed)
HPI: FU CAD and syncope. She has h/o CABG in 2002 and HTN. Patient has had difficulties with recurrent syncope. Last Myoview 10/12: No scar or ischemia, EF 74%. Carotid Dopplers March 2015 showed no significant stenosis. Event monitor 9/13 demonstrated no significant arrhythmias. Patient was seen by Dr. Rayann Heman 10/14 for syncope and her diuretics were discontinued. Patient did have some bradycardia on telemetry that was felt unrelated to her syncopal episodes. She was offered an implantable loop but she declined. Echocardiogram repeated in October 2015 and showed normal LV function, mild left atrial enlargement and mild mitral regurgitation. Since last seen, the patient denies any dyspnea on exertion, orthopnea, PND, pedal edema, palpitations, syncope or chest pain.   Current Outpatient Prescriptions  Medication Sig Dispense Refill  . amLODipine (NORVASC) 10 MG tablet Take 10 mg by mouth daily.    Marland Kitchen aspirin 81 MG tablet Take 81 mg by mouth daily.     . feeding supplement (ENSURE COMPLETE) LIQD Take 237 mLs by mouth 2 (two) times daily between meals.    Marland Kitchen levothyroxine (SYNTHROID, LEVOTHROID) 50 MCG tablet Take 50 mcg by mouth daily before breakfast.     . Polyethyl Glycol-Propyl Glycol (SYSTANE OP) Place 2 drops into both eyes every morning. For eye dryness.    . pravastatin (PRAVACHOL) 20 MG tablet TAKE 1 TABLET (20 MG TOTAL) BY MOUTH AT BEDTIME. 90 tablet 1   No current facility-administered medications for this visit.     Past Medical History  Diagnosis Date  . IHD (ischemic heart disease)     with CABG in August 2002  . Hypertension   . Hypothyroidism   . Vitamin D deficiency   . Hyperlipidemia   . Anemia   . Hx of CABG     pt states she had 5 bypasses  . Breast cancer ([place]) 1975    Right sided mastectomy  . Coronary artery disease   . Myocardial infarction ([facility])     Non Q-wave MI  . Arthritis     osteoarthritis  . H/O hypokalemia     Past Surgical History    Procedure Laterality Date  . Coronary artery bypass graft  12/2000    LEFT INTERNAL MAMMARY TO THE LAD, SAPHENOUS VEIN GRAFT TO THE FIRST DIAGONAL, SAPHENOUS VEIN GRAFT TO THE RAMUS INTERMEDIATE, AND A SEQUENTIAL SAPHENOUS VEIN GRAFT TO THE POSTERIOR DESCENDING AND POSTERIOR LATERAL BRANCHES.  . Mastectomy  1975    RIGHT SIDE  . Cardiovascular stress test  Aug 2010    Normal; EF 74%  . Cardiac catheterization  01/20/2001    NORMAL. EF 60-70%  . Tubal ligation      1960's  . Breast surgery      left breast cyst surgery   . Mass excision  04/08/2011    Procedure: EXCISION MASS;  Surgeon: Wynonia Sours, MD;  Location: [facility];  Service: Orthopedics;  Laterality: Left;  excision mass left palm    Social History   Social History  . Marital Status: Widowed    Spouse Name: N/A  . Number of Children: N/A  . Years of Education: N/A   Occupational History  . Not on file.   Social History Main Topics  . Smoking status: Former Smoker -- 1.00 packs/day for 4 years    Types: Cigarettes    Quit date: 06/02/1969  . Smokeless tobacco: Never Used  . Alcohol Use: No  . Drug Use: No  . Sexual Activity: No  Other Topics Concern  . Not on file   Social History Narrative    Family History  Problem Relation Age of Onset  . Alzheimer's disease Mother     ROS: Arthralgias but no fevers or chills, productive cough, hemoptysis, dysphasia, odynophagia, melena, hematochezia, dysuria, hematuria, rash, seizure activity, orthopnea, PND, pedal edema, claudication. Remaining systems are negative.  Physical Exam: Well-developed well-nourished in no acute distress.  Skin is warm and dry.  HEENT is normal.  Neck is supple.  Chest is clear to auscultation with normal expansion.  Cardiovascular exam is regular rate and rhythm.  Abdominal exam nontender or distended. No masses palpated. Extremities show no edema. neuro grossly intact  ECG Normal sinus rhythm, normal axis,No ST  changes.

## 2015-09-27 ENCOUNTER — Ambulatory Visit (INDEPENDENT_AMBULATORY_CARE_PROVIDER_SITE_OTHER): Payer: Medicare Other | Admitting: Cardiology

## 2015-09-27 ENCOUNTER — Encounter: Payer: Self-pay | Admitting: Cardiology

## 2015-09-27 VITALS — BP 148/52 | HR 60 | Ht 64.0 in | Wt 141.0 lb

## 2015-09-27 DIAGNOSIS — I10 Essential (primary) hypertension: Secondary | ICD-10-CM | POA: Diagnosis not present

## 2015-09-27 DIAGNOSIS — I25810 Atherosclerosis of coronary artery bypass graft(s) without angina pectoris: Secondary | ICD-10-CM | POA: Diagnosis not present

## 2015-09-27 NOTE — Patient Instructions (Signed)
Your physician wants you to follow-up in: ONE YEAR WITH DR CRENSHAW You will receive a reminder letter in the mail two months in advance. If you don't receive a letter, please call our office to schedule the follow-up appointment.   If you need a refill on your cardiac medications before your next appointment, please call your pharmacy.  

## 2015-09-27 NOTE — Assessment & Plan Note (Signed)
Continue statin. Lipids and liver monitored by primary care. 

## 2015-09-27 NOTE — Assessment & Plan Note (Signed)
Continue aspirin and statin. 

## 2015-09-27 NOTE — Assessment & Plan Note (Signed)
Blood pressure reasonable for patient's age. Continue present medications.

## 2015-09-27 NOTE — Assessment & Plan Note (Signed)
No recent episodes

## 2015-10-19 DIAGNOSIS — R42 Dizziness and giddiness: Secondary | ICD-10-CM | POA: Diagnosis not present

## 2015-10-19 DIAGNOSIS — R404 Transient alteration of awareness: Secondary | ICD-10-CM | POA: Diagnosis not present

## 2015-10-20 ENCOUNTER — Encounter (HOSPITAL_COMMUNITY): Payer: Self-pay

## 2015-10-20 ENCOUNTER — Emergency Department (HOSPITAL_COMMUNITY): Payer: Medicare Other

## 2015-10-20 ENCOUNTER — Emergency Department (HOSPITAL_COMMUNITY)
Admission: EM | Admit: 2015-10-20 | Discharge: 2015-10-20 | Disposition: A | Discharge status: Home / Self Care | Payer: Medicare Other | Attending: Emergency Medicine | Admitting: Emergency Medicine

## 2015-10-20 DIAGNOSIS — E039 Hypothyroidism, unspecified: Secondary | ICD-10-CM | POA: Insufficient documentation

## 2015-10-20 DIAGNOSIS — M25562 Pain in left knee: Secondary | ICD-10-CM | POA: Insufficient documentation

## 2015-10-20 DIAGNOSIS — E785 Hyperlipidemia, unspecified: Secondary | ICD-10-CM | POA: Insufficient documentation

## 2015-10-20 DIAGNOSIS — I259 Chronic ischemic heart disease, unspecified: Secondary | ICD-10-CM | POA: Diagnosis not present

## 2015-10-20 DIAGNOSIS — R259 Unspecified abnormal involuntary movements: Secondary | ICD-10-CM | POA: Diagnosis not present

## 2015-10-20 DIAGNOSIS — M1990 Unspecified osteoarthritis, unspecified site: Secondary | ICD-10-CM | POA: Diagnosis not present

## 2015-10-20 DIAGNOSIS — W19XXXA Unspecified fall, initial encounter: Secondary | ICD-10-CM

## 2015-10-20 DIAGNOSIS — Y999 Unspecified external cause status: Secondary | ICD-10-CM | POA: Insufficient documentation

## 2015-10-20 DIAGNOSIS — I10 Essential (primary) hypertension: Secondary | ICD-10-CM | POA: Diagnosis not present

## 2015-10-20 DIAGNOSIS — Y9384 Activity, sleeping: Secondary | ICD-10-CM | POA: Insufficient documentation

## 2015-10-20 DIAGNOSIS — S299XXA Unspecified injury of thorax, initial encounter: Secondary | ICD-10-CM | POA: Diagnosis not present

## 2015-10-20 DIAGNOSIS — Z7982 Long term (current) use of aspirin: Secondary | ICD-10-CM | POA: Insufficient documentation

## 2015-10-20 DIAGNOSIS — Z951 Presence of aortocoronary bypass graft: Secondary | ICD-10-CM | POA: Insufficient documentation

## 2015-10-20 DIAGNOSIS — Y9289 Other specified places as the place of occurrence of the external cause: Secondary | ICD-10-CM | POA: Insufficient documentation

## 2015-10-20 DIAGNOSIS — W08XXXA Fall from other furniture, initial encounter: Secondary | ICD-10-CM | POA: Insufficient documentation

## 2015-10-20 DIAGNOSIS — R600 Localized edema: Secondary | ICD-10-CM | POA: Diagnosis not present

## 2015-10-20 DIAGNOSIS — Z87891 Personal history of nicotine dependence: Secondary | ICD-10-CM | POA: Diagnosis not present

## 2015-10-20 LAB — URINALYSIS, ROUTINE W REFLEX MICROSCOPIC
Bilirubin Urine: NEGATIVE
Glucose, UA: NEGATIVE mg/dL
Hgb urine dipstick: NEGATIVE
KETONES UR: NEGATIVE mg/dL
Leukocytes, UA: NEGATIVE
NITRITE: NEGATIVE
Protein, ur: NEGATIVE mg/dL
Specific Gravity, Urine: 1.018 (ref 1.005–1.030)
pH: 7.5 (ref 5.0–8.0)

## 2015-10-20 LAB — I-STAT CHEM 8, ED
BUN: 18 mg/dL (ref 6–20)
CHLORIDE: 101 mmol/L (ref 101–111)
Calcium, Ion: 1.16 mmol/L (ref 1.13–1.30)
Creatinine, Ser: 0.9 mg/dL (ref 0.44–1.00)
Glucose, Bld: 79 mg/dL (ref 65–99)
HCT: 42 % (ref 36.0–46.0)
HEMOGLOBIN: 14.3 g/dL (ref 12.0–15.0)
POTASSIUM: 3.2 mmol/L — AB (ref 3.5–5.1)
SODIUM: 141 mmol/L (ref 135–145)
TCO2: 25 mmol/L (ref 0–100)

## 2015-10-20 MED ORDER — ACETAMINOPHEN 325 MG PO TABS
650.0000 mg | ORAL_TABLET | Freq: Once | ORAL | Status: DC
  Filled 2015-10-20: qty 2

## 2015-10-20 MED ORDER — POTASSIUM CHLORIDE CRYS ER 20 MEQ PO TBCR
40.0000 meq | EXTENDED_RELEASE_TABLET | Freq: Once | ORAL | Status: AC
  Administered 2015-10-20: 40 meq via ORAL
  Filled 2015-10-20: qty 2

## 2015-10-20 NOTE — ED Provider Notes (Signed)
CSN: [id number]     Arrival date & time 10/20/15  0726 History   First MD Initiated Contact with Patient 10/20/15 [phone]     Chief Complaint  Patient presents with  . Fall     (Consider location/radiation/quality/duration/timing/severity/associated sxs/prior Treatment) HPI   Natalie Bullock is a 82 y.o. female who presents for evaluation of fall from couch. She recalls sleeping on a couch last night, and awoke on the floor. She is not sure how long she was on the floor. She activated her life alert, and EMS arrived and thereupon transferred her here. She denies injury. She denies recent fever, chills, nausea, vomiting, cough, chest pain, weakness or dizziness. She is taking her usual medications.She has preceding bilateral knee pain for which she uses heat on them frequently. There are no other known modifying factors.   Past Medical History  Diagnosis Date  . IHD (ischemic heart disease)     with CABG in August 2002  . Hypertension   . Hypothyroidism   . Vitamin D deficiency   . Hyperlipidemia   . Anemia   . Hx of CABG     pt states she had 5 bypasses  . Breast cancer ([place]) 1975    Right sided mastectomy  . Coronary artery disease   . Myocardial infarction ([facility])     Non Q-wave MI  . Arthritis     osteoarthritis  . H/O hypokalemia    Past Surgical History  Procedure Laterality Date  . Coronary artery bypass graft  12/2000    LEFT INTERNAL MAMMARY TO THE LAD, SAPHENOUS VEIN GRAFT TO THE FIRST DIAGONAL, SAPHENOUS VEIN GRAFT TO THE RAMUS INTERMEDIATE, AND A SEQUENTIAL SAPHENOUS VEIN GRAFT TO THE POSTERIOR DESCENDING AND POSTERIOR LATERAL BRANCHES.  . Mastectomy  1975    RIGHT SIDE  . Cardiovascular stress test  Aug 2010    Normal; EF 74%  . Cardiac catheterization  01/20/2001    NORMAL. EF 60-70%  . Tubal ligation      1960's  . Breast surgery      left breast cyst surgery   . Mass excision  04/08/2011    Procedure: EXCISION MASS;  Surgeon: Wynonia Sours, MD;  Location: [facility];  Service: Orthopedics;  Laterality: Left;  excision mass left palm   Family History  Problem Relation Age of Onset  . Alzheimer's disease Mother    Social History  Substance Use Topics  . Smoking status: Former Smoker -- 1.00 packs/day for 4 years    Types: Cigarettes    Quit date: 06/02/1969  . Smokeless tobacco: Never Used  . Alcohol Use: No   OB History    No data available     Review of Systems  All other systems reviewed and are negative.     Allergies  Valium; Codeine; Crestor; Doxycycline; Ezetimibe-simvastatin; Lipitor; Sulfa drugs cross reactors; Tequin; and Zocor  Home Medications   Prior to Admission medications   Medication Sig Start Date End Date Taking? Authorizing Provider  amLODipine (NORVASC) 10 MG tablet Take 10 mg by mouth daily.    Historical Provider, MD  aspirin 81 MG tablet Take 81 mg by mouth daily.     Historical Provider, MD  feeding supplement (ENSURE COMPLETE) LIQD Take 237 mLs by mouth 2 (two) times daily between meals. 09/16/11   Barton Dubois, MD  levothyroxine (SYNTHROID, LEVOTHROID) 50 MCG tablet Take 50 mcg by mouth daily before breakfast.     Historical Provider, MD  Polyethyl Glycol-Propyl Glycol (SYSTANE OP) Place 2 drops into both eyes every morning. For eye dryness.    Historical Provider, MD  pravastatin (PRAVACHOL) 20 MG tablet TAKE 1 TABLET (20 MG TOTAL) BY MOUTH AT BEDTIME. 08/02/15   Lelon Perla, MD   BP 165/73 mmHg  Pulse 49  Temp(Src) 98.5 F (36.9 C) (Oral)  Resp 18  SpO2 98% Physical Exam  Constitutional: She is oriented to person, place, and time. She appears well-developed.  Elderly, frail  HENT:  Head: Normocephalic and atraumatic.  Right Ear: External ear normal.  Left Ear: External ear normal.  Eyes: Conjunctivae and EOM are normal. Pupils are equal, round, and reactive to light.  Neck: Normal range of motion and phonation normal. Neck supple.  Cardiovascular: Normal rate, regular rhythm  and normal heart sounds.   Pulmonary/Chest: Effort normal and breath sounds normal. She exhibits no bony tenderness.  Abdominal: Soft. There is no tenderness.  Musculoskeletal: She exhibits edema (3+ edema bilateral.).  Decreased range of motion left knee secondary to pain, mild effusion, left knee. Fair range of motion of the right leg.  Neurological: She is alert and oriented to person, place, and time. No cranial nerve deficit or sensory deficit. She exhibits normal muscle tone. Coordination normal.  Skin: Skin is warm, dry and intact.  Psychiatric: She has a normal mood and affect. Her behavior is normal. Judgment and thought content normal.  Nursing note and vitals reviewed.   ED Course  Procedures (including critical care time)  Initial clinical impression- low risk fall mechanism for injury. Essentially normal physical exam. Screening evaluation ordered to assess for complicating factors.  Medications  potassium chloride SA (K-DUR,KLOR-CON) CR tablet 40 mEq (40 mEq Oral Given 10/20/15 0938)    Patient Vitals for the past 24 hrs:  BP Temp Temp src Pulse Resp SpO2  10/20/15 1015 165/73 mmHg - - (!) 49 18 98 %  10/20/15 1000 185/73 mmHg - - (!) 53 15 100 %  10/20/15 0930 149/58 mmHg - - (!) 46 11 99 %  10/20/15 0900 123/98 mmHg - - (!) 52 14 100 %  10/20/15 0800 170/60 mmHg - - (!) 59 19 100 %  10/20/15 0745 (!) 158/107 mmHg - - 67 21 95 %  10/20/15 0739 (!) 173/52 mmHg 98.5 F (36.9 C) Oral (!) 55 18 100 %  10/20/15 0726 - - - - - 98 %    10:35 AM Reevaluation with update and discussion. After initial assessment and treatment, an updated evaluation reveals No additional complaints. She was able to ambulate using a walker. Findings discussed with patient, all questions were answered. [place] Review Labs Reviewed  I-STAT CHEM 8, ED - Abnormal; Notable for the following:    Potassium 3.2 (*)    All other components within normal limits  URINALYSIS, ROUTINE W  REFLEX MICROSCOPIC (NOT AT [facility])    Imaging Review Dg Chest 2 View  10/20/2015  CLINICAL DATA:  Fall EXAM: CHEST  2 VIEW COMPARISON:  01/20/2014 FINDINGS: Lungs are clear.  No pleural effusion or pneumothorax. Cardiomegaly.  Postsurgical changes related to prior CABG. Median sternotomy. IMPRESSION: No evidence of acute cardiopulmonary disease. Electronically Signed   By: Julian Hy M.D.   On: 10/20/2015 08:40   I have personally reviewed and evaluated these images and lab results as part of my medical decision-making.   EKG Interpretation   Date/Time:  Saturday Oct 20 2015 07:35:47 EDT Ventricular Rate:  58 PR  Interval:  160 QRS Duration: 93 QT Interval:  442 QTC Calculation: 434 R Axis:   20 Text Interpretation:  Sinus rhythm Since last tracing rate slower and  PR  interval is now normal Confirmed by Eulis Foster  MD, Emon Lance [id number]) on  10/20/2015 7:44:49 AM      MDM   Final diagnoses:  Fall, initial encounter  Left knee pain    Fall from couch without serious injury. Ongoing left knee pain, likely related to arthritis. Doubt ACS ,serious bacterial infection. metabolic instability or impending vascular collapse.  Nursing Notes Reviewed/ Care Coordinated Applicable Imaging Reviewed Interpretation of Laboratory Data incorporated into ED treatment  The patient appears reasonably screened and/or stabilized for discharge and I doubt any other medical condition or other [facility] requiring further screening, evaluation, or treatment in the ED at this time prior to discharge.  Plan: Home Medications- Tylenol; Home Treatments- heat to knees, knee sleeve prn; return here if the recommended treatment, does not improve the symptoms; Recommended follow up- PCP prn   Daleen Bo, MD 10/20/15 1043

## 2015-10-20 NOTE — ED Notes (Signed)
MD updated about patient's ambulation

## 2015-10-20 NOTE — ED Notes (Signed)
Pt. Coming from home via [facility] c/o bilateral leg pain after a possible fall. Pt. Reports falling asleep on the couch last night and waking up in the floor. Pt. Used medical alarm to call out for help. Pt. Lives alone and is unsure how she got in the floor/how long shes been in the floor. [facility] reports no deformities/shortening of lower extremities. Pt. Last meal 1800 last night

## 2015-10-20 NOTE — ED Notes (Signed)
Placed pt in a brief

## 2015-10-20 NOTE — ED Notes (Signed)
Phlebotomy at the bedside  

## 2015-10-20 NOTE — ED Notes (Signed)
Pt. Ambulated in hall, used a walker and favored the left leg. She complained of left knee pain but was steady walking.

## 2015-10-20 NOTE — Discharge Instructions (Signed)
Take Tylenol every 4 hours if needed for pain. Try using heat on the left knee to help the discomfort. Use the knee sleeve, to help the left knee pain.    Heat Therapy Heat therapy can help ease sore, stiff, injured, and tight muscles and joints. Heat relaxes your muscles, which may help ease your pain.  RISKS AND COMPLICATIONS If you have any of the following conditions, do not use heat therapy unless your health care provider has approved:  Poor circulation.  Healing wounds or scarred skin in the area being treated.  Diabetes, heart disease, or high blood pressure.  Not being able to feel (numbness) the area being treated.  Unusual swelling of the area being treated.  Active infections.  Blood clots.  Cancer.  Inability to communicate pain. This may include young children and people who have problems with their brain function (dementia).  Pregnancy. Heat therapy should only be used on old, pre-existing, or long-lasting (chronic) injuries. Do not use heat therapy on new injuries unless directed by your health care provider. HOW TO USE HEAT THERAPY There are several different kinds of heat therapy, including:  Moist heat pack.  Warm water bath.  Hot water bottle.  Electric heating pad.  Heated gel pack.  Heated wrap.  Electric heating pad. Use the heat therapy method suggested by your health care provider. Follow your health care provider's instructions on when and how to use heat therapy. GENERAL HEAT THERAPY RECOMMENDATIONS  Do not sleep while using heat therapy. Only use heat therapy while you are awake.  Your skin may turn pink while using heat therapy. Do not use heat therapy if your skin turns red.  Do not use heat therapy if you have new pain.  High heat or long exposure to heat can cause burns. Be careful when using heat therapy to avoid burning your skin.  Do not use heat therapy on areas of your skin that are already irritated, such as with a rash  or sunburn. SEEK MEDICAL CARE IF:  You have blisters, redness, swelling, or numbness.  You have new pain.  Your pain is worse. MAKE SURE YOU:  Understand these instructions.  Will watch your condition.  Will get help right away if you are not doing well or get worse.   This information is not intended to replace advice given to you by your health care provider. Make sure you discuss any questions you have with your health care provider.   Document Released: 08/11/2011 Document Revised: 06/09/2014 Document Reviewed: 07/12/2013 Elsevier Interactive Patient Education [employer].

## 2015-10-20 NOTE — ED Notes (Signed)
Pt complaining of leg cramps. Pt given heating packs to help with complaint

## 2015-10-30 DIAGNOSIS — E039 Hypothyroidism, unspecified: Secondary | ICD-10-CM | POA: Diagnosis not present

## 2015-10-30 DIAGNOSIS — E876 Hypokalemia: Secondary | ICD-10-CM | POA: Diagnosis not present

## 2015-10-30 DIAGNOSIS — M1990 Unspecified osteoarthritis, unspecified site: Secondary | ICD-10-CM | POA: Diagnosis not present

## 2015-10-30 DIAGNOSIS — I10 Essential (primary) hypertension: Secondary | ICD-10-CM | POA: Diagnosis not present

## 2015-10-30 DIAGNOSIS — E781 Pure hyperglyceridemia: Secondary | ICD-10-CM | POA: Diagnosis not present

## 2016-01-09 DIAGNOSIS — I10 Essential (primary) hypertension: Secondary | ICD-10-CM | POA: Diagnosis not present

## 2016-01-09 DIAGNOSIS — E039 Hypothyroidism, unspecified: Secondary | ICD-10-CM | POA: Diagnosis not present

## 2016-01-09 DIAGNOSIS — E781 Pure hyperglyceridemia: Secondary | ICD-10-CM | POA: Diagnosis not present

## 2016-02-01 ENCOUNTER — Other Ambulatory Visit: Payer: Self-pay | Admitting: Cardiology

## 2016-02-01 NOTE — Telephone Encounter (Signed)
Rx(s) sent to pharmacy electronically.  

## 2016-03-20 DIAGNOSIS — Z23 Encounter for immunization: Secondary | ICD-10-CM | POA: Diagnosis not present

## 2016-05-06 DIAGNOSIS — E039 Hypothyroidism, unspecified: Secondary | ICD-10-CM | POA: Diagnosis not present

## 2016-05-06 DIAGNOSIS — E781 Pure hyperglyceridemia: Secondary | ICD-10-CM | POA: Diagnosis not present

## 2016-05-06 DIAGNOSIS — I10 Essential (primary) hypertension: Secondary | ICD-10-CM | POA: Diagnosis not present

## 2016-09-02 DIAGNOSIS — E781 Pure hyperglyceridemia: Secondary | ICD-10-CM | POA: Diagnosis not present

## 2016-09-02 DIAGNOSIS — E559 Vitamin D deficiency, unspecified: Secondary | ICD-10-CM | POA: Diagnosis not present

## 2016-09-02 DIAGNOSIS — E039 Hypothyroidism, unspecified: Secondary | ICD-10-CM | POA: Diagnosis not present

## 2016-09-02 DIAGNOSIS — I10 Essential (primary) hypertension: Secondary | ICD-10-CM | POA: Diagnosis not present

## 2016-09-02 DIAGNOSIS — R7302 Impaired glucose tolerance (oral): Secondary | ICD-10-CM | POA: Diagnosis not present

## 2016-09-08 ENCOUNTER — Encounter: Payer: Self-pay | Admitting: Cardiology

## 2016-09-15 NOTE — Progress Notes (Signed)
HPI: FU CAD and syncope. She has h/o CABG in 2002 and HTN. Patient has had difficulties with recurrent syncope. Last Myoview 10/12: No scar or ischemia, EF 74%. Carotid Dopplers March 2015 showed no significant stenosis. Event monitor 9/13 demonstrated no significant arrhythmias. Patient was seen by Dr. Rayann Heman 10/14 for syncope and her diuretics were discontinued. Patient did have some bradycardia on telemetry that was felt unrelated to her syncopal episodes. She was offered an implantable loop but she declined. Echocardiogram repeated in October 2015 and showed normal LV function, mild left atrial enlargement and mild mitral regurgitation. Since last seen, patient denies dyspnea, chest pain, palpitations or recurrent syncope.   Current Outpatient Prescriptions  Medication Sig Dispense Refill  . amLODipine (NORVASC) 10 MG tablet Take 10 mg by mouth daily.    Marland Kitchen amLODipine (NORVASC) 5 MG tablet Take 5 mg by mouth daily.  3  . Ascorbic Acid (VITAMIN C IMMUNE HEALTH PO) Take 1 tablet by mouth daily.    Marland Kitchen aspirin 81 MG tablet Take 81 mg by mouth daily.     Marland Kitchen levothyroxine (SYNTHROID, LEVOTHROID) 50 MCG tablet Take 50 mcg by mouth daily before breakfast.     . Polyethyl Glycol-Propyl Glycol (SYSTANE OP) Place 2 drops into both eyes every morning. For eye dryness.    . pravastatin (PRAVACHOL) 20 MG tablet TAKE 1 TABLET (20 MG TOTAL) BY MOUTH AT BEDTIME. 90 tablet 2  . triamterene-hydrochlorothiazide (MAXZIDE-25) 37.5-25 MG tablet Take 1 tablet by mouth daily.  3   No current facility-administered medications for this visit.      Past Medical History:  Diagnosis Date  . Anemia   . Arthritis    osteoarthritis  . Breast cancer ([place]) 1975   Right sided mastectomy  . Coronary artery disease   . H/O hypokalemia   . Hx of CABG    pt states she had 5 bypasses  . Hyperlipidemia   . Hypertension   . Hypothyroidism   . IHD (ischemic heart disease)    with CABG in August 2002  . Myocardial  infarction (HCC)    Non Q-wave MI  . Vitamin D deficiency     Past Surgical History:  Procedure Laterality Date  . BREAST SURGERY     left breast cyst surgery   . CARDIAC CATHETERIZATION  01/20/2001   NORMAL. EF 60-70%  . CARDIOVASCULAR STRESS TEST  Aug 2010   Normal; EF 74%  . CORONARY ARTERY BYPASS GRAFT  12/2000   LEFT INTERNAL MAMMARY TO THE LAD, SAPHENOUS VEIN GRAFT TO THE FIRST DIAGONAL, SAPHENOUS VEIN GRAFT TO THE RAMUS INTERMEDIATE, AND A SEQUENTIAL SAPHENOUS VEIN GRAFT TO THE POSTERIOR DESCENDING AND POSTERIOR LATERAL BRANCHES.  Marland Kitchen MASS EXCISION  04/08/2011   Procedure: EXCISION MASS;  Surgeon: Wynonia Sours, MD;  Location: [facility];  Service: Orthopedics;  Laterality: Left;  excision mass left palm  . MASTECTOMY  1975   RIGHT SIDE  . TUBAL LIGATION     1960's    Social History   Social History  . Marital status: Widowed    Spouse name: N/A  . Number of children: N/A  . Years of education: N/A   Occupational History  . Not on file.   Social History Main Topics  . Smoking status: Former Smoker    Packs/day: 1.00    Years: 4.00    Types: Cigarettes    Quit date: 06/02/1969  . Smokeless tobacco: Never Used  . Alcohol use  No  . Drug use: No  . Sexual activity: No   Other Topics Concern  . Not on file   Social History Narrative  . No narrative on file    Family History  Problem Relation Age of Onset  . Alzheimer's disease Mother     ROS: no fevers or chills, productive cough, hemoptysis, dysphasia, odynophagia, melena, hematochezia, dysuria, hematuria, rash, seizure activity, orthopnea, PND, pedal edema, claudication. Remaining systems are negative.  Physical Exam: Well-developed well-nourished in no acute distress.  Skin is warm and dry.  HEENT is normal.  Neck is supple.  Chest is clear to auscultation with normal expansion. No wheeze. Status post sternotomy. Cardiovascular exam is regular rate and rhythm. 1/6 systolic  murmur. Abdominal exam nontender or distended. No masses palpated. Extremities show trace edema. neuro grossly intact  ECG- Sinus rhythm at a rate of 70. Left ventricular hypertrophy. Nonspecific ST changes. personally reviewed  A/P  1 Coronary artery disease-continue aspirin and statin.  2 history of syncope-no recent episodes.  3 hypertension-blood pressure borderline. She has a history of syncope and has been placed back on diuretics by primary care for pedal edema. I will discontinue amlodipine to allow her blood pressure to run higher and hopefully avoid recurrent syncopal episodes. She will track her blood pressure at home and we will adjust her regimen as needed.  4 hyperlipidemia-continue statin. Lipids and liver monitored by primary care.  Kirk Ruths, MD

## 2016-09-25 ENCOUNTER — Ambulatory Visit (INDEPENDENT_AMBULATORY_CARE_PROVIDER_SITE_OTHER): Payer: Medicare Other | Admitting: Cardiology

## 2016-09-25 ENCOUNTER — Encounter: Payer: Self-pay | Admitting: Cardiology

## 2016-09-25 VITALS — BP 102/68 | HR 70 | Ht 64.0 in | Wt 140.0 lb

## 2016-09-25 DIAGNOSIS — I10 Essential (primary) hypertension: Secondary | ICD-10-CM

## 2016-09-25 DIAGNOSIS — E7800 Pure hypercholesterolemia, unspecified: Secondary | ICD-10-CM

## 2016-09-25 DIAGNOSIS — I25810 Atherosclerosis of coronary artery bypass graft(s) without angina pectoris: Secondary | ICD-10-CM

## 2016-09-25 NOTE — Patient Instructions (Signed)
Medication Instructions:   STOP AMLODIPINE  Follow-Up:  Your physician wants you to follow-up in: ONE YEAR WITH DR CRENSHAW You will receive a reminder letter in the mail two months in advance. If you don't receive a letter, please call our office to schedule the follow-up appointment.   If you need a refill on your cardiac medications before your next appointment, please call your pharmacy.    

## 2016-10-25 ENCOUNTER — Other Ambulatory Visit: Payer: Self-pay | Admitting: Cardiology

## 2017-01-06 DIAGNOSIS — E039 Hypothyroidism, unspecified: Secondary | ICD-10-CM | POA: Diagnosis not present

## 2017-01-06 DIAGNOSIS — E781 Pure hyperglyceridemia: Secondary | ICD-10-CM | POA: Diagnosis not present

## 2017-01-06 DIAGNOSIS — I10 Essential (primary) hypertension: Secondary | ICD-10-CM | POA: Diagnosis not present

## 2017-02-25 DIAGNOSIS — Z23 Encounter for immunization: Secondary | ICD-10-CM | POA: Diagnosis not present

## 2017-05-19 DIAGNOSIS — E039 Hypothyroidism, unspecified: Secondary | ICD-10-CM | POA: Diagnosis not present

## 2017-05-19 DIAGNOSIS — E781 Pure hyperglyceridemia: Secondary | ICD-10-CM | POA: Diagnosis not present

## 2017-05-19 DIAGNOSIS — I10 Essential (primary) hypertension: Secondary | ICD-10-CM | POA: Diagnosis not present

## 2017-08-14 ENCOUNTER — Observation Stay (HOSPITAL_COMMUNITY)
Admission: EM | Admit: 2017-08-14 | Discharge: 2017-08-16 | Disposition: A | Discharge status: Home / Self Care | Payer: Medicare Other | Attending: Internal Medicine | Admitting: Internal Medicine

## 2017-08-14 ENCOUNTER — Other Ambulatory Visit: Payer: Self-pay

## 2017-08-14 ENCOUNTER — Encounter (HOSPITAL_COMMUNITY): Payer: Self-pay

## 2017-08-14 DIAGNOSIS — R42 Dizziness and giddiness: Secondary | ICD-10-CM | POA: Insufficient documentation

## 2017-08-14 DIAGNOSIS — R55 Syncope and collapse: Secondary | ICD-10-CM | POA: Diagnosis present

## 2017-08-14 DIAGNOSIS — R404 Transient alteration of awareness: Secondary | ICD-10-CM | POA: Diagnosis not present

## 2017-08-14 DIAGNOSIS — Z23 Encounter for immunization: Secondary | ICD-10-CM | POA: Insufficient documentation

## 2017-08-14 DIAGNOSIS — E785 Hyperlipidemia, unspecified: Secondary | ICD-10-CM | POA: Diagnosis not present

## 2017-08-14 DIAGNOSIS — E039 Hypothyroidism, unspecified: Secondary | ICD-10-CM | POA: Diagnosis not present

## 2017-08-14 DIAGNOSIS — Z7982 Long term (current) use of aspirin: Secondary | ICD-10-CM | POA: Insufficient documentation

## 2017-08-14 DIAGNOSIS — I951 Orthostatic hypotension: Principal | ICD-10-CM | POA: Insufficient documentation

## 2017-08-14 DIAGNOSIS — I2510 Atherosclerotic heart disease of native coronary artery without angina pectoris: Secondary | ICD-10-CM | POA: Insufficient documentation

## 2017-08-14 DIAGNOSIS — Z79899 Other long term (current) drug therapy: Secondary | ICD-10-CM | POA: Diagnosis not present

## 2017-08-14 DIAGNOSIS — Z87891 Personal history of nicotine dependence: Secondary | ICD-10-CM | POA: Diagnosis not present

## 2017-08-14 DIAGNOSIS — I10 Essential (primary) hypertension: Secondary | ICD-10-CM | POA: Diagnosis present

## 2017-08-14 DIAGNOSIS — I252 Old myocardial infarction: Secondary | ICD-10-CM | POA: Diagnosis not present

## 2017-08-14 DIAGNOSIS — M1990 Unspecified osteoarthritis, unspecified site: Secondary | ICD-10-CM | POA: Diagnosis not present

## 2017-08-14 DIAGNOSIS — Z853 Personal history of malignant neoplasm of breast: Secondary | ICD-10-CM

## 2017-08-14 DIAGNOSIS — Z951 Presence of aortocoronary bypass graft: Secondary | ICD-10-CM | POA: Diagnosis present

## 2017-08-14 LAB — COMPREHENSIVE METABOLIC PANEL
ALBUMIN: 4.1 g/dL (ref 3.5–5.0)
ALT: 11 U/L — AB (ref 14–54)
ANION GAP: 12 (ref 5–15)
AST: 18 U/L (ref 15–41)
Alkaline Phosphatase: 35 U/L — ABNORMAL LOW (ref 38–126)
BILIRUBIN TOTAL: 0.7 mg/dL (ref 0.3–1.2)
BUN: 13 mg/dL (ref 6–20)
CALCIUM: 9.8 mg/dL (ref 8.9–10.3)
CO2: 23 mmol/L (ref 22–32)
CREATININE: 0.72 mg/dL (ref 0.44–1.00)
Chloride: 105 mmol/L (ref 101–111)
GFR calc Af Amer: >60 mL/min (ref >60)
GFR calc non Af Amer: >60 mL/min (ref >60)
Glucose, Bld: 93 mg/dL (ref 65–99)
Potassium: 3.6 mmol/L (ref 3.5–5.1)
SODIUM: 140 mmol/L (ref 135–145)
TOTAL PROTEIN: 7.2 g/dL (ref 6.5–8.1)

## 2017-08-14 LAB — CBC WITH DIFFERENTIAL/PLATELET
BASOS PCT: 0 %
Basophils Absolute: 0 10*3/uL (ref 0.0–0.1)
EOS ABS: 0 10*3/uL (ref 0.0–0.7)
Eosinophils Relative: 0 %
HEMATOCRIT: 35.2 % — AB (ref 36.0–46.0)
HEMOGLOBIN: 11.2 g/dL — AB (ref 12.0–15.0)
LYMPHS ABS: 0.8 10*3/uL (ref 0.7–4.0)
Lymphocytes Relative: 16 %
MCH: 28.4 pg (ref 26.0–34.0)
MCHC: 31.8 g/dL (ref 30.0–36.0)
MCV: 89.1 fL (ref 78.0–100.0)
Monocytes Absolute: 0.2 10*3/uL (ref 0.1–1.0)
Monocytes Relative: 4 %
NEUTROS ABS: 4.3 10*3/uL (ref 1.7–7.7)
NEUTROS PCT: 80 %
Platelets: 178 10*3/uL (ref 150–400)
RBC: 3.95 MIL/uL (ref 3.87–5.11)
RDW: 14.4 % (ref 11.5–15.5)
WBC: 5.3 10*3/uL (ref 4.0–10.5)

## 2017-08-14 LAB — I-STAT CHEM 8, ED
BUN: 14 mg/dL (ref 6–20)
CALCIUM ION: 1.18 mmol/L (ref 1.15–1.40)
CREATININE: 0.6 mg/dL (ref 0.44–1.00)
Chloride: 105 mmol/L (ref 101–111)
GLUCOSE: 89 mg/dL (ref 65–99)
HCT: 36 % (ref 36.0–46.0)
Hemoglobin: 12.2 g/dL (ref 12.0–15.0)
POTASSIUM: 3.6 mmol/L (ref 3.5–5.1)
Sodium: 141 mmol/L (ref 135–145)
TCO2: 25 mmol/L (ref 22–32)

## 2017-08-14 LAB — I-STAT TROPONIN, ED: TROPONIN I, POC: 0 ng/mL (ref 0.00–0.08)

## 2017-08-14 MED ORDER — SODIUM CHLORIDE 0.9 % IV BOLUS (SEPSIS)
1000.0000 mL | Freq: Once | INTRAVENOUS | Status: AC
  Administered 2017-08-14: 1000 mL via INTRAVENOUS

## 2017-08-14 NOTE — ED Notes (Signed)
Pt unable to stand for orthostatics, states feeling "too dizzy"

## 2017-08-14 NOTE — ED Triage Notes (Signed)
Pt arrived via [facility] from home after pressing her call bell/ alert bell that she wears around her neck. Stated that she felt like she was going to pass out if she did not sit down. States this episode happened when she was getting up. States that she had one episode of nausea and vomiting. Per EMS, pt has significant cardiac history.

## 2017-08-14 NOTE — ED Provider Notes (Signed)
[facility] EMERGENCY DEPARTMENT Provider Note  CSN: [id number] Arrival date & time: 08/14/17 1651  Chief Complaint(s) Near Syncope  HPI Natalie Bullock is a 84 y.o. female   The history is provided by the patient.  Near Syncope  This is a recurrent problem. The current episode started 1 to 2 hours ago. Episode frequency: once. The problem has been resolved. Pertinent negatives include no chest pain, no abdominal pain, no headaches and no shortness of breath. The symptoms are aggravated by standing. Relieved by: sitting. She has tried nothing for the symptoms.   Reports similar presentations in the past causing syncope.  States that previously these occurred spontaneously however today's episode occurred while she was standing up from a sitting position.  Relieved by sitting down.  Patient denies any recent fevers or infections.  She denies any nausea or vomiting.  She did report an episode of regurgitation.  No chest pain or shortness of breath.  No abdominal pain.  No diarrhea.  No urinary symptoms.  Patient reports that she is on medicine that makes her get up multiple times during the night to urinate.   Past Medical History Past Medical History:  Diagnosis Date  . Anemia   . Arthritis    osteoarthritis  . Breast cancer ([place]) 1975   Right sided mastectomy  . Coronary artery disease   . H/O hypokalemia   . Hx of CABG    pt states she had 5 bypasses  . Hyperlipidemia   . Hypertension   . Hypothyroidism   . IHD (ischemic heart disease)    with CABG in August 2002  . Myocardial infarction (HCC)    Non Q-wave MI  . Vitamin D deficiency    Patient Active Problem List   Diagnosis Date Noted  . Mitral regurgitation 03/07/2014  . Near syncope 03/26/2013  . Syncope 05/19/2012  . Delirium 09/12/2011  . HTN (hypertension) 08/17/2011  . HX: breast cancer 08/14/2011  . Hypothyroidism 08/14/2011  . CAD (coronary artery disease) 11/19/2010  . Hyperlipidemia  11/19/2010   Home Medication(s) Prior to Admission medications   Medication Sig Start Date End Date Taking? Authorizing Provider  Ascorbic Acid (VITAMIN C IMMUNE HEALTH PO) Take 1 tablet by mouth daily.   Yes [provider]  aspirin 81 MG tablet Take 81 mg by mouth daily.    Yes [provider]  levothyroxine (SYNTHROID, LEVOTHROID) 50 MCG tablet Take 50 mcg by mouth daily before breakfast.    Yes [provider]  Polyethyl Glycol-Propyl Glycol (SYSTANE OP) Place 2 drops into both eyes every morning. For eye dryness.   Yes [provider]  pravastatin (PRAVACHOL) 20 MG tablet TAKE 1 TABLET (20 MG TOTAL) BY MOUTH AT BEDTIME. 10/28/16  Yes Crenshaw, Denice Bors, MD  triamterene-hydrochlorothiazide (MAXZIDE-25) 37.5-25 MG tablet Take 1 tablet by mouth daily. 08/02/16  Yes [provider]  Past Surgical History Past Surgical History:  Procedure Laterality Date  . BREAST SURGERY     left breast cyst surgery   . CARDIAC CATHETERIZATION  01/20/2001   NORMAL. EF 60-70%  . CARDIOVASCULAR STRESS TEST  Aug 2010   Normal; EF 74%  . CORONARY ARTERY BYPASS GRAFT  12/2000   LEFT INTERNAL MAMMARY TO THE LAD, SAPHENOUS VEIN GRAFT TO THE FIRST DIAGONAL, SAPHENOUS VEIN GRAFT TO THE RAMUS INTERMEDIATE, AND A SEQUENTIAL SAPHENOUS VEIN GRAFT TO THE POSTERIOR DESCENDING AND POSTERIOR LATERAL BRANCHES.  Marland Kitchen MASS EXCISION  04/08/2011   Procedure: EXCISION MASS;  Surgeon: Wynonia Sours, MD;  Location: [facility];  Service: Orthopedics;  Laterality: Left;  excision mass left palm  . MASTECTOMY  1975   RIGHT SIDE  . TUBAL LIGATION     1960's   Family History Family History  Problem Relation Age of Onset  . Alzheimer's disease Mother     Social History Social History   Tobacco Use  . Smoking status: Former Smoker    Packs/day:  1.00    Years: 4.00    Pack years: 4.00    Types: Cigarettes    Last attempt to quit: 06/02/1969    Years since quitting: 48.2  . Smokeless tobacco: Never Used  Substance Use Topics  . Alcohol use: No  . Drug use: No   Allergies Valium; Codeine; Crestor [rosuvastatin calcium]; Doxycycline; Ezetimibe-simvastatin; Lipitor [atorvastatin calcium]; Sulfa drugs cross reactors; Tequin; and Zocor [simvastatin]  Review of Systems Review of Systems  Respiratory: Negative for shortness of breath.   Cardiovascular: Positive for near-syncope. Negative for chest pain.  Gastrointestinal: Negative for abdominal pain.  Neurological: Negative for headaches.   All other systems are reviewed and are negative for acute change except as noted in the HPI  Physical Exam Vital Signs  I have reviewed the triage vital signs BP (!) 157/65   Pulse (!) 56   Resp 17   Ht 5\' 3"  (1.6 m)   Wt 63.5 kg (140 lb)   SpO2 95%   BMI 24.80 kg/m   Physical Exam  Constitutional: She is oriented to person, place, and time. She appears well-developed and well-nourished. No distress.  HENT:  Head: Normocephalic and atraumatic.  Nose: Nose normal.  Eyes: Conjunctivae and EOM are normal. Pupils are equal, round, and reactive to light. Right eye exhibits no discharge. Left eye exhibits no discharge. No scleral icterus.  Neck: Normal range of motion. Neck supple.  Cardiovascular: Normal rate and regular rhythm. Exam reveals no gallop and no friction rub.  No murmur heard. Pulmonary/Chest: Effort normal and breath sounds normal. No stridor. No respiratory distress. She has no rales.    Abdominal: Soft. She exhibits no distension. There is no tenderness.  Musculoskeletal: She exhibits no edema or tenderness.  Neurological: She is alert and oriented to person, place, and time.  Skin: Skin is warm and dry. No rash noted. She is not diaphoretic. No erythema.  Psychiatric: She has a normal mood and affect.  Vitals  reviewed.   ED Results and Treatments Labs (all labs ordered are listed, but only abnormal results are displayed) Labs Reviewed  CBC WITH DIFFERENTIAL/PLATELET - Abnormal; Notable for the following components:      Result Value   Hemoglobin 11.2 (*)    HCT 35.2 (*)    All other components within normal limits  COMPREHENSIVE METABOLIC PANEL - Abnormal; Notable for the following components:   ALT 11 (*)  Alkaline Phosphatase 35 (*)    All other components within normal limits  I-STAT CHEM 8, ED  I-STAT TROPONIN, ED  CBG MONITORING, ED                                                                                                                         EKG  EKG Interpretation  Date/Time:  Friday August 14 2017 16:53:49 EDT Ventricular Rate:  84 PR Interval:    QRS Duration: 92 QT Interval:  419 QTC Calculation: 496 R Axis:   13 Text Interpretation:  Sinus rhythm Ventricular premature complex Prolonged PR interval Left atrial enlargement Borderline prolonged QT interval Otherwise no significant change Confirmed by Addison Lank [phone]) on 08/14/2017 5:14:18 PM      Radiology No results found. Pertinent labs & imaging results that were available during my care of the patient were reviewed by me and considered in my medical decision making (see chart for details).  Medications Ordered in ED Medications  sodium chloride 0.9 % bolus 1,000 mL (0 mLs Intravenous Stopped 08/14/17 2208)  sodium chloride 0.9 % bolus 1,000 mL (0 mLs Intravenous Stopped 08/14/17 2353)                                                                                                                                    Procedures Procedures CRITICAL CARE Performed by: Grayce Sessions Kenny Stern Total critical care time: 40 minutes Critical care time was exclusive of separately billable procedures and treating other patients. Critical care was necessary to treat or prevent imminent or life-threatening  deterioration. Critical care was time spent personally by me on the following activities: development of treatment plan with patient and/or surrogate as well as nursing, discussions with consultants, evaluation of patient's response to treatment, examination of patient, obtaining history from patient or surrogate, ordering and performing treatments and interventions, ordering and review of laboratory studies, ordering and review of radiographic studies, pulse oximetry and re-evaluation of patient's condition.   (including critical care time)  Medical Decision Making / ED Course I have reviewed the nursing notes for this encounter and the patient's prior records (if available in EHR or on provided paperwork).    Near syncope.  Appears to be orthostatic in nature.  Orthostatic blood pressures here were positive.  Patient was given IV fluids.  Labs grossly reassuring with no significant electrolyte derangements, renal insufficiency, significant anemia.  After  IV fluid bolus orthostatics were repeated and patient continued to be orthostatic and symptomatic.  Additional IV fluid boluses were ordered.  Discussed case with hospitalist for admission and continued hydration.  Final Clinical Impression(s) / ED Diagnoses Final diagnoses:  Orthostasis      This chart was dictated using voice recognition software.  Despite best efforts to proofread,  errors can occur which can change the documentation meaning.   Fatima Blank, MD 08/15/17 [phone]

## 2017-08-14 NOTE — ED Notes (Signed)
ED Provider at bedside. 

## 2017-08-15 ENCOUNTER — Encounter (HOSPITAL_COMMUNITY): Payer: Self-pay | Admitting: Internal Medicine

## 2017-08-15 DIAGNOSIS — R55 Syncope and collapse: Secondary | ICD-10-CM | POA: Diagnosis not present

## 2017-08-15 DIAGNOSIS — I951 Orthostatic hypotension: Secondary | ICD-10-CM | POA: Diagnosis not present

## 2017-08-15 LAB — CBC
HCT: 36 % (ref 36.0–46.0)
HEMOGLOBIN: 11.8 g/dL — AB (ref 12.0–15.0)
MCH: 29.1 pg (ref 26.0–34.0)
MCHC: 32.8 g/dL (ref 30.0–36.0)
MCV: 88.7 fL (ref 78.0–100.0)
Platelets: 177 10*3/uL (ref 150–400)
RBC: 4.06 MIL/uL (ref 3.87–5.11)
RDW: 14.1 % (ref 11.5–15.5)
WBC: 5.6 10*3/uL (ref 4.0–10.5)

## 2017-08-15 LAB — BASIC METABOLIC PANEL
ANION GAP: 9 (ref 5–15)
BUN: 10 mg/dL (ref 6–20)
CALCIUM: 9.6 mg/dL (ref 8.9–10.3)
CO2: 26 mmol/L (ref 22–32)
Chloride: 107 mmol/L (ref 101–111)
Creatinine, Ser: 0.64 mg/dL (ref 0.44–1.00)
GFR calc Af Amer: >60 mL/min (ref >60)
Glucose, Bld: 84 mg/dL (ref 65–99)
Potassium: 3.7 mmol/L (ref 3.5–5.1)
SODIUM: 142 mmol/L (ref 135–145)

## 2017-08-15 LAB — CORTISOL: CORTISOL PLASMA: 14 ug/dL

## 2017-08-15 MED ORDER — LEVOTHYROXINE SODIUM 50 MCG PO TABS
50.0000 ug | ORAL_TABLET | Freq: Every day | ORAL | Status: DC
  Administered 2017-08-15: 50 ug via ORAL
  Filled 2017-08-15: qty 1

## 2017-08-15 MED ORDER — ASPIRIN EC 81 MG PO TBEC
81.0000 mg | DELAYED_RELEASE_TABLET | Freq: Every day | ORAL | Status: DC
  Administered 2017-08-15 – 2017-08-16 (×2): 81 mg via ORAL
  Filled 2017-08-15 (×2): qty 1

## 2017-08-15 MED ORDER — PRAVASTATIN SODIUM 20 MG PO TABS
20.0000 mg | ORAL_TABLET | Freq: Every day | ORAL | Status: DC
  Administered 2017-08-15: 20 mg via ORAL
  Filled 2017-08-15 (×2): qty 1

## 2017-08-15 MED ORDER — PNEUMOCOCCAL VAC POLYVALENT 25 MCG/0.5ML IJ INJ
0.5000 mL | INJECTION | INTRAMUSCULAR | Status: AC
  Administered 2017-08-16: 0.5 mL via INTRAMUSCULAR
  Filled 2017-08-15: qty 0.5

## 2017-08-15 MED ORDER — ONDANSETRON HCL 4 MG/2ML IJ SOLN: 4.0000 mg | Freq: Four times a day (QID) | INTRAMUSCULAR | Status: DC | PRN

## 2017-08-15 MED ORDER — ONDANSETRON HCL 4 MG PO TABS: 4.0000 mg | ORAL_TABLET | Freq: Four times a day (QID) | ORAL | Status: DC | PRN

## 2017-08-15 MED ORDER — ACETAMINOPHEN 650 MG RE SUPP: 650.0000 mg | Freq: Four times a day (QID) | RECTAL | Status: DC | PRN

## 2017-08-15 MED ORDER — HYDRALAZINE HCL 20 MG/ML IJ SOLN: 5.0000 mg | INTRAMUSCULAR | Status: DC | PRN

## 2017-08-15 MED ORDER — ACETAMINOPHEN 325 MG PO TABS
650.0000 mg | ORAL_TABLET | Freq: Four times a day (QID) | ORAL | Status: DC | PRN
  Filled 2017-08-15: qty 2

## 2017-08-15 MED ORDER — ENOXAPARIN SODIUM 40 MG/0.4ML ~~LOC~~ SOLN
40.0000 mg | Freq: Every day | SUBCUTANEOUS | Status: DC
  Filled 2017-08-15: qty 0.4

## 2017-08-15 MED ORDER — SODIUM CHLORIDE 0.9 % IV SOLN
INTRAVENOUS | Status: DC
  Administered 2017-08-15: 04:00:00 via INTRAVENOUS

## 2017-08-15 NOTE — Evaluation (Signed)
Physical Therapy Evaluation Patient Details Name: Natalie Bullock MRN: [id number] DOB: 06-20-1924 Today's Date: 08/15/2017   History of Present Illness  Patient presnts to the ED follwing a bout of syncope. She reports she has had this in the past. She reports dfeeling better today. PMHL: CAD, MI, CABG, IHD, breast cancer, OA   Clinical Impression  Patient appears to be at her baseline mobility. She had no dizziness with sitting or standing. She was able to ambulate in her room. Her blood pressure was mildly hight but increased with change of position. Supine 166/65 Seated 185/78 Standing 162/83. She currently has no need for further skilled therapy.     Follow Up Recommendations No PT follow up    Equipment Recommendations  Rolling walker with 5" wheels    Recommendations for Other Services       Precautions / Restrictions Precautions Precautions: Fall Restrictions Weight Bearing Restrictions: No      Mobility  Bed Mobility Overal bed mobility: Needs Assistance Bed Mobility: Supine to Sit;Sit to Supine     Supine to sit: Supervision Sit to supine: Min assist   General bed mobility comments: needed min a to get her legs back into the bed   Transfers Overall transfer level: Needs assistance Equipment used: Rolling walker (2 wheeled) Transfers: Sit to/from Stand Sit to Stand: Supervision         General transfer comment: supervision with walker. she reported feeling like the floor was slippery. She had non skid socks on   Ambulation/Gait Ambulation/Gait assistance: Supervision Ambulation Distance (Feet): 10 Feet Assistive device: Rolling walker (2 wheeled)       General Gait Details: No syncope with gait. Limited by [facility] catheter ( nursing could not remove) . No fatigue with gait   Stairs            Wheelchair Mobility    Modified Rankin (Stroke Patients Only)       Balance                                              Pertinent Vitals/Pain      Home Living Family/patient expects to be discharged to:: Private residence Living Arrangements: Spouse/significant other Available Help at Discharge: Family Type of Home: House       Home Layout: One level        Prior Function Level of Independence: Independent with assistive device(s)         Comments: used rollator walker for primary mobility.      Hand Dominance   Dominant Hand: Right    Extremity/Trunk Assessment   Upper Extremity Assessment Upper Extremity Assessment: Overall WFL for tasks assessed    Lower Extremity Assessment Lower Extremity Assessment: Overall WFL for tasks assessed    Cervical / Trunk Assessment Cervical / Trunk Assessment: Normal  Communication   Communication: No difficulties  Cognition Arousal/Alertness: Awake/alert Behavior During Therapy: WFL for tasks assessed/performed Overall Cognitive Status: Within Functional Limits for tasks assessed                                        General Comments      Exercises     Assessment/Plan    PT Assessment Patent does not need any further PT services  PT Problem  List         PT Treatment Interventions      PT Goals (Current goals can be found in the Care Plan section)  Acute Rehab PT Goals Patient Stated Goal: to go home  PT Goal Formulation: With patient Time For Goal Achievement: 08/22/17 Potential to Achieve Goals: Good    Frequency     Barriers to discharge        Co-evaluation               AM-PAC PT "6 Clicks" Daily Activity  Outcome Measure Difficulty turning over in bed (including adjusting bedclothes, sheets and blankets)?: A Little Difficulty moving from lying on back to sitting on the side of the bed? : A Little Difficulty sitting down on and standing up from a chair with arms (e.g., wheelchair, bedside commode, etc,.)?: A Little Help needed moving to and from a bed to chair (including a  wheelchair)?: None Help needed walking in hospital room?: None Help needed climbing 3-5 steps with a railing? : None 6 Click Score: 21    End of Session Equipment Utilized During Treatment: Gait belt Activity Tolerance: Patient tolerated treatment well Patient left: in bed;with call bell/phone within reach Nurse Communication: Mobility status PT Visit Diagnosis: Unsteadiness on feet (R26.81);Dizziness and giddiness (R42)    Time: 0930-1000 PT Time Calculation (min) (ACUTE ONLY): 30 min   Charges:   PT Evaluation $PT Eval Moderate Complexity: 1 Mod     PT G Codes:          Carney Living PTDPT  08/15/2017, 10:47 AM

## 2017-08-15 NOTE — ED Notes (Signed)
PT here for Pt.

## 2017-08-15 NOTE — ED Notes (Signed)
Pt is alert and oriented, assisted up in bed to eat lunch-- denies any pain, states would lilke to go home.

## 2017-08-15 NOTE — H&P (Signed)
History and Physical    Natalie Bullock UMP:[id number] DOB: [birth year] DOA: 08/14/2017  PCP: Seward Carol, MD  Patient coming from: Home.  Chief Complaint: Dizziness.  HPI: Natalie Bullock is a 84 y.o. female with history of CAD status post CABG, hypertension, hypothyroidism and previous history of recurrent syncope was brought to the ER after patient had a near syncopal/dizzy episode.  Patient states yesterday morning she was trying to walk to the bathroom when she got up from the couch and felt dizzy and she knew that she had similar episode when she lost consciousness and she decided to sit back on the couch and she alerted the emergency.  Patient did not have any chest pain or shortness of breath nausea vomiting or diarrhea.  Did not have any focal deficits.  Did not lose consciousness.  EMS brought patient to the ER.  ED Course: In the ER patient was orthostatic.  Was given 2 L fluid bolus.  And admitted for further observation.  EKG shows normal sinus rhythm with PVCs.  Patient is on diuretics which will be held.  Review of Systems: As per HPI, rest all negative.   Past Medical History:  Diagnosis Date  . Anemia   . Arthritis    osteoarthritis  . Breast cancer ([place]) 1975   Right sided mastectomy  . Coronary artery disease   . H/O hypokalemia   . Hx of CABG    pt states she had 5 bypasses  . Hyperlipidemia   . Hypertension   . Hypothyroidism   . IHD (ischemic heart disease)    with CABG in August 2002  . Myocardial infarction (HCC)    Non Q-wave MI  . Vitamin D deficiency     Past Surgical History:  Procedure Laterality Date  . BREAST SURGERY     left breast cyst surgery   . CARDIAC CATHETERIZATION  01/20/2001   NORMAL. EF 60-70%  . CARDIOVASCULAR STRESS TEST  Aug 2010   Normal; EF 74%  . CORONARY ARTERY BYPASS GRAFT  12/2000   LEFT INTERNAL MAMMARY TO THE LAD, SAPHENOUS VEIN GRAFT TO THE FIRST DIAGONAL, SAPHENOUS VEIN GRAFT TO THE RAMUS INTERMEDIATE, AND A SEQUENTIAL  SAPHENOUS VEIN GRAFT TO THE POSTERIOR DESCENDING AND POSTERIOR LATERAL BRANCHES.  Marland Kitchen MASS EXCISION  04/08/2011   Procedure: EXCISION MASS;  Surgeon: Wynonia Sours, MD;  Location: [facility];  Service: Orthopedics;  Laterality: Left;  excision mass left palm  . MASTECTOMY  1975   RIGHT SIDE  . TUBAL LIGATION     1960's     reports that she quit smoking about 48 years ago. Her smoking use included cigarettes. She has a 4.00 pack-year smoking history. she has never used smokeless tobacco. She reports that she does not drink alcohol or use drugs.  Allergies  Allergen Reactions  . Valium Shortness Of Breath  . Codeine Other (See Comments)    unknown  . Crestor [Rosuvastatin Calcium] Other (See Comments)    unknown  . Doxycycline Other (See Comments)    uknown  . Ezetimibe-Simvastatin Other (See Comments)    unknown  . Lipitor [Atorvastatin Calcium] Other (See Comments)    unknown  . Sulfa Drugs Cross Reactors Other (See Comments)    unknown  . Tequin Other (See Comments)    unknown  . Zocor [Simvastatin] Other (See Comments)    unknown    Family History  Problem Relation Age of Onset  . Alzheimer's disease Mother  Prior to Admission medications   Medication Sig Start Date End Date Taking? Authorizing Provider  Ascorbic Acid (VITAMIN C IMMUNE HEALTH PO) Take 1 tablet by mouth daily.   Yes [provider]  aspirin 81 MG tablet Take 81 mg by mouth daily.    Yes [provider]  levothyroxine (SYNTHROID, LEVOTHROID) 50 MCG tablet Take 50 mcg by mouth daily before breakfast.    Yes [provider]  Polyethyl Glycol-Propyl Glycol (SYSTANE OP) Place 2 drops into both eyes every morning. For eye dryness.   Yes [provider]  pravastatin (PRAVACHOL) 20 MG tablet TAKE 1 TABLET (20 MG TOTAL) BY MOUTH AT BEDTIME. 10/28/16  Yes Crenshaw, Denice Bors, MD  triamterene-hydrochlorothiazide (MAXZIDE-25) 37.5-25 MG tablet Take 1 tablet by mouth  daily. 08/02/16  Yes [provider]    Physical Exam: Vitals:   08/14/17 1715 08/14/17 1930 08/14/17 2045 08/14/17 2207  BP: (!) 175/63 (!) 188/68 (!) 157/65   Pulse: 66 64 (!) 56   Resp: 19 15 17    SpO2: 100% 100% 95%   Weight:    63.5 kg (140 lb)  Height:    5\' 3"  (1.6 m)      Constitutional: Moderately built and nourished. Vitals:   08/14/17 1715 08/14/17 1930 08/14/17 2045 08/14/17 2207  BP: (!) 175/63 (!) 188/68 (!) 157/65   Pulse: 66 64 (!) 56   Resp: 19 15 17    SpO2: 100% 100% 95%   Weight:    63.5 kg (140 lb)  Height:    5\' 3"  (1.6 m)   Eyes: Anicteric no pallor. ENMT: No discharge from the ears eyes nose or mouth. Neck: No mass felt.  No JVD appreciated. Respiratory: No rhonchi or crepitations. Cardiovascular: S1-S2 heard no murmurs appreciated. Abdomen: Soft nontender bowel sounds present. Musculoskeletal: No edema.  No joint effusion.  Has painful joints from arthritis. Skin: No rash. Neurologic: Alert awake oriented to time place and person.  Moves all extremities. Psychiatric: Appears normal.  Normal affect.   Labs on Admission: I have personally reviewed following labs and imaging studies  CBC: Recent Labs  Lab 08/14/17 1702 08/14/17 1731  WBC 5.3  --   NEUTROABS 4.3  --   HGB 11.2* 12.2  HCT 35.2* 36.0  MCV 89.1  --   PLT 178  --    Basic Metabolic Panel: Recent Labs  Lab 08/14/17 1702 08/14/17 1731  NA 140 141  K 3.6 3.6  CL 105 105  CO2 23  --   GLUCOSE 93 89  BUN 13 14  CREATININE 0.72 0.60  CALCIUM 9.8  --    GFR: Estimated Creatinine Clearance: 41.9 mL/min (by C-G formula based on SCr of 0.6 mg/dL). Liver Function Tests: Recent Labs  Lab 08/14/17 1702  AST 18  ALT 11*  ALKPHOS 35*  BILITOT 0.7  PROT 7.2  ALBUMIN 4.1   No results for input(s): LIPASE, AMYLASE in the last 168 hours. No results for input(s): AMMONIA in the last 168 hours. Coagulation Profile: No results for input(s): INR, PROTIME in the last  168 hours. Cardiac Enzymes: No results for input(s): CKTOTAL, CKMB, CKMBINDEX, TROPONINI in the last 168 hours. BNP (last 3 results) No results for input(s): PROBNP in the last 8760 hours. HbA1C: No results for input(s): HGBA1C in the last 72 hours. CBG: No results for input(s): GLUCAP in the last 168 hours. Lipid Profile: No results for input(s): CHOL, HDL, LDLCALC, TRIG, CHOLHDL, LDLDIRECT in the last 72 hours. Thyroid  Function Tests: No results for input(s): TSH, T4TOTAL, FREET4, T3FREE, THYROIDAB in the last 72 hours. Anemia Panel: No results for input(s): VITAMINB12, FOLATE, FERRITIN, TIBC, IRON, RETICCTPCT in the last 72 hours. Urine analysis:    Component Value Date/Time   COLORURINE YELLOW 10/20/2015 0800   APPEARANCEUR CLEAR 10/20/2015 0800   LABSPEC 1.018 10/20/2015 0800   PHURINE 7.5 10/20/2015 0800   GLUCOSEU NEGATIVE 10/20/2015 0800   HGBUR NEGATIVE 10/20/2015 0800   BILIRUBINUR NEGATIVE 10/20/2015 0800   KETONESUR NEGATIVE 10/20/2015 0800   PROTEINUR NEGATIVE 10/20/2015 0800   UROBILINOGEN 1.0 03/26/2013 0624   NITRITE NEGATIVE 10/20/2015 0800   LEUKOCYTESUR NEGATIVE 10/20/2015 0800   Sepsis Labs: @LABRCNTIP (procalcitonin:4,lacticidven:4) )No results found for this or any previous visit (from the past 240 hour(s)).   Radiological Exams on Admission: No results found.  EKG: Independently reviewed.  Normal sinus rhythm with PVCs.  Nonspecific ST-T changes.  Assessment/Plan Principal Problem:   Near syncope Active Problems:   CAD (coronary artery disease)   HX: breast cancer   Hypothyroidism   HTN (hypertension)    1. Near syncope -has had previously multiple episodes of syncope last one was 3 years ago.  Patient was clearly orthostatic in the ER and was given 2 L normal saline bolus.  We will continue with gentle hydration.  Recheck orthostatics in a.m. obtain physical therapy and will also check 2D echo.  Hold diuretics.  Check cortisol  level. 2. Hypertension uncontrolled -since patient is orthostatic holding diuretics and I have placed patient on PRN IV hydralazine.  Closely follow blood pressure trends. 3. Hypothyroidism on Synthroid. 4. CAD status post CABG on aspirin and statins.  Not on beta-blockers due to patient having low normal heart rate. 5. Normocytic normochromic anemia -follow CBC further workup as outpatient.   DVT prophylaxis: Lovenox. Code Status: Full code. Family Communication: Discussed with patient. Disposition Plan: To be determined. Consults called: Physical therapy. Admission status: Observation.   Rise Patience MD [facility] Pager [phone].  If 7PM-7AM, please contact night-coverage [email or web address] Password [email or web address]  08/15/2017, 2:00 AM

## 2017-08-15 NOTE — ED Notes (Signed)
Pt. pn BSC for BM

## 2017-08-15 NOTE — ED Notes (Signed)
Report called to [facility].

## 2017-08-15 NOTE — Progress Notes (Signed)
Seen and evaluated on rounds.  Clinical diabetes including admitting H&P and labsas well as medication list reviewed and noted.   Natalie Bullock is a 84 y.o. female with  medical history significant for but not limited to CAD status post CABG, and hypertension hypertension presenting with 1 day history of dizziness while trying to walk to the bathroom from the couch, without loss of consciousness.  In the ED patient was orthostatic without acute ST-T wave changes EKG.  She was given 2 L of IV fluid bolus, admitted to the hospitalist service for further evaluation and management.  This morning patient denies any dizziness, no chest pain, no shortness of breath, no fever or chills.  Her appetite is intact and would like to go home.  Agree with holding diuretics; continue plan of care for possible discharge in the morning-

## 2017-08-15 NOTE — ED Notes (Signed)
Pt. Eating breakfast,  Stated, I feel pretty good

## 2017-08-15 NOTE — ED Notes (Signed)
[facility] [place]

## 2017-08-16 ENCOUNTER — Other Ambulatory Visit (HOSPITAL_COMMUNITY): Payer: Medicare Other

## 2017-08-16 ENCOUNTER — Observation Stay (HOSPITAL_BASED_OUTPATIENT_CLINIC_OR_DEPARTMENT_OTHER): Payer: Medicare Other

## 2017-08-16 DIAGNOSIS — I361 Nonrheumatic tricuspid (valve) insufficiency: Secondary | ICD-10-CM

## 2017-08-16 DIAGNOSIS — R55 Syncope and collapse: Secondary | ICD-10-CM | POA: Diagnosis not present

## 2017-08-16 DIAGNOSIS — I951 Orthostatic hypotension: Secondary | ICD-10-CM | POA: Diagnosis not present

## 2017-08-16 LAB — ECHOCARDIOGRAM COMPLETE
Height: 64 in
Weight: 2048 oz

## 2017-08-16 MED ORDER — AMLODIPINE BESYLATE 5 MG PO TABS
5.0000 mg | ORAL_TABLET | Freq: Every day | ORAL | Status: DC
  Administered 2017-08-16: 5 mg via ORAL
  Filled 2017-08-16: qty 1

## 2017-08-16 MED ORDER — AMLODIPINE BESYLATE 5 MG PO TABS: 5.0000 mg | ORAL_TABLET | Freq: Every day | ORAL | 0 refills | Status: DC

## 2017-08-16 NOTE — Discharge Summary (Signed)
Natalie Bullock, is a 84 y.o. female  DOB Jul 11, 1924  MRN [id number]  Admission date:  08/14/2017  Admitting Physician  Rise Patience, MD  Discharge Date:  08/16/2017   Primary MD  Seward Carol, MD  Recommendations for primary care physician for things to follow:  2D echocardiogram results CBC BMP  Admission Diagnosis  Orthostasis [I95.1] Near syncope [R55]   Discharge Diagnosis  Orthostasis [I95.1] Near syncope [R55]  Principal Problem:   Near syncope Active Problems:   CAD (coronary artery disease)   HX: breast cancer   Hypothyroidism   HTN (hypertension)      Past Medical History:  Diagnosis Date  . Anemia   . Arthritis    osteoarthritis  . Breast cancer ([place]) 1975   Right sided mastectomy  . Coronary artery disease   . H/O hypokalemia   . Hx of CABG    pt states she had 5 bypasses  . Hyperlipidemia   . Hypertension   . Hypothyroidism   . IHD (ischemic heart disease)    with CABG in August 2002  . Myocardial infarction (HCC)    Non Q-wave MI  . Vitamin D deficiency     Past Surgical History:  Procedure Laterality Date  . BREAST SURGERY     left breast cyst surgery   . CARDIAC CATHETERIZATION  01/20/2001   NORMAL. EF 60-70%  . CARDIOVASCULAR STRESS TEST  Aug 2010   Normal; EF 74%  . CORONARY ARTERY BYPASS GRAFT  12/2000   LEFT INTERNAL MAMMARY TO THE LAD, SAPHENOUS VEIN GRAFT TO THE FIRST DIAGONAL, SAPHENOUS VEIN GRAFT TO THE RAMUS INTERMEDIATE, AND A SEQUENTIAL SAPHENOUS VEIN GRAFT TO THE POSTERIOR DESCENDING AND POSTERIOR LATERAL BRANCHES.  Marland Kitchen MASS EXCISION  04/08/2011   Procedure: EXCISION MASS;  Surgeon: Wynonia Sours, MD;  Location: [facility];  Service: Orthopedics;  Laterality: Left;  excision mass left palm  . MASTECTOMY  1975   RIGHT SIDE  . TUBAL LIGATION     1960's       HPI  from the history and physical done on the day of admission:    Natalie Bullock is a 84 y.o. female with history of CAD status post CABG, hypertension, hypothyroidism and previous history of recurrent syncope was brought to the ER after patient had a near syncopal/dizzy episode.  Patient states yesterday morning she was trying to walk to the bathroom when she got up from the couch and felt dizzy and she knew that she had similar episode when she lost consciousness and she decided to sit back on the couch and she alerted the emergency.  Patient did not have any chest pain or shortness of breath nausea vomiting or diarrhea.  Did not have any focal deficits.  Did not lose consciousness.  EMS brought patient to the ER.  ED Course: In the ER patient was orthostatic.  Was given 2 L fluid bolus.  And admitted for further observation.  EKG shows normal sinus rhythm with PVCs.  Patient is on diuretics which will be held.   Hospital Course:  Natalie Bullock a 84 y.o.femalewith medical history significant for but not limited to CAD status post CABG, and hypertension hypertension presenting with 1 day history of dizziness while trying to walk to the bathroom from the couch, without loss of consciousness.  In the ED patient was orthostatic without acute ST-T wave changes EKG.  She was given 2 L of IV fluid bolus, admitted to the hospitalist service for further evaluation and management.  Patient was gently hydrated with telemetry monitoring without any significant dysrhythmias.  Serial cardiac enzymes was negative for any acute coronary syndrome.  Her diuretic was held and given as needed hydralazine for blood pressure control.  Patient has been feeling fine baseline.  She was evaluated by PT with any dizziness.  She really wants to go home and was able to convince her to stay until she had an echocardiogram completed earlier this afternoon.    We have replaced her diuretic with amlodipine and she will need reevaluation in the outpatient setting by PCP in this regard. Echo  results are still pending and needs to be checked with consideration for possible cardiac evaluation further as needed.    The cause of her near syncopal episode is felt to be due to orthostatic hypotension potentially associated with diuretic, and is counseled regarding appropriate daily hydration    Discharge Condition: Stable satisfactory  Follow UP -PCP next week  Follow-up Information    Seward Carol, MD. Go in 1 week(s).   Specialty:  Internal Medicine Why:  Call for appointment to be seen next week Contact information: [address] [place] [address] [phone]            Consults obtained -not applicable  Diet and Activity recommendation:  As advised  Discharge Instructions     Discharge Instructions    Call MD for:   Complete by:  As directed    Call MD for:  difficulty breathing, headache or visual disturbances   Complete by:  As directed    Call MD for:  extreme fatigue   Complete by:  As directed    Call MD for:  hives   Complete by:  As directed    Call MD for:  persistant dizziness or light-headedness   Complete by:  As directed    Call MD for:  persistant nausea and vomiting   Complete by:  As directed    Call MD for:  redness, tenderness, or signs of infection (pain, swelling, redness, odor or green/yellow discharge around incision site)   Complete by:  As directed    Call MD for:  severe uncontrolled pain   Complete by:  As directed    Call MD for:  temperature >100.4   Complete by:  As directed    Diet - low sodium heart healthy   Complete by:  As directed    Increase activity slowly   Complete by:  As directed         Discharge Medications     Allergies as of 08/16/2017      Reactions   Valium Shortness Of Breath   Codeine Other (See Comments)   unknown   Crestor [rosuvastatin Calcium] Other (See Comments)   unknown   Doxycycline Other (See Comments)   uknown   Ezetimibe-simvastatin Other (See Comments)    unknown   Lipitor [atorvastatin Calcium] Other (See Comments)   unknown   Sulfa Drugs  Cross Reactors Other (See Comments)   unknown   Tequin Other (See Comments)   unknown   Zocor [simvastatin] Other (See Comments)   unknown      Medication List    STOP taking these medications   triamterene-hydrochlorothiazide 37.5-25 MG tablet Commonly known as:  MAXZIDE-25     TAKE these medications   amLODipine 5 MG tablet Commonly known as:  NORVASC Take 1 tablet (5 mg total) by mouth daily.   aspirin 81 MG tablet Take 81 mg by mouth daily.   levothyroxine 50 MCG tablet Commonly known as:  SYNTHROID, LEVOTHROID Take 50 mcg by mouth daily before breakfast.   pravastatin 20 MG tablet Commonly known as:  PRAVACHOL TAKE 1 TABLET (20 MG TOTAL) BY MOUTH AT BEDTIME.   SYSTANE OP Place 2 drops into both eyes every morning. For eye dryness.   VITAMIN C IMMUNE HEALTH PO Take 1 tablet by mouth daily.       Major procedures and Radiology Reports - PLEASE review detailed and final reports for all details, in brief -     No results found.  Micro Results    No results found for this or any previous visit (from the past 240 hour(s)).     Today   Subjective    Starleen Blue today has no dizziness, no weakness, no chest pain, no shortness of breath, no diaphoresis, she is comfortable at baseline and wants to go home today.         Patient has been seen and examined prior to discharge   Objective   Blood pressure (!) 170/63, pulse (!) 56, temperature 98.6 F (37 C), temperature source Oral, resp. rate 20, height 5\' 4"  (1.626 m), weight 58.1 kg (128 lb), SpO2 100 %.   Intake/Output Summary (Last 24 hours) at 08/16/2017 1440 Last data filed at 08/16/2017 1118 Gross per 24 hour  Intake 480 ml  Output 1350 ml  Net -870 ml    Exam Gen:- Awake   HEENT:- [place].AT,  MMM Neck-Supple Neck,No JVD,  Lungs- mostly clear  CV- S1, S2 normal Abd-  +ve B.Sounds, Abd Soft, No tenderness,     Extremity/Skin:- Intact peripheral pulses   CNS: Alert and oriented x3   Data Review   CBC w Diff:  Lab Results  Component Value Date   WBC 5.6 08/15/2017   HGB 11.8 (L) 08/15/2017   HCT 36.0 08/15/2017   PLT 177 08/15/2017   LYMPHOPCT 16 08/14/2017   MONOPCT 4 08/14/2017   EOSPCT 0 08/14/2017   BASOPCT 0 08/14/2017    CMP:  Lab Results  Component Value Date   NA 142 08/15/2017   K 3.7 08/15/2017   CL 107 08/15/2017   CO2 26 08/15/2017   BUN 10 08/15/2017   CREATININE 0.64 08/15/2017   PROT 7.2 08/14/2017   ALBUMIN 4.1 08/14/2017   BILITOT 0.7 08/14/2017   ALKPHOS 35 (L) 08/14/2017   AST 18 08/14/2017   ALT 11 (L) 08/14/2017  .   Total Discharge time is about 33 minutes  OSEI-BONSU,Eula Jaster M.D on 08/16/2017 at 2:40 PM  [facility]   Office  [phone]  Dragon dictation system was used to create this note, attempts have been made to correct errors, however presence of uncorrected errors is not a reflection quality of care provided

## 2017-08-16 NOTE — Progress Notes (Signed)
  Echocardiogram 2D Echocardiogram has been performed.  Natalie Bullock [name] 08/16/2017, 12:04 PM

## 2017-08-16 NOTE — Progress Notes (Signed)
Patient rested well last night.

## 2017-09-29 ENCOUNTER — Emergency Department (HOSPITAL_COMMUNITY)
Admission: EM | Admit: 2017-09-29 | Discharge: 2017-09-29 | Disposition: A | Discharge status: Home / Self Care | Payer: Medicare Other | Attending: Emergency Medicine | Admitting: Emergency Medicine

## 2017-09-29 ENCOUNTER — Encounter (HOSPITAL_COMMUNITY): Payer: Self-pay | Admitting: Pharmacy Technician

## 2017-09-29 DIAGNOSIS — Z79899 Other long term (current) drug therapy: Secondary | ICD-10-CM | POA: Diagnosis not present

## 2017-09-29 DIAGNOSIS — R404 Transient alteration of awareness: Secondary | ICD-10-CM | POA: Diagnosis not present

## 2017-09-29 DIAGNOSIS — I2510 Atherosclerotic heart disease of native coronary artery without angina pectoris: Secondary | ICD-10-CM | POA: Insufficient documentation

## 2017-09-29 DIAGNOSIS — R55 Syncope and collapse: Secondary | ICD-10-CM

## 2017-09-29 DIAGNOSIS — Z87891 Personal history of nicotine dependence: Secondary | ICD-10-CM | POA: Diagnosis not present

## 2017-09-29 DIAGNOSIS — E039 Hypothyroidism, unspecified: Secondary | ICD-10-CM | POA: Diagnosis not present

## 2017-09-29 DIAGNOSIS — I10 Essential (primary) hypertension: Secondary | ICD-10-CM | POA: Insufficient documentation

## 2017-09-29 DIAGNOSIS — R42 Dizziness and giddiness: Secondary | ICD-10-CM | POA: Diagnosis not present

## 2017-09-29 LAB — CBC WITH DIFFERENTIAL/PLATELET
Basophils Absolute: 0 10*3/uL (ref 0.0–0.1)
Basophils Relative: 1 %
Eosinophils Absolute: 0.1 10*3/uL (ref 0.0–0.7)
Eosinophils Relative: 2 %
HEMATOCRIT: 33.8 % — AB (ref 36.0–46.0)
Hemoglobin: 10.6 g/dL — ABNORMAL LOW (ref 12.0–15.0)
LYMPHS PCT: 15 %
Lymphs Abs: 0.6 10*3/uL — ABNORMAL LOW (ref 0.7–4.0)
MCH: 27.8 pg (ref 26.0–34.0)
MCHC: 31.4 g/dL (ref 30.0–36.0)
MCV: 88.7 fL (ref 78.0–100.0)
MONO ABS: 0.5 10*3/uL (ref 0.1–1.0)
MONOS PCT: 11 %
NEUTROS ABS: 3.1 10*3/uL (ref 1.7–7.7)
Neutrophils Relative %: 71 %
Platelets: 161 10*3/uL (ref 150–400)
RBC: 3.81 MIL/uL — ABNORMAL LOW (ref 3.87–5.11)
RDW: 14.3 % (ref 11.5–15.5)
WBC: 4.3 10*3/uL (ref 4.0–10.5)

## 2017-09-29 LAB — BASIC METABOLIC PANEL
Anion gap: 5 (ref 5–15)
BUN: 12 mg/dL (ref 6–20)
CALCIUM: 9.5 mg/dL (ref 8.9–10.3)
CO2: 28 mmol/L (ref 22–32)
CREATININE: 0.62 mg/dL (ref 0.44–1.00)
Chloride: 106 mmol/L (ref 101–111)
GFR calc Af Amer: >60 mL/min (ref >60)
GFR calc non Af Amer: >60 mL/min (ref >60)
GLUCOSE: 74 mg/dL (ref 65–99)
Potassium: 3.7 mmol/L (ref 3.5–5.1)
Sodium: 139 mmol/L (ref 135–145)

## 2017-09-29 MED ORDER — SODIUM CHLORIDE 0.9 % IV BOLUS
1000.0000 mL | Freq: Once | INTRAVENOUS | Status: AC
  Administered 2017-09-29: 1000 mL via INTRAVENOUS

## 2017-09-29 NOTE — ED Provider Notes (Signed)
[facility] EMERGENCY DEPARTMENT Provider Note   CSN: [id number] Arrival date & time: 09/29/17  1436     History   Chief Complaint Chief Complaint  Patient presents with  . Near Syncope    HPI Natalie Bullock is a 84 y.o. female.  84 yo F with a chief complaint of a near syncopal event.  Patient states she stood up from the couch and felt like she might pass out and sat back down.  She then alerted the emergency medical services who contacted her caregiver who came over.  At that time she was able to stand up and walk without issue but he decided she is come to the emergency department for evaluation.  She states that she has had these for 6 months or more where she stands up and feels a bit lightheaded.  Is always been attributed to her not having enough fluid intake.  She feels that she has been drinking well.  She was recently in the hospital and was taken off of her diuretic.  She denies resuming her medication.  Denies any other changes in her medications.  Denies chest pain shortness of breath headache diarrhea or vomiting.  Feels that she has been eating and drinking well.  The history is provided by the patient.  Near Syncope  This is a new problem. The current episode started less than 1 hour ago. The problem occurs rarely. The problem has been resolved. Pertinent negatives include no chest pain, no abdominal pain, no headaches and no shortness of breath. The symptoms are aggravated by standing. Nothing relieves the symptoms. She has tried nothing for the symptoms. The treatment provided no relief.    Past Medical History:  Diagnosis Date  . Anemia   . Arthritis    osteoarthritis  . Breast cancer ([place]) 1975   Right sided mastectomy  . Coronary artery disease   . H/O hypokalemia   . Hx of CABG    pt states she had 5 bypasses  . Hyperlipidemia   . Hypertension   . Hypothyroidism   . IHD (ischemic heart disease)    with CABG in August 2002  . Myocardial  infarction (HCC)    Non Q-wave MI  . Vitamin D deficiency     Patient Active Problem List   Diagnosis Date Noted  . Orthostasis   . Mitral regurgitation 03/07/2014  . Near syncope 03/26/2013  . Syncope 05/19/2012  . Delirium 09/12/2011  . HTN (hypertension) 08/17/2011  . HX: breast cancer 08/14/2011  . Hypothyroidism 08/14/2011  . CAD (coronary artery disease) 11/19/2010  . Hyperlipidemia 11/19/2010    Past Surgical History:  Procedure Laterality Date  . BREAST SURGERY     left breast cyst surgery   . CARDIAC CATHETERIZATION  01/20/2001   NORMAL. EF 60-70%  . CARDIOVASCULAR STRESS TEST  Aug 2010   Normal; EF 74%  . CORONARY ARTERY BYPASS GRAFT  12/2000   LEFT INTERNAL MAMMARY TO THE LAD, SAPHENOUS VEIN GRAFT TO THE FIRST DIAGONAL, SAPHENOUS VEIN GRAFT TO THE RAMUS INTERMEDIATE, AND A SEQUENTIAL SAPHENOUS VEIN GRAFT TO THE POSTERIOR DESCENDING AND POSTERIOR LATERAL BRANCHES.  Marland Kitchen MASS EXCISION  04/08/2011   Procedure: EXCISION MASS;  Surgeon: Wynonia Sours, MD;  Location: [facility];  Service: Orthopedics;  Laterality: Left;  excision mass left palm  . MASTECTOMY  1975   RIGHT SIDE  . TUBAL LIGATION     1960's     OB History  None      Home Medications    Prior to Admission medications   Medication Sig Start Date End Date Taking? Authorizing Provider  amLODipine (NORVASC) 5 MG tablet Take 1 tablet (5 mg total) by mouth daily. 08/16/17 09/15/17  Benito Mccreedy, MD  Ascorbic Acid (VITAMIN C IMMUNE HEALTH PO) Take 1 tablet by mouth daily.    [provider]  aspirin 81 MG tablet Take 81 mg by mouth daily.     [provider]  levothyroxine (SYNTHROID, LEVOTHROID) 50 MCG tablet Take 50 mcg by mouth daily before breakfast.     [provider]  Polyethyl Glycol-Propyl Glycol (SYSTANE OP) Place 2 drops into both eyes every morning. For eye dryness.    [provider]  pravastatin (PRAVACHOL) 20 MG tablet TAKE 1 TABLET (20 MG  TOTAL) BY MOUTH AT BEDTIME. 10/28/16   Crenshaw, Denice Bors, MD    Family History Family History  Problem Relation Age of Onset  . Alzheimer's disease Mother     Social History Social History   Tobacco Use  . Smoking status: Former Smoker    Packs/day: 1.00    Years: 4.00    Pack years: 4.00    Types: Cigarettes    Last attempt to quit: 06/02/1969    Years since quitting: 48.3  . Smokeless tobacco: Never Used  Substance Use Topics  . Alcohol use: No  . Drug use: No     Allergies   Valium; Codeine; Crestor [rosuvastatin calcium]; Doxycycline; Ezetimibe-simvastatin; Lipitor [atorvastatin calcium]; Sulfa drugs cross reactors; Tequin; and Zocor [simvastatin]   Review of Systems Review of Systems  Constitutional: Negative for chills and fever.  HENT: Negative for congestion and rhinorrhea.   Eyes: Negative for redness and visual disturbance.  Respiratory: Negative for shortness of breath and wheezing.   Cardiovascular: Positive for near-syncope. Negative for chest pain and palpitations.  Gastrointestinal: Negative for abdominal pain, nausea and vomiting.  Genitourinary: Negative for dysuria and urgency.  Musculoskeletal: Negative for arthralgias and myalgias.  Skin: Negative for pallor and wound.  Neurological: Negative for dizziness and headaches.     Physical Exam Updated Vital Signs BP (!) 165/67   Pulse (!) 55   Resp 18   SpO2 100%   Physical Exam  Constitutional: She is oriented to person, place, and time. She appears well-developed and well-nourished. No distress.  HENT:  Head: Normocephalic and atraumatic.  Eyes: Pupils are equal, round, and reactive to light. EOM are normal.  Neck: Normal range of motion. Neck supple.  Cardiovascular: Normal rate and regular rhythm. Exam reveals no gallop and no friction rub.  No murmur heard. Old sternotomy scar  Pulmonary/Chest: Effort normal. She has no wheezes. She has no rales.  Abdominal: Soft. She exhibits no  distension. There is no tenderness.  Musculoskeletal: She exhibits no edema or tenderness.  Neurological: She is alert and oriented to person, place, and time.  Skin: Skin is warm and dry. She is not diaphoretic.  Psychiatric: She has a normal mood and affect. Her behavior is normal.  Nursing note and vitals reviewed.    ED Treatments / Results  Labs (all labs ordered are listed, but only abnormal results are displayed) Labs Reviewed  CBC WITH DIFFERENTIAL/PLATELET - Abnormal; Notable for the following components:      Result Value   RBC 3.81 (*)    Hemoglobin 10.6 (*)    HCT 33.8 (*)    Lymphs Abs 0.6 (*)    All other components  within normal limits  BASIC METABOLIC PANEL    EKG None  Radiology No results found.  Procedures Procedures (including critical care time)  Medications Ordered in ED Medications  sodium chloride 0.9 % bolus 1,000 mL (1,000 mLs Intravenous New Bag/Given 09/29/17 1505)     Initial Impression / Assessment and Plan / ED Course  I have reviewed the triage vital signs and the nursing notes.  Pertinent labs & imaging results that were available during my care of the patient were reviewed by me and considered in my medical decision making (see chart for details).     84 yo F with a chief complaint of a near syncopal event.  The patient has had this is a recurrent issue for the past 6 months or more.  I suspect this is most likely recurrence of the same.  She is mildly hypertensive.  She has not yet had her blood pressure medication today.  Will obtain labs EKG.  Unremarkable.  Ambulates at baseline. D/c home.   3:54 PM:  I have discussed the diagnosis/risks/treatment options with the patient and family and believe the pt to be eligible for discharge home to follow-up with PCP. We also discussed returning to the ED immediately if new or worsening sx occur. We discussed the sx which are most concerning (e.g., sudden worsening pain, fever, inability to  tolerate by mouth) that necessitate immediate return. Medications administered to the patient during their visit and any new prescriptions provided to the patient are listed below.  Medications given during this visit Medications  sodium chloride 0.9 % bolus 1,000 mL (1,000 mLs Intravenous New Bag/Given 09/29/17 1505)    Labs reviewed cbc with mild anemia  Old records reviewed recent hospitalization with near syncope, echo serial trops without concerning finding, thought to be most likely volume depletion for diuretic.  The patient appears reasonably screen and/or stabilized for discharge and I doubt any other medical condition or other [facility] requiring further screening, evaluation, or treatment in the ED at this time prior to discharge.    Final Clinical Impressions(s) / ED Diagnoses   Final diagnoses:  Near syncope    ED Discharge Orders    None       Deno Etienne, [place] 09/29/17 1554

## 2017-09-29 NOTE — Discharge Instructions (Signed)
Eat and drink well for the next couple of days.  Follow up with your PCP.

## 2017-09-29 NOTE — ED Triage Notes (Signed)
Pt arrives from home via EMS with reports of near syncope and shaking. Seen 2 weeks ago for the same and pt dx with dehydration. Hypertensive with hx of the same. 206/88, HR 64, RR20, 99% RA, CBG 129. Pt in NAD upon arrival. 20g LAC.

## 2017-10-08 ENCOUNTER — Encounter (HOSPITAL_COMMUNITY): Payer: Self-pay | Admitting: Emergency Medicine

## 2017-10-08 ENCOUNTER — Telehealth: Payer: Self-pay | Admitting: Cardiology

## 2017-10-08 ENCOUNTER — Emergency Department (HOSPITAL_COMMUNITY)
Admission: EM | Admit: 2017-10-08 | Discharge: 2017-10-08 | Disposition: A | Discharge status: Home / Self Care | Payer: Medicare Other | Attending: Emergency Medicine | Admitting: Emergency Medicine

## 2017-10-08 DIAGNOSIS — Z79899 Other long term (current) drug therapy: Secondary | ICD-10-CM | POA: Diagnosis not present

## 2017-10-08 DIAGNOSIS — R42 Dizziness and giddiness: Secondary | ICD-10-CM | POA: Diagnosis not present

## 2017-10-08 DIAGNOSIS — E860 Dehydration: Secondary | ICD-10-CM | POA: Diagnosis not present

## 2017-10-08 DIAGNOSIS — I10 Essential (primary) hypertension: Secondary | ICD-10-CM | POA: Diagnosis not present

## 2017-10-08 DIAGNOSIS — R55 Syncope and collapse: Secondary | ICD-10-CM

## 2017-10-08 DIAGNOSIS — Z951 Presence of aortocoronary bypass graft: Secondary | ICD-10-CM | POA: Insufficient documentation

## 2017-10-08 DIAGNOSIS — Z7982 Long term (current) use of aspirin: Secondary | ICD-10-CM | POA: Insufficient documentation

## 2017-10-08 DIAGNOSIS — E039 Hypothyroidism, unspecified: Secondary | ICD-10-CM | POA: Insufficient documentation

## 2017-10-08 DIAGNOSIS — R404 Transient alteration of awareness: Secondary | ICD-10-CM | POA: Diagnosis not present

## 2017-10-08 DIAGNOSIS — Z853 Personal history of malignant neoplasm of breast: Secondary | ICD-10-CM | POA: Diagnosis not present

## 2017-10-08 DIAGNOSIS — Z87891 Personal history of nicotine dependence: Secondary | ICD-10-CM | POA: Insufficient documentation

## 2017-10-08 DIAGNOSIS — I2510 Atherosclerotic heart disease of native coronary artery without angina pectoris: Secondary | ICD-10-CM | POA: Insufficient documentation

## 2017-10-08 LAB — COMPREHENSIVE METABOLIC PANEL
ALT: 10 U/L — ABNORMAL LOW (ref 14–54)
ANION GAP: 9 (ref 5–15)
AST: 17 U/L (ref 15–41)
Albumin: 3.8 g/dL (ref 3.5–5.0)
Alkaline Phosphatase: 40 U/L (ref 38–126)
BUN: 15 mg/dL (ref 6–20)
CHLORIDE: 106 mmol/L (ref 101–111)
CO2: 26 mmol/L (ref 22–32)
Calcium: 9.7 mg/dL (ref 8.9–10.3)
Creatinine, Ser: 0.66 mg/dL (ref 0.44–1.00)
GFR calc Af Amer: >60 mL/min (ref >60)
Glucose, Bld: 88 mg/dL (ref 65–99)
POTASSIUM: 3.7 mmol/L (ref 3.5–5.1)
Sodium: 141 mmol/L (ref 135–145)
TOTAL PROTEIN: 6.7 g/dL (ref 6.5–8.1)
Total Bilirubin: 0.6 mg/dL (ref 0.3–1.2)

## 2017-10-08 LAB — CBC
HEMATOCRIT: 34.1 % — AB (ref 36.0–46.0)
HEMOGLOBIN: 11 g/dL — AB (ref 12.0–15.0)
MCH: 28.6 pg (ref 26.0–34.0)
MCHC: 32.3 g/dL (ref 30.0–36.0)
MCV: 88.8 fL (ref 78.0–100.0)
Platelets: 166 10*3/uL (ref 150–400)
RBC: 3.84 MIL/uL — AB (ref 3.87–5.11)
RDW: 14.4 % (ref 11.5–15.5)
WBC: 3.2 10*3/uL — AB (ref 4.0–10.5)

## 2017-10-08 MED ORDER — SODIUM CHLORIDE 0.9 % IV BOLUS
1000.0000 mL | Freq: Once | INTRAVENOUS | Status: AC
  Administered 2017-10-08: 1000 mL via INTRAVENOUS

## 2017-10-08 NOTE — Telephone Encounter (Signed)
New Message   Pt c/o BP issue:  1. What are your last 5 BP readings? 200/78, 200/80 2. Are you having any other symptoms (ex. Dizziness, headache, blurred vision, passed out)?dizziness  3. What is your medication issue? Amlodipine. Patients godson states that since she has been off the amlodipine she has been having high BP readings. She has had to call EMS at least twice in two weeks.

## 2017-10-08 NOTE — ED Provider Notes (Addendum)
[facility] EMERGENCY DEPARTMENT Provider Note   CSN: [id number] Arrival date & time: 10/08/17  1727   History   Chief Complaint Chief Complaint  Patient presents with  . Dizziness    HPI Natalie Bullock is a 84 y.o. female.with hisotyr of hypertension, ischemic heart disease, CAD with CABG, breast cancer s/p right sided mastectomy, hyperlipidemia, hypertension, h/o hypokalemia, and vitamin deficiency who presented with dizziness/lightheadedness and  shaking while sitting down watching a cowboy film. The patient states that she did not stand up or move because she was scared of falling.   The patient has a life alert button which she pressed and ems arrived. Patient was reported to have a blood pressure of 200/80 recorded by ems. EMS also noted that the patient has not been taking amlodipine 5mg  for the past few months as she was told by Dr. Stanford Breed to stop taking it as she was started on a new diuretic. The patient's godson called Dr. Stanford Breed who mentioned to take the patient to the ED so that the patient's blood pressure safely returns to normal. The patient was given a dose of amlodipine 5mg  prior to leaving house with ems.   The patient was last seen on 09/29/17 with similar complaint that she mentioned at that time has been going on for past 6 months. During that visit the patient was given 1L NS bolus and told to follow up with pcp.   Past Medical History:  Diagnosis Date  . Anemia   . Arthritis    osteoarthritis  . Breast cancer ([place]) 1975   Right sided mastectomy  . Coronary artery disease   . H/O hypokalemia   . Hx of CABG    pt states she had 5 bypasses  . Hyperlipidemia   . Hypertension   . Hypothyroidism   . IHD (ischemic heart disease)    with CABG in August 2002  . Myocardial infarction (HCC)    Non Q-wave MI  . Vitamin D deficiency     Patient Active Problem List   Diagnosis Date Noted  . Orthostasis   . Mitral regurgitation 03/07/2014  .  Near syncope 03/26/2013  . Syncope 05/19/2012  . Delirium 09/12/2011  . HTN (hypertension) 08/17/2011  . HX: breast cancer 08/14/2011  . Hypothyroidism 08/14/2011  . CAD (coronary artery disease) 11/19/2010  . Hyperlipidemia 11/19/2010    Past Surgical History:  Procedure Laterality Date  . BREAST SURGERY     left breast cyst surgery   . CARDIAC CATHETERIZATION  01/20/2001   NORMAL. EF 60-70%  . CARDIOVASCULAR STRESS TEST  Aug 2010   Normal; EF 74%  . CORONARY ARTERY BYPASS GRAFT  12/2000   LEFT INTERNAL MAMMARY TO THE LAD, SAPHENOUS VEIN GRAFT TO THE FIRST DIAGONAL, SAPHENOUS VEIN GRAFT TO THE RAMUS INTERMEDIATE, AND A SEQUENTIAL SAPHENOUS VEIN GRAFT TO THE POSTERIOR DESCENDING AND POSTERIOR LATERAL BRANCHES.  Marland Kitchen MASS EXCISION  04/08/2011   Procedure: EXCISION MASS;  Surgeon: Wynonia Sours, MD;  Location: [facility];  Service: Orthopedics;  Laterality: Left;  excision mass left palm  . MASTECTOMY  1975   RIGHT SIDE  . TUBAL LIGATION     1960's     OB History   None      Home Medications    Prior to Admission medications   Medication Sig Start Date End Date Taking? Authorizing Provider  amLODipine (NORVASC) 5 MG tablet Take 1 tablet (5 mg total) by mouth daily. 08/16/17 09/15/17  Benito Mccreedy, MD  Ascorbic Acid (VITAMIN C IMMUNE HEALTH PO) Take 1 tablet by mouth daily.    [provider]  aspirin 81 MG tablet Take 81 mg by mouth daily.     [provider]  levothyroxine (SYNTHROID, LEVOTHROID) 50 MCG tablet Take 50 mcg by mouth daily before breakfast.     [provider]  Polyethyl Glycol-Propyl Glycol (SYSTANE OP) Place 2 drops into both eyes every morning. For eye dryness.    [provider]  pravastatin (PRAVACHOL) 20 MG tablet TAKE 1 TABLET (20 MG TOTAL) BY MOUTH AT BEDTIME. 10/28/16   Crenshaw, Denice Bors, MD    Family History Family History  Problem Relation Age of Onset  . Alzheimer's disease Mother     Social  History Social History   Tobacco Use  . Smoking status: Former Smoker    Packs/day: 1.00    Years: 4.00    Pack years: 4.00    Types: Cigarettes    Last attempt to quit: 06/02/1969    Years since quitting: 48.3  . Smokeless tobacco: Never Used  Substance Use Topics  . Alcohol use: No  . Drug use: No     Allergies   Valium; Codeine; Crestor [rosuvastatin calcium]; Doxycycline; Ezetimibe-simvastatin; Lipitor [atorvastatin calcium]; Sulfa drugs cross reactors; Tequin; and Zocor [simvastatin]   Review of Systems Review of Systems  Respiratory: Negative for shortness of breath.   Gastrointestinal: Negative for abdominal pain, nausea and vomiting.  Neurological: Positive for dizziness and light-headedness.     Physical Exam Updated Vital Signs BP (!) 148/116   Pulse 62   Temp 99.6 F (37.6 C) (Oral)   Resp 17   Ht 5\' 4"  (1.626 m)   Wt 61.7 kg (136 lb)   SpO2 100%   BMI 23.34 kg/m   Physical Exam  Constitutional: She is oriented to person, place, and time. She appears well-developed and well-nourished. No distress.  HENT:  Head: Normocephalic and atraumatic.  Eyes: Pupils are equal, round, and reactive to light. EOM are normal.  Cardiovascular: Normal rate, regular rhythm and normal heart sounds.  Sternotomy scar visualized  Pulmonary/Chest: Effort normal and breath sounds normal. No stridor. No respiratory distress.  Abdominal: Soft. Bowel sounds are normal. She exhibits no distension. There is no tenderness.  Musculoskeletal: She exhibits no edema.  Neurological: She is alert and oriented to person, place, and time. No cranial nerve deficit.  Skin: She is not diaphoretic. No erythema.  Psychiatric: She has a normal mood and affect. Her behavior is normal. Judgment and thought content normal.    ED Treatments / Results  Labs (all labs ordered are listed, but only abnormal results are displayed) Labs Reviewed  COMPREHENSIVE METABOLIC PANEL - Abnormal; Notable for  the following components:      Result Value   ALT 10 (*)    All other components within normal limits  CBC - Abnormal; Notable for the following components:   WBC 3.2 (*)    RBC 3.84 (*)    Hemoglobin 11.0 (*)    HCT 34.1 (*)    All other components within normal limits    EKG EKG Interpretation  Date/Time:  Thursday Oct 08 2017 17:36:53 EDT Ventricular Rate:  63 PR Interval:    QRS Duration: 93 QT Interval:  435 QTC Calculation: 446 R Axis:   13 Text Interpretation:  Sinus rhythm No significant change since last tracing Confirmed by Deno Etienne [id number]) on 10/08/2017 6:12:30 PM   Radiology No  results found.  Procedures Procedures (including critical care time)  Medications Ordered in ED Medications  sodium chloride 0.9 % bolus 1,000 mL (0 mLs Intravenous Stopped 10/08/17 2137)     Initial Impression / Assessment and Plan / ED Course  I have reviewed the triage vital signs and the nursing notes.  Pertinent labs & imaging results that were available during my care of the patient were reviewed by me and considered in my medical decision making (see chart for details).  84 y.o. female with history of hypertension, CAD with CABG, breast cancer s/p right sided mastectomy and hypertension who presented with dizziness and shaking. The patient is afebrile and does not have leukocytosis. No hypoglycemia. Mild decrease in hb (hb=11, hct=34, mcv=88). EKG showed normal sinus rhythm without any st or t wave changes. No electrolyte abnormalities. Blood pressure elevated on ems arrival at 200/80, but has been normalizing in ED. Patient is not on many centrally acting medications that maybe contributing to dizziness. Patient's dizziness is thought to be due to dehydration and hypertension. Patient was given fluids and told to follow up with pcp and Dr. Stanford Breed. Falls precautions were discussed.   -1L Normal saline given    Final Clinical Impressions(s) / ED Diagnoses   Final diagnoses:    Dizziness  Near syncope  Dehydration    ED Discharge Orders    None       Lars Mage, MD 10/08/17 2352    Lars Mage, MD 10/08/17 2353    Lars Mage, MD 10/08/17 0981    Lars Mage, MD 10/08/17 Kennedy Bucker    Lars Mage, MD 10/08/17 [place], [place], DO 10/08/17 2355

## 2017-10-08 NOTE — Telephone Encounter (Signed)
With verbal permission, spoke with pt's godson who reports Pt has been experiencing high BP with some dizziness. They have had to call EMS already twice this week.   Upon call EMS has already arrived. Per EMS pt BP is 200/78. They administered amlodipine 5 mg but per pt she was told to stop her amlodipine due to being placed on a diuretic. EMS currently transporting Pt to the hospital. Rosalee Kaufman requesting to for appointment for med adjustment. Routing to Dr. Stanford Breed primary nurse to see if he has any availability.

## 2017-10-08 NOTE — ED Triage Notes (Signed)
Per [facility] patient coming from home complaining of a dizzy/lightheaded feeling today while sitting on the couch.Patient had same thing happen last week. Patient hypertensive with ems 200/80. Patient took 5 mg amlodipine before leaving house with ems. Patient complaining of only chronic knee arthritis pain. Patient alert and oriented x4. 12 lead unremarkable with ems.

## 2017-10-08 NOTE — Discharge Instructions (Signed)
It was a pleasure to take care of you Natalie Bullock. You are likely having dizziness as a result of dehydration and high blood pressure. Please make sure that you are keeping adequately hydrated and follow up with your primary care provider and Dr. Stanford Breed within a week.

## 2017-10-09 NOTE — Telephone Encounter (Signed)
Patient was seen in the ER yesterday and has a follow up appointment Monday.

## 2017-10-11 NOTE — Progress Notes (Signed)
Cardiology Office Note   Date:  10/12/2017   ID:  ESTIE SPROULE, DOB 1924-10-29, MRN [id number]  PCP:  Seward Carol, MD  Cardiologist:   Chief Complaint  Patient presents with  . Hypertension     History of Present Illness: Natalie Bullock is a 84 y.o. female who presents for post ER visit after being evaluated for dizziness.  The patient called our office prior to going to the ER with an elevated blood pressure greater than 269 mmHg systolic.  The patient did take amlodipine 5 mg prior to being picked up by EMS.    Patient has a history of hypertension, ischemic heart disease, coronary artery disease status post CABG, breast cancer with a history of right-sided mastectomy, hyperlipidemia, and hypokalemia with vitamin deficiency.  The patient admitted to medical noncompliance not taking amlodipine over the last few months, but stated that she had been taking a new diuretic medication.  It was thought that the patient's dizziness was related to mild dehydration she was given IV fluids and responded well.  The patient comes today not having taken her medications as directed.  She normally does not take her medications unless she eats breakfast.  The patient states that she was running behind schedule and has not eaten yet.  The patient states that she gets confused sometimes but when she should take her medicines and how she is to take them.  The patient lives alone, and does have a scheduled for her medicines, but she is uncertain which ones to take at which time.  Past Medical History:  Diagnosis Date  . Anemia   . Arthritis    osteoarthritis  . Breast cancer ([facility]) 1975   Right sided mastectomy  . Coronary artery disease   . H/O hypokalemia   . Hx of CABG    pt states she had 5 bypasses  . Hyperlipidemia   . Hypertension   . Hypothyroidism   . IHD (ischemic heart disease)    with CABG in August 2002  . Myocardial infarction (HCC)    Non Q-wave MI  . Vitamin D deficiency     Past  Surgical History:  Procedure Laterality Date  . BREAST SURGERY     left breast cyst surgery   . CARDIAC CATHETERIZATION  01/20/2001   NORMAL. EF 60-70%  . CARDIOVASCULAR STRESS TEST  Aug 2010   Normal; EF 74%  . CORONARY ARTERY BYPASS GRAFT  12/2000   LEFT INTERNAL MAMMARY TO THE LAD, SAPHENOUS VEIN GRAFT TO THE FIRST DIAGONAL, SAPHENOUS VEIN GRAFT TO THE RAMUS INTERMEDIATE, AND A SEQUENTIAL SAPHENOUS VEIN GRAFT TO THE POSTERIOR DESCENDING AND POSTERIOR LATERAL BRANCHES.  Marland Kitchen MASS EXCISION  04/08/2011   Procedure: EXCISION MASS;  Surgeon: Wynonia Sours, MD;  Location: [facility];  Service: Orthopedics;  Laterality: Left;  excision mass left palm  . MASTECTOMY  1975   RIGHT SIDE  . TUBAL LIGATION     1960's     Current Outpatient Medications  Medication Sig Dispense Refill  . acetaminophen (TYLENOL) 500 MG tablet Take 500 mg by mouth every 6 (six) hours as needed.    . Ascorbic Acid (VITAMIN C IMMUNE HEALTH PO) Take 1 tablet by mouth daily.    Marland Kitchen aspirin 81 MG tablet Take 81 mg by mouth daily.     Marland Kitchen levothyroxine (SYNTHROID, LEVOTHROID) 50 MCG tablet Take 50 mcg by mouth daily before breakfast.     . Polyethyl Glycol-Propyl Glycol (SYSTANE OP) Place 2  drops into both eyes every morning. For eye dryness.    . pravastatin (PRAVACHOL) 20 MG tablet TAKE 1 TABLET (20 MG TOTAL) BY MOUTH AT BEDTIME. 90 tablet 3  . triamterene-hydrochlorothiazide (MAXZIDE-25) 37.5-25 MG tablet Take 1 tablet by mouth daily.    Marland Kitchen amLODipine (NORVASC) 5 MG tablet Take 1 tablet (5 mg total) by mouth at bedtime. 180 tablet 3   No current facility-administered medications for this visit.     Allergies:   Valium; Codeine; Crestor [rosuvastatin calcium]; Doxycycline; Ezetimibe-simvastatin; Lipitor [atorvastatin calcium]; Sulfa drugs cross reactors; Tequin; and Zocor [simvastatin]    Social History:  The patient  reports that she quit smoking about 48 years ago. Her smoking use included cigarettes. She has  a 4.00 pack-year smoking history. She has never used smokeless tobacco. She reports that she does not drink alcohol or use drugs.   Family History:  The patient's family history includes Alzheimer's disease in her mother.    ROS: All other systems are reviewed and negative. Unless otherwise mentioned in H&P    PHYSICAL EXAM: VS:  BP (!) 185/79   Pulse 66   Ht 5\' 4"  (1.626 m)   Wt 130 lb 6.4 oz (59.1 kg)   BMI 22.38 kg/m  , BMI Body mass index is 22.38 kg/m. GEN: Well nourished, well developed, in no acute distress  HEENT: normal  Neck: no JVD, carotid bruits, or masses Cardiac: RRR; 1/6 systolic murmurs, rubs, or gallops,no edema  Respiratory:  clear to auscultation bilaterally, normal work of breathing GI: soft, nontender, nondistended, + BS MS: no deformity or atrophy  Skin: warm and dry, no rash Neuro:  Strength and sensation are intact Psych: euthymic mood, full affect   EKG: Not completed during this office visit  Recent Labs: 10/08/2017: ALT 10; BUN 15; Creatinine, Ser 0.66; Hemoglobin 11.0; Platelets 166; Potassium 3.7; Sodium 141    Lipid Panel    Component Value Date/Time   CHOL 125 03/26/2013 1425   TRIG 57 03/26/2013 1425   HDL 56 03/26/2013 1425   CHOLHDL 2.2 03/26/2013 1425   VLDL 11 03/26/2013 1425   LDLCALC 58 03/26/2013 1425      Wt Readings from Last 3 Encounters:  10/12/17 130 lb 6.4 oz (59.1 kg)  10/08/17 136 lb (61.7 kg)  08/16/17 128 lb (58.1 kg)        ASSESSMENT AND PLAN:  1.  Hypertension: The patient has not taking her medications today.  She is confused on how she should take them what times a day she should take them.  She knows what her medications are for but does not like to take them on empty stomach.  I advised her to take triamterene HCTZ in the morning and I have marked the bottle to say so, she can take this with her Synthroid in the morning.  She will take pravastatin and amlodipine the evening I have marked her bottles as  well.  We have also highlighted her sign out sheet so that she can see when she is to take medications and which ones she may take together.  We will have her follow-up in a month for reevaluation of her blood pressure and her response to medication.  2.  History of coronary artery disease: Continue medical management.  She denies any recurrent chest discomfort.  3.  Hypercholesterolemia: Continue statin therapy.  Current medicines are reviewed at length with the patient today .    Labs/ tests ordered today include: None Labs from recent  ER visit are reviewed.   Phill Myron. West Pugh, ANP, AACC   10/12/2017 12:06 PM    [facility] [address], [place], [place] [address] Phone: [phone]; Fax: [phone]

## 2017-10-12 ENCOUNTER — Ambulatory Visit (INDEPENDENT_AMBULATORY_CARE_PROVIDER_SITE_OTHER): Payer: Medicare Other | Admitting: Adult Health

## 2017-10-12 ENCOUNTER — Encounter: Payer: Self-pay | Admitting: Adult Health

## 2017-10-12 VITALS — BP 185/79 | HR 66 | Ht 64.0 in | Wt 130.4 lb

## 2017-10-12 DIAGNOSIS — I10 Essential (primary) hypertension: Secondary | ICD-10-CM

## 2017-10-12 DIAGNOSIS — I358 Other nonrheumatic aortic valve disorders: Secondary | ICD-10-CM | POA: Diagnosis not present

## 2017-10-12 DIAGNOSIS — I2510 Atherosclerotic heart disease of native coronary artery without angina pectoris: Secondary | ICD-10-CM | POA: Diagnosis not present

## 2017-10-12 DIAGNOSIS — E7800 Pure hypercholesterolemia, unspecified: Secondary | ICD-10-CM

## 2017-10-12 MED ORDER — AMLODIPINE BESYLATE 5 MG PO TABS: 5.0000 mg | ORAL_TABLET | Freq: Every day | ORAL | 3 refills | Status: DC

## 2017-10-12 NOTE — Patient Instructions (Signed)
Medication Instructions:  Take in the morning:  Triamterene-Hydrochlororthiazide 37.5-25 mg   Synthroid 50 mcg   Take at night:  Amlodipine 5 mg Pravastain 20 mg    Follow-Up: Your physician recommends that you schedule a follow-up appointment in: 3 months with Dr. Stanford Breed   Any Other Special Instructions Will Be Listed Below (If Applicable).     If you need a refill on your cardiac medications before your next appointment, please call your pharmacy.

## 2017-10-16 ENCOUNTER — Other Ambulatory Visit: Payer: Self-pay | Admitting: Cardiology

## 2017-10-16 NOTE — Telephone Encounter (Signed)
Rx sent to pharmacy   

## 2017-10-22 DIAGNOSIS — I10 Essential (primary) hypertension: Secondary | ICD-10-CM | POA: Diagnosis not present

## 2017-10-22 DIAGNOSIS — E7800 Pure hypercholesterolemia, unspecified: Secondary | ICD-10-CM | POA: Diagnosis not present

## 2017-10-22 DIAGNOSIS — E039 Hypothyroidism, unspecified: Secondary | ICD-10-CM | POA: Diagnosis not present

## 2017-10-28 ENCOUNTER — Ambulatory Visit: Payer: POS | Admitting: Adult Health

## 2018-01-04 ENCOUNTER — Other Ambulatory Visit: Payer: Self-pay | Admitting: Cardiology

## 2018-01-08 NOTE — Progress Notes (Signed)
HPI: FU CAD and syncope. She has h/o CABG in 2002 and HTN. Patient has had difficulties with recurrent syncope. Last Myoview 10/12: No scar or ischemia, EF 74%. Carotid Dopplers March 2015 showed no significant stenosis. Event monitor 9/13 demonstrated no significant arrhythmias. Patient was seen by Dr. Rayann Heman 10/14 for syncope and her diuretics were discontinued. Patient did have some bradycardia on telemetry that was felt unrelated to her syncopal episodes. She was offered an implantable loop but she declined. Last echocardiogram March 2019 showed normal LV function, moderate mitral regurgitation, severe left atrial enlargement and mild tricuspid regurgitation. Since last seen,  patient denies dyspnea, chest pain, palpitations or syncope.  Current Outpatient Medications  Medication Sig Dispense Refill  . acetaminophen (TYLENOL) 500 MG tablet Take 500 mg by mouth every 6 (six) hours as needed.    Marland Kitchen amLODipine (NORVASC) 5 MG tablet Take 5 mg by mouth at bedtime.    . Ascorbic Acid (VITAMIN C IMMUNE HEALTH PO) Take 1 tablet by mouth daily.    Marland Kitchen aspirin 81 MG tablet Take 81 mg by mouth daily.     Marland Kitchen levothyroxine (SYNTHROID, LEVOTHROID) 50 MCG tablet Take 50 mcg by mouth daily before breakfast.     . Polyethyl Glycol-Propyl Glycol (SYSTANE OP) Place 2 drops into both eyes every morning. For eye dryness.    . pravastatin (PRAVACHOL) 20 MG tablet TAKE 1 TABLET (20 MG TOTAL) BY MOUTH AT BEDTIME. 90 tablet 0  . triamterene-hydrochlorothiazide (MAXZIDE-25) 37.5-25 MG tablet Take 1 tablet by mouth daily.     No current facility-administered medications for this visit.      Past Medical History:  Diagnosis Date  . Anemia   . Arthritis    osteoarthritis  . Breast cancer ([facility]) 1975   Right sided mastectomy  . Coronary artery disease   . H/O hypokalemia   . Hx of CABG    pt states she had 5 bypasses  . Hyperlipidemia   . Hypertension   . Hypothyroidism   . IHD (ischemic heart disease)    with CABG in August 2002  . Myocardial infarction (HCC)    Non Q-wave MI  . Vitamin D deficiency     Past Surgical History:  Procedure Laterality Date  . BREAST SURGERY     left breast cyst surgery   . CARDIAC CATHETERIZATION  01/20/2001   NORMAL. EF 60-70%  . CARDIOVASCULAR STRESS TEST  Aug 2010   Normal; EF 74%  . CORONARY ARTERY BYPASS GRAFT  12/2000   LEFT INTERNAL MAMMARY TO THE LAD, SAPHENOUS VEIN GRAFT TO THE FIRST DIAGONAL, SAPHENOUS VEIN GRAFT TO THE RAMUS INTERMEDIATE, AND A SEQUENTIAL SAPHENOUS VEIN GRAFT TO THE POSTERIOR DESCENDING AND POSTERIOR LATERAL BRANCHES.  Marland Kitchen MASS EXCISION  04/08/2011   Procedure: EXCISION MASS;  Surgeon: Wynonia Sours, MD;  Location: [facility];  Service: Orthopedics;  Laterality: Left;  excision mass left palm  . MASTECTOMY  1975   RIGHT SIDE  . TUBAL LIGATION     1960's    Social History   Socioeconomic History  . Marital status: Widowed    Spouse name: Not on file  . Number of children: Not on file  . Years of education: Not on file  . Highest education level: Not on file  Occupational History  . Not on file  Social Needs  . Financial resource strain: Not on file  . Food insecurity:    Worry: Not on file  Inability: Not on file  . Transportation needs:    Medical: Not on file    Non-medical: Not on file  Tobacco Use  . Smoking status: Former Smoker    Packs/day: 1.00    Years: 4.00    Pack years: 4.00    Types: Cigarettes    Last attempt to quit: 06/02/1969    Years since quitting: 48.6  . Smokeless tobacco: Never Used  Substance and Sexual Activity  . Alcohol use: No  . Drug use: No  . Sexual activity: Never  Lifestyle  . Physical activity:    Days per week: Not on file    Minutes per session: Not on file  . Stress: Not on file  Relationships  . Social connections:    Talks on phone: Not on file    Gets together: Not on file    Attends religious service: Not on file    Active member of club or  organization: Not on file    Attends meetings of clubs or organizations: Not on file    Relationship status: Not on file  . Intimate partner violence:    Fear of current or ex partner: Not on file    Emotionally abused: Not on file    Physically abused: Not on file    Forced sexual activity: Not on file  Other Topics Concern  . Not on file  Social History Narrative  . Not on file    Family History  Problem Relation Age of Onset  . Alzheimer's disease Mother     ROS: Arthralgias but no fevers or chills, productive cough, hemoptysis, dysphasia, odynophagia, melena, hematochezia, dysuria, hematuria, rash, seizure activity, orthopnea, PND, pedal edema, claudication. Remaining systems are negative.  Physical Exam: Well-developed well-nourished in no acute distress.  Skin is warm and dry.  HEENT is normal.  Neck is supple.  Chest is clear to auscultation with normal expansion.  Cardiovascular exam is regular rate and rhythm.  Abdominal exam nontender or distended. No masses palpated. Extremities show no edema. neuro grossly intact  A/P  1 coronary artery disease-patient denies chest pain.  Continue medical therapy with aspirin and statin.  2 hyperlipidemia-continue statin.  Lipids and liver monitored by primary care.  3 hypertension-blood pressure is controlled today.  However she states the triamterene/HCTZ is causing her to urinate too much.  She would like to try one half daily.  I have allowed her to continue to do that and we will follow blood pressure and adjust regimen as needed.  If it increases we could increase amlodipine.  4 history of syncope-no recent episodes.  Kirk Ruths, MD

## 2018-01-12 ENCOUNTER — Ambulatory Visit (INDEPENDENT_AMBULATORY_CARE_PROVIDER_SITE_OTHER): Payer: Medicare Other | Admitting: Cardiology

## 2018-01-12 ENCOUNTER — Encounter: Payer: Self-pay | Admitting: Cardiology

## 2018-01-12 VITALS — BP 140/60 | HR 65 | Ht 64.0 in | Wt 132.4 lb

## 2018-01-12 DIAGNOSIS — I2510 Atherosclerotic heart disease of native coronary artery without angina pectoris: Secondary | ICD-10-CM | POA: Diagnosis not present

## 2018-01-12 DIAGNOSIS — I10 Essential (primary) hypertension: Secondary | ICD-10-CM | POA: Diagnosis not present

## 2018-01-12 DIAGNOSIS — E7800 Pure hypercholesterolemia, unspecified: Secondary | ICD-10-CM | POA: Diagnosis not present

## 2018-01-12 NOTE — Patient Instructions (Signed)
Your physician wants you to follow-up in: 6 MONTHS WITH DR CRENSHAW You will receive a reminder letter in the mail two months in advance. If you don't receive a letter, please call our office to schedule the follow-up appointment.   If you need a refill on your cardiac medications before your next appointment, please call your pharmacy.  

## 2018-01-21 ENCOUNTER — Ambulatory Visit
Admission: RE | Admit: 2018-01-21 | Discharge: 2018-01-21 | Disposition: A | Discharge status: Home / Self Care | Payer: Medicare Other | Source: Ambulatory Visit | Attending: Internal Medicine | Admitting: Internal Medicine

## 2018-01-21 ENCOUNTER — Other Ambulatory Visit: Payer: Self-pay | Admitting: Internal Medicine

## 2018-01-21 DIAGNOSIS — E7800 Pure hypercholesterolemia, unspecified: Secondary | ICD-10-CM | POA: Diagnosis not present

## 2018-01-21 DIAGNOSIS — M25562 Pain in left knee: Secondary | ICD-10-CM

## 2018-01-21 DIAGNOSIS — M1712 Unilateral primary osteoarthritis, left knee: Secondary | ICD-10-CM | POA: Diagnosis not present

## 2018-01-21 DIAGNOSIS — I10 Essential (primary) hypertension: Secondary | ICD-10-CM | POA: Diagnosis not present

## 2018-01-21 DIAGNOSIS — M25569 Pain in unspecified knee: Secondary | ICD-10-CM | POA: Diagnosis not present

## 2018-01-21 DIAGNOSIS — E039 Hypothyroidism, unspecified: Secondary | ICD-10-CM | POA: Diagnosis not present

## 2018-02-10 DIAGNOSIS — M25562 Pain in left knee: Secondary | ICD-10-CM | POA: Diagnosis not present

## 2018-03-30 ENCOUNTER — Other Ambulatory Visit: Payer: Self-pay | Admitting: Cardiology

## 2018-04-12 DIAGNOSIS — Z23 Encounter for immunization: Secondary | ICD-10-CM | POA: Diagnosis not present

## 2018-04-22 DIAGNOSIS — M25562 Pain in left knee: Secondary | ICD-10-CM | POA: Diagnosis not present

## 2018-04-22 DIAGNOSIS — E7800 Pure hypercholesterolemia, unspecified: Secondary | ICD-10-CM | POA: Diagnosis not present

## 2018-04-22 DIAGNOSIS — W19XXXA Unspecified fall, initial encounter: Secondary | ICD-10-CM | POA: Diagnosis not present

## 2018-04-22 DIAGNOSIS — E039 Hypothyroidism, unspecified: Secondary | ICD-10-CM | POA: Diagnosis not present

## 2018-04-22 DIAGNOSIS — L989 Disorder of the skin and subcutaneous tissue, unspecified: Secondary | ICD-10-CM | POA: Diagnosis not present

## 2018-04-22 DIAGNOSIS — I10 Essential (primary) hypertension: Secondary | ICD-10-CM | POA: Diagnosis not present

## 2018-05-05 DIAGNOSIS — M25562 Pain in left knee: Secondary | ICD-10-CM | POA: Diagnosis not present

## 2018-07-04 ENCOUNTER — Other Ambulatory Visit: Payer: Self-pay | Admitting: Cardiology

## 2018-07-05 NOTE — Telephone Encounter (Signed)
Rx request sent to pharmacy.  

## 2018-09-28 ENCOUNTER — Telehealth: Payer: Self-pay

## 2018-09-28 NOTE — Telephone Encounter (Signed)
Called the patient's nephew [name] about switching his aunt's upcoming appointment to a virtual visit. Mr Real Cons stated he is aware of virtual visit and would like to switch and also he wanted to make Dr. Stanford Breed aware that the patient had a fall today 09/28/18, Mrs. Brazee was not hurt in the fall and Mr. Real Cons asked how would he go about getting a referral for physical therapy for his aunt. Informed Mr. Real Cons that I would pass along the information about the fall and in inquiry about the referral for physical therapy.

## 2018-09-28 NOTE — Telephone Encounter (Signed)
Virtual Visit Pre-Appointment Phone Call  "[name], I am calling you today to discuss your upcoming appointment. We are currently trying to limit exposure to the virus that causes COVID-19 by seeing patients at home rather than in the office."  1. "What is the BEST phone number to call the day of the visit?" - include this in appointment notes Home number   2. "Do you have or have access to (through a family member/friend) a smartphone with video capability that we can use for your visit?" a. If yes - list this number in appt notes as "cell" (if different from BEST phone #) and list the appointment type as a VIDEO visit in appointment notes b. If no - list the appointment type as a PHONE visit in appointment notes. Call/text [name] [phone] or [name] [phone]  3. Confirm consent - "In the setting of the current Covid19 crisis, you are scheduled for a VIDEO visit with Dr. Stanford Breed on 10/01/2018 at 4:00PM.  Just as we do with many in-office visits, in order for you to participate in this visit, we must obtain consent.  If you'd like, I can send this to your mychart (if signed up) or email for you to review.  Otherwise, I can obtain your verbal consent now.  All virtual visits are billed to your insurance company just like a normal visit would be.  By agreeing to a virtual visit, we'd like you to understand that the technology does not allow for your provider to perform an examination, and thus may limit your provider's ability to fully assess your condition. If your provider identifies any concerns that need to be evaluated in person, we will make arrangements to do so.  Finally, though the technology is pretty good, we cannot assure that it will always work on either your or our end, and in the setting of a video visit, we may have to convert it to a phone-only visit.  In either situation, we cannot ensure that we have a secure connection.  Are you willing to  proceed?" STAFF: Did the patient verbally acknowledge consent to telehealth visit? Document YES/NO here: YES  4. Advise patient to be prepared - "Two hours prior to your appointment, go ahead and check your blood pressure, pulse, oxygen saturation, and your weight (if you have the equipment to check those) and write them all down. When your visit starts, your provider will ask you for this information. If you have an Apple Watch or Kardia device, please plan to have heart rate information ready on the day of your appointment. Please have a pen and paper handy nearby the day of the visit as well."  5. Give patient instructions for MyChart download to smartphone OR Doximity/Doxy.me as below if video visit (depending on what platform provider is using)  6. Inform patient they will receive a phone call 15 minutes prior to their appointment time (may be from unknown caller ID) so they should be prepared to answer    TELEPHONE CALL NOTE  Natalie Bullock has been deemed a candidate for a follow-up tele-health visit to limit community exposure during the Covid-19 pandemic. I spoke with the patient via phone to ensure availability of phone/video source, confirm preferred email & phone number, and discuss instructions and expectations.  I reminded Natalie Bullock to be prepared with any vital sign and/or heart rhythm information that could potentially be obtained via home monitoring, at the time of her  visit. I reminded Natalie Bullock to expect a phone call prior to her visit.  Jacqulynn Cadet, CMA 09/28/2018 3:36 PM   INSTRUCTIONS FOR DOWNLOADING THE MYCHART APP TO SMARTPHONE  - The patient must first make sure to have activated MyChart and know their login information - If Apple, go to [employer] and type in MyChart in the search bar and download the app. If Android, ask patient to go to [employer] and type in [place] in the search bar and download the app. The app is free but as with any other app  downloads, their phone may require them to verify saved payment information or Apple/Android password.  - The patient will need to then log into the app with their MyChart username and password, and select [facility] as their healthcare provider to link the account. When it is time for your visit, go to the MyChart app, find appointments, and click Begin Video Visit. Be sure to Select Allow for your device to access the Microphone and Camera for your visit. You will then be connected, and your provider will be with you shortly.  **If they have any issues connecting, or need assistance please contact MyChart service desk [phone])**  **If using a computer, in order to ensure the best quality for their visit they will need to use either of the following Internet Browsers: [employer], or Google Chrome**  IF USING DOXIMITY or DOXY.ME - The patient will receive a link just prior to their visit by text.     FULL LENGTH CONSENT FOR TELE-HEALTH VISIT   I hereby voluntarily request, consent and authorize [facility] and its employed or contracted physicians, physician assistants, nurse practitioners or other licensed health care professionals (the Practitioner), to provide me with telemedicine health care services (the "Services") as deemed necessary by the treating Practitioner. I acknowledge and consent to receive the Services by the Practitioner via telemedicine. I understand that the telemedicine visit will involve communicating with the Practitioner through live audiovisual communication technology and the disclosure of certain medical information by electronic transmission. I acknowledge that I have been given the opportunity to request an in-person assessment or other available alternative prior to the telemedicine visit and am voluntarily participating in the telemedicine visit.  I understand that I have the right to withhold or withdraw my consent to the use of telemedicine  in the course of my care at any time, without affecting my right to future care or treatment, and that the Practitioner or I may terminate the telemedicine visit at any time. I understand that I have the right to inspect all information obtained and/or recorded in the course of the telemedicine visit and may receive copies of available information for a reasonable fee.  I understand that some of the potential risks of receiving the Services via telemedicine include:  Marland Kitchen Delay or interruption in medical evaluation due to technological equipment failure or disruption; . Information transmitted may not be sufficient (e.g. poor resolution of images) to allow for appropriate medical decision making by the Practitioner; and/or  . In rare instances, security protocols could fail, causing a breach of personal health information.  Furthermore, I acknowledge that it is my responsibility to provide information about my medical history, conditions and care that is complete and accurate to the best of my ability. I acknowledge that Practitioner's advice, recommendations, and/or decision may be based on factors not within their control, such as incomplete or inaccurate data provided by me or  distortions of diagnostic images or specimens that may result from electronic transmissions. I understand that the practice of medicine is not an exact science and that Practitioner makes no warranties or guarantees regarding treatment outcomes. I acknowledge that I will receive a copy of this consent concurrently upon execution via email to the email address I last provided but may also request a printed copy by calling the office of [facility].    I understand that my insurance will be billed for this visit.   I have read or had this consent read to me. . I understand the contents of this consent, which adequately explains the benefits and risks of the Services being provided via telemedicine.  . I have been provided ample  opportunity to ask questions regarding this consent and the Services and have had my questions answered to my satisfaction. . I give my informed consent for the services to be provided through the use of telemedicine in my medical care  By participating in this telemedicine visit I agree to the above.

## 2018-09-29 DIAGNOSIS — R269 Unspecified abnormalities of gait and mobility: Secondary | ICD-10-CM | POA: Diagnosis not present

## 2018-09-29 DIAGNOSIS — W19XXXA Unspecified fall, initial encounter: Secondary | ICD-10-CM | POA: Diagnosis not present

## 2018-09-29 NOTE — Progress Notes (Signed)
Virtual Visit via Video Note   This visit type was conducted due to national recommendations for restrictions regarding the COVID-19 Pandemic (e.g. social distancing) in an effort to limit this patient's exposure and mitigate transmission in our community.  Due to her co-morbid illnesses, this patient is at least at moderate risk for complications without adequate follow up.  This format is felt to be most appropriate for this patient at this time.  All issues noted in this document were discussed and addressed.  A limited physical exam was performed with this format.  Please refer to the patient's chart for her consent to telehealth for [facility].   Evaluation Performed:  Follow-up visit  Date:  10/01/2018   ID:  Natalie Bullock, DOB 03-16-1925, MRN [id number]  Patient Location: Home Provider Location: Home  PCP:  Seward Carol, MD  Cardiologist:  Dr Stanford Breed  Chief Complaint:  FU CAD  History of Present Illness:    FU CAD and syncope. She has h/o CABG in 2002 and HTN. Patient has had difficulties with recurrent syncope. Last Myoview 10/12: No scar or ischemia, EF 74%. Carotid Dopplers March 2015 showed no significant stenosis. Event monitor 9/13 demonstrated no significant arrhythmias. Patient was seen by Dr. Rayann Heman 10/14 for syncope and her diuretics were discontinued. Patient did have some bradycardia on telemetry that was felt unrelated to her syncopal episodes. She was offered an implantable loop but she declined. Last echocardiogram March 2019 showed normal LV function, moderate mitral regurgitation, severe left atrial enlargement and mild tricuspid regurgitation. Since last seen,  patient denies dyspnea, chest pain, palpitations or syncope.  She did fall recently.  The patient does not have symptoms concerning for COVID-19 infection (fever, chills, cough, or new shortness of breath).    Past Medical History:  Diagnosis Date  . Anemia   . Arthritis    osteoarthritis  .  Breast cancer ([place]) 1975   Right sided mastectomy  . Coronary artery disease   . H/O hypokalemia   . Hx of CABG    pt states she had 5 bypasses  . Hyperlipidemia   . Hypertension   . Hypothyroidism   . IHD (ischemic heart disease)    with CABG in August 2002  . Myocardial infarction (HCC)    Non Q-wave MI  . Vitamin D deficiency    Past Surgical History:  Procedure Laterality Date  . BREAST SURGERY     left breast cyst surgery   . CARDIAC CATHETERIZATION  01/20/2001   NORMAL. EF 60-70%  . CARDIOVASCULAR STRESS TEST  Aug 2010   Normal; EF 74%  . CORONARY ARTERY BYPASS GRAFT  12/2000   LEFT INTERNAL MAMMARY TO THE LAD, SAPHENOUS VEIN GRAFT TO THE FIRST DIAGONAL, SAPHENOUS VEIN GRAFT TO THE RAMUS INTERMEDIATE, AND A SEQUENTIAL SAPHENOUS VEIN GRAFT TO THE POSTERIOR DESCENDING AND POSTERIOR LATERAL BRANCHES.  Marland Kitchen MASS EXCISION  04/08/2011   Procedure: EXCISION MASS;  Surgeon: Wynonia Sours, MD;  Location: [place];  Service: Orthopedics;  Laterality: Left;  excision mass left palm  . MASTECTOMY  1975   RIGHT SIDE  . TUBAL LIGATION     1960's     Current Meds  Medication Sig  . acetaminophen (TYLENOL) 500 MG tablet Take 500 mg by mouth every 6 (six) hours as needed.  Marland Kitchen amLODipine (NORVASC) 5 MG tablet Take 5 mg by mouth at bedtime.  . Ascorbic Acid (VITAMIN C IMMUNE HEALTH PO) Take 1 tablet by mouth daily.  Marland Kitchen  aspirin 81 MG tablet Take 81 mg by mouth daily.   Marland Kitchen levothyroxine (SYNTHROID, LEVOTHROID) 50 MCG tablet Take 50 mcg by mouth daily before breakfast.   . Polyethyl Glycol-Propyl Glycol (SYSTANE OP) Place 2 drops into both eyes every morning. For eye dryness.  . pravastatin (PRAVACHOL) 20 MG tablet TAKE 1 TABLET (20 MG TOTAL) BY MOUTH AT BEDTIME.  Marland Kitchen triamterene-hydrochlorothiazide (MAXZIDE-25) 37.5-25 MG tablet Take 1 tablet by mouth daily.     Allergies:   Valium; Codeine; Crestor [rosuvastatin calcium]; Doxycycline; Ezetimibe-simvastatin; Lipitor [atorvastatin  calcium]; Sulfa drugs cross reactors; Tequin; and Zocor [simvastatin]   Social History   Tobacco Use  . Smoking status: Former Smoker    Packs/day: 1.00    Years: 4.00    Pack years: 4.00    Types: Cigarettes    Last attempt to quit: 06/02/1969    Years since quitting: 49.3  . Smokeless tobacco: Never Used  Substance Use Topics  . Alcohol use: No  . Drug use: No     Family Hx: The patient's family history includes Alzheimer's disease in her mother.  ROS:   Please see the history of present illness.    No fevers, chills or productive cough. All other systems reviewed and are negative.  Recent Labs: 10/08/2017: ALT 10; BUN 15; Creatinine, Ser 0.66; Hemoglobin 11.0; Platelets 166; Potassium 3.7; Sodium 141   Recent Lipid Panel Lab Results  Component Value Date/Time   CHOL 125 03/26/2013 02:25 PM   TRIG 57 03/26/2013 02:25 PM   HDL 56 03/26/2013 02:25 PM   CHOLHDL 2.2 03/26/2013 02:25 PM   LDLCALC 58 03/26/2013 02:25 PM    Wt Readings from Last 3 Encounters:  01/12/18 132 lb 6.4 oz (60.1 kg)  10/12/17 130 lb 6.4 oz (59.1 kg)  10/08/17 136 lb (61.7 kg)     Objective:    Vital Signs:  BP 112/64    VITAL SIGNS:  reviewed  Patient answers questions appropriately. Normal affect. No acute distress. Remainder of physical examination not performed (telehealth visit; coronavirus pandemic)  ASSESSMENT & PLAN:    1. Coronary artery disease-patient denies chest pain.  Plan to continue medical therapy with aspirin and statin. 2. Hypertension-patient's blood pressure is controlled by report.  Continue present medications and follow. 3. Hyperlipidemia-continue statin.  Lipids and liver monitored by primary care. 4. History of syncope-patient did have a recent fall but denies syncope.  COVID-19 Education: The importance of social distancing was discussed today.  Time:   Today, I have spent 15 minutes with the patient with telehealth technology discussing the above problems.      Medication Adjustments/Labs and Tests Ordered: Current medicines are reviewed at length with the patient today.  Concerns regarding medicines are outlined above.   Tests Ordered: No orders of the defined types were placed in this encounter.   Medication Changes: No orders of the defined types were placed in this encounter.   Disposition:  Follow up in 6 month(s)  Signed, Kirk Ruths, MD  10/01/2018 3:51 PM    [facility]

## 2018-09-29 NOTE — Telephone Encounter (Signed)
Needs to discuss with primary care Kirk Ruths

## 2018-09-29 NOTE — Telephone Encounter (Signed)
Spoke with pt nephew, he spoke to the medical doctor today.

## 2018-09-30 ENCOUNTER — Telehealth: Payer: Self-pay | Admitting: Cardiology

## 2018-09-30 DIAGNOSIS — Z7982 Long term (current) use of aspirin: Secondary | ICD-10-CM | POA: Diagnosis not present

## 2018-09-30 DIAGNOSIS — I10 Essential (primary) hypertension: Secondary | ICD-10-CM | POA: Diagnosis not present

## 2018-09-30 DIAGNOSIS — I2510 Atherosclerotic heart disease of native coronary artery without angina pectoris: Secondary | ICD-10-CM | POA: Diagnosis not present

## 2018-09-30 DIAGNOSIS — W19XXXD Unspecified fall, subsequent encounter: Secondary | ICD-10-CM | POA: Diagnosis not present

## 2018-09-30 NOTE — Telephone Encounter (Signed)
Declined mychart, no smartphone, pre reg complete 09/30/18 AF

## 2018-10-01 ENCOUNTER — Telehealth (INDEPENDENT_AMBULATORY_CARE_PROVIDER_SITE_OTHER): Payer: Medicare Other | Admitting: Cardiology

## 2018-10-01 ENCOUNTER — Encounter: Payer: Self-pay | Admitting: Cardiology

## 2018-10-01 VITALS — BP 112/64

## 2018-10-01 DIAGNOSIS — I2510 Atherosclerotic heart disease of native coronary artery without angina pectoris: Secondary | ICD-10-CM | POA: Diagnosis not present

## 2018-10-01 DIAGNOSIS — I10 Essential (primary) hypertension: Secondary | ICD-10-CM

## 2018-10-01 DIAGNOSIS — E7800 Pure hypercholesterolemia, unspecified: Secondary | ICD-10-CM

## 2018-10-01 NOTE — Patient Instructions (Signed)

## 2018-10-04 DIAGNOSIS — I10 Essential (primary) hypertension: Secondary | ICD-10-CM | POA: Diagnosis not present

## 2018-10-04 DIAGNOSIS — I2510 Atherosclerotic heart disease of native coronary artery without angina pectoris: Secondary | ICD-10-CM | POA: Diagnosis not present

## 2018-10-04 DIAGNOSIS — Z7982 Long term (current) use of aspirin: Secondary | ICD-10-CM | POA: Diagnosis not present

## 2018-10-04 DIAGNOSIS — W19XXXD Unspecified fall, subsequent encounter: Secondary | ICD-10-CM | POA: Diagnosis not present

## 2018-10-07 DIAGNOSIS — I10 Essential (primary) hypertension: Secondary | ICD-10-CM | POA: Diagnosis not present

## 2018-10-07 DIAGNOSIS — I2510 Atherosclerotic heart disease of native coronary artery without angina pectoris: Secondary | ICD-10-CM | POA: Diagnosis not present

## 2018-10-07 DIAGNOSIS — Z7982 Long term (current) use of aspirin: Secondary | ICD-10-CM | POA: Diagnosis not present

## 2018-10-07 DIAGNOSIS — W19XXXD Unspecified fall, subsequent encounter: Secondary | ICD-10-CM | POA: Diagnosis not present

## 2018-10-11 DIAGNOSIS — W19XXXD Unspecified fall, subsequent encounter: Secondary | ICD-10-CM | POA: Diagnosis not present

## 2018-10-11 DIAGNOSIS — I2510 Atherosclerotic heart disease of native coronary artery without angina pectoris: Secondary | ICD-10-CM | POA: Diagnosis not present

## 2018-10-11 DIAGNOSIS — Z7982 Long term (current) use of aspirin: Secondary | ICD-10-CM | POA: Diagnosis not present

## 2018-10-11 DIAGNOSIS — I10 Essential (primary) hypertension: Secondary | ICD-10-CM | POA: Diagnosis not present

## 2018-10-13 DIAGNOSIS — W19XXXD Unspecified fall, subsequent encounter: Secondary | ICD-10-CM | POA: Diagnosis not present

## 2018-10-13 DIAGNOSIS — I2510 Atherosclerotic heart disease of native coronary artery without angina pectoris: Secondary | ICD-10-CM | POA: Diagnosis not present

## 2018-10-13 DIAGNOSIS — I10 Essential (primary) hypertension: Secondary | ICD-10-CM | POA: Diagnosis not present

## 2018-10-13 DIAGNOSIS — Z7982 Long term (current) use of aspirin: Secondary | ICD-10-CM | POA: Diagnosis not present

## 2018-10-19 ENCOUNTER — Telehealth: Payer: Self-pay | Admitting: Cardiology

## 2018-10-19 DIAGNOSIS — W19XXXD Unspecified fall, subsequent encounter: Secondary | ICD-10-CM | POA: Diagnosis not present

## 2018-10-19 DIAGNOSIS — I10 Essential (primary) hypertension: Secondary | ICD-10-CM | POA: Diagnosis not present

## 2018-10-19 DIAGNOSIS — Z7982 Long term (current) use of aspirin: Secondary | ICD-10-CM | POA: Diagnosis not present

## 2018-10-19 DIAGNOSIS — I2510 Atherosclerotic heart disease of native coronary artery without angina pectoris: Secondary | ICD-10-CM | POA: Diagnosis not present

## 2018-10-19 NOTE — Telephone Encounter (Signed)
New Message:    Nephew called, he says pt is suffering really bad with arthritidis. Tylenol is not giving her any relief. He wants to know what does Dr Stanford Breed recommend that she take for this pain and it will not interfere with her other medicine and condition?

## 2018-10-19 NOTE — Telephone Encounter (Signed)
Nonsteroidal such as Motrin should help in the short-term.  Would otherwise discuss with primary care. Kirk Ruths

## 2018-10-19 NOTE — Telephone Encounter (Signed)
Spoke with pt nephew, Aware of dr Jacalyn Lefevre recommendations. He reports there is a cream that was suggested by the physical therapist that is prescription that he would like to get her. Referred to medical doctor for that script.

## 2018-10-23 ENCOUNTER — Other Ambulatory Visit: Payer: Self-pay | Admitting: Cardiology

## 2018-10-26 DIAGNOSIS — I10 Essential (primary) hypertension: Secondary | ICD-10-CM | POA: Diagnosis not present

## 2018-10-26 DIAGNOSIS — W19XXXD Unspecified fall, subsequent encounter: Secondary | ICD-10-CM | POA: Diagnosis not present

## 2018-10-26 DIAGNOSIS — Z7982 Long term (current) use of aspirin: Secondary | ICD-10-CM | POA: Diagnosis not present

## 2018-10-26 DIAGNOSIS — I2510 Atherosclerotic heart disease of native coronary artery without angina pectoris: Secondary | ICD-10-CM | POA: Diagnosis not present

## 2018-10-26 NOTE — Telephone Encounter (Signed)
Rx request sent to pharmacy.  

## 2018-10-30 DIAGNOSIS — I10 Essential (primary) hypertension: Secondary | ICD-10-CM | POA: Diagnosis not present

## 2018-10-30 DIAGNOSIS — W19XXXD Unspecified fall, subsequent encounter: Secondary | ICD-10-CM | POA: Diagnosis not present

## 2018-10-30 DIAGNOSIS — I2510 Atherosclerotic heart disease of native coronary artery without angina pectoris: Secondary | ICD-10-CM | POA: Diagnosis not present

## 2018-10-30 DIAGNOSIS — Z7982 Long term (current) use of aspirin: Secondary | ICD-10-CM | POA: Diagnosis not present

## 2018-11-02 DIAGNOSIS — I2510 Atherosclerotic heart disease of native coronary artery without angina pectoris: Secondary | ICD-10-CM | POA: Diagnosis not present

## 2018-11-02 DIAGNOSIS — Z7982 Long term (current) use of aspirin: Secondary | ICD-10-CM | POA: Diagnosis not present

## 2018-11-02 DIAGNOSIS — W19XXXD Unspecified fall, subsequent encounter: Secondary | ICD-10-CM | POA: Diagnosis not present

## 2018-11-02 DIAGNOSIS — I10 Essential (primary) hypertension: Secondary | ICD-10-CM | POA: Diagnosis not present

## 2018-11-17 DIAGNOSIS — I2510 Atherosclerotic heart disease of native coronary artery without angina pectoris: Secondary | ICD-10-CM | POA: Diagnosis not present

## 2018-11-17 DIAGNOSIS — I10 Essential (primary) hypertension: Secondary | ICD-10-CM | POA: Diagnosis not present

## 2018-11-17 DIAGNOSIS — Z7982 Long term (current) use of aspirin: Secondary | ICD-10-CM | POA: Diagnosis not present

## 2018-11-17 DIAGNOSIS — W19XXXD Unspecified fall, subsequent encounter: Secondary | ICD-10-CM | POA: Diagnosis not present

## 2019-04-21 NOTE — Progress Notes (Signed)
HPI: FU CAD and syncope. She has h/o CABG in 2002 and HTN. Patient has had difficulties with recurrent syncope. Last Myoview 10/12: No scar or ischemia, EF 74%. Carotid Dopplers March 2015 showed no significant stenosis. Event monitor 9/13 demonstrated no significant arrhythmias. Patient was seen by Dr. Rayann Heman 10/14 for syncope and her diuretics were discontinued. Patient did have some bradycardia on telemetry that was felt unrelated to her syncopal episodes. She was offered an implantable loop but she declined.Last echocardiogram March 2019 showed normal LV function, moderate mitral regurgitation, severe left atrial enlargement and mild tricuspid regurgitation.Since last seen, she denies dyspnea, chest pain or palpitations.  She had one dizzy spell but no frank syncope.  Current Outpatient Medications  Medication Sig Dispense Refill  . acetaminophen (TYLENOL) 500 MG tablet Take 500 mg by mouth every 6 (six) hours as needed.    Marland Kitchen amLODipine (NORVASC) 5 MG tablet Take 5 mg by mouth at bedtime.    . Ascorbic Acid (VITAMIN C IMMUNE HEALTH PO) Take 1 tablet by mouth daily.    Marland Kitchen aspirin 81 MG tablet Take 81 mg by mouth daily.     Marland Kitchen levothyroxine (SYNTHROID, LEVOTHROID) 50 MCG tablet Take 50 mcg by mouth daily before breakfast.     . Polyethyl Glycol-Propyl Glycol (SYSTANE OP) Place 2 drops into both eyes every morning. For eye dryness.    . pravastatin (PRAVACHOL) 20 MG tablet TAKE 1 TABLET BY MOUTH EVERYDAY AT BEDTIME 90 tablet 1  . triamterene-hydrochlorothiazide (MAXZIDE-25) 37.5-25 MG tablet Take 1 tablet by mouth daily.     No current facility-administered medications for this visit.      Past Medical History:  Diagnosis Date  . Anemia   . Arthritis    osteoarthritis  . Breast cancer ([place]) 1975   Right sided mastectomy  . Coronary artery disease   . H/O hypokalemia   . Hx of CABG    pt states she had 5 bypasses  . Hyperlipidemia   . Hypertension   . Hypothyroidism   . IHD  (ischemic heart disease)    with CABG in August 2002  . Myocardial infarction (HCC)    Non Q-wave MI  . Vitamin D deficiency     Past Surgical History:  Procedure Laterality Date  . BREAST SURGERY     left breast cyst surgery   . CARDIAC CATHETERIZATION  01/20/2001   NORMAL. EF 60-70%  . CARDIOVASCULAR STRESS TEST  Aug 2010   Normal; EF 74%  . CORONARY ARTERY BYPASS GRAFT  12/2000   LEFT INTERNAL MAMMARY TO THE LAD, SAPHENOUS VEIN GRAFT TO THE FIRST DIAGONAL, SAPHENOUS VEIN GRAFT TO THE RAMUS INTERMEDIATE, AND A SEQUENTIAL SAPHENOUS VEIN GRAFT TO THE POSTERIOR DESCENDING AND POSTERIOR LATERAL BRANCHES.  Marland Kitchen MASS EXCISION  04/08/2011   Procedure: EXCISION MASS;  Surgeon: Wynonia Sours, MD;  Location: [place];  Service: Orthopedics;  Laterality: Left;  excision mass left palm  . MASTECTOMY  1975   RIGHT SIDE  . TUBAL LIGATION     1960's    Social History   Socioeconomic History  . Marital status: Widowed    Spouse name: Not on file  . Number of children: Not on file  . Years of education: Not on file  . Highest education level: Not on file  Occupational History  . Not on file  Social Needs  . Financial resource strain: Not on file  . Food insecurity    Worry: Not  on file    Inability: Not on file  . Transportation needs    Medical: Not on file    Non-medical: Not on file  Tobacco Use  . Smoking status: Former Smoker    Packs/day: 1.00    Years: 4.00    Pack years: 4.00    Types: Cigarettes    Quit date: 06/02/1969    Years since quitting: 49.9  . Smokeless tobacco: Never Used  Substance and Sexual Activity  . Alcohol use: No  . Drug use: No  . Sexual activity: Never  Lifestyle  . Physical activity    Days per week: Not on file    Minutes per session: Not on file  . Stress: Not on file  Relationships  . Social Herbalist on phone: Not on file    Gets together: Not on file    Attends religious service: Not on file    Active member of  club or organization: Not on file    Attends meetings of clubs or organizations: Not on file    Relationship status: Not on file  . Intimate partner violence    Fear of current or ex partner: Not on file    Emotionally abused: Not on file    Physically abused: Not on file    Forced sexual activity: Not on file  Other Topics Concern  . Not on file  Social History Narrative  . Not on file    Family History  Problem Relation Age of Onset  . Alzheimer's disease Mother     ROS: no fevers or chills, productive cough, hemoptysis, dysphasia, odynophagia, melena, hematochezia, dysuria, hematuria, rash, seizure activity, orthopnea, PND, pedal edema, claudication. Remaining systems are negative.  Physical Exam: Well-developed well-nourished in no acute distress.  Skin is warm and dry.  HEENT is normal.  Neck is supple.  Chest is clear to auscultation with normal expansion.  Cardiovascular exam is regular rate and rhythm.  Abdominal exam nontender or distended. No masses palpated. Extremities show no edema. neuro grossly intact  ECG-sinus rhythm with first-degree AV block, left ventricular hypertrophy, no ST changes.  Personally reviewed  A/P  1 coronary artery disease-patient has not had recurrent chest pain.  Continue present medications including aspirin and statin.  2 hypertension-blood pressure controlled.  Continue present medications.  Check potassium and renal function.  3 hyperlipidemia-continue statin.  Check lipids and liver.  4 history of syncope-she had a recent dizzy spell but no syncope.  We can consider implantable loop monitor in the future if needed.  Kirk Ruths, MD

## 2019-05-02 ENCOUNTER — Encounter: Payer: Self-pay | Admitting: Cardiology

## 2019-05-02 ENCOUNTER — Other Ambulatory Visit: Payer: Self-pay

## 2019-05-02 ENCOUNTER — Ambulatory Visit (INDEPENDENT_AMBULATORY_CARE_PROVIDER_SITE_OTHER): Payer: Medicare Other | Admitting: Cardiology

## 2019-05-02 VITALS — BP 130/72 | HR 78 | Temp 99.3°F | Ht 63.0 in | Wt 138.0 lb

## 2019-05-02 DIAGNOSIS — R55 Syncope and collapse: Secondary | ICD-10-CM

## 2019-05-02 DIAGNOSIS — E7800 Pure hypercholesterolemia, unspecified: Secondary | ICD-10-CM

## 2019-05-02 DIAGNOSIS — I10 Essential (primary) hypertension: Secondary | ICD-10-CM | POA: Diagnosis not present

## 2019-05-02 DIAGNOSIS — Z23 Encounter for immunization: Secondary | ICD-10-CM | POA: Diagnosis not present

## 2019-05-02 DIAGNOSIS — I2510 Atherosclerotic heart disease of native coronary artery without angina pectoris: Secondary | ICD-10-CM

## 2019-05-02 NOTE — Patient Instructions (Signed)
Medication Instructions:  NO CHANGE *If you need a refill on your cardiac medications before your next appointment, please call your pharmacy*  Lab Work: Your physician recommends that you return for lab work PRIOR TO EATING  If you have labs (blood work) drawn today and your tests are completely normal, you will receive your results only by: Marland Kitchen MyChart Message (if you have MyChart) OR . A paper copy in the mail If you have any lab test that is abnormal or we need to change your treatment, we will call you to review the results.  Follow-Up: At [facility], you and your health needs are our priority.  As part of our continuing mission to provide you with exceptional heart care, we have created designated Provider Care Teams.  These Care Teams include your primary Cardiologist (physician) and Advanced Practice Providers (APPs -  Physician Assistants and Nurse Practitioners) who all work together to provide you with the care you need, when you need it.  Your next appointment:   6 month(s)  The format for your next appointment:   Virtual Visit   Provider:   Kirk Ruths, MD

## 2019-05-06 ENCOUNTER — Other Ambulatory Visit: Payer: Self-pay | Admitting: Cardiology

## 2019-05-06 ENCOUNTER — Telehealth: Payer: Self-pay | Admitting: Cardiology

## 2019-05-06 NOTE — Telephone Encounter (Signed)
New Message     *STAT* If patient is at the pharmacy, call can be transferred to refill team.   1. Which medications need to be refilled? (please list name of each medication and dose if known) pravastatin (PRAVACHOL) 20 MG tablet    2. Which pharmacy/location (including street and city if local pharmacy) is medication to be sent to? [id number] - [place], [place] - [address]  3. Do they need a 30 day or 90 day supply? [place]

## 2019-05-11 ENCOUNTER — Telehealth: Payer: Self-pay | Admitting: Cardiology

## 2019-05-11 NOTE — Telephone Encounter (Signed)
Patient is calling wanting to know if it would be okay to wait to take her labs until it get warmer outside due to her being told not to come out in the cold if possible because of her Heart bypass surgery. Please Advise.

## 2019-05-12 NOTE — Telephone Encounter (Signed)
Pt aware to come in at her convenience just make sure is fasting ,.cy

## 2019-07-31 DIAGNOSIS — R42 Dizziness and giddiness: Secondary | ICD-10-CM | POA: Diagnosis not present

## 2019-07-31 DIAGNOSIS — R001 Bradycardia, unspecified: Secondary | ICD-10-CM | POA: Diagnosis not present

## 2019-07-31 DIAGNOSIS — I10 Essential (primary) hypertension: Secondary | ICD-10-CM | POA: Diagnosis not present

## 2019-08-01 ENCOUNTER — Telehealth: Payer: Self-pay | Admitting: Cardiology

## 2019-08-01 NOTE — Telephone Encounter (Signed)
New Message:     Pt said she had an episode of dizziness yesterday and called EMS. EMS told her to call this morning and see if she could see Dr Stanford Breed today.

## 2019-08-01 NOTE — Progress Notes (Signed)
Cardiology Office Note:    Date:  08/03/2019   ID:  Natalie Bullock, DOB 10/27/1924, MRN [id number]  PCP:  Seward Carol, MD  Cardiologist:  Kirk Ruths, MD  Electrophysiologist:  None   Referring MD: Seward Carol, MD   Chief Complaint: follow up for recent episode of dizziness with standing  History of Present Illness:    Natalie Bullock is a 86 y.o. female with a history of CAD s/p remote CABG in 2002, syncope, hypertension, hyperlipidemia, hypothyroidism, breast cancer s/p right mastectomy in the 1970s, and anemia who is followed by Dr. Stanford Breed and presents today for further evaluation of dizziness and hypertension.   Patient has a history of CAD with remote CABG x5 in 2002. Most recent Myoview in 03/2011 showed no evidence of scar or ischemia and EF of 74%. Patient has had difficulties with recurrent syncope. Carotid dopplers in 08/2011 showed no significant stenosis. Event monitor in 02/2012 showed no significant arrhythmias. Patient was seen by Dr. Rayann Heman in 03/2013 during an admission for syncope and her diuretics were discontinued. Patient did have some bradycardia noted on telemetry but this was felt to be unrelated to her syncopal episodes She was offered and implantable loop recorder but declined. Last Echo in 07/2017 showed LVEF of 55-60% with normal wall motion, moderate MR, mild TR, and severe left atrial enlargement. She was last seen by Dr. Stanford Breed in 04/2019 at which time she was doing well from a cardiac standpoint. She did report one episode of dizziness but no syncope.   Patient called our office on 08/01/2019 to report an episode of dizziness after standing up the day before. No syncope. She called EMS and BP was reportedly 190/100 while standing and 166/80 while sitting. Symptoms resolved on there own so she was not taken to the ED. Personally reviewed EKG strip from EMS (will scan into system. Did not show any acute ST/T changes compared to prior tracings.   Patient presents  today for follow-up. Here with [name], who is patient's healthcare power of attorney.  We talked about the above incident.  Patient states she was sitting down and went to stand up quickly to go use the restroom she swayed to the left and ended to the right.  Given her history of syncope this made her very nervous so she sat down and pushed her life alert button, and called her. Unclear whether she had true dizziness or just felt unsteady on her feet.  No falls or syncope.  No palpitations, chest pain, shortness of breath.  She notes some chronic lower extremity swelling but denies any worsening numbness.  No orthopnea or PND. Patients biggest complaint really seems to be her severe arthritis in her knees and shoulders.   Past Medical History:  Diagnosis Date  . Anemia   . Arthritis    osteoarthritis  . Breast cancer ([place]) 1975   Right sided mastectomy  . Coronary artery disease   . H/O hypokalemia   . Hx of CABG    pt states she had 5 bypasses  . Hyperlipidemia   . Hypertension   . Hypothyroidism   . IHD (ischemic heart disease)    with CABG in August 2002  . Myocardial infarction ([facility]CC)    Non Q-wave MI  . Vitamin D deficiency     Past Surgical History:  Procedure Laterality Date  . BREAST SURGERY     left breast cyst surgery   . CARDIAC CATHETERIZATION  01/20/2001   NORMAL. EF  60-70%  . CARDIOVASCULAR STRESS TEST  Aug 2010   Normal; EF 74%  . CORONARY ARTERY BYPASS GRAFT  12/2000   LEFT INTERNAL MAMMARY TO THE LAD, SAPHENOUS VEIN GRAFT TO THE FIRST DIAGONAL, SAPHENOUS VEIN GRAFT TO THE RAMUS INTERMEDIATE, AND A SEQUENTIAL SAPHENOUS VEIN GRAFT TO THE POSTERIOR DESCENDING AND POSTERIOR LATERAL BRANCHES.  Marland Kitchen MASS EXCISION  04/08/2011   Procedure: EXCISION MASS;  Surgeon: Wynonia Sours, MD;  Location: [facility];  Service: Orthopedics;  Laterality: Left;  excision mass left palm  . MASTECTOMY  1975   RIGHT SIDE  . TUBAL LIGATION     1960's    Current  Medications: Current Meds  Medication Sig  . acetaminophen (TYLENOL) 500 MG tablet Take 500 mg by mouth every 6 (six) hours as needed.  Marland Kitchen amLODipine (NORVASC) 5 MG tablet Take 5 mg by mouth at bedtime.  . Ascorbic Acid (VITAMIN C IMMUNE HEALTH PO) Take 1 tablet by mouth daily.  Marland Kitchen aspirin 81 MG tablet Take 81 mg by mouth daily.   Marland Kitchen levothyroxine (SYNTHROID, LEVOTHROID) 50 MCG tablet Take 50 mcg by mouth daily before breakfast.   . Polyethyl Glycol-Propyl Glycol (SYSTANE OP) Place 2 drops into both eyes every morning. For eye dryness.  . pravastatin (PRAVACHOL) 20 MG tablet TAKE 1 TABLET BY MOUTH EVERYDAY AT BEDTIME  . triamterene-hydrochlorothiazide (MAXZIDE-25) 37.5-25 MG tablet Take 1 tablet by mouth daily.     Allergies:   Valium, Codeine, Crestor [rosuvastatin calcium], Doxycycline, Ezetimibe-simvastatin, Lipitor [atorvastatin calcium], Sulfa drugs cross reactors, Tequin, and Zocor [simvastatin]   Social History   Socioeconomic History  . Marital status: Widowed    Spouse name: Not on file  . Number of children: Not on file  . Years of education: Not on file  . Highest education level: Not on file  Occupational History  . Not on file  Tobacco Use  . Smoking status: Former Smoker    Packs/day: 1.00    Years: 4.00    Pack years: 4.00    Types: Cigarettes    Quit date: 06/02/1969    Years since quitting: 50.2  . Smokeless tobacco: Never Used  Substance and Sexual Activity  . Alcohol use: No  . Drug use: No  . Sexual activity: Never  Other Topics Concern  . Not on file  Social History Narrative  . Not on file   Social Determinants of Health   Financial Resource Strain:   . Difficulty of Paying Living Expenses: Not on file  Food Insecurity:   . Worried About Charity fundraiser in the Last Year: Not on file  . Ran Out of Food in the Last Year: Not on file  Transportation Needs:   . Lack of Transportation (Medical): Not on file  . Lack of Transportation (Non-Medical):  Not on file  Physical Activity:   . Days of Exercise per Week: Not on file  . Minutes of Exercise per Session: Not on file  Stress:   . Feeling of Stress : Not on file  Social Connections:   . Frequency of Communication with Friends and Family: Not on file  . Frequency of Social Gatherings with Friends and Family: Not on file  . Attends Religious Services: Not on file  . Active Member of Clubs or Organizations: Not on file  . Attends Archivist Meetings: Not on file  . Marital Status: Not on file     Family History: The patient's family history includes Alzheimer's disease  in her mother.  ROS:   Please see the history of present illness.     EKGs/Labs/Other Studies Reviewed:    The following studies were reviewed today:  Carotid Dopplers 08/15/2011: No significant extracranial carotid artery stenosis  demonstrated. Vertebrals are patent with antegrade flow.  _______________  Echocardiogram 08/16/2017: Study Conclusions: - Left ventricle: The cavity size was normal. There was mild  concentric hypertrophy. Systolic function was normal. The  estimated ejection fraction was in the range of 55% to 60%. Wall  motion was normal; there were no regional wall motion  abnormalities.  - Aortic valve: Transvalvular velocity was within the normal range.  There was no stenosis. There was no regurgitation.  - Mitral valve: Transvalvular velocity was within the normal range.  There was no evidence for stenosis. There was moderate  regurgitation.  - Left atrium: The atrium was severely dilated.  - Right ventricle: The cavity size was normal. Wall thickness was  normal. Systolic function was normal.  - Right atrium: The atrium was normal in size.  - Atrial septum: No defect or patent foramen ovale was identified  by color flow Doppler.  - Tricuspid valve: There was mild regurgitation.  - Pulmonary arteries: Systolic pressure was within the normal  range. PA  peak pressure: 25 mm Hg (S).   EKG:  EKG ordered today. EKG personally reviewed and demonstrates normal sinus rhythm/sinus arrhythmia, rate 76 bpm, with baseline wandering, borderline 1st degree AV block, LVH, and non-specific ST/T changes. No significant changes compared to prior tracings.   Recent Labs: No results found for requested labs within last 8760 hours.  Recent Lipid Panel    Component Value Date/Time   CHOL 125 03/26/2013 1425   TRIG 57 03/26/2013 1425   HDL 56 03/26/2013 1425   CHOLHDL 2.2 03/26/2013 1425   VLDL 11 03/26/2013 1425   LDLCALC 58 03/26/2013 1425    Physical Exam:    Vital Signs: BP (!) 142/75   Pulse 67   Ht 5\' 4"  (1.626 m)   Wt 143 lb 9.6 oz (65.1 kg)   BMI 24.65 kg/m     Wt Readings from Last 3 Encounters:  08/03/19 143 lb 9.6 oz (65.1 kg)  05/02/19 138 lb (62.6 kg)  01/12/18 132 lb 6.4 oz (60.1 kg)     General: 86 y.o. female in no acute distress. HEENT: Normocephalic and atraumatic.  Neck: Supple. No carotid bruits.  Heart: RRR. Distinct S1 and S2. II/VI murmur noted at upper sternal border. No gallops or rubs. Radial pulses 2+ and equal bilaterally. Lungs: No increased work of breathing. Clear to ausculation bilaterally. No wheezes, rhonchi, or rales.  Abdomen: Soft, non-distended, and non-tender to palpation.  MSK: Generalized weakness. Ambulates with a walker.  Extremities: Mild (trace to 1+) pitting edema of bilateral lower extremities. Skin: Warm and dry. Neuro: Alert and oriented x3. No focal deficits. Psych: Normal affect. Responds appropriately.   Assessment:    1. History of syncope   2. Unsteadiness on feet   3. Coronary artery disease involving native coronary artery of native heart without angina pectoris   4. Mitral valve insufficiency, unspecified etiology   5. Essential hypertension   6. Hyperlipidemia, unspecified hyperlipidemia type   7. Anemia, unspecified type   8. Arthritis     Plan:     Unsteadiness/Questionable Dizziness with History of Syncope - Patient recently had an episode of unsteadiness/dizziness after standing quickly but no syncope. BP was elevated at the time.  - EKG showed  no acute ischemic changes.  - Patient unable to get up on the table to lay flat but were able to get BP sitting and standing. BP 142/75 and heart rate 67 with sitting and BP 154/86 and heart rate 83 with standing.  - Patient really describes more unsteadiness rather than true dizziness with standing. Could also be some orthostasis (although interestingly BP higher with standing than with sitting). - Advised patient to change position slowly and make sure she has her walker with her.  - Do not think any additional work-up is needed at this time.    CAD s/p CABG - History of CABG in 2002.  - EKG shows no acute changes.  - Stable. No angina.  - Continue aspirin and statin.  Moderate Mitral Regurgitation - Last Echo in 07/2017 showed moderate MR.  - Given age and lack of symptoms, will not order repeat Echo at this time.   Hypertension - BP 142/75 with sitting and 154/86 with standing. I am OK with this given patient's age and frailty.  - Continue currently medications: Amlodipine 5mg  daily and Triamterene-HCTZ 37.5-25mg  daily.  - Will recheck potassium and renal function on CMET when patient comes in for fasting labs.   Hyperlipidemia - Most recent lipid panel from 04/2018: Total Cholesterol 123, Triglycerides 130, HDL 50, LDL 47.  - At LDL goal of <70 given CAD. - Continue Pravastatin 20mg  daily.  - Lab already closed but will have patient come back for lipid panel and CMET at a time that is convenient for her.   Anemia - Patient has a history of anemia.  - Hgb 11.0 on last check in 09/2017. - Will recheck CBC when she comes in for fasting labs.   Arthritis - Nephew asked if it is OK if she uses Voltaren gel. Informed him that this is OK from a cardiac standpoint if needed. However,  would avoid oral NSAIDs.  - Advised following up with PCP for more recommendations for managing arthritic pain.   Disposition: Follow up in 4-6 months with Dr. Stanford Breed.   Medication Adjustments/Labs and Tests Ordered: Current medicines are reviewed at length with the patient today.  Concerns regarding medicines are outlined above.  Orders Placed This Encounter  Procedures  . CBC  . EKG 12-Lead   No orders of the defined types were placed in this encounter.   Patient Instructions  Medication Instructions:  The current medical regimen is effective;  continue present plan and medications.  *If you need a refill on your cardiac medications before your next appointment, please call your pharmacy*   Lab Work: CBC, LIPID, and CMET *fasting*  If you have labs (blood work) drawn today and your tests are completely normal, you will receive your results only by: Marland Kitchen MyChart Message (if you have MyChart) OR . A paper copy in the mail If you have any lab test that is abnormal or we need to change your treatment, we will call you to review the results.  Follow-Up: At [facility], you and your health needs are our priority.  As part of our continuing mission to provide you with exceptional heart care, we have created designated Provider Care Teams.  These Care Teams include your primary Cardiologist (physician) and Advanced Practice Providers (APPs -  Physician Assistants and Nurse Practitioners) who all work together to provide you with the care you need, when you need it.  We recommend signing up for the patient portal called "MyChart".  Sign up information is provided  on this After Visit Summary.  MyChart is used to connect with patients for Virtual Visits (Telemedicine).  Patients are able to view lab/test results, encounter notes, upcoming appointments, etc.  Non-urgent messages can be sent to your provider as well.   To learn more about what you can do with MyChart, go to  [email or web address].    Your next appointment:   4 month(s)  The format for your next appointment:   In Person  Provider:   You may see Kirk Ruths, MD or one of the following Advanced Practice Providers on your designated Care Team:    Kerin Ransom, PA-C  [place], [place]  Coletta Memos, FNP       Signed, Darreld Mclean, [place]  08/03/2019 5:43 PM    [employer]

## 2019-08-01 NOTE — Telephone Encounter (Signed)
Spoke with pt, she was watching tv, and when she got up to go to the bathroom. She felt dizzy and sat back down. EMS came out and they gave her paperwork for her to give to [facility]. Her bp was slightly elevated,190/100 standing 166/80 sitting. She felt better after EMS left. She feels okay today but wants to be seen this week. Follow up scheduled

## 2019-08-03 ENCOUNTER — Ambulatory Visit (INDEPENDENT_AMBULATORY_CARE_PROVIDER_SITE_OTHER): Payer: Medicare Other | Admitting: Student

## 2019-08-03 ENCOUNTER — Encounter: Payer: Self-pay | Admitting: Student

## 2019-08-03 VITALS — BP 142/75 | HR 67 | Ht 64.0 in | Wt 143.6 lb

## 2019-08-03 DIAGNOSIS — D649 Anemia, unspecified: Secondary | ICD-10-CM | POA: Diagnosis not present

## 2019-08-03 DIAGNOSIS — I340 Nonrheumatic mitral (valve) insufficiency: Secondary | ICD-10-CM | POA: Diagnosis not present

## 2019-08-03 DIAGNOSIS — E785 Hyperlipidemia, unspecified: Secondary | ICD-10-CM

## 2019-08-03 DIAGNOSIS — I2510 Atherosclerotic heart disease of native coronary artery without angina pectoris: Secondary | ICD-10-CM | POA: Diagnosis not present

## 2019-08-03 DIAGNOSIS — I10 Essential (primary) hypertension: Secondary | ICD-10-CM | POA: Diagnosis not present

## 2019-08-03 DIAGNOSIS — M1990 Unspecified osteoarthritis, unspecified site: Secondary | ICD-10-CM

## 2019-08-03 DIAGNOSIS — R2681 Unsteadiness on feet: Secondary | ICD-10-CM

## 2019-08-03 DIAGNOSIS — Z87898 Personal history of other specified conditions: Secondary | ICD-10-CM | POA: Diagnosis not present

## 2019-08-03 NOTE — Patient Instructions (Signed)
Medication Instructions:  The current medical regimen is effective;  continue present plan and medications.  *If you need a refill on your cardiac medications before your next appointment, please call your pharmacy*   Lab Work: CBC, LIPID, and CMET *fasting*  If you have labs (blood work) drawn today and your tests are completely normal, you will receive your results only by: Marland Kitchen MyChart Message (if you have MyChart) OR . A paper copy in the mail If you have any lab test that is abnormal or we need to change your treatment, we will call you to review the results.  Follow-Up: At [facility], you and your health needs are our priority.  As part of our continuing mission to provide you with exceptional heart care, we have created designated Provider Care Teams.  These Care Teams include your primary Cardiologist (physician) and Advanced Practice Providers (APPs -  Physician Assistants and Nurse Practitioners) who all work together to provide you with the care you need, when you need it.  We recommend signing up for the patient portal called "MyChart".  Sign up information is provided on this After Visit Summary.  MyChart is used to connect with patients for Virtual Visits (Telemedicine).  Patients are able to view lab/test results, encounter notes, upcoming appointments, etc.  Non-urgent messages can be sent to your provider as well.   To learn more about what you can do with MyChart, go to [email or web address].    Your next appointment:   4 month(s)  The format for your next appointment:   In Person  Provider:   You may see Kirk Ruths, MD or one of the following Advanced Practice Providers on your designated Care Team:    Kerin Ransom, PA-C  [place], [place]  Coletta Memos, [place]

## 2019-08-07 DIAGNOSIS — Z23 Encounter for immunization: Secondary | ICD-10-CM | POA: Diagnosis not present

## 2019-08-23 ENCOUNTER — Encounter: Payer: Self-pay | Admitting: Student

## 2019-08-26 ENCOUNTER — Telehealth: Payer: Self-pay | Admitting: Cardiology

## 2019-08-26 NOTE — Telephone Encounter (Signed)
Advised patient, verbalized understanding  

## 2019-08-26 NOTE — Telephone Encounter (Signed)
Patient calling stating she was told by Dr. Stanford Breed to stop taking motrin for her arthritis. She would like to know what she can take instead for the pain.

## 2019-08-26 NOTE — Telephone Encounter (Signed)
Pt state she was instructed by MD at last OV to D/C using motrin for arthritis as it would create problems in the long run. Pt report she was using warm compress to alleviate pain, but its no longer working. Pt asking what does MD recommend she take for pain.

## 2019-08-26 NOTE — Telephone Encounter (Signed)
Would try tylenol Kirk Ruths

## 2019-09-04 DIAGNOSIS — Z23 Encounter for immunization: Secondary | ICD-10-CM | POA: Diagnosis not present

## 2019-11-02 ENCOUNTER — Other Ambulatory Visit: Payer: Self-pay

## 2019-11-02 ENCOUNTER — Ambulatory Visit (INDEPENDENT_AMBULATORY_CARE_PROVIDER_SITE_OTHER): Payer: Medicare Other | Admitting: Cardiology

## 2019-11-02 ENCOUNTER — Encounter: Payer: Self-pay | Admitting: Cardiology

## 2019-11-02 VITALS — BP 125/70 | HR 71 | Temp 97.3°F | Ht 63.0 in | Wt 142.4 lb

## 2019-11-02 DIAGNOSIS — I10 Essential (primary) hypertension: Secondary | ICD-10-CM

## 2019-11-02 DIAGNOSIS — I2510 Atherosclerotic heart disease of native coronary artery without angina pectoris: Secondary | ICD-10-CM

## 2019-11-02 DIAGNOSIS — Z951 Presence of aortocoronary bypass graft: Secondary | ICD-10-CM | POA: Diagnosis not present

## 2019-11-02 DIAGNOSIS — I340 Nonrheumatic mitral (valve) insufficiency: Secondary | ICD-10-CM | POA: Diagnosis not present

## 2019-11-02 DIAGNOSIS — E7800 Pure hypercholesterolemia, unspecified: Secondary | ICD-10-CM | POA: Diagnosis not present

## 2019-11-02 NOTE — Assessment & Plan Note (Signed)
S/P CABG in August of 2002 with LIMA to LAD, SVG to 1st DX, SVG to intermediate and SVG to PD and PL Last stress test in 2012- medical Rx

## 2019-11-02 NOTE — Patient Instructions (Signed)
Medication Instructions:  No change *If you need a refill on your cardiac medications before your next appointment, please call your pharmacy*   Lab Work: Not needed    Testing/Procedures: Not needed   Follow-Up: At [facility], you and your health needs are our priority.  As part of our continuing mission to provide you with exceptional heart care, we have created designated Provider Care Teams.  These Care Teams include your primary Cardiologist (physician) and Advanced Practice Providers (APPs -  Physician Assistants and Nurse Practitioners) who all work together to provide you with the care you need, when you need it.  We recommend signing up for the patient portal called "MyChart".  Sign up information is provided on this After Visit Summary.  MyChart is used to connect with patients for Virtual Visits (Telemedicine).  Patients are able to view lab/test results, encounter notes, upcoming appointments, etc.  Non-urgent messages can be sent to your provider as well.   To learn more about what you can do with MyChart, go to [email or web address].    Your next appointment:   6 month(s)  The format for your next appointment:   In Person  Provider:   Kirk Ruths, MD

## 2019-11-02 NOTE — Progress Notes (Signed)
Cardiology Office Note:    Date:  11/02/2019   ID:  Natalie Bullock, DOB [birth year], MRN [id number]  PCP:  Seward Carol, MD  Cardiologist:  Kirk Ruths, MD  Electrophysiologist:  None   Referring MD: Seward Carol, MD   No chief complaint on file.   History of Present Illness:    Natalie Bullock is a 86 y.o. female with a hx of CABG in 2002.  She lives in her own home, her nephews check on her.  Her last functional study was an echo in march 2019 that showed normal LVF, moderate MR, and severe LAE.  She is in the office today for follow up.  Since we saw her last (March 2021) she has done well.  No chest pain, no issues with her medications, no chest pain.  Past Medical History:  Diagnosis Date  . Anemia   . Arthritis    osteoarthritis  . Breast cancer ([place]) 1975   Right sided mastectomy  . Coronary artery disease   . H/O hypokalemia   . Hx of CABG    pt states she had 5 bypasses  . Hyperlipidemia   . Hypertension   . Hypothyroidism   . IHD (ischemic heart disease)    with CABG in August 2002  . Myocardial infarction (HCC)    Non Q-wave MI  . Vitamin D deficiency     Past Surgical History:  Procedure Laterality Date  . BREAST SURGERY     left breast cyst surgery   . CARDIAC CATHETERIZATION  01/20/2001   NORMAL. EF 60-70%  . CARDIOVASCULAR STRESS TEST  Aug 2010   Normal; EF 74%  . CORONARY ARTERY BYPASS GRAFT  12/2000   LEFT INTERNAL MAMMARY TO THE LAD, SAPHENOUS VEIN GRAFT TO THE FIRST DIAGONAL, SAPHENOUS VEIN GRAFT TO THE RAMUS INTERMEDIATE, AND A SEQUENTIAL SAPHENOUS VEIN GRAFT TO THE POSTERIOR DESCENDING AND POSTERIOR LATERAL BRANCHES.  Marland Kitchen MASS EXCISION  04/08/2011   Procedure: EXCISION MASS;  Surgeon: Wynonia Sours, MD;  Location: [facility];  Service: Orthopedics;  Laterality: Left;  excision mass left palm  . MASTECTOMY  1975   RIGHT SIDE  . TUBAL LIGATION     1960's    Current Medications: Current Meds  Medication Sig  . amLODipine  (NORVASC) 5 MG tablet Take 5 mg by mouth at bedtime.  . Ascorbic Acid (VITAMIN C IMMUNE HEALTH PO) Take 1 tablet by mouth daily.  Marland Kitchen aspirin 81 MG tablet Take 81 mg by mouth daily.   Marland Kitchen levothyroxine (SYNTHROID, LEVOTHROID) 50 MCG tablet Take 50 mcg by mouth daily before breakfast.   . Polyethyl Glycol-Propyl Glycol (SYSTANE OP) Place 2 drops into both eyes every morning. For eye dryness.  . pravastatin (PRAVACHOL) 20 MG tablet TAKE 1 TABLET BY MOUTH EVERYDAY AT BEDTIME  . triamterene-hydrochlorothiazide (MAXZIDE-25) 37.5-25 MG tablet Take 1 tablet by mouth daily.  . [DISCONTINUED] acetaminophen (TYLENOL) 500 MG tablet Take 500 mg by mouth every 6 (six) hours as needed.     Allergies:   Valium, Codeine, Crestor [rosuvastatin calcium], Doxycycline, Ezetimibe-simvastatin, Lipitor [atorvastatin calcium], Sulfa drugs cross reactors, Tequin, and Zocor [simvastatin]   Social History   Socioeconomic History  . Marital status: Widowed    Spouse name: Not on file  . Number of children: Not on file  . Years of education: Not on file  . Highest education level: Not on file  Occupational History  . Not on file  Tobacco Use  . Smoking status:  Former Smoker    Packs/day: 1.00    Years: 4.00    Pack years: 4.00    Types: Cigarettes    Quit date: 06/02/1969    Years since quitting: 50.4  . Smokeless tobacco: Never Used  Substance and Sexual Activity  . Alcohol use: No  . Drug use: No  . Sexual activity: Never  Other Topics Concern  . Not on file  Social History Narrative  . Not on file   Social Determinants of Health   Financial Resource Strain:   . Difficulty of Paying Living Expenses:   Food Insecurity:   . Worried About Charity fundraiser in the Last Year:   . Arboriculturist in the Last Year:   Transportation Needs:   . Film/video editor (Medical):   Marland Kitchen Lack of Transportation (Non-Medical):   Physical Activity:   . Days of Exercise per Week:   . Minutes of Exercise per  Session:   Stress:   . Feeling of Stress :   Social Connections:   . Frequency of Communication with Friends and Family:   . Frequency of Social Gatherings with Friends and Family:   . Attends Religious Services:   . Active Member of Clubs or Organizations:   . Attends Archivist Meetings:   Marland Kitchen Marital Status:      Family History: The patient's family history includes Alzheimer's disease in her mother.  ROS:   Please see the history of present illness.     All other systems reviewed and are negative.  EKGs/Labs/Other Studies Reviewed:    The following studies were reviewed today: Echo march 2019  EKG:  EKG is ordered today.  The ekg ordered 08/03/2019 demonstrates NSR- HR 76- poor anterior RW, poor tracing secondary to artifact.   Recent Labs: No results found for requested labs within last 8760 hours.  Recent Lipid Panel    Component Value Date/Time   CHOL 125 03/26/2013 1425   TRIG 57 03/26/2013 1425   HDL 56 03/26/2013 1425   CHOLHDL 2.2 03/26/2013 1425   VLDL 11 03/26/2013 1425   LDLCALC 58 03/26/2013 1425    Physical Exam:    VS:  BP 125/70   Pulse 71   Temp (!) 97.3 F (36.3 C)   Ht 5\' 3"  (1.6 m)   Wt 142 lb 6.4 oz (64.6 kg)   SpO2 97%   BMI 25.23 kg/m     Wt Readings from Last 3 Encounters:  11/02/19 142 lb 6.4 oz (64.6 kg)  08/03/19 143 lb 9.6 oz (65.1 kg)  05/02/19 138 lb (62.6 kg)     GEN:  Elderly thin AA female,  well developed in no acute distress HEENT: Normal NECK: No JVD;  LYMPHATICS: No lymphadenopathy CARDIAC: RRR, no murmurs, rubs, gallops RESPIRATORY:  Clear to auscultation without rales, wheezing or rhonchi  ABDOMEN: Soft, non-tender, non-distended MUSCULOSKELETAL:  No edema; No deformity  SKIN: Warm and dry NEUROLOGIC:  Alert and oriented x 3 PSYCHIATRIC:  Normal affect   ASSESSMENT:    Hx of CABG S/P CABG in August of 2002 with LIMA to LAD, SVG to 1st DX, SVG to intermediate and SVG to PD and PL Last stress test  in 2012- medical Rx  Hyperlipidemia On Pravachol Intolerant to Lipitor, Zocor and Crestor  HTN (hypertension) Controlled  Mitral regurgitation Normal LVF- moderate MR, severe LAE March 2019 echo  PLAN:    No significant MR on exam- no change in Rx- f/u in  6 months with Dr Stanford Breed.   Medication Adjustments/Labs and Tests Ordered: Current medicines are reviewed at length with the patient today.  Concerns regarding medicines are outlined above.  No orders of the defined types were placed in this encounter.  No orders of the defined types were placed in this encounter.   There are no Patient Instructions on file for this visit.   Signed, Kerin Ransom, PA-C  11/02/2019 3:30 PM    [facility]

## 2019-11-02 NOTE — Assessment & Plan Note (Signed)
Controlled.  

## 2019-11-02 NOTE — Assessment & Plan Note (Signed)
On Pravachol Intolerant to Lipitor, Zocor and Crestor

## 2019-11-02 NOTE — Assessment & Plan Note (Signed)
Normal LVF- moderate MR, severe LAE March 2019 echo

## 2020-01-23 DIAGNOSIS — E039 Hypothyroidism, unspecified: Secondary | ICD-10-CM | POA: Diagnosis not present

## 2020-01-23 DIAGNOSIS — E7800 Pure hypercholesterolemia, unspecified: Secondary | ICD-10-CM | POA: Diagnosis not present

## 2020-01-23 DIAGNOSIS — E8779 Other fluid overload: Secondary | ICD-10-CM | POA: Diagnosis not present

## 2020-01-23 DIAGNOSIS — I25810 Atherosclerosis of coronary artery bypass graft(s) without angina pectoris: Secondary | ICD-10-CM | POA: Diagnosis not present

## 2020-01-23 DIAGNOSIS — R600 Localized edema: Secondary | ICD-10-CM | POA: Diagnosis not present

## 2020-01-23 DIAGNOSIS — I10 Essential (primary) hypertension: Secondary | ICD-10-CM | POA: Diagnosis not present

## 2020-05-02 NOTE — Progress Notes (Deleted)
HPI: FU CAD and syncope. She has h/o CABG in 2002 and HTN. Patient has had difficulties with recurrent syncope. Last Myoview 10/12: No scar or ischemia, EF 74%. Carotid Dopplers March 2015 showed no significant stenosis. Event monitor 9/13 demonstrated no significant arrhythmias. Patient was seen by Dr. Rayann Heman 10/14 for syncope and her diuretics were discontinued. Patient did have some bradycardia on telemetry that was felt unrelated to her syncopal episodes. She was offered an implantable loop but she declined.Last echocardiogram March 2019 showed normal LV function, moderate mitral regurgitation, severe left atrial enlargement and mild tricuspid regurgitation.Since last seen,   Current Outpatient Medications  Medication Sig Dispense Refill  . amLODipine (NORVASC) 5 MG tablet Take 5 mg by mouth at bedtime.    . Ascorbic Acid (VITAMIN C IMMUNE HEALTH PO) Take 1 tablet by mouth daily.    Marland Kitchen aspirin 81 MG tablet Take 81 mg by mouth daily.     Marland Kitchen levothyroxine (SYNTHROID, LEVOTHROID) 50 MCG tablet Take 50 mcg by mouth daily before breakfast.     . Polyethyl Glycol-Propyl Glycol (SYSTANE OP) Place 2 drops into both eyes every morning. For eye dryness.    . pravastatin (PRAVACHOL) 20 MG tablet TAKE 1 TABLET BY MOUTH EVERYDAY AT BEDTIME 90 tablet 3  . triamterene-hydrochlorothiazide (MAXZIDE-25) 37.5-25 MG tablet Take 1 tablet by mouth daily.     No current facility-administered medications for this visit.     Past Medical History:  Diagnosis Date  . Anemia   . Arthritis    osteoarthritis  . Breast cancer ([place]) 1975   Right sided mastectomy  . Coronary artery disease   . H/O hypokalemia   . Hx of CABG    pt states she had 5 bypasses  . Hyperlipidemia   . Hypertension   . Hypothyroidism   . IHD (ischemic heart disease)    with CABG in August 2002  . Myocardial infarction (HCC)    Non Q-wave MI  . Vitamin D deficiency     Past Surgical History:  Procedure Laterality Date  .  BREAST SURGERY     left breast cyst surgery   . CARDIAC CATHETERIZATION  01/20/2001   NORMAL. EF 60-70%  . CARDIOVASCULAR STRESS TEST  Aug 2010   Normal; EF 74%  . CORONARY ARTERY BYPASS GRAFT  12/2000   LEFT INTERNAL MAMMARY TO THE LAD, SAPHENOUS VEIN GRAFT TO THE FIRST DIAGONAL, SAPHENOUS VEIN GRAFT TO THE RAMUS INTERMEDIATE, AND A SEQUENTIAL SAPHENOUS VEIN GRAFT TO THE POSTERIOR DESCENDING AND POSTERIOR LATERAL BRANCHES.  Marland Kitchen MASS EXCISION  04/08/2011   Procedure: EXCISION MASS;  Surgeon: Wynonia Sours, MD;  Location: [facility];  Service: Orthopedics;  Laterality: Left;  excision mass left palm  . MASTECTOMY  1975   RIGHT SIDE  . TUBAL LIGATION     1960's    Social History   Socioeconomic History  . Marital status: Widowed    Spouse name: Not on file  . Number of children: Not on file  . Years of education: Not on file  . Highest education level: Not on file  Occupational History  . Not on file  Tobacco Use  . Smoking status: Former Smoker    Packs/day: 1.00    Years: 4.00    Pack years: 4.00    Types: Cigarettes    Quit date: 06/02/1969    Years since quitting: 50.9  . Smokeless tobacco: Never Used  Substance and Sexual Activity  . Alcohol  use: No  . Drug use: No  . Sexual activity: Never  Other Topics Concern  . Not on file  Social History Narrative  . Not on file   Social Determinants of Health   Financial Resource Strain:   . Difficulty of Paying Living Expenses: Not on file  Food Insecurity:   . Worried About Charity fundraiser in the Last Year: Not on file  . Ran Out of Food in the Last Year: Not on file  Transportation Needs:   . Lack of Transportation (Medical): Not on file  . Lack of Transportation (Non-Medical): Not on file  Physical Activity:   . Days of Exercise per Week: Not on file  . Minutes of Exercise per Session: Not on file  Stress:   . Feeling of Stress : Not on file  Social Connections:   . Frequency of Communication with  Friends and Family: Not on file  . Frequency of Social Gatherings with Friends and Family: Not on file  . Attends Religious Services: Not on file  . Active Member of Clubs or Organizations: Not on file  . Attends Archivist Meetings: Not on file  . Marital Status: Not on file  Intimate Partner Violence:   . Fear of Current or Ex-Partner: Not on file  . Emotionally Abused: Not on file  . Physically Abused: Not on file  . Sexually Abused: Not on file    Family History  Problem Relation Age of Onset  . Alzheimer's disease Mother     ROS: no fevers or chills, productive cough, hemoptysis, dysphasia, odynophagia, melena, hematochezia, dysuria, hematuria, rash, seizure activity, orthopnea, PND, pedal edema, claudication. Remaining systems are negative.  Physical Exam: Well-developed well-nourished in no acute distress.  Skin is warm and dry.  HEENT is normal.  Neck is supple.  Chest is clear to auscultation with normal expansion.  Cardiovascular exam is regular rate and rhythm.  Abdominal exam nontender or distended. No masses palpated. Extremities show no edema. neuro grossly intact  ECG- personally reviewed  A/P  1 coronary artery disease-patient denies chest pain.  Continue aspirin and statin.  2 hypertension-patient's blood pressure is controlled.  Continue present medical regimen.  3 hyperlipidemia-continue pravastatin.  Note intolerant to Crestor, Lipitor and Zocor.  4 history of syncope-  5 history of moderate mitral regurgitation-we will repeat echocardiogram.  Kirk Ruths, MD

## 2020-05-11 ENCOUNTER — Other Ambulatory Visit: Payer: Self-pay | Admitting: Cardiology

## 2020-05-14 ENCOUNTER — Ambulatory Visit: Payer: Medicare Other | Admitting: Cardiology

## 2020-07-23 DIAGNOSIS — E039 Hypothyroidism, unspecified: Secondary | ICD-10-CM | POA: Diagnosis not present

## 2020-07-23 DIAGNOSIS — E7800 Pure hypercholesterolemia, unspecified: Secondary | ICD-10-CM | POA: Diagnosis not present

## 2020-07-23 DIAGNOSIS — Z23 Encounter for immunization: Secondary | ICD-10-CM | POA: Diagnosis not present

## 2020-07-23 DIAGNOSIS — M7989 Other specified soft tissue disorders: Secondary | ICD-10-CM | POA: Diagnosis not present

## 2020-07-23 DIAGNOSIS — I10 Essential (primary) hypertension: Secondary | ICD-10-CM | POA: Diagnosis not present

## 2020-07-23 DIAGNOSIS — I25810 Atherosclerosis of coronary artery bypass graft(s) without angina pectoris: Secondary | ICD-10-CM | POA: Diagnosis not present

## 2020-08-06 DIAGNOSIS — M7989 Other specified soft tissue disorders: Secondary | ICD-10-CM | POA: Diagnosis not present

## 2020-08-20 DIAGNOSIS — E039 Hypothyroidism, unspecified: Secondary | ICD-10-CM | POA: Diagnosis not present

## 2020-10-03 DIAGNOSIS — E039 Hypothyroidism, unspecified: Secondary | ICD-10-CM | POA: Diagnosis not present

## 2020-11-03 ENCOUNTER — Emergency Department (HOSPITAL_COMMUNITY): Payer: Medicare Other

## 2020-11-03 ENCOUNTER — Inpatient Hospital Stay (HOSPITAL_COMMUNITY)
Admission: EM | Admit: 2020-11-03 | Discharge: 2020-11-08 | DRG: 640 | Disposition: A | Discharge status: Skilled Nursing Facility | Payer: Medicare Other | Attending: Internal Medicine | Admitting: Internal Medicine

## 2020-11-03 ENCOUNTER — Encounter (HOSPITAL_COMMUNITY): Payer: Self-pay

## 2020-11-03 ENCOUNTER — Other Ambulatory Visit: Payer: Self-pay

## 2020-11-03 DIAGNOSIS — G9349 Other encephalopathy: Secondary | ICD-10-CM | POA: Diagnosis present

## 2020-11-03 DIAGNOSIS — M170 Bilateral primary osteoarthritis of knee: Secondary | ICD-10-CM | POA: Diagnosis present

## 2020-11-03 DIAGNOSIS — R220 Localized swelling, mass and lump, head: Secondary | ICD-10-CM | POA: Diagnosis not present

## 2020-11-03 DIAGNOSIS — Z820 Family history of epilepsy and other diseases of the nervous system: Secondary | ICD-10-CM | POA: Diagnosis not present

## 2020-11-03 DIAGNOSIS — E872 Acidosis: Secondary | ICD-10-CM | POA: Diagnosis present

## 2020-11-03 DIAGNOSIS — Z9181 History of falling: Secondary | ICD-10-CM | POA: Diagnosis not present

## 2020-11-03 DIAGNOSIS — I2510 Atherosclerotic heart disease of native coronary artery without angina pectoris: Secondary | ICD-10-CM | POA: Diagnosis present

## 2020-11-03 DIAGNOSIS — Z9011 Acquired absence of right breast and nipple: Secondary | ICD-10-CM

## 2020-11-03 DIAGNOSIS — R410 Disorientation, unspecified: Secondary | ICD-10-CM | POA: Diagnosis present

## 2020-11-03 DIAGNOSIS — M1711 Unilateral primary osteoarthritis, right knee: Secondary | ICD-10-CM | POA: Diagnosis not present

## 2020-11-03 DIAGNOSIS — R6889 Other general symptoms and signs: Secondary | ICD-10-CM | POA: Diagnosis not present

## 2020-11-03 DIAGNOSIS — E039 Hypothyroidism, unspecified: Secondary | ICD-10-CM | POA: Diagnosis present

## 2020-11-03 DIAGNOSIS — J449 Chronic obstructive pulmonary disease, unspecified: Secondary | ICD-10-CM | POA: Diagnosis present

## 2020-11-03 DIAGNOSIS — I480 Paroxysmal atrial fibrillation: Secondary | ICD-10-CM | POA: Diagnosis present

## 2020-11-03 DIAGNOSIS — Z043 Encounter for examination and observation following other accident: Secondary | ICD-10-CM | POA: Diagnosis not present

## 2020-11-03 DIAGNOSIS — I69828 Other speech and language deficits following other cerebrovascular disease: Secondary | ICD-10-CM | POA: Diagnosis not present

## 2020-11-03 DIAGNOSIS — G4700 Insomnia, unspecified: Secondary | ICD-10-CM | POA: Diagnosis present

## 2020-11-03 DIAGNOSIS — I700 Atherosclerosis of aorta: Secondary | ICD-10-CM | POA: Diagnosis not present

## 2020-11-03 DIAGNOSIS — I252 Old myocardial infarction: Secondary | ICD-10-CM | POA: Diagnosis not present

## 2020-11-03 DIAGNOSIS — Z23 Encounter for immunization: Secondary | ICD-10-CM

## 2020-11-03 DIAGNOSIS — R2681 Unsteadiness on feet: Secondary | ICD-10-CM | POA: Diagnosis not present

## 2020-11-03 DIAGNOSIS — Z7984 Long term (current) use of oral hypoglycemic drugs: Secondary | ICD-10-CM

## 2020-11-03 DIAGNOSIS — G9341 Metabolic encephalopathy: Secondary | ICD-10-CM | POA: Diagnosis present

## 2020-11-03 DIAGNOSIS — Z20822 Contact with and (suspected) exposure to covid-19: Secondary | ICD-10-CM | POA: Diagnosis present

## 2020-11-03 DIAGNOSIS — Z951 Presence of aortocoronary bypass graft: Secondary | ICD-10-CM

## 2020-11-03 DIAGNOSIS — E785 Hyperlipidemia, unspecified: Secondary | ICD-10-CM | POA: Diagnosis present

## 2020-11-03 DIAGNOSIS — R54 Age-related physical debility: Secondary | ICD-10-CM | POA: Diagnosis present

## 2020-11-03 DIAGNOSIS — I10 Essential (primary) hypertension: Secondary | ICD-10-CM | POA: Diagnosis present

## 2020-11-03 DIAGNOSIS — S0081XA Abrasion of other part of head, initial encounter: Secondary | ICD-10-CM | POA: Diagnosis present

## 2020-11-03 DIAGNOSIS — Z66 Do not resuscitate: Secondary | ICD-10-CM | POA: Diagnosis present

## 2020-11-03 DIAGNOSIS — W19XXXA Unspecified fall, initial encounter: Secondary | ICD-10-CM | POA: Diagnosis present

## 2020-11-03 DIAGNOSIS — M25561 Pain in right knee: Secondary | ICD-10-CM | POA: Diagnosis not present

## 2020-11-03 DIAGNOSIS — D649 Anemia, unspecified: Secondary | ICD-10-CM | POA: Diagnosis not present

## 2020-11-03 DIAGNOSIS — M6281 Muscle weakness (generalized): Secondary | ICD-10-CM | POA: Diagnosis not present

## 2020-11-03 DIAGNOSIS — M1712 Unilateral primary osteoarthritis, left knee: Secondary | ICD-10-CM | POA: Diagnosis not present

## 2020-11-03 DIAGNOSIS — G9340 Encephalopathy, unspecified: Secondary | ICD-10-CM

## 2020-11-03 DIAGNOSIS — E559 Vitamin D deficiency, unspecified: Secondary | ICD-10-CM | POA: Diagnosis not present

## 2020-11-03 DIAGNOSIS — M25562 Pain in left knee: Secondary | ICD-10-CM | POA: Diagnosis not present

## 2020-11-03 DIAGNOSIS — Z87891 Personal history of nicotine dependence: Secondary | ICD-10-CM

## 2020-11-03 DIAGNOSIS — R55 Syncope and collapse: Secondary | ICD-10-CM | POA: Diagnosis not present

## 2020-11-03 DIAGNOSIS — I358 Other nonrheumatic aortic valve disorders: Secondary | ICD-10-CM | POA: Diagnosis present

## 2020-11-03 DIAGNOSIS — M47812 Spondylosis without myelopathy or radiculopathy, cervical region: Secondary | ICD-10-CM | POA: Diagnosis not present

## 2020-11-03 DIAGNOSIS — M5030 Other cervical disc degeneration, unspecified cervical region: Secondary | ICD-10-CM | POA: Diagnosis not present

## 2020-11-03 DIAGNOSIS — Z79899 Other long term (current) drug therapy: Secondary | ICD-10-CM

## 2020-11-03 DIAGNOSIS — S0993XA Unspecified injury of face, initial encounter: Secondary | ICD-10-CM | POA: Diagnosis not present

## 2020-11-03 DIAGNOSIS — R5381 Other malaise: Secondary | ICD-10-CM | POA: Diagnosis not present

## 2020-11-03 DIAGNOSIS — I499 Cardiac arrhythmia, unspecified: Secondary | ICD-10-CM | POA: Diagnosis not present

## 2020-11-03 DIAGNOSIS — Z743 Need for continuous supervision: Secondary | ICD-10-CM | POA: Diagnosis not present

## 2020-11-03 DIAGNOSIS — Z7401 Bed confinement status: Secondary | ICD-10-CM | POA: Diagnosis not present

## 2020-11-03 DIAGNOSIS — M79606 Pain in leg, unspecified: Secondary | ICD-10-CM | POA: Diagnosis not present

## 2020-11-03 DIAGNOSIS — R41841 Cognitive communication deficit: Secondary | ICD-10-CM | POA: Diagnosis not present

## 2020-11-03 DIAGNOSIS — Z853 Personal history of malignant neoplasm of breast: Secondary | ICD-10-CM | POA: Diagnosis not present

## 2020-11-03 DIAGNOSIS — Z9889 Other specified postprocedural states: Secondary | ICD-10-CM | POA: Diagnosis not present

## 2020-11-03 DIAGNOSIS — E860 Dehydration: Principal | ICD-10-CM | POA: Diagnosis present

## 2020-11-03 LAB — CBC WITH DIFFERENTIAL/PLATELET
Abs Immature Granulocytes: 0.02 10*3/uL (ref 0.00–0.07)
Basophils Absolute: 0 10*3/uL (ref 0.0–0.1)
Basophils Relative: 0 %
Eosinophils Absolute: 0 10*3/uL (ref 0.0–0.5)
Eosinophils Relative: 0 %
HCT: 41.9 % (ref 36.0–46.0)
Hemoglobin: 13.4 g/dL (ref 12.0–15.0)
Immature Granulocytes: 0 %
Lymphocytes Relative: 5 %
Lymphs Abs: 0.4 10*3/uL — ABNORMAL LOW (ref 0.7–4.0)
MCH: 27.1 pg (ref 26.0–34.0)
MCHC: 32 g/dL (ref 30.0–36.0)
MCV: 84.6 fL (ref 80.0–100.0)
Monocytes Absolute: 0.3 10*3/uL (ref 0.1–1.0)
Monocytes Relative: 3 %
Neutro Abs: 7.6 10*3/uL (ref 1.7–7.7)
Neutrophils Relative %: 92 %
Platelets: 242 10*3/uL (ref 150–400)
RBC: 4.95 MIL/uL (ref 3.87–5.11)
RDW: 13.8 % (ref 11.5–15.5)
WBC: 8.3 10*3/uL (ref 4.0–10.5)
nRBC: 0 % (ref 0.0–0.2)

## 2020-11-03 LAB — URINALYSIS, ROUTINE W REFLEX MICROSCOPIC
Bilirubin Urine: NEGATIVE
Glucose, UA: NEGATIVE mg/dL
Hgb urine dipstick: NEGATIVE
Ketones, ur: NEGATIVE mg/dL
Nitrite: NEGATIVE
Protein, ur: NEGATIVE mg/dL
Specific Gravity, Urine: 1.024 (ref 1.005–1.030)
pH: 6 (ref 5.0–8.0)

## 2020-11-03 LAB — COMPREHENSIVE METABOLIC PANEL
ALT: 12 U/L (ref 0–44)
AST: 22 U/L (ref 15–41)
Albumin: 3.9 g/dL (ref 3.5–5.0)
Alkaline Phosphatase: 55 U/L (ref 38–126)
Anion gap: 11 (ref 5–15)
BUN: 13 mg/dL (ref 8–23)
CO2: 23 mmol/L (ref 22–32)
Calcium: 9.9 mg/dL (ref 8.9–10.3)
Chloride: 103 mmol/L (ref 98–111)
Creatinine, Ser: 0.81 mg/dL (ref 0.44–1.00)
GFR, Estimated: >60 mL/min (ref >60)
Glucose, Bld: 129 mg/dL — ABNORMAL HIGH (ref 70–99)
Potassium: 4 mmol/L (ref 3.5–5.1)
Sodium: 137 mmol/L (ref 135–145)
Total Bilirubin: 0.7 mg/dL (ref 0.3–1.2)
Total Protein: 7.8 g/dL (ref 6.5–8.1)

## 2020-11-03 LAB — CK: Total CK: 375 U/L — ABNORMAL HIGH (ref 38–234)

## 2020-11-03 LAB — TSH: TSH: 0.475 u[IU]/mL (ref 0.350–4.500)

## 2020-11-03 LAB — RESP PANEL BY RT-PCR (FLU A&B, COVID) ARPGX2
Influenza A by PCR: NEGATIVE
Influenza B by PCR: NEGATIVE
SARS Coronavirus 2 by RT PCR: NEGATIVE

## 2020-11-03 LAB — LACTIC ACID, PLASMA
Lactic Acid, Venous: 2.5 mmol/L (ref 0.5–1.9)
Lactic Acid, Venous: 1.4 mmol/L (ref 0.5–1.9)

## 2020-11-03 LAB — TROPONIN I (HIGH SENSITIVITY)
Troponin I (High Sensitivity): 19 ng/L — ABNORMAL HIGH (ref <18)
Troponin I (High Sensitivity): 27 ng/L — ABNORMAL HIGH (ref <18)

## 2020-11-03 LAB — T4, FREE: Free T4: 1.27 ng/dL — ABNORMAL HIGH (ref 0.61–1.12)

## 2020-11-03 MED ORDER — AMLODIPINE BESYLATE 5 MG PO TABS
5.0000 mg | ORAL_TABLET | Freq: Every day | ORAL | Status: DC
  Administered 2020-11-03 – 2020-11-07 (×5): 5 mg via ORAL
  Filled 2020-11-03 (×5): qty 1

## 2020-11-03 MED ORDER — SODIUM CHLORIDE 0.9 % IV SOLN: INTRAVENOUS | Status: AC

## 2020-11-03 MED ORDER — PRAVASTATIN SODIUM 10 MG PO TABS
20.0000 mg | ORAL_TABLET | Freq: Every day | ORAL | Status: DC
  Administered 2020-11-04 – 2020-11-07 (×4): 20 mg via ORAL
  Filled 2020-11-03 (×4): qty 2

## 2020-11-03 MED ORDER — TRIAMTERENE-HCTZ 37.5-25 MG PO TABS
1.0000 | ORAL_TABLET | Freq: Every day | ORAL | Status: DC
  Administered 2020-11-04 – 2020-11-08 (×5): 1 via ORAL
  Filled 2020-11-03 (×5): qty 1

## 2020-11-03 MED ORDER — SENNOSIDES-DOCUSATE SODIUM 8.6-50 MG PO TABS: 1.0000 | ORAL_TABLET | Freq: Every evening | ORAL | Status: DC | PRN

## 2020-11-03 MED ORDER — TETANUS-DIPHTH-ACELL PERTUSSIS 5-2.5-18.5 LF-MCG/0.5 IM SUSY
0.5000 mL | PREFILLED_SYRINGE | Freq: Once | INTRAMUSCULAR | Status: AC
  Administered 2020-11-03: 0.5 mL via INTRAMUSCULAR
  Filled 2020-11-03: qty 0.5

## 2020-11-03 MED ORDER — ACETAMINOPHEN 650 MG RE SUPP: 650.0000 mg | Freq: Four times a day (QID) | RECTAL | Status: DC | PRN

## 2020-11-03 MED ORDER — ENOXAPARIN SODIUM 30 MG/0.3ML IJ SOSY
30.0000 mg | PREFILLED_SYRINGE | INTRAMUSCULAR | Status: DC
  Administered 2020-11-03: 30 mg via SUBCUTANEOUS
  Filled 2020-11-03: qty 0.3

## 2020-11-03 MED ORDER — HYDRALAZINE HCL 25 MG PO TABS
25.0000 mg | ORAL_TABLET | Freq: Four times a day (QID) | ORAL | Status: DC
  Administered 2020-11-04 – 2020-11-05 (×6): 25 mg via ORAL
  Filled 2020-11-03 (×8): qty 1

## 2020-11-03 MED ORDER — ACETAMINOPHEN 325 MG PO TABS
650.0000 mg | ORAL_TABLET | Freq: Four times a day (QID) | ORAL | Status: DC | PRN
  Administered 2020-11-03 – 2020-11-07 (×3): 650 mg via ORAL
  Filled 2020-11-03 (×3): qty 2

## 2020-11-03 MED ORDER — HYDRALAZINE HCL 20 MG/ML IJ SOLN
5.0000 mg | Freq: Four times a day (QID) | INTRAMUSCULAR | Status: DC | PRN
  Administered 2020-11-03 – 2020-11-06 (×2): 5 mg via INTRAVENOUS
  Filled 2020-11-03: qty 1

## 2020-11-03 MED ORDER — ARTIFICIAL TEARS OPHTHALMIC OINT
1.0000 "application " | TOPICAL_OINTMENT | Freq: Every morning | OPHTHALMIC | Status: DC
  Administered 2020-11-04 – 2020-11-08 (×5): 1 via OPHTHALMIC
  Filled 2020-11-03 (×2): qty 3.5

## 2020-11-03 MED ORDER — HYDRALAZINE HCL 20 MG/ML IJ SOLN
INTRAMUSCULAR | Status: AC
  Filled 2020-11-03: qty 1

## 2020-11-03 MED ORDER — ONDANSETRON HCL 4 MG/2ML IJ SOLN
4.0000 mg | Freq: Four times a day (QID) | INTRAMUSCULAR | Status: DC | PRN
  Administered 2020-11-03: 4 mg via INTRAVENOUS
  Filled 2020-11-03: qty 2

## 2020-11-03 MED ORDER — LEVOTHYROXINE SODIUM 50 MCG PO TABS
50.0000 ug | ORAL_TABLET | Freq: Every day | ORAL | Status: DC
  Administered 2020-11-04 – 2020-11-08 (×5): 50 ug via ORAL
  Filled 2020-11-03 (×5): qty 1

## 2020-11-03 MED ORDER — ONDANSETRON HCL 4 MG PO TABS: 4.0000 mg | ORAL_TABLET | Freq: Four times a day (QID) | ORAL | Status: DC | PRN

## 2020-11-03 MED ORDER — ASCORBIC ACID 500 MG PO TABS
500.0000 mg | ORAL_TABLET | Freq: Every day | ORAL | Status: DC
  Administered 2020-11-04 – 2020-11-08 (×5): 500 mg via ORAL
  Filled 2020-11-03 (×2): qty 2
  Filled 2020-11-03 (×3): qty 1
  Filled 2020-11-03: qty 2

## 2020-11-03 MED ORDER — ASPIRIN EC 81 MG PO TBEC
81.0000 mg | DELAYED_RELEASE_TABLET | Freq: Every day | ORAL | Status: DC
  Administered 2020-11-04 – 2020-11-08 (×5): 81 mg via ORAL
  Filled 2020-11-03 (×5): qty 1

## 2020-11-03 MED ORDER — SODIUM CHLORIDE 0.9 % IV BOLUS
1000.0000 mL | Freq: Once | INTRAVENOUS | Status: AC
  Administered 2020-11-03: 1000 mL via INTRAVENOUS

## 2020-11-03 NOTE — H&P (Addendum)
History and Physical    Natalie Bullock CBJ:[id number] DOB: 06/15/1924 DOA: 11/03/2020  PCP: Seward Carol, MD (Confirm with patient/family/NH records and if not entered, this has to be entered at [facility] point of entry) Patient coming from: Home  I have personally briefly reviewed patient's old medical records in [place]  Chief Complaint: I do not remember what happened.  HPI: Natalie Bullock is a 87 y.o. female with medical history significant of HTN, B/L knee OA, CAD s/p CABG, hypothyroidism, presented with altered mental status.  Patient is unable to provide any history, most history was provided by nephew [name] over the phone.  According to nephew, at baseline, patient cognitive function and daily activity were exceptional for her age.  She has been taking good care of herself and perform most of the household activities.  At baseline, patient uses walker to ambulate, no recent history of fall no complain about lightheadedness or dizziness recently.  This morning, patient was was found on the floor this morning able confused with apparent skin abrasions on the left forehead..  And patient was last seen normal last night.  Patient herself, does not remember that she had episode of fall, she continues to complain about bilateral knee pain which is chronic.  Denies any chest pain or shortness of breath lightheadedness blurry vision, no dysuria, no diarrhea. ED Course: CT head negative for acute finding CT cervical spine negative for fracture or dislocation.  X-ray of other joint showed no signs of fracture or dislocation.  Electrolytes kidney function Normal, no leukocytosis.  Review of Systems: Unable to perform, patient still somewhat confused and hard of hearing.  Past Medical History:  Diagnosis Date  . Anemia   . Arthritis    osteoarthritis  . Breast cancer ([place]) 1975   Right sided mastectomy  . Coronary artery disease   . H/O hypokalemia   . Hx of CABG    pt states she had 5  bypasses  . Hyperlipidemia   . Hypertension   . Hypothyroidism   . IHD (ischemic heart disease)    with CABG in August 2002  . Myocardial infarction (HCC)    Non Q-wave MI  . Vitamin D deficiency     Past Surgical History:  Procedure Laterality Date  . BREAST SURGERY     left breast cyst surgery   . CARDIAC CATHETERIZATION  01/20/2001   NORMAL. EF 60-70%  . CARDIOVASCULAR STRESS TEST  Aug 2010   Normal; EF 74%  . CORONARY ARTERY BYPASS GRAFT  12/2000   LEFT INTERNAL MAMMARY TO THE LAD, SAPHENOUS VEIN GRAFT TO THE FIRST DIAGONAL, SAPHENOUS VEIN GRAFT TO THE RAMUS INTERMEDIATE, AND A SEQUENTIAL SAPHENOUS VEIN GRAFT TO THE POSTERIOR DESCENDING AND POSTERIOR LATERAL BRANCHES.  Marland Kitchen MASS EXCISION  04/08/2011   Procedure: EXCISION MASS;  Surgeon: Wynonia Sours, MD;  Location: [facility];  Service: Orthopedics;  Laterality: Left;  excision mass left palm  . MASTECTOMY  1975   RIGHT SIDE  . TUBAL LIGATION     1960's     reports that she quit smoking about 51 years ago. Her smoking use included cigarettes. She has a 4.00 pack-year smoking history. She has never used smokeless tobacco. She reports that she does not drink alcohol and does not use drugs.  Allergies  Allergen Reactions  . Valium Shortness Of Breath  . Codeine Other (See Comments)    unknown  . Crestor [Rosuvastatin Calcium] Other (See Comments)  unknown  . Doxycycline Other (See Comments)    uknown  . Ezetimibe-Simvastatin Other (See Comments)    unknown  . Lipitor [Atorvastatin Calcium] Other (See Comments)    unknown  . Sulfa Drugs Cross Reactors Other (See Comments)    unknown  . Tequin Other (See Comments)    unknown  . Zocor [Simvastatin] Other (See Comments)    unknown    Family History  Problem Relation Age of Onset  . Alzheimer's disease Mother      Prior to Admission medications   Medication Sig Start Date End Date Taking? Authorizing Provider  amLODipine (NORVASC) 5 MG tablet Take 5  mg by mouth at bedtime.    [provider]  Ascorbic Acid (VITAMIN C IMMUNE HEALTH PO) Take 1 tablet by mouth daily.    [provider]  aspirin 81 MG tablet Take 81 mg by mouth daily.     [provider]  levothyroxine (SYNTHROID, LEVOTHROID) 50 MCG tablet Take 50 mcg by mouth daily before breakfast.     [provider]  Polyethyl Glycol-Propyl Glycol (SYSTANE OP) Place 2 drops into both eyes every morning. For eye dryness.    [provider]  pravastatin (PRAVACHOL) 20 MG tablet TAKE 1 TABLET BY MOUTH EVERYDAY AT BEDTIME 05/11/20   Lelon Perla, MD  triamterene-hydrochlorothiazide (MAXZIDE-25) 37.5-25 MG tablet Take 1 tablet by mouth daily.    [provider]    Physical Exam: Vitals:   11/03/20 1215 11/03/20 1230 11/03/20 1245 11/03/20 1300  BP: (!) 173/67 (!) 185/86 (!) 161/81 (!) 187/75  Pulse: (!) 59 66 68 (!) 55  Resp: 12 14 16 14   Temp:      TempSrc:      SpO2: 100% 96% 100% 97%  Weight:      Height:        Constitutional: NAD, calm, comfortable Vitals:   11/03/20 1215 11/03/20 1230 11/03/20 1245 11/03/20 1300  BP: (!) 173/67 (!) 185/86 (!) 161/81 (!) 187/75  Pulse: (!) 59 66 68 (!) 55  Resp: 12 14 16 14   Temp:      TempSrc:      SpO2: 100% 96% 100% 97%  Weight:      Height:       Eyes: PERRL, lids and conjunctivae normal ENMT: Mucous membranes are dry. Posterior pharynx clear of any exudate or lesions.Normal dentition.  Neck: normal, supple, no masses, no thyromegaly Respiratory: clear to auscultation bilaterally, no wheezing, no crackles. Normal respiratory effort. No accessory muscle use.  Cardiovascular: Regular rate and rhythm, no murmurs / rubs / gallops. No extremity edema. 2+ pedal pulses. No carotid bruits.  Abdomen: no tenderness, no masses palpated. No hepatosplenomegaly. Bowel sounds positive.  Musculoskeletal: no clubbing / cyanosis. No joint deformity upper and lower extremities. Good ROM, no  contractures. Normal muscle tone.  Skin: no rashes, lesions, ulcers. No induration Neurologic: No facial droop, moving all limbs, following simple commands Psychiatric: Calm, oriented to herself and place, confused to time    Labs on Admission: I have personally reviewed following labs and imaging studies  CBC: Recent Labs  Lab 11/03/20 0954  WBC 8.3  NEUTROABS 7.6  HGB 13.4  HCT 41.9  MCV 84.6  PLT 740   Basic Metabolic Panel: Recent Labs  Lab 11/03/20 0954  NA 137  K 4.0  CL 103  CO2 23  GLUCOSE 129*  BUN 13  CREATININE 0.81  CALCIUM 9.9   GFR: Estimated Creatinine Clearance: 39.3 mL/min (by  C-G formula based on SCr of 0.81 mg/dL). Liver Function Tests: Recent Labs  Lab 11/03/20 0954  AST 22  ALT 12  ALKPHOS 55  BILITOT 0.7  PROT 7.8  ALBUMIN 3.9   No results for input(s): LIPASE, AMYLASE in the last 168 hours. No results for input(s): AMMONIA in the last 168 hours. Coagulation Profile: No results for input(s): INR, PROTIME in the last 168 hours. Cardiac Enzymes: Recent Labs  Lab 11/03/20 0954  CKTOTAL 375*   BNP (last 3 results) No results for input(s): PROBNP in the last 8760 hours. HbA1C: No results for input(s): HGBA1C in the last 72 hours. CBG: No results for input(s): GLUCAP in the last 168 hours. Lipid Profile: No results for input(s): CHOL, HDL, LDLCALC, TRIG, CHOLHDL, LDLDIRECT in the last 72 hours. Thyroid Function Tests: Recent Labs    11/03/20 1236  TSH 0.475  FREET4 1.27*   Anemia Panel: No results for input(s): VITAMINB12, FOLATE, FERRITIN, TIBC, IRON, RETICCTPCT in the last 72 hours. Urine analysis:    Component Value Date/Time   COLORURINE YELLOW 11/03/2020 0954   APPEARANCEUR HAZY (A) 11/03/2020 0954   LABSPEC 1.024 11/03/2020 0954   PHURINE 6.0 11/03/2020 0954   GLUCOSEU NEGATIVE 11/03/2020 0954   HGBUR NEGATIVE 11/03/2020 0954   BILIRUBINUR NEGATIVE 11/03/2020 0954   KETONESUR NEGATIVE 11/03/2020 0954   PROTEINUR  NEGATIVE 11/03/2020 0954   UROBILINOGEN 1.0 03/26/2013 0624   NITRITE NEGATIVE 11/03/2020 0954   LEUKOCYTESUR TRACE (A) 11/03/2020 0954    Radiological Exams on Admission: CT Head Wo Contrast  Result Date: 11/03/2020 CLINICAL DATA:  Found down with head and face trauma. Altered mental status. EXAM: CT HEAD WITHOUT CONTRAST CT MAXILLOFACIAL WITHOUT CONTRAST CT CERVICAL SPINE WITHOUT CONTRAST TECHNIQUE: Multidetector CT imaging of the head, cervical spine, and maxillofacial structures were performed using the standard protocol without intravenous contrast. Multiplanar CT image reconstructions of the cervical spine and maxillofacial structures were also generated. COMPARISON:  CT head dated 03/26/2013, CT cervical spine dated 12/05/2012. FINDINGS: CT HEAD FINDINGS Brain: No evidence of acute infarction, hemorrhage, hydrocephalus, extra-axial collection or mass lesion/mass effect. There is moderate cerebral volume loss with associated ex vacuo dilatation. Periventricular white matter hypoattenuation likely represents chronic small vessel ischemic disease. Vascular: There are vascular calcifications in the carotid siphons. Skull: Normal. Negative for fracture or focal lesion. Other: There is scalp soft tissue swelling at the vertex. CT MAXILLOFACIAL FINDINGS Osseous: No fracture or mandibular dislocation. No destructive process. Orbits: Negative. No traumatic or inflammatory finding. Sinuses: There is right ethmoid sinus disease. Soft tissues: There is left periorbital and left forehead soft tissue swelling. CT CERVICAL SPINE FINDINGS Alignment: Normal. Skull base and vertebrae: No acute fracture. No primary bone lesion or focal pathologic process. Soft tissues and spinal canal: No prevertebral fluid or swelling. No visible canal hematoma. Disc levels: Moderate to severe multilevel degenerative disc and joint disease. Upper chest: Negative. Other: None. IMPRESSION: 1. No acute intracranial process. 2. No acute  osseous injury in the cervical spine. 3. No acute facial bone fracture. Electronically Signed   By: Zerita Boers M.D.   On: 11/03/2020 12:09   CT Cervical Spine Wo Contrast  Result Date: 11/03/2020 CLINICAL DATA:  Found down with head and face trauma. Altered mental status. EXAM: CT HEAD WITHOUT CONTRAST CT MAXILLOFACIAL WITHOUT CONTRAST CT CERVICAL SPINE WITHOUT CONTRAST TECHNIQUE: Multidetector CT imaging of the head, cervical spine, and maxillofacial structures were performed using the standard protocol without intravenous contrast. Multiplanar CT image reconstructions of the  cervical spine and maxillofacial structures were also generated. COMPARISON:  CT head dated 03/26/2013, CT cervical spine dated 12/05/2012. FINDINGS: CT HEAD FINDINGS Brain: No evidence of acute infarction, hemorrhage, hydrocephalus, extra-axial collection or mass lesion/mass effect. There is moderate cerebral volume loss with associated ex vacuo dilatation. Periventricular white matter hypoattenuation likely represents chronic small vessel ischemic disease. Vascular: There are vascular calcifications in the carotid siphons. Skull: Normal. Negative for fracture or focal lesion. Other: There is scalp soft tissue swelling at the vertex. CT MAXILLOFACIAL FINDINGS Osseous: No fracture or mandibular dislocation. No destructive process. Orbits: Negative. No traumatic or inflammatory finding. Sinuses: There is right ethmoid sinus disease. Soft tissues: There is left periorbital and left forehead soft tissue swelling. CT CERVICAL SPINE FINDINGS Alignment: Normal. Skull base and vertebrae: No acute fracture. No primary bone lesion or focal pathologic process. Soft tissues and spinal canal: No prevertebral fluid or swelling. No visible canal hematoma. Disc levels: Moderate to severe multilevel degenerative disc and joint disease. Upper chest: Negative. Other: None. IMPRESSION: 1. No acute intracranial process. 2. No acute osseous injury in the  cervical spine. 3. No acute facial bone fracture. Electronically Signed   By: Zerita Boers M.D.   On: 11/03/2020 12:09   DG Pelvis Portable  Result Date: 11/03/2020 CLINICAL DATA:  Fall, leg pain EXAM: PORTABLE PELVIS 1-2 VIEWS COMPARISON:  Radiograph 12/05/2012 FINDINGS: Hips are located. No evidence of femoral neck fracture on AP view. LEFT femur internally rotated. Findings similar to comparison exam. No pelvic fracture or sacral fracture. Calcified leiomyoma noted. IMPRESSION: No evidence of pelvic fracture or hip fracture. Electronically Signed   By: Suzy Bouchard M.D.   On: 11/03/2020 11:03   DG Chest Portable 1 View  Result Date: 11/03/2020 CLINICAL DATA:  Leg pain post fall, history coronary artery disease post MI and CABG, hypertension, ischemic heart disease EXAM: PORTABLE CHEST 1 VIEW COMPARISON:  Portable exam 1042 hours compared to 10/20/2015 FINDINGS: Normal heart size post CABG. Mediastinal contours and pulmonary vascularity normal. Atherosclerotic calcification aorta. Lungs clear. No pulmonary infiltrate, pleural effusion, or pneumothorax. Diffuse osseous demineralization. IMPRESSION: Post CABG. No acute abnormalities. Aortic Atherosclerosis (ICD10-I70.0). Electronically Signed   By: Lavonia Dana M.D.   On: 11/03/2020 11:01   DG Knee Left Port  Result Date: 11/03/2020 CLINICAL DATA:  Leg pain post unwitnessed fall EXAM: PORTABLE LEFT KNEE - 1-2 VIEW COMPARISON:  Portable exam 1045 hours compared to 01/21/2018 FINDINGS: Osseous demineralization. Tricompartmental osteoarthritic changes with joint space narrowing and spur formation greatest at medial compartment. No acute fracture, dislocation, or bone destruction. No joint effusion. Extensive atherosclerotic calcifications of superficial femoral and popliteal arteries. IMPRESSION: Osseous demineralization with tricompartmental osteoarthritic changes. No acute abnormalities. Electronically Signed   By: Lavonia Dana M.D.   On: 11/03/2020 11:02    DG Knee Right Port  Result Date: 11/03/2020 CLINICAL DATA:  Pain following fall EXAM: PORTABLE RIGHT KNEE - 1-2 VIEW COMPARISON:  None. FINDINGS: Frontal and lateral views were obtained. No fracture or dislocation. No joint effusion. There is moderate narrowing in the patellofemoral joint region with milder narrowing medially. There is spurring in all compartments. There is chondrocalcinosis. There are multiple foci of arterial vascular calcification. Postoperative clips noted posterior and medial to the proximal tibia. IMPRESSION: No evident fracture, dislocation, or joint effusion. There is osteoarthritic change, most notably in the patellofemoral joint. Chondrocalcinosis is present which may be seen with osteoarthritis or calcium pyrophosphate deposition disease. There are foci of aortic atherosclerosis. Postoperative change noted posteriorly  and medially. Electronically Signed   By: Lowella Grip III M.D.   On: 11/03/2020 11:02   CT Maxillofacial Wo Contrast  Result Date: 11/03/2020 CLINICAL DATA:  Found down with head and face trauma. Altered mental status. EXAM: CT HEAD WITHOUT CONTRAST CT MAXILLOFACIAL WITHOUT CONTRAST CT CERVICAL SPINE WITHOUT CONTRAST TECHNIQUE: Multidetector CT imaging of the head, cervical spine, and maxillofacial structures were performed using the standard protocol without intravenous contrast. Multiplanar CT image reconstructions of the cervical spine and maxillofacial structures were also generated. COMPARISON:  CT head dated 03/26/2013, CT cervical spine dated 12/05/2012. FINDINGS: CT HEAD FINDINGS Brain: No evidence of acute infarction, hemorrhage, hydrocephalus, extra-axial collection or mass lesion/mass effect. There is moderate cerebral volume loss with associated ex vacuo dilatation. Periventricular white matter hypoattenuation likely represents chronic small vessel ischemic disease. Vascular: There are vascular calcifications in the carotid siphons. Skull: Normal.  Negative for fracture or focal lesion. Other: There is scalp soft tissue swelling at the vertex. CT MAXILLOFACIAL FINDINGS Osseous: No fracture or mandibular dislocation. No destructive process. Orbits: Negative. No traumatic or inflammatory finding. Sinuses: There is right ethmoid sinus disease. Soft tissues: There is left periorbital and left forehead soft tissue swelling. CT CERVICAL SPINE FINDINGS Alignment: Normal. Skull base and vertebrae: No acute fracture. No primary bone lesion or focal pathologic process. Soft tissues and spinal canal: No prevertebral fluid or swelling. No visible canal hematoma. Disc levels: Moderate to severe multilevel degenerative disc and joint disease. Upper chest: Negative. Other: None. IMPRESSION: 1. No acute intracranial process. 2. No acute osseous injury in the cervical spine. 3. No acute facial bone fracture. Electronically Signed   By: Zerita Boers M.D.   On: 11/03/2020 12:09    EKG: Independently reviewed.  Sinus, no QTC or PR interval issue  Assessment/Plan Active Problems:   Delirium   Encephalopathy  (please populate well all problems here in Problem List. (For example, if patient is on BP meds at home and you resume or decide to hold them, it is a problem that needs to be her. Same for CAD, COPD, HLD and so on)  Delirium -Appears to somewhat stabilized/improving. No clear etiology other than the possible concussion sustained when fell on the forehead. If no improvement on mentation, may need repeat CT head tomorrow. -Other possible cause: appears to have at least mild dehydration.  UA negative for UTI and chest x-ray no acute infiltrates. -Avoid sedation medications  Fall -Question of syncope/near syncope given the skin tearing on the left forehead.  Check echocardiogram (most recent one in 2019 with normal) -Troponin mildly elevated probably nonspecific. -Check orthostatic vital signs and keep hydrating -PT evaluation.  HTN -Stable, continue home  BP meds  Hypothyroidism -TSH within normal limits, continue home dose of Synthroid.  DVT prophylaxis: Lovenox  code Status: Full code as requested by family Family Communication: Nephew/POA Disposition Plan: Expect more than 2 midnight hospital stay, probably will need SNF Consults called: None Admission status: Tele admit   Lequita Halt MD [facility] Pager [phone]  11/03/2020, 1:59 PM

## 2020-11-03 NOTE — ED Notes (Signed)
Attempted to I/O cath. With Raquel Sarna RN no urine output.

## 2020-11-03 NOTE — ED Notes (Signed)
Pt transported to CT ?

## 2020-11-03 NOTE — ED Notes (Signed)
Mittens applied to pt d/t pulling at lines and equipment and interfering w/ care

## 2020-11-03 NOTE — ED Notes (Signed)
Attempt report x 3

## 2020-11-03 NOTE — Progress Notes (Signed)
PT Cancellation Note  Patient Details Name: Natalie Bullock MRN: [id number] DOB: Oct 15, 1924   Cancelled Treatment:    Reason Eval/Treat Not Completed: Other (comment).  Pt is unable to be seen, confused and restless now.  Nursing asking PT to wait, will revisit at another time.   Ramond Dial 11/03/2020, 4:51 PM Mee Hives, PT MS Acute Rehab Dept. Number: [place] [facility] [place]

## 2020-11-03 NOTE — ED Triage Notes (Signed)
Pt arrived to ED via EMS from home w/ c/o pt found on floor by neighbor. Pt had unwitnessed fall prone on carpet w/ impact to head on door per EMS. Pt's son last talked to her at 2100 yesterday. Son reported to EMS that pt gets confused when dehydrated. Pt normally A&Ox4. Pt oriented x 2 (self, place). Lac noted to bridge of nose, and occipital region of head.

## 2020-11-03 NOTE — Progress Notes (Signed)
BP elevated, start hydralazine PO and PRN hydralazine IV.

## 2020-11-03 NOTE — ED Notes (Signed)
Attempted to in and out cath pt and did not get any urine. Will try again after pt receives fluids. In meantime, purewick to be placed.

## 2020-11-03 NOTE — ED Notes (Signed)
Attempted report x 2 

## 2020-11-03 NOTE — ED Notes (Signed)
Pt found to have removed mitts, purewick and monitoring equipment and IV noted to be infiltrate. Replaced monitoring equipment and purewick and will work on IV access.

## 2020-11-03 NOTE — ED Notes (Signed)
Attempted report x1. 

## 2020-11-03 NOTE — ED Provider Notes (Signed)
[facility] Provider Note   CSN: [id number] Arrival date & time: 11/03/20  0944     History Chief Complaint  Patient presents with  . Fall  . AMS    Natalie Bullock is a 87 y.o. female with hx of CAD s/p CABG, HLD, HTN, hypothyroidism who was brought to the ED by EMS after being found on the ground by neighbor this morning. Her nephew, who is the primary caretaker, spoke with her last night and stated she was acting normally. She was found outside her bathroom and noted to have blood on her face, they suspect that she went to the bathroom and fell on her way out, striking her head on the doorframe. Patient has dried blood on her face, laceration on the bridge of her nose, and oozing bleeding from the back of her head. She is confused and unable to provide history about the event. She does not recall falling. Endorses pain in both knees but denies headache or pain elsewhere. Nephew notes that she has been confused with UTIs in the past.   Patient lives alone. Her nephew is the primary caretaker and checks on her regularly, but she is able to cook, clean, and dress herself. Has had some falls over the years. They have discussed a long term care facility in the past but are still unsure about this.  Past Medical History:  Diagnosis Date  . Anemia   . Arthritis    osteoarthritis  . Breast cancer ([place]) 1975   Right sided mastectomy  . Coronary artery disease   . H/O hypokalemia   . Hx of CABG    pt states she had 5 bypasses  . Hyperlipidemia   . Hypertension   . Hypothyroidism   . IHD (ischemic heart disease)    with CABG in August 2002  . Myocardial infarction ([facility])    Non Q-wave MI  . Vitamin D deficiency     Patient Active Problem List   Diagnosis Date Noted  . Orthostasis   . Mitral regurgitation 03/07/2014  . Near syncope 03/26/2013  . Syncope 05/19/2012  . Delirium 09/12/2011  . HTN (hypertension) 08/17/2011  . HX: breast cancer  08/14/2011  . Hypothyroidism 08/14/2011  . Hx of CABG 11/19/2010  . Hyperlipidemia 11/19/2010    Past Surgical History:  Procedure Laterality Date  . BREAST SURGERY     left breast cyst surgery   . CARDIAC CATHETERIZATION  01/20/2001   NORMAL. EF 60-70%  . CARDIOVASCULAR STRESS TEST  Aug 2010   Normal; EF 74%  . CORONARY ARTERY BYPASS GRAFT  12/2000   LEFT INTERNAL MAMMARY TO THE LAD, SAPHENOUS VEIN GRAFT TO THE FIRST DIAGONAL, SAPHENOUS VEIN GRAFT TO THE RAMUS INTERMEDIATE, AND A SEQUENTIAL SAPHENOUS VEIN GRAFT TO THE POSTERIOR DESCENDING AND POSTERIOR LATERAL BRANCHES.  Marland Kitchen MASS EXCISION  04/08/2011   Procedure: EXCISION MASS;  Surgeon: Wynonia Sours, MD;  Location: [facility];  Service: Orthopedics;  Laterality: Left;  excision mass left palm  . MASTECTOMY  1975   RIGHT SIDE  . TUBAL LIGATION     1960's     OB History   No obstetric history on file.     Family History  Problem Relation Age of Onset  . Alzheimer's disease Mother     Social History   Tobacco Use  . Smoking status: Former Smoker    Packs/day: 1.00    Years: 4.00    Pack years: 4.00  Types: Cigarettes    Quit date: 06/02/1969    Years since quitting: 51.4  . Smokeless tobacco: Never Used  Substance Use Topics  . Alcohol use: No  . Drug use: No    Home Medications Prior to Admission medications   Medication Sig Start Date End Date Taking? Authorizing Provider  amLODipine (NORVASC) 5 MG tablet Take 5 mg by mouth at bedtime.    [provider]  Ascorbic Acid (VITAMIN C IMMUNE HEALTH PO) Take 1 tablet by mouth daily.    [provider]  aspirin 81 MG tablet Take 81 mg by mouth daily.     [provider]  levothyroxine (SYNTHROID, LEVOTHROID) 50 MCG tablet Take 50 mcg by mouth daily before breakfast.     [provider]  Polyethyl Glycol-Propyl Glycol (SYSTANE OP) Place 2 drops into both eyes every morning. For eye dryness.    [provider]   pravastatin (PRAVACHOL) 20 MG tablet TAKE 1 TABLET BY MOUTH EVERYDAY AT BEDTIME 05/11/20   Lelon Perla, MD  triamterene-hydrochlorothiazide (MAXZIDE-25) 37.5-25 MG tablet Take 1 tablet by mouth daily.    [provider]    Allergies    Valium, Codeine, Crestor [rosuvastatin calcium], Doxycycline, Ezetimibe-simvastatin, Lipitor [atorvastatin calcium], Sulfa drugs cross reactors, Tequin, and Zocor [simvastatin]  Review of Systems   Review of Systems  Unable to perform ROS: Mental status change  Musculoskeletal: Positive for arthralgias. Negative for back pain.  Skin: Positive for wound.  Neurological: Negative for headaches.    Physical Exam Updated Vital Signs BP (!) 187/75   Pulse (!) 55   Temp 98.4 F (36.9 C) (Rectal)   Resp 14   Ht 5\' 3"  (1.6 m)   Wt 64.6 kg   SpO2 97%   BMI 25.23 kg/m   Physical Exam Vitals and nursing note reviewed.  Constitutional:      Appearance: Normal appearance.  HENT:     Head: Normocephalic and atraumatic.     Right Ear: External ear normal.     Left Ear: External ear normal.     Nose: Nose normal.     Mouth/Throat:     Mouth: Mucous membranes are moist.  Eyes:     Extraocular Movements: Extraocular movements intact.     Pupils: Pupils are equal, round, and reactive to light.  Cardiovascular:     Rate and Rhythm: Normal rate and regular rhythm.     Heart sounds: No murmur heard. No friction rub. No gallop.   Pulmonary:     Effort: Pulmonary effort is normal.     Comments: Pulmonary exam limited by inability of patient to cooperate but no abnormal sounds appreciated, seems to be good air movement throughout Abdominal:     General: Abdomen is flat.     Palpations: Abdomen is soft.     Tenderness: There is no abdominal tenderness.  Genitourinary:    Comments: Diaper was full of fluid on presentation Musculoskeletal:        General: No swelling. Normal range of motion.     Cervical back: Normal range of motion and  neck supple.     Comments: Left leg appears shortened and internally rotated Laceration to bridge of nose There is what appears to be an exophytic, flat,  fleshy lesion on the posterior scalp that is bleeding gradually Large 3cm mass on the medial aspect of the left leg, no bruising, likely has been there chronically No spinal stepoffs No spinal tenderness Pain seemingly in her knees  bilaterally with any manipulation of either extremity Hips area stable, no pain No other deformities appreciated  Skin:    General: Skin is warm and dry.  Neurological:     General: No focal deficit present.     Mental Status: She is alert.     Comments: Moves all extremities to command, no clear focal weakness Unable to cooperate for full neuro exam Oriented to person and place only  Psychiatric:        Mood and Affect: Mood normal.        Behavior: Behavior normal.     ED Results / Procedures / Treatments   Labs (all labs ordered are listed, but only abnormal results are displayed) Labs Reviewed  URINALYSIS, ROUTINE W REFLEX MICROSCOPIC - Abnormal; Notable for the following components:      Result Value   APPearance HAZY (*)    Leukocytes,Ua TRACE (*)    Bacteria, UA RARE (*)    All other components within normal limits  COMPREHENSIVE METABOLIC PANEL - Abnormal; Notable for the following components:   Glucose, Bld 129 (*)    All other components within normal limits  CBC WITH DIFFERENTIAL/PLATELET - Abnormal; Notable for the following components:   Lymphs Abs 0.4 (*)    All other components within normal limits  LACTIC ACID, PLASMA - Abnormal; Notable for the following components:   Lactic Acid, Venous 2.5 (*)    All other components within normal limits  CK - Abnormal; Notable for the following components:   Total CK 375 (*)    All other components within normal limits  TROPONIN I (HIGH SENSITIVITY) - Abnormal; Notable for the following components:   Troponin I (High Sensitivity) 19 (*)     All other components within normal limits  TROPONIN I (HIGH SENSITIVITY) - Abnormal; Notable for the following components:   Troponin I (High Sensitivity) 27 (*)    All other components within normal limits  RESP PANEL BY RT-PCR (FLU A&B, COVID) [id number]  LACTIC ACID, PLASMA  TSH  T4, FREE    EKG EKG Interpretation  Date/Time:  Saturday November 03 2020 09:58:11 EDT Ventricular Rate:  66 PR Interval:  193 QRS Duration: 111 QT Interval:  409 QTC Calculation: 429 R Axis:   16 Text Interpretation: Sinus rhythm Nonspecific T abnormalities, anterior leads New TW abnormalities anterior leads in comparison to prior Confirmed by Gareth Morgan [phone]) on 11/03/2020 10:12:07 AM   Radiology CT Head Wo Contrast  Result Date: 11/03/2020 CLINICAL DATA:  Found down with head and face trauma. Altered mental status. EXAM: CT HEAD WITHOUT CONTRAST CT MAXILLOFACIAL WITHOUT CONTRAST CT CERVICAL SPINE WITHOUT CONTRAST TECHNIQUE: Multidetector CT imaging of the head, cervical spine, and maxillofacial structures were performed using the standard protocol without intravenous contrast. Multiplanar CT image reconstructions of the cervical spine and maxillofacial structures were also generated. COMPARISON:  CT head dated 03/26/2013, CT cervical spine dated 12/05/2012. FINDINGS: CT HEAD FINDINGS Brain: No evidence of acute infarction, hemorrhage, hydrocephalus, extra-axial collection or mass lesion/mass effect. There is moderate cerebral volume loss with associated ex vacuo dilatation. Periventricular white matter hypoattenuation likely represents chronic small vessel ischemic disease. Vascular: There are vascular calcifications in the carotid siphons. Skull: Normal. Negative for fracture or focal lesion. Other: There is scalp soft tissue swelling at the vertex. CT MAXILLOFACIAL FINDINGS Osseous: No fracture or mandibular dislocation. No destructive process. Orbits: Negative. No traumatic or inflammatory finding.  Sinuses: There is right ethmoid sinus disease. Soft tissues: There is left periorbital  and left forehead soft tissue swelling. CT CERVICAL SPINE FINDINGS Alignment: Normal. Skull base and vertebrae: No acute fracture. No primary bone lesion or focal pathologic process. Soft tissues and spinal canal: No prevertebral fluid or swelling. No visible canal hematoma. Disc levels: Moderate to severe multilevel degenerative disc and joint disease. Upper chest: Negative. Other: None. IMPRESSION: 1. No acute intracranial process. 2. No acute osseous injury in the cervical spine. 3. No acute facial bone fracture. Electronically Signed   By: Zerita Boers M.D.   On: 11/03/2020 12:09   CT Cervical Spine Wo Contrast  Result Date: 11/03/2020 CLINICAL DATA:  Found down with head and face trauma. Altered mental status. EXAM: CT HEAD WITHOUT CONTRAST CT MAXILLOFACIAL WITHOUT CONTRAST CT CERVICAL SPINE WITHOUT CONTRAST TECHNIQUE: Multidetector CT imaging of the head, cervical spine, and maxillofacial structures were performed using the standard protocol without intravenous contrast. Multiplanar CT image reconstructions of the cervical spine and maxillofacial structures were also generated. COMPARISON:  CT head dated 03/26/2013, CT cervical spine dated 12/05/2012. FINDINGS: CT HEAD FINDINGS Brain: No evidence of acute infarction, hemorrhage, hydrocephalus, extra-axial collection or mass lesion/mass effect. There is moderate cerebral volume loss with associated ex vacuo dilatation. Periventricular white matter hypoattenuation likely represents chronic small vessel ischemic disease. Vascular: There are vascular calcifications in the carotid siphons. Skull: Normal. Negative for fracture or focal lesion. Other: There is scalp soft tissue swelling at the vertex. CT MAXILLOFACIAL FINDINGS Osseous: No fracture or mandibular dislocation. No destructive process. Orbits: Negative. No traumatic or inflammatory finding. Sinuses: There is right  ethmoid sinus disease. Soft tissues: There is left periorbital and left forehead soft tissue swelling. CT CERVICAL SPINE FINDINGS Alignment: Normal. Skull base and vertebrae: No acute fracture. No primary bone lesion or focal pathologic process. Soft tissues and spinal canal: No prevertebral fluid or swelling. No visible canal hematoma. Disc levels: Moderate to severe multilevel degenerative disc and joint disease. Upper chest: Negative. Other: None. IMPRESSION: 1. No acute intracranial process. 2. No acute osseous injury in the cervical spine. 3. No acute facial bone fracture. Electronically Signed   By: Zerita Boers M.D.   On: 11/03/2020 12:09   DG Pelvis Portable  Result Date: 11/03/2020 CLINICAL DATA:  Fall, leg pain EXAM: PORTABLE PELVIS 1-2 VIEWS COMPARISON:  Radiograph 12/05/2012 FINDINGS: Hips are located. No evidence of femoral neck fracture on AP view. LEFT femur internally rotated. Findings similar to comparison exam. No pelvic fracture or sacral fracture. Calcified leiomyoma noted. IMPRESSION: No evidence of pelvic fracture or hip fracture. Electronically Signed   By: Suzy Bouchard M.D.   On: 11/03/2020 11:03   DG Chest Portable 1 View  Result Date: 11/03/2020 CLINICAL DATA:  Leg pain post fall, history coronary artery disease post MI and CABG, hypertension, ischemic heart disease EXAM: PORTABLE CHEST 1 VIEW COMPARISON:  Portable exam 1042 hours compared to 10/20/2015 FINDINGS: Normal heart size post CABG. Mediastinal contours and pulmonary vascularity normal. Atherosclerotic calcification aorta. Lungs clear. No pulmonary infiltrate, pleural effusion, or pneumothorax. Diffuse osseous demineralization. IMPRESSION: Post CABG. No acute abnormalities. Aortic Atherosclerosis (ICD10-I70.0). Electronically Signed   By: Lavonia Dana M.D.   On: 11/03/2020 11:01   DG Knee Left Port  Result Date: 11/03/2020 CLINICAL DATA:  Leg pain post unwitnessed fall EXAM: PORTABLE LEFT KNEE - 1-2 VIEW COMPARISON:   Portable exam 1045 hours compared to 01/21/2018 FINDINGS: Osseous demineralization. Tricompartmental osteoarthritic changes with joint space narrowing and spur formation greatest at medial compartment. No acute fracture, dislocation, or bone destruction. No  joint effusion. Extensive atherosclerotic calcifications of superficial femoral and popliteal arteries. IMPRESSION: Osseous demineralization with tricompartmental osteoarthritic changes. No acute abnormalities. Electronically Signed   By: Lavonia Dana M.D.   On: 11/03/2020 11:02   DG Knee Right Port  Result Date: 11/03/2020 CLINICAL DATA:  Pain following fall EXAM: PORTABLE RIGHT KNEE - 1-2 VIEW COMPARISON:  None. FINDINGS: Frontal and lateral views were obtained. No fracture or dislocation. No joint effusion. There is moderate narrowing in the patellofemoral joint region with milder narrowing medially. There is spurring in all compartments. There is chondrocalcinosis. There are multiple foci of arterial vascular calcification. Postoperative clips noted posterior and medial to the proximal tibia. IMPRESSION: No evident fracture, dislocation, or joint effusion. There is osteoarthritic change, most notably in the patellofemoral joint. Chondrocalcinosis is present which may be seen with osteoarthritis or calcium pyrophosphate deposition disease. There are foci of aortic atherosclerosis. Postoperative change noted posteriorly and medially. Electronically Signed   By: Lowella Grip III M.D.   On: 11/03/2020 11:02   CT Maxillofacial Wo Contrast  Result Date: 11/03/2020 CLINICAL DATA:  Found down with head and face trauma. Altered mental status. EXAM: CT HEAD WITHOUT CONTRAST CT MAXILLOFACIAL WITHOUT CONTRAST CT CERVICAL SPINE WITHOUT CONTRAST TECHNIQUE: Multidetector CT imaging of the head, cervical spine, and maxillofacial structures were performed using the standard protocol without intravenous contrast. Multiplanar CT image reconstructions of the cervical  spine and maxillofacial structures were also generated. COMPARISON:  CT head dated 03/26/2013, CT cervical spine dated 12/05/2012. FINDINGS: CT HEAD FINDINGS Brain: No evidence of acute infarction, hemorrhage, hydrocephalus, extra-axial collection or mass lesion/mass effect. There is moderate cerebral volume loss with associated ex vacuo dilatation. Periventricular white matter hypoattenuation likely represents chronic small vessel ischemic disease. Vascular: There are vascular calcifications in the carotid siphons. Skull: Normal. Negative for fracture or focal lesion. Other: There is scalp soft tissue swelling at the vertex. CT MAXILLOFACIAL FINDINGS Osseous: No fracture or mandibular dislocation. No destructive process. Orbits: Negative. No traumatic or inflammatory finding. Sinuses: There is right ethmoid sinus disease. Soft tissues: There is left periorbital and left forehead soft tissue swelling. CT CERVICAL SPINE FINDINGS Alignment: Normal. Skull base and vertebrae: No acute fracture. No primary bone lesion or focal pathologic process. Soft tissues and spinal canal: No prevertebral fluid or swelling. No visible canal hematoma. Disc levels: Moderate to severe multilevel degenerative disc and joint disease. Upper chest: Negative. Other: None. IMPRESSION: 1. No acute intracranial process. 2. No acute osseous injury in the cervical spine. 3. No acute facial bone fracture. Electronically Signed   By: Zerita Boers M.D.   On: 11/03/2020 12:09    Procedures Procedures   Medications Ordered in ED Medications  Tdap (BOOSTRIX) injection 0.5 mL (has no administration in time range)  sodium chloride 0.9 % bolus 1,000 mL (0 mLs Intravenous Stopped 11/03/20 1257)    ED Course  I have reviewed the triage vital signs and the nursing notes.  Pertinent labs & imaging results that were available during my care of the patient were reviewed by me and considered in my medical decision making (see chart for  details).    MDM Rules/Calculators/A&P                          Patient presents with altered mental status, lacerations, knee pain, and shortening and rotation of left leg. Nephew, who is the primary caretaker, spoke with her last evening and states she was acting normally. She was  found on the ground by neighbor this morning, unknown time on the floor. Checking CBC, BMP, CK, lactic acid, urinalysis. Unfortunately unable to obtain urine form in and out cath, will administer fluids and place Purewick. Imaging of head, face, cervical spine, hips, and knees ordered.   Xray negative for fracture. CT of head, face, and neck negative for acute fracture or bleeding. CBC benign. Mild elevation in lactic acid 2.5, resolved with IV fluids. CK mildly elevated at 375. No kidney injury.  Mental status improving a little, but still encephalopathic. Admit to hospitalist service.    Final Clinical Impression(s) / ED Diagnoses Final diagnoses:  Fall, initial encounter  Encephalopathy  Dehydration    Rx / DC Orders ED Discharge Orders    None       Andrew Au, MD 11/03/20 1349    Gareth Morgan, MD 11/03/20 2245

## 2020-11-03 NOTE — ED Notes (Signed)
Radiology at bedside

## 2020-11-04 ENCOUNTER — Other Ambulatory Visit (HOSPITAL_COMMUNITY): Payer: Medicare Other

## 2020-11-04 DIAGNOSIS — E860 Dehydration: Principal | ICD-10-CM

## 2020-11-04 LAB — BASIC METABOLIC PANEL
Anion gap: 7 (ref 5–15)
BUN: 10 mg/dL (ref 8–23)
CO2: 25 mmol/L (ref 22–32)
Calcium: 9.1 mg/dL (ref 8.9–10.3)
Chloride: 104 mmol/L (ref 98–111)
Creatinine, Ser: 0.73 mg/dL (ref 0.44–1.00)
GFR, Estimated: >60 mL/min (ref >60)
Glucose, Bld: 75 mg/dL (ref 70–99)
Potassium: 3.8 mmol/L (ref 3.5–5.1)
Sodium: 136 mmol/L (ref 135–145)

## 2020-11-04 MED ORDER — HALOPERIDOL LACTATE 5 MG/ML IJ SOLN
2.5000 mg | Freq: Once | INTRAMUSCULAR | Status: AC
  Administered 2020-11-05: 2.5 mg via INTRAVENOUS
  Filled 2020-11-04: qty 1

## 2020-11-04 MED ORDER — ENOXAPARIN SODIUM 40 MG/0.4ML IJ SOSY
40.0000 mg | PREFILLED_SYRINGE | INTRAMUSCULAR | Status: DC
  Administered 2020-11-04 – 2020-11-07 (×4): 40 mg via SUBCUTANEOUS
  Filled 2020-11-04 (×3): qty 0.4

## 2020-11-04 NOTE — Progress Notes (Signed)
PROGRESS NOTE    Natalie Bullock  IDP:[id number] DOB: [birth year] DOA: 11/03/2020 PCP: Seward Carol, MD    Brief Narrative:  87 y.o. female with medical history significant of HTN, B/L knee OA, CAD s/p CABG, hypothyroidism, presented with altered mental status  Assessment & Plan:   Active Problems:   Delirium   Encephalopathy  Delirium -Improved today, seems to be alert and oriented x3 -Clinically dehydrated on exam -Continued on IVF hydratino -PT/OT consulted -repeat bmet in AM  Fall -Question of syncope/near syncope given the skin tearing on the left forehead.  Check echocardiogram (most recent one in 2019 with normal) -Troponin mildly elevated probably nonspecific. -Per above, appears to be dehydrated -PT consulted  HTN -Stable and controlled this afternoon - continue home BP meds  Hypothyroidism -TSH within normal limits, continue home dose of Synthroid.   DVT prophylaxis: Lovenox subq Code Status: Full Family Communication: Pt in room, family not at bedside  Status is: Inpatient  Remains inpatient appropriate because:Inpatient level of care appropriate due to severity of illness   Dispo: The patient is from: Home              Anticipated d/c is to: Unclear at this time              Patient currently is not medically stable to d/c.   Difficult to place patient No   Consultants:     Procedures:     Antimicrobials: Anti-infectives (From admission, onward)   None       Subjective: Reports feeling better. Eager to go home  Objective: Vitals:   11/03/20 2009 11/04/20 0458 11/04/20 1309 11/04/20 1335  BP: (!) 157/82 134/77 (!) 110/56 127/67  Pulse: (!) 106 67 90   Resp:  20 18   Temp: 98.4 F (36.9 C) 98.2 F (36.8 C) 97.6 F (36.4 C)   TempSrc: Oral Oral    SpO2: 100% 90%    Weight:      Height:        Intake/Output Summary (Last 24 hours) at 11/04/2020 1920 Last data filed at 11/04/2020 0700 Gross per 24 hour  Intake 989.56 ml   Output 550 ml  Net 439.56 ml   Filed Weights   11/03/20 0952  Weight: 64.6 kg    Examination: General exam: Awake, laying in bed, in nad Respiratory system: Normal respiratory effort, no wheezing Cardiovascular system: regular rate, s1, s2 Gastrointestinal system: Soft, nondistended, positive BS Central nervous system: CN2-12 grossly intact, strength intact Extremities: Perfused, no clubbing Skin: Normal skin turgor, no notable skin lesions seen Psychiatry: Mood normal // no visual hallucinations   Data Reviewed: I have personally reviewed following labs and imaging studies  CBC: Recent Labs  Lab 11/03/20 0954  WBC 8.3  NEUTROABS 7.6  HGB 13.4  HCT 41.9  MCV 84.6  PLT 443   Basic Metabolic Panel: Recent Labs  Lab 11/03/20 0954 11/04/20 0202  NA 137 136  K 4.0 3.8  CL 103 104  CO2 23 25  GLUCOSE 129* 75  BUN 13 10  CREATININE 0.81 0.73  CALCIUM 9.9 9.1   GFR: Estimated Creatinine Clearance: 39.7 mL/min (by C-G formula based on SCr of 0.73 mg/dL). Liver Function Tests: Recent Labs  Lab 11/03/20 0954  AST 22  ALT 12  ALKPHOS 55  BILITOT 0.7  PROT 7.8  ALBUMIN 3.9   No results for input(s): LIPASE, AMYLASE in the last 168 hours. No results for input(s): AMMONIA in the last 168 hours.  Coagulation Profile: No results for input(s): INR, PROTIME in the last 168 hours. Cardiac Enzymes: Recent Labs  Lab 11/03/20 0954  CKTOTAL 375*   BNP (last 3 results) No results for input(s): PROBNP in the last 8760 hours. HbA1C: No results for input(s): HGBA1C in the last 72 hours. CBG: No results for input(s): GLUCAP in the last 168 hours. Lipid Profile: No results for input(s): CHOL, HDL, LDLCALC, TRIG, CHOLHDL, LDLDIRECT in the last 72 hours. Thyroid Function Tests: Recent Labs    11/03/20 1236  TSH 0.475  FREET4 1.27*   Anemia Panel: No results for input(s): VITAMINB12, FOLATE, FERRITIN, TIBC, IRON, RETICCTPCT in the last 72 hours. Sepsis  Labs: Recent Labs  Lab 11/03/20 1005 11/03/20 1205  LATICACIDVEN 2.5* 1.4    Recent Results (from the past 240 hour(s))  Resp Panel by RT-PCR (Flu A&B, Covid) Nasopharyngeal Swab     Status: None   Collection Time: 11/03/20 11:28 AM   Specimen: Nasopharyngeal Swab; Nasopharyngeal(NP) swabs in vial transport medium  Result Value Ref Range Status   SARS Coronavirus 2 by RT PCR NEGATIVE NEGATIVE Final    Comment: (NOTE) SARS-CoV-2 target nucleic acids are NOT DETECTED.  The SARS-CoV-2 RNA is generally detectable in upper respiratory specimens during the acute phase of infection. The lowest concentration of SARS-CoV-2 viral copies this assay can detect is 138 copies/mL. A negative result does not preclude SARS-Cov-2 infection and should not be used as the sole basis for treatment or other patient management decisions. A negative result may occur with  improper specimen collection/handling, submission of specimen other than nasopharyngeal swab, presence of viral mutation(s) within the areas targeted by this assay, and inadequate number of viral copies(<138 copies/mL). A negative result must be combined with clinical observations, patient history, and epidemiological information. The expected result is Negative.  Fact Sheet for Patients:  EntrepreneurPulse.com.au  Fact Sheet for Healthcare Providers:  IncredibleEmployment.be  This test is no t yet approved or cleared by the [place] FDA and  has been authorized for detection and/or diagnosis of SARS-CoV-2 by FDA under an Emergency Use Authorization (EUA). This EUA will remain  in effect (meaning this test can be used) for the duration of the COVID-19 declaration under Section 564(b)(1) of the Act, 21 U.S.C.section 360bbb-3(b)(1), unless the authorization is terminated  or revoked sooner.       Influenza A by PCR NEGATIVE NEGATIVE Final   Influenza B by PCR NEGATIVE NEGATIVE Final     Comment: (NOTE) The Xpert Xpress SARS-CoV-2/FLU/RSV plus assay is intended as an aid in the diagnosis of influenza from Nasopharyngeal swab specimens and should not be used as a sole basis for treatment. Nasal washings and aspirates are unacceptable for Xpert Xpress SARS-CoV-2/FLU/RSV testing.  Fact Sheet for Patients: EntrepreneurPulse.com.au  Fact Sheet for Healthcare Providers: IncredibleEmployment.be  This test is not yet approved or cleared by the [place] FDA and has been authorized for detection and/or diagnosis of SARS-CoV-2 by FDA under an Emergency Use Authorization (EUA). This EUA will remain in effect (meaning this test can be used) for the duration of the COVID-19 declaration under Section 564(b)(1) of the Act, 21 U.S.C. section 360bbb-3(b)(1), unless the authorization is terminated or revoked.  Performed at [facility], [place] [address], [place], [place] [address]      Radiology Studies: CT Head Wo Contrast  Result Date: 11/03/2020 CLINICAL DATA:  Found down with head and face trauma. Altered mental status. EXAM: CT HEAD WITHOUT CONTRAST CT MAXILLOFACIAL WITHOUT CONTRAST  CT CERVICAL SPINE WITHOUT CONTRAST TECHNIQUE: Multidetector CT imaging of the head, cervical spine, and maxillofacial structures were performed using the standard protocol without intravenous contrast. Multiplanar CT image reconstructions of the cervical spine and maxillofacial structures were also generated. COMPARISON:  CT head dated 03/26/2013, CT cervical spine dated 12/05/2012. FINDINGS: CT HEAD FINDINGS Brain: No evidence of acute infarction, hemorrhage, hydrocephalus, extra-axial collection or mass lesion/mass effect. There is moderate cerebral volume loss with associated ex vacuo dilatation. Periventricular white matter hypoattenuation likely represents chronic small vessel ischemic disease. Vascular: There are vascular calcifications in the carotid  siphons. Skull: Normal. Negative for fracture or focal lesion. Other: There is scalp soft tissue swelling at the vertex. CT MAXILLOFACIAL FINDINGS Osseous: No fracture or mandibular dislocation. No destructive process. Orbits: Negative. No traumatic or inflammatory finding. Sinuses: There is right ethmoid sinus disease. Soft tissues: There is left periorbital and left forehead soft tissue swelling. CT CERVICAL SPINE FINDINGS Alignment: Normal. Skull base and vertebrae: No acute fracture. No primary bone lesion or focal pathologic process. Soft tissues and spinal canal: No prevertebral fluid or swelling. No visible canal hematoma. Disc levels: Moderate to severe multilevel degenerative disc and joint disease. Upper chest: Negative. Other: None. IMPRESSION: 1. No acute intracranial process. 2. No acute osseous injury in the cervical spine. 3. No acute facial bone fracture. Electronically Signed   By: Zerita Boers M.D.   On: 11/03/2020 12:09   CT Cervical Spine Wo Contrast  Result Date: 11/03/2020 CLINICAL DATA:  Found down with head and face trauma. Altered mental status. EXAM: CT HEAD WITHOUT CONTRAST CT MAXILLOFACIAL WITHOUT CONTRAST CT CERVICAL SPINE WITHOUT CONTRAST TECHNIQUE: Multidetector CT imaging of the head, cervical spine, and maxillofacial structures were performed using the standard protocol without intravenous contrast. Multiplanar CT image reconstructions of the cervical spine and maxillofacial structures were also generated. COMPARISON:  CT head dated 03/26/2013, CT cervical spine dated 12/05/2012. FINDINGS: CT HEAD FINDINGS Brain: No evidence of acute infarction, hemorrhage, hydrocephalus, extra-axial collection or mass lesion/mass effect. There is moderate cerebral volume loss with associated ex vacuo dilatation. Periventricular white matter hypoattenuation likely represents chronic small vessel ischemic disease. Vascular: There are vascular calcifications in the carotid siphons. Skull: Normal.  Negative for fracture or focal lesion. Other: There is scalp soft tissue swelling at the vertex. CT MAXILLOFACIAL FINDINGS Osseous: No fracture or mandibular dislocation. No destructive process. Orbits: Negative. No traumatic or inflammatory finding. Sinuses: There is right ethmoid sinus disease. Soft tissues: There is left periorbital and left forehead soft tissue swelling. CT CERVICAL SPINE FINDINGS Alignment: Normal. Skull base and vertebrae: No acute fracture. No primary bone lesion or focal pathologic process. Soft tissues and spinal canal: No prevertebral fluid or swelling. No visible canal hematoma. Disc levels: Moderate to severe multilevel degenerative disc and joint disease. Upper chest: Negative. Other: None. IMPRESSION: 1. No acute intracranial process. 2. No acute osseous injury in the cervical spine. 3. No acute facial bone fracture. Electronically Signed   By: Zerita Boers M.D.   On: 11/03/2020 12:09   DG Pelvis Portable  Result Date: 11/03/2020 CLINICAL DATA:  Fall, leg pain EXAM: PORTABLE PELVIS 1-2 VIEWS COMPARISON:  Radiograph 12/05/2012 FINDINGS: Hips are located. No evidence of femoral neck fracture on AP view. LEFT femur internally rotated. Findings similar to comparison exam. No pelvic fracture or sacral fracture. Calcified leiomyoma noted. IMPRESSION: No evidence of pelvic fracture or hip fracture. Electronically Signed   By: Suzy Bouchard M.D.   On: 11/03/2020 11:03   DG  Chest Portable 1 View  Result Date: 11/03/2020 CLINICAL DATA:  Leg pain post fall, history coronary artery disease post MI and CABG, hypertension, ischemic heart disease EXAM: PORTABLE CHEST 1 VIEW COMPARISON:  Portable exam 1042 hours compared to 10/20/2015 FINDINGS: Normal heart size post CABG. Mediastinal contours and pulmonary vascularity normal. Atherosclerotic calcification aorta. Lungs clear. No pulmonary infiltrate, pleural effusion, or pneumothorax. Diffuse osseous demineralization. IMPRESSION: Post CABG.  No acute abnormalities. Aortic Atherosclerosis (ICD10-I70.0). Electronically Signed   By: Lavonia Dana M.D.   On: 11/03/2020 11:01   DG Knee Left Port  Result Date: 11/03/2020 CLINICAL DATA:  Leg pain post unwitnessed fall EXAM: PORTABLE LEFT KNEE - 1-2 VIEW COMPARISON:  Portable exam 1045 hours compared to 01/21/2018 FINDINGS: Osseous demineralization. Tricompartmental osteoarthritic changes with joint space narrowing and spur formation greatest at medial compartment. No acute fracture, dislocation, or bone destruction. No joint effusion. Extensive atherosclerotic calcifications of superficial femoral and popliteal arteries. IMPRESSION: Osseous demineralization with tricompartmental osteoarthritic changes. No acute abnormalities. Electronically Signed   By: Lavonia Dana M.D.   On: 11/03/2020 11:02   DG Knee Right Port  Result Date: 11/03/2020 CLINICAL DATA:  Pain following fall EXAM: PORTABLE RIGHT KNEE - 1-2 VIEW COMPARISON:  None. FINDINGS: Frontal and lateral views were obtained. No fracture or dislocation. No joint effusion. There is moderate narrowing in the patellofemoral joint region with milder narrowing medially. There is spurring in all compartments. There is chondrocalcinosis. There are multiple foci of arterial vascular calcification. Postoperative clips noted posterior and medial to the proximal tibia. IMPRESSION: No evident fracture, dislocation, or joint effusion. There is osteoarthritic change, most notably in the patellofemoral joint. Chondrocalcinosis is present which may be seen with osteoarthritis or calcium pyrophosphate deposition disease. There are foci of aortic atherosclerosis. Postoperative change noted posteriorly and medially. Electronically Signed   By: Lowella Grip III M.D.   On: 11/03/2020 11:02   CT Maxillofacial Wo Contrast  Result Date: 11/03/2020 CLINICAL DATA:  Found down with head and face trauma. Altered mental status. EXAM: CT HEAD WITHOUT CONTRAST CT MAXILLOFACIAL  WITHOUT CONTRAST CT CERVICAL SPINE WITHOUT CONTRAST TECHNIQUE: Multidetector CT imaging of the head, cervical spine, and maxillofacial structures were performed using the standard protocol without intravenous contrast. Multiplanar CT image reconstructions of the cervical spine and maxillofacial structures were also generated. COMPARISON:  CT head dated 03/26/2013, CT cervical spine dated 12/05/2012. FINDINGS: CT HEAD FINDINGS Brain: No evidence of acute infarction, hemorrhage, hydrocephalus, extra-axial collection or mass lesion/mass effect. There is moderate cerebral volume loss with associated ex vacuo dilatation. Periventricular white matter hypoattenuation likely represents chronic small vessel ischemic disease. Vascular: There are vascular calcifications in the carotid siphons. Skull: Normal. Negative for fracture or focal lesion. Other: There is scalp soft tissue swelling at the vertex. CT MAXILLOFACIAL FINDINGS Osseous: No fracture or mandibular dislocation. No destructive process. Orbits: Negative. No traumatic or inflammatory finding. Sinuses: There is right ethmoid sinus disease. Soft tissues: There is left periorbital and left forehead soft tissue swelling. CT CERVICAL SPINE FINDINGS Alignment: Normal. Skull base and vertebrae: No acute fracture. No primary bone lesion or focal pathologic process. Soft tissues and spinal canal: No prevertebral fluid or swelling. No visible canal hematoma. Disc levels: Moderate to severe multilevel degenerative disc and joint disease. Upper chest: Negative. Other: None. IMPRESSION: 1. No acute intracranial process. 2. No acute osseous injury in the cervical spine. 3. No acute facial bone fracture. Electronically Signed   By: Zerita Boers M.D.   On: 11/03/2020 12:09  Scheduled Meds: . amLODipine  5 mg Oral QHS  . artificial tears  1 application Both Eyes q morning  . aspirin EC  81 mg Oral Daily  . enoxaparin (LOVENOX) injection  40 mg Subcutaneous Q24H  .  hydrALAZINE  25 mg Oral Q6H  . levothyroxine  50 mcg Oral QAC breakfast  . pravastatin  20 mg Oral q1800  . triamterene-hydrochlorothiazide  1 tablet Oral Daily  . vitamin C  500 mg Oral Daily   Continuous Infusions:   LOS: 1 day   Marylu Lund, MD [facility] Pager On Amion  If 7PM-7AM, please contact night-coverage 11/04/2020, 7:20 PM

## 2020-11-04 NOTE — Evaluation (Signed)
Physical Therapy Evaluation Patient Details Name: Natalie Bullock MRN: [id number] DOB: 07/13/1924 Today's Date: 11/04/2020   History of Present Illness  Natalie Bullock is a 87 y.o. female who presented with altered mental status; with medical history significant of HTN, B/L knee OA, CAD s/p CABG, hypothyroidism  Clinical Impression   Pt admitted with above diagnosis. Per chart review, lives at home and managing independently, walking with RW (possibly rollator) at baseline; Today with PT eval pt very sleepy, difficult to arouse; Pulled back to lay back down almost immediately upon sitting; Unable to obtain sitting and standing BPs;  Very limited eval due to decr participation, but she was able to communicate her desire to lay back down, and move herself to achieve that goal;  Pt currently with functional limitations due to the deficits listed below (see PT Problem List). Pt will benefit from skilled PT to increase their independence and safety with mobility to allow discharge to the venue listed below.    Knowing she is independent at baseline, I'm hopeful for good progress; will need more info re: home setup and available assist; Placed OT order for ADLs, and to add that perspective to dc planning     Follow Up Recommendations Home health PT;SNF;Other (comment) (Depending on progress; Independent at baseline, if she progresses well, can go home with possible [facility] services; if slow progress, will consider SNF)    Equipment Recommendations  Rolling walker with 5" wheels;3in1 (PT);Other (comment) (to be clarified with more infromation re: home equipment and available home assist)    Recommendations for Other Services OT consult (will order per protocol)     Precautions / Restrictions Precautions Precautions: Fall      Mobility  Bed Mobility Overal bed mobility: Needs Assistance Bed Mobility: Supine to Sit;Sit to Supine     Supine to sit: Max assist Sit to supine: Min assist   General bed  mobility comments: Pt still sleepy, so attempted to help pt [place] to EOB in an attempt to see if getting upright would help her wake up a little; Max assist to come up to sitting; Once up, pt pulled back to lay herself back down; unable to obtain sitting or sanding BPs    Transfers                 General transfer comment: Unable this morning  Ambulation/Gait             General Gait Details: Unable this morning  Stairs            Wheelchair Mobility    Modified Rankin (Stroke Patients Only)       Balance Overall balance assessment:  (to be assessed)                                           Pertinent Vitals/Pain Pain Assessment: Faces Faces Pain Scale: Hurts little more Pain Location: grimace with movement towards sitting up; difficult to tell if painful, or not wanting to get up Pain Descriptors / Indicators: Grimacing Pain Intervention(s): Repositioned    Home Living Family/patient expects to be discharged to:: Private residence Living Arrangements: Alone   Type of Home: House Home Access: Stairs to enter   [employer] of Steps:  (a few) Home Layout: One level        Prior Function Level of Independence: Independent with assistive device(s)  Comments: Per H&P, pt's neqhew indicated that she manages well independnetly with RW (I believe it might be a rollator), and she is independent with ADLs     Hand Dominance        Extremity/Trunk Assessment   Upper Extremity Assessment Upper Extremity Assessment: Difficult to assess due to impaired cognition;RUE deficits/detail RUE Deficits / Details: No voluntary movement noted while applying BP cuff to R upper arm in attempt to get Orthostatic BPs; later, noted voluntary movement to lay herself back down    Lower Extremity Assessment Lower Extremity Assessment: Generalized weakness;Difficult to assess due to impaired cognition (Still, moved LEs against gravity  well to lay herself back down)       Communication   Communication: Expressive difficulties;Other (comment) (Saw pt early in the morning, and woke her up; Initially answering "mm-hmm" to questions; Once attmept made to help her to sit up, she stated "no, I don't want to get up")  Cognition Arousal/Alertness:  (Very sleepy; had not been up yet this morning) Behavior During Therapy: Flat affect Overall Cognitive Status: Impaired/Different from baseline Area of Impairment: Attention;Following commands;Safety/judgement;Awareness                               General Comments: Per chart review, pt is managing independently at baseline; This morning, she is difficult to arouse, and not engaging in conversation      General Comments General comments (skin integrity, edema, etc.):   11/04/20 0845 11/04/20 0851  Orthostatic Lying   BP- Lying 105/73 (map 843) 131/57 (map 80)  Pulse- Lying 60 57 (After attempt to sit up and pt pushing to lay back down)      Exercises     Assessment/Plan    PT Assessment Patient needs continued PT services  PT Problem List Decreased activity tolerance;Decreased balance;Decreased mobility;Decreased cognition;Decreased knowledge of use of DME;Decreased safety awareness       PT Treatment Interventions DME instruction;Gait training;Stair training;Functional mobility training;Therapeutic activities;Therapeutic exercise;Balance training;Cognitive remediation;Patient/family education    PT Goals (Current goals can be found in the Care Plan section)  Acute Rehab PT Goals Patient Stated Goal: Did not state, though she did indicate she wanted to lay back down this session PT Goal Formulation: Patient unable to participate in goal setting Time For Goal Achievement: 11/18/20 Potential to Achieve Goals: Good    Frequency Min 3X/week   Barriers to discharge Other (comment) Will depend on her progress, and potential availabe assist at home     Co-evaluation               AM-PAC PT "6 Clicks" Mobility  Outcome Measure Help needed turning from your back to your side while in a flat bed without using bedrails?: None Help needed moving from lying on your back to sitting on the side of a flat bed without using bedrails?: A Lot Help needed moving to and from a bed to a chair (including a wheelchair)?: A Lot Help needed standing up from a chair using your arms (e.g., wheelchair or bedside chair)?: A Lot Help needed to walk in hospital room?: A Lot Help needed climbing 3-5 steps with a railing? : Total 6 Click Score: 13    End of Session Equipment Utilized During Treatment: Gait belt Activity Tolerance: Other (comment);Patient limited by lethargy (diffiulty with participating) Patient left: in bed;with call bell/phone within reach;with bed alarm set Nurse Communication: Mobility status PT Visit Diagnosis: Unsteadiness on feet (  R26.81);History of falling (Z91.81);Other abnormalities of gait and mobility (R26.89)    Time: 7628-3151 PT Time Calculation (min) (ACUTE ONLY): 16 min   Charges:   PT Evaluation $PT Eval Moderate Complexity: 1 Mod          Roney Marion, [place] Pager [phone] Office [phone]   [place] 11/04/2020, 11:11 AM

## 2020-11-04 NOTE — Progress Notes (Signed)
HOSPITAL MEDICINE OVERNIGHT EVENT NOTE    Notified by nursing that patient has become increasingly agitated throughout the evening with intermittent combativeness.  This has been associated with substantial sinus tachycardia in the 150s.  Patient is pulling at lines, has pulled off telemetry leads and is attempting to get out of bed.  Patient is not redirectable.  Patient's been placed in two-point soft restraints for protection of self and the nursing staff.  Will attempt to provide patient with a 2.5 mg dose of IV Haldol and attempt to get patient out of restraints if possible.  Vernelle Emerald  MD [facility]

## 2020-11-05 ENCOUNTER — Inpatient Hospital Stay (HOSPITAL_COMMUNITY): Payer: Medicare Other

## 2020-11-05 DIAGNOSIS — R410 Disorientation, unspecified: Secondary | ICD-10-CM

## 2020-11-05 DIAGNOSIS — R55 Syncope and collapse: Secondary | ICD-10-CM

## 2020-11-05 LAB — CBC
HCT: 36.4 % (ref 36.0–46.0)
Hemoglobin: 11.6 g/dL — ABNORMAL LOW (ref 12.0–15.0)
MCH: 26.9 pg (ref 26.0–34.0)
MCHC: 31.9 g/dL (ref 30.0–36.0)
MCV: 84.5 fL (ref 80.0–100.0)
Platelets: 169 10*3/uL (ref 150–400)
RBC: 4.31 MIL/uL (ref 3.87–5.11)
RDW: 14.5 % (ref 11.5–15.5)
WBC: 5.7 10*3/uL (ref 4.0–10.5)
nRBC: 0 % (ref 0.0–0.2)

## 2020-11-05 LAB — COMPREHENSIVE METABOLIC PANEL
ALT: 41 U/L (ref 0–44)
AST: 49 U/L — ABNORMAL HIGH (ref 15–41)
Albumin: 3.1 g/dL — ABNORMAL LOW (ref 3.5–5.0)
Alkaline Phosphatase: 42 U/L (ref 38–126)
Anion gap: 6 (ref 5–15)
BUN: 16 mg/dL (ref 8–23)
CO2: 23 mmol/L (ref 22–32)
Calcium: 9 mg/dL (ref 8.9–10.3)
Chloride: 108 mmol/L (ref 98–111)
Creatinine, Ser: 0.86 mg/dL (ref 0.44–1.00)
GFR, Estimated: >60 mL/min (ref >60)
Glucose, Bld: 103 mg/dL — ABNORMAL HIGH (ref 70–99)
Potassium: 5.3 mmol/L — ABNORMAL HIGH (ref 3.5–5.1)
Sodium: 137 mmol/L (ref 135–145)
Total Bilirubin: 1.3 mg/dL — ABNORMAL HIGH (ref 0.3–1.2)
Total Protein: 6.2 g/dL — ABNORMAL LOW (ref 6.5–8.1)

## 2020-11-05 LAB — ECHOCARDIOGRAM COMPLETE
AR max vel: 1.27 cm2
AV Area VTI: 1.48 cm2
AV Area mean vel: 1.13 cm2
AV Mean grad: 5 mmHg
AV Peak grad: 8.5 mmHg
Ao pk vel: 1.46 m/s
Area-P 1/2: 3.12 cm2
Height: 63 in
S' Lateral: 2.6 cm
Weight: 2278.67 oz

## 2020-11-05 LAB — MAGNESIUM: Magnesium: 2.1 mg/dL (ref 1.7–2.4)

## 2020-11-05 MED ORDER — METOPROLOL TARTRATE 12.5 MG HALF TABLET: 12.5000 mg | ORAL_TABLET | Freq: Two times a day (BID) | ORAL | Status: DC

## 2020-11-05 MED ORDER — METOPROLOL TARTRATE 25 MG PO TABS
25.0000 mg | ORAL_TABLET | Freq: Two times a day (BID) | ORAL | Status: DC
  Administered 2020-11-05 – 2020-11-08 (×7): 25 mg via ORAL
  Filled 2020-11-05 (×7): qty 1

## 2020-11-05 NOTE — Progress Notes (Signed)
Patient was placed in restraints overnight. Prior to obtaining order for bilateral soft wrist restraints mittens were tried and ineffective.  Patient trying to hit myself and nurse tech and would periodically kick. Patient pulling off leads trying to get out of bed.  Safety ensured.

## 2020-11-05 NOTE — Progress Notes (Signed)
MD notified of patient new Afib RVR 140s. Scheduled Metoprolol administered.

## 2020-11-05 NOTE — Progress Notes (Signed)
PROGRESS NOTE    Natalie Bullock  CHE:[id number] DOB: 27-May-1925 DOA: 11/03/2020 PCP: Seward Carol, MD    Brief Narrative:  87 y.o. female with medical history significant of HTN, B/L knee OA, CAD s/p CABG, hypothyroidism, presented with altered mental status  Assessment & Plan:   Active Problems:   Delirium   Encephalopathy  Delirium -Improved and conversant, pleasant -Clinically dehydrated on exam -Improved with IVF hydration -PT/OT consulted with recs for SNF. TOC consulted -Recheck bmet in AM  Fall -Question of syncope/near syncope given the skin tearing on the left forehead.  Check echocardiogram (most recent one in 2019 with normal) -Troponin mildly elevated probably nonspecific. -Per above, appears to be dehydrated -Therapy recs for SNF  HTN -Stable thus far - continue home BP meds  Hypothyroidism -TSH within normal limits, continue home dose of Synthroid.  Paroxysmal Afib, new diagnosis with RVR -Afib RVR noted this AM -Have started metoprolol for rate control -Will need to discuss with family risk/benefit for anticoagulation given significant fall risk. Of note, multiple large patches of ecchymosis across face and eyes at time of presentation -consider outpatient 30 day event monitor on d/c  DVT prophylaxis: Lovenox subq Code Status: Full Family Communication: Pt in room, family not at bedside  Status is: Inpatient  Remains inpatient appropriate because:Inpatient level of care appropriate due to severity of illness   Dispo: The patient is from: Home              Anticipated d/c is to: SNF              Patient currently is not medically stable to d/c.   Difficult to place patient No   Consultants:     Procedures:     Antimicrobials: Anti-infectives (From admission, onward)   None      Subjective: Conversant, pleasant. Reports feeling better but weak  Objective: Vitals:   11/04/20 1335 11/04/20 2148 11/05/20 0507 11/05/20 1241  BP:  127/67 (!) 189/85 (!) 169/73 (!) 130/54  Pulse:  98 74 67  Resp:  20  16  Temp:  98.7 F (37.1 C) 97.8 F (36.6 C) 98.3 F (36.8 C)  TempSrc:  Oral Axillary Oral  SpO2:  96% 96% 100%  Weight:      Height:        Intake/Output Summary (Last 24 hours) at 11/05/2020 1713 Last data filed at 11/05/2020 0355 Gross per 24 hour  Intake 60 ml  Output 450 ml  Net -390 ml   Filed Weights   11/03/20 0952  Weight: 64.6 kg    Examination: General exam: Conversant, in no acute distress Respiratory system: normal chest rise, clear, no audible wheezing Cardiovascular system: regular rhythm, s1-s2 Gastrointestinal system: Nondistended, nontender, pos BS Central nervous system: No seizures, no tremors Extremities: No cyanosis, no joint deformities Skin: No rashes, no pallor, multiple areas of ecchymosis over face Psychiatry: Affect normal // no auditory hallucinations   Data Reviewed: I have personally reviewed following labs and imaging studies  CBC: Recent Labs  Lab 11/03/20 0954 11/05/20 0105  WBC 8.3 5.7  NEUTROABS 7.6  --   HGB 13.4 11.6*  HCT 41.9 36.4  MCV 84.6 84.5  PLT 242 536   Basic Metabolic Panel: Recent Labs  Lab 11/03/20 0954 11/04/20 0202 11/05/20 0105  NA 137 136 137  K 4.0 3.8 5.3*  CL 103 104 108  CO2 23 25 23   GLUCOSE 129* 75 103*  BUN 13 10 16   CREATININE 0.81 0.73  0.86  CALCIUM 9.9 9.1 9.0  MG  --   --  2.1   GFR: Estimated Creatinine Clearance: 37 mL/min (by C-G formula based on SCr of 0.86 mg/dL). Liver Function Tests: Recent Labs  Lab 11/03/20 0954 11/05/20 0105  AST 22 49*  ALT 12 41  ALKPHOS 55 42  BILITOT 0.7 1.3*  PROT 7.8 6.2*  ALBUMIN 3.9 3.1*   No results for input(s): LIPASE, AMYLASE in the last 168 hours. No results for input(s): AMMONIA in the last 168 hours. Coagulation Profile: No results for input(s): INR, PROTIME in the last 168 hours. Cardiac Enzymes: Recent Labs  Lab 11/03/20 0954  CKTOTAL 375*   BNP (last 3  results) No results for input(s): PROBNP in the last 8760 hours. HbA1C: No results for input(s): HGBA1C in the last 72 hours. CBG: No results for input(s): GLUCAP in the last 168 hours. Lipid Profile: No results for input(s): CHOL, HDL, LDLCALC, TRIG, CHOLHDL, LDLDIRECT in the last 72 hours. Thyroid Function Tests: Recent Labs    11/03/20 1236  TSH 0.475  FREET4 1.27*   Anemia Panel: No results for input(s): VITAMINB12, FOLATE, FERRITIN, TIBC, IRON, RETICCTPCT in the last 72 hours. Sepsis Labs: Recent Labs  Lab 11/03/20 1005 11/03/20 1205  LATICACIDVEN 2.5* 1.4    Recent Results (from the past 240 hour(s))  Resp Panel by RT-PCR (Flu A&B, Covid) Nasopharyngeal Swab     Status: None   Collection Time: 11/03/20 11:28 AM   Specimen: Nasopharyngeal Swab; Nasopharyngeal(NP) swabs in vial transport medium  Result Value Ref Range Status   SARS Coronavirus 2 by RT PCR NEGATIVE NEGATIVE Final    Comment: (NOTE) SARS-CoV-2 target nucleic acids are NOT DETECTED.  The SARS-CoV-2 RNA is generally detectable in upper respiratory specimens during the acute phase of infection. The lowest concentration of SARS-CoV-2 viral copies this assay can detect is 138 copies/mL. A negative result does not preclude SARS-Cov-2 infection and should not be used as the sole basis for treatment or other patient management decisions. A negative result may occur with  improper specimen collection/handling, submission of specimen other than nasopharyngeal swab, presence of viral mutation(s) within the areas targeted by this assay, and inadequate number of viral copies(<138 copies/mL). A negative result must be combined with clinical observations, patient history, and epidemiological information. The expected result is Negative.  Fact Sheet for Patients:  EntrepreneurPulse.com.au  Fact Sheet for Healthcare Providers:  IncredibleEmployment.be  This test is no t yet  approved or cleared by the [place] FDA and  has been authorized for detection and/or diagnosis of SARS-CoV-2 by FDA under an Emergency Use Authorization (EUA). This EUA will remain  in effect (meaning this test can be used) for the duration of the COVID-19 declaration under Section 564(b)(1) of the Act, 21 U.S.C.section 360bbb-3(b)(1), unless the authorization is terminated  or revoked sooner.       Influenza A by PCR NEGATIVE NEGATIVE Final   Influenza B by PCR NEGATIVE NEGATIVE Final    Comment: (NOTE) The Xpert Xpress SARS-CoV-2/FLU/RSV plus assay is intended as an aid in the diagnosis of influenza from Nasopharyngeal swab specimens and should not be used as a sole basis for treatment. Nasal washings and aspirates are unacceptable for Xpert Xpress SARS-CoV-2/FLU/RSV testing.  Fact Sheet for Patients: EntrepreneurPulse.com.au  Fact Sheet for Healthcare Providers: IncredibleEmployment.be  This test is not yet approved or cleared by the [place] FDA and has been authorized for detection and/or diagnosis of SARS-CoV-2 by FDA under an Emergency  Use Authorization (EUA). This EUA will remain in effect (meaning this test can be used) for the duration of the COVID-19 declaration under Section 564(b)(1) of the Act, 21 U.S.C. section 360bbb-3(b)(1), unless the authorization is terminated or revoked.  Performed at [facility], [place] [address] [place], [place] [address]      Radiology Studies: ECHOCARDIOGRAM COMPLETE  Result Date: 11/05/2020    ECHOCARDIOGRAM REPORT   Patient Name:   Natalie Bullock Date of Exam: 11/05/2020 Medical Rec #:  [id number]    Height:       63.0 in Accession #:    [id number]   Weight:       142.4 lb Date of Birth:  15-Mar-1925   BSA:          1.674 m Patient Age:    68 years     BP:           169/73 mmHg Patient Gender: F            HR:           74 bpm. Exam Location:  Inpatient Procedure: Cardiac Doppler and  Color Doppler Indications:    Syncope  History:        Patient has prior history of Echocardiogram examinations, most                 recent 08/16/2017. CAD, Prior CABG, Arrythmias:Atrial                 Fibrillation; Risk Factors:Hypertension.  Sonographer:    Clayton Lefort RDCS (AE) Referring Phys: [id number] Thor  1. Left ventricular ejection fraction, by estimation, is 55 to 60%. The left ventricle has normal function. The left ventricle has no regional wall motion abnormalities. There is mild left ventricular hypertrophy. Left ventricular diastolic parameters are indeterminate.  2. Right ventricular systolic function is normal. The right ventricular size is normal.  3. Left atrial size was moderately dilated.  4. The mitral valve is grossly normal. Trivial mitral valve regurgitation. No evidence of mitral stenosis.  5. The aortic valve is tricuspid. There is mild calcification of the aortic valve. There is mild thickening of the aortic valve. Aortic valve regurgitation is not visualized. Mild aortic valve sclerosis is present, with no evidence of aortic valve stenosis.  6. The inferior vena cava is normal in size with greater than 50% respiratory variability, suggesting right atrial pressure of 3 mmHg. Comparison(s): No significant change from prior study. FINDINGS  Left Ventricle: Left ventricular ejection fraction, by estimation, is 55 to 60%. The left ventricle has normal function. The left ventricle has no regional wall motion abnormalities. The left ventricular internal cavity size was normal in size. There is  mild left ventricular hypertrophy. Left ventricular diastolic parameters are indeterminate. Right Ventricle: The right ventricular size is normal. Right vetricular wall thickness was not well visualized. Right ventricular systolic function is normal. Left Atrium: Left atrial size was moderately dilated. Right Atrium: Right atrial size was not well visualized. Pericardium: There is no  evidence of pericardial effusion. Mitral Valve: The mitral valve is grossly normal. There is mild thickening of the mitral valve leaflet(s). There is mild calcification of the mitral valve leaflet(s). Trivial mitral valve regurgitation. No evidence of mitral valve stenosis. Tricuspid Valve: The tricuspid valve is normal in structure. Tricuspid valve regurgitation is trivial. No evidence of tricuspid stenosis. Aortic Valve: The aortic valve is tricuspid. There is mild calcification of the aortic valve. There is  mild thickening of the aortic valve. Aortic valve regurgitation is not visualized. Mild aortic valve sclerosis is present, with no evidence of aortic valve stenosis. Aortic valve mean gradient measures 5.0 mmHg. Aortic valve peak gradient measures 8.5 mmHg. Aortic valve area, by VTI measures 1.48 cm. Pulmonic Valve: The pulmonic valve was not well visualized. Pulmonic valve regurgitation is not visualized. No evidence of pulmonic stenosis. Aorta: The aortic root, ascending aorta and aortic arch are all structurally normal, with no evidence of dilitation or obstruction. Venous: The inferior vena cava is normal in size with greater than 50% respiratory variability, suggesting right atrial pressure of 3 mmHg. IAS/Shunts: The atrial septum is grossly normal.  LEFT VENTRICLE PLAX 2D LVIDd:         4.50 cm  Diastology LVIDs:         2.60 cm  LV e' medial:    6.64 cm/s LV PW:         1.10 cm  LV E/e' medial:  10.7 LV IVS:        1.00 cm  LV e' lateral:   8.81 cm/s LVOT diam:     1.80 cm  LV E/e' lateral: 8.1 LV SV:         47 LV SV Index:   28 LVOT Area:     2.54 cm  RIGHT VENTRICLE             IVC RV Basal diam:  3.30 cm     IVC diam: 1.10 cm RV S prime:     10.80 cm/s TAPSE (M-mode): 1.4 cm LEFT ATRIUM             Index       RIGHT ATRIUM           Index LA diam:        3.80 cm 2.27 cm/m  RA Area:     26.10 cm LA Vol (A2C):   63.5 ml 37.94 ml/m RA Volume:   85.70 ml  51.20 ml/m LA Vol (A4C):   59.1 ml 35.31  ml/m LA Biplane Vol: 62.4 ml 37.28 ml/m  AORTIC VALVE AV Area (Vmax):    1.27 cm AV Area (Vmean):   1.13 cm AV Area (VTI):     1.48 cm AV Vmax:           146.00 cm/s AV Vmean:          104.000 cm/s AV VTI:            0.319 m AV Peak Grad:      8.5 mmHg AV Mean Grad:      5.0 mmHg LVOT Vmax:         73.10 cm/s LVOT Vmean:        46.300 cm/s LVOT VTI:          0.185 m LVOT/AV VTI ratio: 0.58  AORTA Ao Root diam: 3.40 cm Ao Asc diam:  3.40 cm MITRAL VALVE MV Area (PHT): 3.12 cm    SHUNTS MV Decel Time: 243 msec    Systemic VTI:  0.18 m MV E velocity: 71.10 cm/s  Systemic Diam: 1.80 cm MV A velocity: 51.80 cm/s MV E/A ratio:  1.37 Buford Dresser MD Electronically signed by Buford Dresser MD Signature Date/Time: 11/05/2020/4:07:33 PM    Final     Scheduled Meds: . amLODipine  5 mg Oral QHS  . artificial tears  1 application Both Eyes q morning  . aspirin EC  81 mg Oral Daily  .  enoxaparin (LOVENOX) injection  40 mg Subcutaneous Q24H  . hydrALAZINE  25 mg Oral Q6H  . levothyroxine  50 mcg Oral QAC breakfast  . metoprolol tartrate  25 mg Oral BID  . pravastatin  20 mg Oral q1800  . triamterene-hydrochlorothiazide  1 tablet Oral Daily  . vitamin C  500 mg Oral Daily   Continuous Infusions:   LOS: 2 days   Marylu Lund, MD [facility] Pager On Amion  If 7PM-7AM, please contact night-coverage 11/05/2020, 5:13 PM

## 2020-11-05 NOTE — Progress Notes (Signed)
Physical Therapy Treatment Patient Details Name: Natalie Bullock MRN: [id number] DOB: 07/06/1924 Today's Date: 11/05/2020    History of Present Illness Natalie Bullock is a 87 y.o. female who presented with altered mental status; with medical history significant of HTN, B/L knee OA, CAD s/p CABG, hypothyroidism    PT Comments    Pt admitted with above diagnosis. Pt was able to ambulate with heavy mod assist with RW.  Pt had episode after 80 feet of walking with period of unresponsiveness. Nephew states this is normal for pt when pt is tired.  Took BP after pt sitting in chair after walk at 87/47 with HR 64 bpm.  After 5 min in chair, BP 94/51 with HR 68 bpm.  Suspect orthostatic hypotension.  Nurse notified.  Will follow acutely.  Pt currently with functional limitations due to balance and endurance deficits. Pt will benefit from skilled PT to increase their independence and safety with mobility to allow discharge to the venue listed below.     Follow Up Recommendations  SNF     Equipment Recommendations  None recommended by PT    Recommendations for Other Services OT consult (will order per protocol)     Precautions / Restrictions Precautions Precautions: Fall Restrictions Weight Bearing Restrictions: No    Mobility  Bed Mobility               General bed mobility comments: Pt in chair on arrival    Transfers Overall transfer level: Needs assistance Equipment used: Rolling walker (2 wheeled) Transfers: Sit to/from [employer] Transfers Sit to Stand: Mod assist         General transfer comment: Mod A for sit to stand with pt leaning posteriorly needing assist for anterior pelvic tilt as well as to place hands on RW for support.  Needed incr time to get balance and incr cues to look up and tuck bottom in and even with cues pt having difficulty obtaining upright posture staying somewhat flexed.  Max tactile cues for facilitation of desired posture. Strong posterior  lean. Patient feaful of falling. Nephew present.  Ambulation/Gait Ambulation/Gait assistance: Mod assist;+2 safety/equipment Gait Distance (Feet): 80 Feet Assistive device: Rolling walker (2 wheeled) Gait Pattern/deviations: Decreased step length - left;Decreased stride length;Step-to pattern;Ataxic;Leaning posteriorly;Staggering left;Staggering right;Trunk flexed;Wide base of support     General Gait Details: Pt needs max cues and constant cues to sequence steps and rW. Pt with flexed trunk and pushes rW out too far in front of her needing cues and assist to control RW.  Pt needs cues to move left LE as she tends to leave it behind and needs to be cued 2-3 x to take a step with the left LE. Pt needing facilitation at hips for anterior lean as well and cues to look up.  Pt heavy mod assist to walk.  Close chair follow as well. Once pt walked 80 feet, she stopped and would not continue. Asked pt what was wrong.  Pt not answering therefore when PT sat pt down, noted that pt with a period of unresponsiveness for up to 5 seconds with blank stare. Nephew states this is normal for pt and this was happening at home. Took BP and BP once pt back in room was 87/47 therefore suspect orthostatic hypotension.  BP at end of session 5 min later was 94/51 with HR 68 bpm.  Pts O2 100% on RA. Nurse made aware of episode.   Stairs  Wheelchair Mobility    Modified Rankin (Stroke Patients Only)       Balance         Postural control: Posterior lean Standing balance support: Bilateral upper extremity supported;During functional activity Standing balance-Leahy Scale: Poor Standing balance comment: Reliant on external assist bilaterally.                            Cognition Arousal/Alertness: Awake/alert Behavior During Therapy: WFL for tasks assessed/performed Overall Cognitive Status: Impaired/Different from baseline Area of Impairment: Attention;Following  commands;Safety/judgement;Awareness                   Current Attention Level: Sustained   Following Commands: Follows one step commands consistently;Follows one step commands with increased time;Follows multi-step commands inconsistently Safety/Judgement: Decreased awareness of safety Awareness: Intellectual   General Comments: Patient unable to recall month or day. Able to state year.      Exercises      General Comments        Pertinent Vitals/Pain Pain Assessment: No/denies pain    Home Living                      Prior Function            PT Goals (current goals can now be found in the care plan section) Progress towards PT goals: Progressing toward goals    Frequency    Min 3X/week      PT Plan      Co-evaluation              AM-PAC PT "6 Clicks" Mobility   Outcome Measure  Help needed turning from your back to your side while in a flat bed without using bedrails?: None Help needed moving from lying on your back to sitting on the side of a flat bed without using bedrails?: A Lot Help needed moving to and from a bed to a chair (including a wheelchair)?: A Lot Help needed standing up from a chair using your arms (e.g., wheelchair or bedside chair)?: A Lot Help needed to walk in hospital room?: A Lot Help needed climbing 3-5 steps with a railing? : Total 6 Click Score: 13    End of Session Equipment Utilized During Treatment: Gait belt Activity Tolerance: Patient limited by fatigue Patient left: in chair;with call bell/phone within reach;with chair alarm set;with family/visitor present Nurse Communication: Mobility status PT Visit Diagnosis: Unsteadiness on feet (R26.81);History of falling (Z91.81);Other abnormalities of gait and mobility (R26.89)     Time: 8242-3536 PT Time Calculation (min) (ACUTE ONLY): 25 min  Charges:  $Gait Training: 23-37 mins                     Lou Irigoyen M,PT Acute Rehab  Services [phone] (pager)   Alvira Philips 11/05/2020, 3:19 PM

## 2020-11-05 NOTE — Progress Notes (Signed)
MD notified of pt's conversion from NSR to Afib. New orders received for Metoprolol.

## 2020-11-05 NOTE — Evaluation (Signed)
Occupational Therapy Evaluation Patient Details Name: Natalie Bullock MRN: [id number] DOB: 12/17/1924 Today's Date: 11/05/2020    History of Present Illness Natalie Bullock is a 87 y.o. female who presented with altered mental status; with medical history significant of HTN, B/L knee OA, CAD s/p CABG, hypothyroidism   Clinical Impression   PTA patient was living alone in a private residence with hired help for bathing at shower level and for heavy cleaning. Nephew present at time of evaluation reports patient able to sponge bathe herself and manage medications independently. Patient currently functioning below baseline demonstrating sit to stand transfers with Mod A, stand-pivot transfers with Mod A +2 and Max A to don footwear seated EOB. Patient also limited by deficits listed below including generalized weakness, decreased cognition, and decreased static/dynamic sitting/standing balance and would benefit from continued acute OT services in prep for safe d/c to next level of care. Nephew in agreement with SNF rehab. OT will continue to follow acutely.      Follow Up Recommendations  SNF;Supervision/Assistance - 24 hour    Equipment Recommendations  Other (comment) (Defer to next level of care.)    Recommendations for Other Services       Precautions / Restrictions Precautions Precautions: Fall Restrictions Weight Bearing Restrictions: No      Mobility Bed Mobility Overal bed mobility: Needs Assistance Bed Mobility: Supine to Sit     Supine to sit: Mod assist     General bed mobility comments: Mod A to initiate advancement of BLE toward EOB and to elevate trunk. Max cues for sequencing/hand placement.    Transfers Overall transfer level: Needs assistance Equipment used: 2 person hand held assist Transfers: Sit to/from [facility] Sit to Stand: Mod assist Stand pivot transfers: Mod assist;+2 physical assistance;+2 safety/equipment       General transfer  comment: Mod A for sit to stand from EOB with patient pulling up on therapists hands bilaterally despite cues for hand placement. Mod A +2 and HHA+2 for stand-pivot to recliner. Patient scissoring legs several times requiring Max cues for foot placement and sequencing of transfer. Strong posterior lean. Patient feaful of falling. Nephew present for +2 assist transfer.    Balance Overall balance assessment: Needs assistance Sitting-balance support: Bilateral upper extremity supported;Feet supported Sitting balance-Leahy Scale: Poor Sitting balance - Comments: Cues for hand placement/foot placement. Reliant on BUE on bed surface. Unable to maintain dynamic sitting balance without external assist. Postural control: Posterior lean Standing balance support: Bilateral upper extremity supported;During functional activity Standing balance-Leahy Scale: Poor Standing balance comment: Reliant on external assist bilaterally.                           ADL either performed or assessed with clinical judgement   ADL Overall ADL's : Needs assistance/impaired     Grooming: Minimal assistance;Sitting               Lower Body Dressing: Maximal assistance;Sit to/from stand Lower Body Dressing Details (indicate cue type and reason): Max A to don footwear seated EOB. Toilet Transfer: Moderate assistance;+2 for physical assistance;+2 for safety/equipment Toilet Transfer Details (indicate cue type and reason): Simulated with transfer to recliner with Mod A +2 via HHA.         Functional mobility during ADLs:  (Unable to safety ambulate this date) General ADL Comments: Patient limited by decreased sitting/standing balance, generalized weakness, decreased cognition and difficutly ambulating without +2 assist.  Vision Patient Visual Report: No change from baseline       Perception     Praxis      Pertinent Vitals/Pain Pain Assessment: Faces Faces Pain Scale: Hurts little more Pain  Location: L arm 2/2 blood draws Pain Descriptors / Indicators: Grimacing;Sore Pain Intervention(s): Monitored during session     Hand Dominance Right   Extremity/Trunk Assessment Upper Extremity Assessment Upper Extremity Assessment: Generalized weakness   Lower Extremity Assessment Lower Extremity Assessment: Generalized weakness       Communication Communication Communication: No difficulties   Cognition Arousal/Alertness: Awake/alert Behavior During Therapy: WFL for tasks assessed/performed Overall Cognitive Status: Impaired/Different from baseline Area of Impairment: Attention;Following commands;Safety/judgement;Awareness                   Current Attention Level: Sustained   Following Commands: Follows one step commands consistently;Follows one step commands with increased time;Follows multi-step commands inconsistently Safety/Judgement: Decreased awareness of safety Awareness: Intellectual   General Comments: Patient unable to recall month or day. Able to state year. Reports thinking she was at the grocery store. Nephew present at bedside reports significant cognitive decline compared to PTA.   General Comments  Nephew present at bedside at time of evaluation.    Exercises     Shoulder Instructions      Home Living Family/patient expects to be discharged to:: Skilled nursing facility Living Arrangements: Alone Available Help at Discharge: Family;Available PRN/intermittently Type of Home: House Home Access: Stairs to enter [employer] of Steps:  (a few)   Home Layout: One level               Home Equipment: Walker - 4 wheels          Prior Functioning/Environment Level of Independence: Independent with assistive device(s)        Comments: Mod I with ADLs with use of rollator; manages meds independently; has a private pay PCA 5x week that assists with "thorough bathing" and hired assist for deep cleaning of home 1x monthly.  Patient reads newspaper daily.        OT Problem List: Decreased strength;Decreased activity tolerance;Impaired balance (sitting and/or standing);Decreased range of motion;Decreased cognition;Decreased safety awareness;Decreased knowledge of use of DME or AE;Increased edema      OT Treatment/Interventions: Self-care/ADL training;Therapeutic exercise;Energy conservation;DME and/or AE instruction;Therapeutic activities;Patient/family education;Balance training    OT Goals(Current goals can be found in the care plan section) Acute Rehab OT Goals Patient Stated Goal: Per nephew; for patient to d/c to SNF rehab. OT Goal Formulation: With patient/family Time For Goal Achievement: 11/19/20 Potential to Achieve Goals: Good ADL Goals Pt Will Perform Grooming: with set-up;sitting Pt Will Perform Upper Body Dressing: with set-up;sitting Pt Will Perform Lower Body Dressing: with min assist;sit to/from stand Pt Will Transfer to Toilet: with min assist;ambulating Pt Will Perform Toileting - Clothing Manipulation and hygiene: with min assist;sit to/from stand;sitting/lateral leans Pt/caregiver will Perform Home Exercise Program: Increased strength;Increased ROM;Both right and left upper extremity;With written HEP provided;With Supervision  OT Frequency: Min 2X/week   Barriers to D/C: Decreased caregiver support  Lives alone       Co-evaluation              AM-PAC OT "6 Clicks" Daily Activity     Outcome Measure Help from another person eating meals?: A Little Help from another person taking care of personal grooming?: A Little Help from another person toileting, which includes using toliet, bedpan, or urinal?: Total Help from another person bathing (including  washing, rinsing, drying)?: A Lot Help from another person to put on and taking off regular upper body clothing?: A Little Help from another person to put on and taking off regular lower body clothing?: A Lot 6 Click Score: 14    End of Session Equipment Utilized During Treatment: Gait belt  Activity Tolerance: Patient tolerated treatment well Patient left: in chair;with call bell/phone within reach;with chair alarm set  OT Visit Diagnosis: Unsteadiness on feet (R26.81);Muscle weakness (generalized) (M62.81);Other symptoms and signs involving cognitive function                Time: 1740-9927 OT Time Calculation (min): 24 min Charges:  OT General Charges $OT Visit: 1 Visit OT Evaluation $OT Eval Moderate Complexity: 1 Mod OT Treatments $Self Care/Home Management : 8-22 mins  Killian Schwer H. OTR/L Supplemental OT, Department of rehab services [phone]  Mahir Prabhakar R H. 11/05/2020, 10:39 AM

## 2020-11-05 NOTE — Progress Notes (Signed)
  Echocardiogram 2D Echocardiogram has been performed.  ED RAYSON 11/05/2020, 2:59 PM

## 2020-11-05 NOTE — Progress Notes (Signed)
Echo attempted. Patient in chair eating. Will attempt again later as time permits.

## 2020-11-06 LAB — COMPREHENSIVE METABOLIC PANEL
ALT: 20 U/L (ref 0–44)
AST: 34 U/L (ref 15–41)
Albumin: 3.5 g/dL (ref 3.5–5.0)
Alkaline Phosphatase: 49 U/L (ref 38–126)
Anion gap: 9 (ref 5–15)
BUN: 16 mg/dL (ref 8–23)
CO2: 22 mmol/L (ref 22–32)
Calcium: 9.5 mg/dL (ref 8.9–10.3)
Chloride: 106 mmol/L (ref 98–111)
Creatinine, Ser: 0.8 mg/dL (ref 0.44–1.00)
GFR, Estimated: >60 mL/min (ref >60)
Glucose, Bld: 94 mg/dL (ref 70–99)
Potassium: 3.7 mmol/L (ref 3.5–5.1)
Sodium: 137 mmol/L (ref 135–145)
Total Bilirubin: 0.7 mg/dL (ref 0.3–1.2)
Total Protein: 6.8 g/dL (ref 6.5–8.1)

## 2020-11-06 LAB — CBC
HCT: 38.6 % (ref 36.0–46.0)
Hemoglobin: 12.3 g/dL (ref 12.0–15.0)
MCH: 27 pg (ref 26.0–34.0)
MCHC: 31.9 g/dL (ref 30.0–36.0)
MCV: 84.8 fL (ref 80.0–100.0)
Platelets: 233 10*3/uL (ref 150–400)
RBC: 4.55 MIL/uL (ref 3.87–5.11)
RDW: 14.5 % (ref 11.5–15.5)
WBC: 6.2 10*3/uL (ref 4.0–10.5)
nRBC: 0 % (ref 0.0–0.2)

## 2020-11-06 LAB — MAGNESIUM: Magnesium: 2.1 mg/dL (ref 1.7–2.4)

## 2020-11-06 MED ORDER — HALOPERIDOL LACTATE 5 MG/ML IJ SOLN
2.0000 mg | Freq: Four times a day (QID) | INTRAMUSCULAR | Status: DC | PRN
  Administered 2020-11-06: 2 mg via INTRAVENOUS
  Filled 2020-11-06: qty 1

## 2020-11-06 MED ORDER — METOPROLOL TARTRATE 5 MG/5ML IV SOLN
5.0000 mg | Freq: Once | INTRAVENOUS | Status: AC
  Administered 2020-11-06: 5 mg via INTRAVENOUS
  Filled 2020-11-06: qty 5

## 2020-11-06 NOTE — Progress Notes (Signed)
PROGRESS NOTE    Natalie Bullock  BCW:[id number] DOB: 03/28/1925 DOA: 11/03/2020 PCP: Seward Carol, MD    Brief Narrative:  87 y.o. female with medical history significant of HTN, B/L knee OA, CAD s/p CABG, hypothyroidism, presented with altered mental status  Assessment & Plan:   Active Problems:   Delirium   Encephalopathy  Delirium -Improved and conversant, pleasant -Clinically dehydrated on exam -Improved with IVF hydration -PT/OT consulted with recs for SNF. TOC following -Repeat bmet in AM  Fall -Question of syncope/near syncope given the skin tearing on the left forehead.  Check echocardiogram (most recent one in 2019 with normal) -Troponin mildly elevated probably nonspecific. -SNF recommended by therapy  HTN -Stable thus far - continue home BP meds  Hypothyroidism -TSH within normal limits, continue home dose of Synthroid.  Paroxysmal Afib, new diagnosis with RVR -Afib RVR noted this AM -continue metoprolol for rate control -Will need to discuss with family risk/benefit for anticoagulation given significant fall risk. Of note, multiple large patches of ecchymosis across face and eyes at time of presentation  DVT prophylaxis: Lovenox subq Code Status: Full Family Communication: Pt in room, family not at bedside  Status is: Inpatient  Remains inpatient appropriate because:Inpatient level of care appropriate due to severity of illness   Dispo: The patient is from: Home              Anticipated d/c is to: SNF              Patient currently is not medically stable to d/c.   Difficult to place patient No   Consultants:     Procedures:     Antimicrobials: Anti-infectives (From admission, onward)   None      Subjective: Pleasantly confused this AM.  Objective: Vitals:   11/06/20 0441 11/06/20 0800 11/06/20 1100 11/06/20 1138  BP: (!) 148/67 (!) 148/67 131/64 132/77  Pulse: 64 65 66 (!) 59  Resp: 16 18  18   Temp: 97.9 F (36.6 C) 97.8  F (36.6 C)  98.7 F (37.1 C)  TempSrc:  Oral  Oral  SpO2: 100% 100%  100%  Weight:      Height:        Intake/Output Summary (Last 24 hours) at 11/06/2020 1519 Last data filed at 11/05/2020 1935 Gross per 24 hour  Intake --  Output 600 ml  Net -600 ml   Filed Weights   11/03/20 0952  Weight: 64.6 kg    Examination: General exam: Conversant, in no acute distress Respiratory system: normal chest rise, clear, no audible wheezing Cardiovascular system: regular rhythm, s1-s2 Gastrointestinal system: Nondistended, nontender, pos BS Central nervous system: No seizures, no tremors Extremities: No cyanosis, no joint deformities Skin: No rashes, no pallor Psychiatry: Affect normal // no auditory hallucinations   Data Reviewed: I have personally reviewed following labs and imaging studies  CBC: Recent Labs  Lab 11/03/20 0954 11/05/20 0105 11/06/20 0436  WBC 8.3 5.7 6.2  NEUTROABS 7.6  --   --   HGB 13.4 11.6* 12.3  HCT 41.9 36.4 38.6  MCV 84.6 84.5 84.8  PLT 242 169 038   Basic Metabolic Panel: Recent Labs  Lab 11/03/20 0954 11/04/20 0202 11/05/20 0105 11/06/20 0436  NA 137 136 137 137  K 4.0 3.8 5.3* 3.7  CL 103 104 108 106  CO2 23 25 23 22   GLUCOSE 129* 75 103* 94  BUN 13 10 16 16   CREATININE 0.81 0.73 0.86 0.80  CALCIUM 9.9 9.1 9.0  9.5  MG  --   --  2.1 2.1   GFR: Estimated Creatinine Clearance: 39.7 mL/min (by C-G formula based on SCr of 0.8 mg/dL). Liver Function Tests: Recent Labs  Lab 11/03/20 0954 11/05/20 0105 11/06/20 0436  AST 22 49* 34  ALT 12 41 20  ALKPHOS 55 42 49  BILITOT 0.7 1.3* 0.7  PROT 7.8 6.2* 6.8  ALBUMIN 3.9 3.1* 3.5   No results for input(s): LIPASE, AMYLASE in the last 168 hours. No results for input(s): AMMONIA in the last 168 hours. Coagulation Profile: No results for input(s): INR, PROTIME in the last 168 hours. Cardiac Enzymes: Recent Labs  Lab 11/03/20 0954  CKTOTAL 375*   BNP (last 3 results) No results for  input(s): PROBNP in the last 8760 hours. HbA1C: No results for input(s): HGBA1C in the last 72 hours. CBG: No results for input(s): GLUCAP in the last 168 hours. Lipid Profile: No results for input(s): CHOL, HDL, LDLCALC, TRIG, CHOLHDL, LDLDIRECT in the last 72 hours. Thyroid Function Tests: No results for input(s): TSH, T4TOTAL, FREET4, T3FREE, THYROIDAB in the last 72 hours. Anemia Panel: No results for input(s): VITAMINB12, FOLATE, FERRITIN, TIBC, IRON, RETICCTPCT in the last 72 hours. Sepsis Labs: Recent Labs  Lab 11/03/20 1005 11/03/20 1205  LATICACIDVEN 2.5* 1.4    Recent Results (from the past 240 hour(s))  Resp Panel by RT-PCR (Flu A&B, Covid) Nasopharyngeal Swab     Status: None   Collection Time: 11/03/20 11:28 AM   Specimen: Nasopharyngeal Swab; Nasopharyngeal(NP) swabs in vial transport medium  Result Value Ref Range Status   SARS Coronavirus 2 by RT PCR NEGATIVE NEGATIVE Final    Comment: (NOTE) SARS-CoV-2 target nucleic acids are NOT DETECTED.  The SARS-CoV-2 RNA is generally detectable in upper respiratory specimens during the acute phase of infection. The lowest concentration of SARS-CoV-2 viral copies this assay can detect is 138 copies/mL. A negative result does not preclude SARS-Cov-2 infection and should not be used as the sole basis for treatment or other patient management decisions. A negative result may occur with  improper specimen collection/handling, submission of specimen other than nasopharyngeal swab, presence of viral mutation(s) within the areas targeted by this assay, and inadequate number of viral copies(<138 copies/mL). A negative result must be combined with clinical observations, patient history, and epidemiological information. The expected result is Negative.  Fact Sheet for Patients:  EntrepreneurPulse.com.au  Fact Sheet for Healthcare Providers:  IncredibleEmployment.be  This test is no t yet  approved or cleared by the [place] FDA and  has been authorized for detection and/or diagnosis of SARS-CoV-2 by FDA under an Emergency Use Authorization (EUA). This EUA will remain  in effect (meaning this test can be used) for the duration of the COVID-19 declaration under Section 564(b)(1) of the Act, 21 U.S.C.section 360bbb-3(b)(1), unless the authorization is terminated  or revoked sooner.       Influenza A by PCR NEGATIVE NEGATIVE Final   Influenza B by PCR NEGATIVE NEGATIVE Final    Comment: (NOTE) The Xpert Xpress SARS-CoV-2/FLU/RSV plus assay is intended as an aid in the diagnosis of influenza from Nasopharyngeal swab specimens and should not be used as a sole basis for treatment. Nasal washings and aspirates are unacceptable for Xpert Xpress SARS-CoV-2/FLU/RSV testing.  Fact Sheet for Patients: EntrepreneurPulse.com.au  Fact Sheet for Healthcare Providers: IncredibleEmployment.be  This test is not yet approved or cleared by the [place] FDA and has been authorized for detection and/or diagnosis of SARS-CoV-2 by FDA  under an Emergency Use Authorization (EUA). This EUA will remain in effect (meaning this test can be used) for the duration of the COVID-19 declaration under Section 564(b)(1) of the Act, 21 U.S.C. section 360bbb-3(b)(1), unless the authorization is terminated or revoked.  Performed at [facility], [place] [address], [place], [place] [address]      Radiology Studies: ECHOCARDIOGRAM COMPLETE  Result Date: 11/05/2020    ECHOCARDIOGRAM REPORT   Patient Name:   Natalie Bullock Date of Exam: 11/05/2020 Medical Rec #:  [id number]    Height:       63.0 in Accession #:    [id number]   Weight:       142.4 lb Date of Birth:  1925-05-22   BSA:          1.674 m Patient Age:    48 years     BP:           169/73 mmHg Patient Gender: F            HR:           74 bpm. Exam Location:  Inpatient Procedure: Cardiac Doppler and  Color Doppler Indications:    Syncope  History:        Patient has prior history of Echocardiogram examinations, most                 recent 08/16/2017. CAD, Prior CABG, Arrythmias:Atrial                 Fibrillation; Risk Factors:Hypertension.  Sonographer:    Clayton Lefort RDCS (AE) Referring Phys: [id number] [place]  1. Left ventricular ejection fraction, by estimation, is 55 to 60%. The left ventricle has normal function. The left ventricle has no regional wall motion abnormalities. There is mild left ventricular hypertrophy. Left ventricular diastolic parameters are indeterminate.  2. Right ventricular systolic function is normal. The right ventricular size is normal.  3. Left atrial size was moderately dilated.  4. The mitral valve is grossly normal. Trivial mitral valve regurgitation. No evidence of mitral stenosis.  5. The aortic valve is tricuspid. There is mild calcification of the aortic valve. There is mild thickening of the aortic valve. Aortic valve regurgitation is not visualized. Mild aortic valve sclerosis is present, with no evidence of aortic valve stenosis.  6. The inferior vena cava is normal in size with greater than 50% respiratory variability, suggesting right atrial pressure of 3 mmHg. Comparison(s): No significant change from prior study. FINDINGS  Left Ventricle: Left ventricular ejection fraction, by estimation, is 55 to 60%. The left ventricle has normal function. The left ventricle has no regional wall motion abnormalities. The left ventricular internal cavity size was normal in size. There is  mild left ventricular hypertrophy. Left ventricular diastolic parameters are indeterminate. Right Ventricle: The right ventricular size is normal. Right vetricular wall thickness was not well visualized. Right ventricular systolic function is normal. Left Atrium: Left atrial size was moderately dilated. Right Atrium: Right atrial size was not well visualized. Pericardium: There is no  evidence of pericardial effusion. Mitral Valve: The mitral valve is grossly normal. There is mild thickening of the mitral valve leaflet(s). There is mild calcification of the mitral valve leaflet(s). Trivial mitral valve regurgitation. No evidence of mitral valve stenosis. Tricuspid Valve: The tricuspid valve is normal in structure. Tricuspid valve regurgitation is trivial. No evidence of tricuspid stenosis. Aortic Valve: The aortic valve is tricuspid. There is mild calcification of the aortic  valve. There is mild thickening of the aortic valve. Aortic valve regurgitation is not visualized. Mild aortic valve sclerosis is present, with no evidence of aortic valve stenosis. Aortic valve mean gradient measures 5.0 mmHg. Aortic valve peak gradient measures 8.5 mmHg. Aortic valve area, by VTI measures 1.48 cm. Pulmonic Valve: The pulmonic valve was not well visualized. Pulmonic valve regurgitation is not visualized. No evidence of pulmonic stenosis. Aorta: The aortic root, ascending aorta and aortic arch are all structurally normal, with no evidence of dilitation or obstruction. Venous: The inferior vena cava is normal in size with greater than 50% respiratory variability, suggesting right atrial pressure of 3 mmHg. IAS/Shunts: The atrial septum is grossly normal.  LEFT VENTRICLE PLAX 2D LVIDd:         4.50 cm  Diastology LVIDs:         2.60 cm  LV e' medial:    6.64 cm/s LV PW:         1.10 cm  LV E/e' medial:  10.7 LV IVS:        1.00 cm  LV e' lateral:   8.81 cm/s LVOT diam:     1.80 cm  LV E/e' lateral: 8.1 LV SV:         47 LV SV Index:   28 LVOT Area:     2.54 cm  RIGHT VENTRICLE             IVC RV Basal diam:  3.30 cm     IVC diam: 1.10 cm RV S prime:     10.80 cm/s TAPSE (M-mode): 1.4 cm LEFT ATRIUM             Index       RIGHT ATRIUM           Index LA diam:        3.80 cm 2.27 cm/m  RA Area:     26.10 cm LA Vol (A2C):   63.5 ml 37.94 ml/m RA Volume:   85.70 ml  51.20 ml/m LA Vol (A4C):   59.1 ml 35.31  ml/m LA Biplane Vol: 62.4 ml 37.28 ml/m  AORTIC VALVE AV Area (Vmax):    1.27 cm AV Area (Vmean):   1.13 cm AV Area (VTI):     1.48 cm AV Vmax:           146.00 cm/s AV Vmean:          104.000 cm/s AV VTI:            0.319 m AV Peak Grad:      8.5 mmHg AV Mean Grad:      5.0 mmHg LVOT Vmax:         73.10 cm/s LVOT Vmean:        46.300 cm/s LVOT VTI:          0.185 m LVOT/AV VTI ratio: 0.58  AORTA Ao Root diam: 3.40 cm Ao Asc diam:  3.40 cm MITRAL VALVE MV Area (PHT): 3.12 cm    SHUNTS MV Decel Time: 243 msec    Systemic VTI:  0.18 m MV E velocity: 71.10 cm/s  Systemic Diam: 1.80 cm MV A velocity: 51.80 cm/s MV E/A ratio:  1.37 Buford Dresser MD Electronically signed by Buford Dresser MD Signature Date/Time: 11/05/2020/4:07:33 PM    Final     Scheduled Meds: . amLODipine  5 mg Oral QHS  . artificial tears  1 application Both Eyes q morning  . ascorbic acid  500 mg  Oral Daily  . aspirin EC  81 mg Oral Daily  . enoxaparin (LOVENOX) injection  40 mg Subcutaneous Q24H  . levothyroxine  50 mcg Oral QAC breakfast  . metoprolol tartrate  25 mg Oral BID  . pravastatin  20 mg Oral q1800  . triamterene-hydrochlorothiazide  1 tablet Oral Daily   Continuous Infusions:   LOS: 3 days   Marylu Lund, MD [facility] Pager On Amion  If 7PM-7AM, please contact night-coverage 11/06/2020, 3:19 PM

## 2020-11-06 NOTE — Progress Notes (Addendum)
HOSPITAL MEDICINE OVERNIGHT EVENT NOTE    Nursing reports the patient has become increasingly agitated throughout the night.  Within the past 30 minutes, patient has exhibited extreme tachycardia associated with her agitation.  Personally reviewed the patient's telemetry, patient appears to be exhibiting sinus tachycardia with heart rates in excess of 150 bpm.  Per my discussion with nursing, it turns out that patient's evening metoprolol dose was dropped by the patient during the evening med Pass last night and was not administered.  Considering this piece of information we will go ahead and administer 5 mg of intravenous metoprolol now.  Will reassess patient after administration of metoprolol and obtain twelve-lead EKG.  If patient continues to exhibit extreme agitation exacerbating her heart rate, will consider administration of an anxiolytic.   Vernelle Emerald  MD [facility]

## 2020-11-06 NOTE — Care Management Important Message (Signed)
Important Message  Patient Details  Name: Natalie Bullock MRN: [id number] Date of Birth: 03-Sep-1924   Medicare Important Message Given:  Yes     Orbie Pyo 11/06/2020, 2:29 PM

## 2020-11-06 NOTE — NC FL2 (Signed)
[place] LEVEL OF CARE SCREENING TOOL     IDENTIFICATION  Patient Name: Natalie Bullock Birthdate: February 07, 1925 Sex: female Admission Date (Current Location): 11/03/2020  [facility] and [place] Number:  Herbalist and Address:  [facility] [place], [place] [address] [place], [place] [address]      Provider Number: [id number]  Attending Physician Name and Address:  Donne Hazel, MD  Relative Name and Phone Number:       Current Level of Care: Hospital Recommended Level of Care: [place] Prior Approval Number:    Date Approved/Denied:   PASRR Number: [id number]  Discharge Plan: SNF    Current Diagnoses: Patient Active Problem List   Diagnosis Date Noted  . Encephalopathy 11/03/2020  . Fall   . Orthostasis   . Mitral regurgitation 03/07/2014  . Near syncope 03/26/2013  . Syncope 05/19/2012  . Delirium 09/12/2011  . HTN (hypertension) 08/17/2011  . HX: breast cancer 08/14/2011  . Hypothyroidism 08/14/2011  . Hx of CABG 11/19/2010  . Hyperlipidemia 11/19/2010    Orientation RESPIRATION BLADDER Height & Weight     Self,Time,Situation,Place  Normal Incontinent Weight: 142 lb 6.7 oz (64.6 kg) Height:  5\' 3"  (160 cm)  BEHAVIORAL SYMPTOMS/MOOD NEUROLOGICAL BOWEL NUTRITION STATUS      Continent Diet (heart healthy)  AMBULATORY STATUS COMMUNICATION OF NEEDS Skin   Extensive Assist Verbally Bruising                       Personal Care Assistance Level of Assistance  Bathing,Feeding,Dressing Bathing Assistance: Maximum assistance Feeding assistance: Limited assistance Dressing Assistance: Maximum assistance     Functional Limitations Info             SPECIAL CARE FACTORS FREQUENCY  PT (By licensed PT),OT (By licensed OT)     PT Frequency: 5x/wk OT Frequency: 5x/wk            Contractures Contractures Info: Not present    Additional Factors Info  Code Status,Allergies Code Status Info:  Full Allergies Info: Valium, Codeine, Crestor (Rosuvastatin Calcium), Doxycycline, Ezetimibe-simvastatin, Lipitor (Atorvastatin Calcium), Sulfa Drugs Cross Reactors, Tequin, Zocor (Simvastatin)           Current Medications (11/06/2020):  This is the current hospital active medication list Current Facility-Administered Medications  Medication Dose Route Frequency Provider Last Rate Last Admin  . acetaminophen (TYLENOL) tablet 650 mg  650 mg Oral Q6H PRN Wynetta Fines T, MD   650 mg at 11/04/20 0546   Or  . acetaminophen (TYLENOL) suppository 650 mg  650 mg Rectal Q6H PRN Wynetta Fines T, MD      . amLODipine (NORVASC) tablet 5 mg  5 mg Oral QHS Wynetta Fines T, MD   5 mg at 11/05/20 2216  . artificial tears (LACRILUBE) ophthalmic ointment 1 application  1 application Both Eyes q morning Lequita Halt, MD   1 application at 46/50/35 1102  . ascorbic acid (VITAMIN C) tablet 500 mg  500 mg Oral Daily Wynetta Fines T, MD   500 mg at 11/06/20 1100  . aspirin EC tablet 81 mg  81 mg Oral Daily Wynetta Fines T, MD   81 mg at 11/06/20 1100  . enoxaparin (LOVENOX) injection 40 mg  40 mg Subcutaneous Q24H Wilson Singer I, RPH   40 mg at 11/05/20 1737  . hydrALAZINE (APRESOLINE) injection 5 mg  5 mg Intravenous Q6H PRN Lequita Halt, MD   5 mg  at 11/03/20 1829  . levothyroxine (SYNTHROID) tablet 50 mcg  50 mcg Oral QAC breakfast Wynetta Fines T, MD   50 mcg at 11/06/20 0511  . metoprolol tartrate (LOPRESSOR) tablet 25 mg  25 mg Oral BID Donne Hazel, MD   25 mg at 11/06/20 1100  . ondansetron (ZOFRAN) tablet 4 mg  4 mg Oral Q6H PRN Wynetta Fines T, MD       Or  . ondansetron [facility]) injection 4 mg  4 mg Intravenous Q6H PRN Wynetta Fines T, MD   4 mg at 11/03/20 2130  . pravastatin (PRAVACHOL) tablet 20 mg  20 mg Oral q1800 Wynetta Fines T, MD   20 mg at 11/05/20 1735  . senna-docusate (Senokot-S) tablet 1 tablet  1 tablet Oral QHS PRN Wynetta Fines T, MD      . triamterene-hydrochlorothiazide [facility]) 37.5-25  MG per tablet 1 tablet  1 tablet Oral Daily Lequita Halt, MD   1 tablet at 11/06/20 1100     Discharge Medications: Please see discharge summary for a list of discharge medications.  Relevant Imaging Results:  Relevant Lab Results:   Additional Information SS#: [id number]  Geralynn Ochs, LCSW

## 2020-11-06 NOTE — Progress Notes (Signed)
Physical Therapy Treatment Patient Details Name: Natalie Bullock MRN: [id number] DOB: Mar 11, 1925 Today's Date: 11/06/2020    History of Present Illness Natalie Bullock is a 87 y.o. female who presented with altered mental status; with medical history significant of HTN, B/L knee OA, CAD s/p CABG, hypothyroidism    PT Comments    Patient received sitting in recliner talking about people she sees outside and that she called police about it. Very difficult to re-direct. Attempted to get patient to stand to re-position in recliner as she had slid to edge, but she was unable. Required +2 max assist for scooting back into recliner in seated position using pad. Patient is unable to stay on task and follow direction well for safe mobility this session. She will continue to benefit from skilled PT while here to improve functional independence and safety.         Follow Up Recommendations  SNF;Supervision/Assistance - 24 hour     Equipment Recommendations  Other (comment) (TBD)    Recommendations for Other Services       Precautions / Restrictions Precautions Precautions: Fall Restrictions Weight Bearing Restrictions: No    Mobility  Bed Mobility               General bed mobility comments: Pt in chair on arrival    Transfers Overall transfer level: Needs assistance Equipment used: None Transfers: Sit to/from Stand Sit to Stand: Max assist;+2 physical assistance         General transfer comment: attempted to get patient to stand to reposition in recliner as she was received almost having slid out of recliner. Unable to stand or scoot back into recliner without max +2 assist.  Ambulation/Gait             General Gait Details: unable to attempt ambulation this session due to patient with difficulty attending to task and following direction. Would not be safe.   Stairs             Wheelchair Mobility    Modified Rankin (Stroke Patients Only)       Balance  Overall balance assessment: Needs assistance Sitting-balance support: Feet supported Sitting balance-Leahy Scale: Poor Sitting balance - Comments: Requires max +2 for leaning forward in chair and scooting bottom back into recliner.                                    Cognition Arousal/Alertness: Awake/alert Behavior During Therapy: WFL for tasks assessed/performed Overall Cognitive Status: Impaired/Different from baseline Area of Impairment: Orientation;Attention;Following commands;Safety/judgement;Awareness;Problem solving                 Orientation Level: Disoriented to;Place;Time;Situation Current Attention Level: Sustained   Following Commands: Follows one step commands inconsistently;Follows one step commands with increased time Safety/Judgement: Decreased awareness of safety;Decreased awareness of deficits Awareness: Intellectual Problem Solving: Requires verbal cues;Requires tactile cues;Difficulty sequencing General Comments: Patient is having visual halucinations about seeing people outside her window and coming to her room. Difficult to re-direct this session.      Exercises Other Exercises Other Exercises: AP with cues, x 5 reps. Unable to follow direction for further exercises    General Comments        Pertinent Vitals/Pain Pain Location: Patient reports pain in knees    Home Living  Prior Function            PT Goals (current goals can now be found in the care plan section) Acute Rehab PT Goals Patient Stated Goal: Per nephew; for patient to d/c to SNF rehab. PT Goal Formulation: Patient unable to participate in goal setting Time For Goal Achievement: 11/18/20 Potential to Achieve Goals: Good Progress towards PT goals: Not progressing toward goals - comment (patient with increased confusion and halucinations this visit.)    Frequency    Min 3X/week      PT Plan Current plan remains appropriate     Co-evaluation              AM-PAC PT "6 Clicks" Mobility   Outcome Measure  Help needed turning from your back to your side while in a flat bed without using bedrails?: A Little Help needed moving from lying on your back to sitting on the side of a flat bed without using bedrails?: A Little Help needed moving to and from a bed to a chair (including a wheelchair)?: A Lot Help needed standing up from a chair using your arms (e.g., wheelchair or bedside chair)?: A Lot Help needed to walk in hospital room?: Total Help needed climbing 3-5 steps with a railing? : Total 6 Click Score: 12    End of Session   Activity Tolerance: Other (comment) (limited by confusion) Patient left: in chair;with call bell/phone within reach;with chair alarm set Nurse Communication: Mobility status PT Visit Diagnosis: Other abnormalities of gait and mobility (R26.89);History of falling (Z91.81)     Time: 1016-1030 PT Time Calculation (min) (ACUTE ONLY): 14 min  Charges:  $Therapeutic Activity: 8-22 mins                     Zollie Clemence, PT, GCS 11/06/20,10:44 AM

## 2020-11-06 NOTE — TOC Initial Note (Signed)
Transition of Care [facility]) - Initial/Assessment Note    Patient Details  Name: Natalie Bullock MRN: [id number] Date of Birth: 03-03-1925  Transition of Care [facility]) CM/SW Contact:    Geralynn Ochs, LCSW Phone Number: 11/06/2020, 2:57 PM  Clinical Narrative:     CSW spoke with patient's nephew, [name], over the phone to discuss SNF. Mark in agreement with SNF, aware that she cannot go back home in this state and has concern on if she'll be able to go back home at all. CSW provided bed offers to [place] and he will review options available and get back to CSW with choice. CSW to follow.           Expected Discharge Plan: Skilled Nursing Facility Barriers to Discharge: Continued Medical Work up   Patient Goals and CMS Choice Patient states their goals for this hospitalization and ongoing recovery are:: hopefully to get back home CMS Medicare.gov Compare Post Acute Care list provided to:: Patient Represenative (must comment) Choice offered to / list presented to :  (nephew)  Expected Discharge Plan and Services Expected Discharge Plan: [place] Choice: [place] Living arrangements for the past 2 months: Single Family Home                                      Prior Living Arrangements/Services Living arrangements for the past 2 months: Single Family Home Lives with:: Self Patient language and need for interpreter reviewed:: No Do you feel safe going back to the place where you live?: Yes      Need for Family Participation in Patient Care: Yes (Comment) Care giver support system in place?: No (comment) Current home services: DME Criminal Activity/Legal Involvement Pertinent to Current Situation/Hospitalization: No - Comment as needed  Activities of Daily Living      Permission Sought/Granted Permission sought to share information with : Facility Retail banker granted to share  information with : Yes, Verbal Permission Granted  Share Information with NAME: [name]  Permission granted to share info w AGENCY: SNF  Permission granted to share info w Relationship: Nephew     Emotional Assessment Appearance:: Appears stated age Attitude/Demeanor/Rapport: Unable to Assess Affect (typically observed): Unable to Assess Orientation: : Oriented to Self,Oriented to Place Alcohol / Substance Use: Not Applicable Psych Involvement: No (comment)  Admission diagnosis:  Dehydration [E86.0] Encephalopathy [G93.40] Fall, initial encounter [W19.XXXA] Patient Active Problem List   Diagnosis Date Noted  . Encephalopathy 11/03/2020  . Fall   . Orthostasis   . Mitral regurgitation 03/07/2014  . Near syncope 03/26/2013  . Syncope 05/19/2012  . Delirium 09/12/2011  . HTN (hypertension) 08/17/2011  . HX: breast cancer 08/14/2011  . Hypothyroidism 08/14/2011  . Hx of CABG 11/19/2010  . Hyperlipidemia 11/19/2010   PCP:  Seward Carol, MD Pharmacy:   [facility], [place] [address] [place] [address] Phone: [phone] Fax: [phone]  [id number] [address] [place] [address] Phone: [phone] Fax: [phone]     Social Determinants of Health (SDOH) Interventions    Readmission Risk Interventions No flowsheet data found.

## 2020-11-06 NOTE — Patient Care Conference (Signed)
Called all contacts listed to give update. No answer.

## 2020-11-07 LAB — COMPREHENSIVE METABOLIC PANEL
ALT: 25 U/L (ref 0–44)
AST: 37 U/L (ref 15–41)
Albumin: 3.4 g/dL — ABNORMAL LOW (ref 3.5–5.0)
Alkaline Phosphatase: 41 U/L (ref 38–126)
Anion gap: 13 (ref 5–15)
BUN: 25 mg/dL — ABNORMAL HIGH (ref 8–23)
CO2: 20 mmol/L — ABNORMAL LOW (ref 22–32)
Calcium: 9.7 mg/dL (ref 8.9–10.3)
Chloride: 102 mmol/L (ref 98–111)
Creatinine, Ser: 0.91 mg/dL (ref 0.44–1.00)
GFR, Estimated: 59 mL/min — ABNORMAL LOW (ref >60)
Glucose, Bld: 73 mg/dL (ref 70–99)
Potassium: 4.2 mmol/L (ref 3.5–5.1)
Sodium: 135 mmol/L (ref 135–145)
Total Bilirubin: 0.8 mg/dL (ref 0.3–1.2)
Total Protein: 6.8 g/dL (ref 6.5–8.1)

## 2020-11-07 LAB — CBC
HCT: 38.6 % (ref 36.0–46.0)
Hemoglobin: 12.3 g/dL (ref 12.0–15.0)
MCH: 27.2 pg (ref 26.0–34.0)
MCHC: 31.9 g/dL (ref 30.0–36.0)
MCV: 85.2 fL (ref 80.0–100.0)
Platelets: 217 10*3/uL (ref 150–400)
RBC: 4.53 MIL/uL (ref 3.87–5.11)
RDW: 14.6 % (ref 11.5–15.5)
WBC: 5.8 10*3/uL (ref 4.0–10.5)
nRBC: 0 % (ref 0.0–0.2)

## 2020-11-07 LAB — GLUCOSE, CAPILLARY
Glucose-Capillary: 80 mg/dL (ref 70–99)
Glucose-Capillary: 79 mg/dL (ref 70–99)
Glucose-Capillary: 94 mg/dL (ref 70–99)

## 2020-11-07 MED ORDER — COVID-19 MRNA VAC-TRIS(PFIZER) 30 MCG/0.3ML IM SUSP
0.3000 mL | Freq: Once | INTRAMUSCULAR | Status: AC
  Administered 2020-11-07: 0.3 mL via INTRAMUSCULAR
  Filled 2020-11-07: qty 0.3

## 2020-11-07 NOTE — TOC Progression Note (Addendum)
Transition of Care [facility]) - Progression Note    Patient Details  Name: Natalie Bullock MRN: [id number] Date of Birth: 10-Sep-1924  Transition of Care [facility]) CM/SW Contact  Joanne Chars, LCSW Phone Number: 11/07/2020, 1:17 PM  Clinical Narrative:  CSW spoke with pt in her room, pt able to respond, said she was feeling "better" but not able to actively engage in conversation.  CSW spoke with pt nephew [name] regarding bed offers, after reviewing offers, he would like to choose [facility].  CSW spoke with Lexine Baton at [facility] who does have space and could admit pt tomorrow but needs info on pt vaccine status.  1330: Per nephew, pt has been vaccinated, but not boosted for covid.  He would like her to get booster at hospital.    1340: [facility] needs pt to get booster prior to admission.  MD informed.     Expected Discharge Plan: [place] Barriers to Discharge: Continued Medical Work up  Expected Discharge Plan and Services Expected Discharge Plan: [place] Choice: [place] arrangements for the past 2 months: Single Family Home                                       Social Determinants of Health (SDOH) Interventions    Readmission Risk Interventions No flowsheet data found.

## 2020-11-07 NOTE — Progress Notes (Signed)
PROGRESS NOTE    WINDIE MARASCO  HBZ:[id number] DOB: [birth year] DOA: 11/03/2020 PCP: Seward Carol, MD   Brief Narrative:  87 y.o.femalewith medical history significant ofHTN, B/L knee OA, CAD s/p CABG,hypothyroidism,presented with altered mental status.  No obvious evidence of infection was noted, confusion was noted to be secondary to delirium.   Assessment & Plan:   Active Problems:   Delirium   Encephalopathy   Delirium Acute metabolic encephalopathy, delirium -It was improved yesterday but overnight had episode of agitation which required her to get Haldol.  This morning she is easily arousable but still drowsy.  PT is recommended SNF.  Fall -Unclear etiology.  Echocardiogram shows EF 55-60%.  PT recommends SNF.  HTN -Continue home meds  Hypothyroidism -TSH normal.  Continue Synthroid  Paroxysmal Afib, new diagnosis with RVR -Continue metoprolol for rate control.  High risk of fall therefore will hold off on anticoagulation  Had discussion with the patient's nephew at bedside regarding goals of care.  They would like patient to be DNR/DNI   DVT prophylaxis: Lovenox Code Status: DNR Family Communication: Nephew at bedside  Status is: Inpatient  Remains inpatient appropriate because:Inpatient level of care appropriate due to severity of illness   Dispo: The patient is from: Home              Anticipated d/c is to: SNF              Patient currently is not medically stable to d/c.  Drowsy after receiving Haldol   Difficult to place patient No    Subjective: Patient is quite drowsy this morning without any focal neurodeficits.  She is easily arousable.  She had episode of agitation last night requiring Haldol.  Nephew at bedside who tells me that prior to her hospital admission patient was ambulatory with the help of walker.  Off-and-on has issues with memory but overall they are quite mild  Review of Systems Otherwise negative except as per HPI,  including: General: Denies fever, chills, night sweats or unintended weight loss. Resp: Denies cough, wheezing, shortness of breath. Cardiac: Denies chest pain, palpitations, orthopnea, paroxysmal nocturnal dyspnea. GI: Denies abdominal pain, nausea, vomiting, diarrhea or constipation GU: Denies dysuria, frequency, hesitancy or incontinence MS: Denies muscle aches, joint pain or swelling Neuro: Denies headache, neurologic deficits (focal weakness, numbness, tingling), abnormal gait Psych: Denies anxiety, depression, SI/HI/AVH Skin: Denies new rashes or lesions ID: Denies sick contacts, exotic exposures, travel  Examination:  General exam: Appears calm and comfortable, drowsy.  Elderly frail Respiratory system: Clear to auscultation. Respiratory effort normal. Cardiovascular system: S1 & S2 heard, RRR. No JVD, murmurs, rubs, gallops or clicks. No pedal edema. Gastrointestinal system: Abdomen is nondistended, soft and nontender. No organomegaly or masses felt. Normal bowel sounds heard. Central nervous system: Alert to name.  Exam is nonfocal. Extremities: Symmetric 5 x 5 power. Skin: No rashes, lesions or ulcers Psychiatry: Difficult to assess  Objective: Vitals:   11/06/20 1834 11/06/20 2046 11/07/20 0557 11/07/20 1000  BP: 118/61 (!) 148/60 (!) 127/47 (!) 133/43  Pulse: 68 66 64   Resp:  17 17   Temp:  98.8 F (37.1 C) 97.9 F (36.6 C)   TempSrc:  Oral    SpO2:  97% 99%   Weight:      Height:        Intake/Output Summary (Last 24 hours) at 11/07/2020 1150 Last data filed at 11/06/2020 2028 Gross per 24 hour  Intake --  Output 1200 ml  Net -1200 ml   Filed Weights   11/03/20 0952  Weight: 64.6 kg     Data Reviewed:   CBC: Recent Labs  Lab 11/03/20 0954 11/05/20 0105 11/06/20 0436 11/07/20 0343  WBC 8.3 5.7 6.2 5.8  NEUTROABS 7.6  --   --   --   HGB 13.4 11.6* 12.3 12.3  HCT 41.9 36.4 38.6 38.6  MCV 84.6 84.5 84.8 85.2  PLT 242 169 233 401   Basic  Metabolic Panel: Recent Labs  Lab 11/03/20 0954 11/04/20 0202 11/05/20 0105 11/06/20 0436 11/07/20 0343  NA 137 136 137 137 135  K 4.0 3.8 5.3* 3.7 4.2  CL 103 104 108 106 102  CO2 23 25 23 22  20*  GLUCOSE 129* 75 103* 94 73  BUN 13 10 16 16  25*  CREATININE 0.81 0.73 0.86 0.80 0.91  CALCIUM 9.9 9.1 9.0 9.5 9.7  MG  --   --  2.1 2.1  --    GFR: Estimated Creatinine Clearance: 34.9 mL/min (by C-G formula based on SCr of 0.91 mg/dL). Liver Function Tests: Recent Labs  Lab 11/03/20 0954 11/05/20 0105 11/06/20 0436 11/07/20 0343  AST 22 49* 34 37  ALT 12 41 20 25  ALKPHOS 55 42 49 41  BILITOT 0.7 1.3* 0.7 0.8  PROT 7.8 6.2* 6.8 6.8  ALBUMIN 3.9 3.1* 3.5 3.4*   No results for input(s): LIPASE, AMYLASE in the last 168 hours. No results for input(s): AMMONIA in the last 168 hours. Coagulation Profile: No results for input(s): INR, PROTIME in the last 168 hours. Cardiac Enzymes: Recent Labs  Lab 11/03/20 0954  CKTOTAL 375*   BNP (last 3 results) No results for input(s): PROBNP in the last 8760 hours. HbA1C: No results for input(s): HGBA1C in the last 72 hours. CBG: Recent Labs  Lab 11/07/20 1009 11/07/20 1120  GLUCAP 80 79   Lipid Profile: No results for input(s): CHOL, HDL, LDLCALC, TRIG, CHOLHDL, LDLDIRECT in the last 72 hours. Thyroid Function Tests: No results for input(s): TSH, T4TOTAL, FREET4, T3FREE, THYROIDAB in the last 72 hours. Anemia Panel: No results for input(s): VITAMINB12, FOLATE, FERRITIN, TIBC, IRON, RETICCTPCT in the last 72 hours. Sepsis Labs: Recent Labs  Lab 11/03/20 1005 11/03/20 1205  LATICACIDVEN 2.5* 1.4    Recent Results (from the past 240 hour(s))  Resp Panel by RT-PCR (Flu A&B, Covid) Nasopharyngeal Swab     Status: None   Collection Time: 11/03/20 11:28 AM   Specimen: Nasopharyngeal Swab; Nasopharyngeal(NP) swabs in vial transport medium  Result Value Ref Range Status   SARS Coronavirus 2 by RT PCR NEGATIVE NEGATIVE Final     Comment: (NOTE) SARS-CoV-2 target nucleic acids are NOT DETECTED.  The SARS-CoV-2 RNA is generally detectable in upper respiratory specimens during the acute phase of infection. The lowest concentration of SARS-CoV-2 viral copies this assay can detect is 138 copies/mL. A negative result does not preclude SARS-Cov-2 infection and should not be used as the sole basis for treatment or other patient management decisions. A negative result may occur with  improper specimen collection/handling, submission of specimen other than nasopharyngeal swab, presence of viral mutation(s) within the areas targeted by this assay, and inadequate number of viral copies(<138 copies/mL). A negative result must be combined with clinical observations, patient history, and epidemiological information. The expected result is Negative.  Fact Sheet for Patients:  EntrepreneurPulse.com.au  Fact Sheet for Healthcare Providers:  IncredibleEmployment.be  This test is no t yet approved or cleared by the [place]  States FDA and  has been authorized for detection and/or diagnosis of SARS-CoV-2 by FDA under an Emergency Use Authorization (EUA). This EUA will remain  in effect (meaning this test can be used) for the duration of the COVID-19 declaration under Section 564(b)(1) of the Act, 21 U.S.C.section 360bbb-3(b)(1), unless the authorization is terminated  or revoked sooner.       Influenza A by PCR NEGATIVE NEGATIVE Final   Influenza B by PCR NEGATIVE NEGATIVE Final    Comment: (NOTE) The Xpert Xpress SARS-CoV-2/FLU/RSV plus assay is intended as an aid in the diagnosis of influenza from Nasopharyngeal swab specimens and should not be used as a sole basis for treatment. Nasal washings and aspirates are unacceptable for Xpert Xpress SARS-CoV-2/FLU/RSV testing.  Fact Sheet for Patients: EntrepreneurPulse.com.au  Fact Sheet for Healthcare  Providers: IncredibleEmployment.be  This test is not yet approved or cleared by the [place] FDA and has been authorized for detection and/or diagnosis of SARS-CoV-2 by FDA under an Emergency Use Authorization (EUA). This EUA will remain in effect (meaning this test can be used) for the duration of the COVID-19 declaration under Section 564(b)(1) of the Act, 21 U.S.C. section 360bbb-3(b)(1), unless the authorization is terminated or revoked.  Performed at [facility], [place] [address] [place], [place] [address]          Radiology Studies: ECHOCARDIOGRAM COMPLETE  Result Date: 11/05/2020    ECHOCARDIOGRAM REPORT   Patient Name:   VALORIE MCGRORY Date of Exam: 11/05/2020 Medical Rec #:  [id number]    Height:       63.0 in Accession #:    [id number]   Weight:       142.4 lb Date of Birth:  Jul 07, 1924   BSA:          1.674 m Patient Age:    39 years     BP:           169/73 mmHg Patient Gender: F            HR:           74 bpm. Exam Location:  Inpatient Procedure: Cardiac Doppler and Color Doppler Indications:    Syncope  History:        Patient has prior history of Echocardiogram examinations, most                 recent 08/16/2017. CAD, Prior CABG, Arrythmias:Atrial                 Fibrillation; Risk Factors:Hypertension.  Sonographer:    Clayton Lefort RDCS (AE) Referring Phys: [id number] [place]  1. Left ventricular ejection fraction, by estimation, is 55 to 60%. The left ventricle has normal function. The left ventricle has no regional wall motion abnormalities. There is mild left ventricular hypertrophy. Left ventricular diastolic parameters are indeterminate.  2. Right ventricular systolic function is normal. The right ventricular size is normal.  3. Left atrial size was moderately dilated.  4. The mitral valve is grossly normal. Trivial mitral valve regurgitation. No evidence of mitral stenosis.  5. The aortic valve is tricuspid. There is mild calcification  of the aortic valve. There is mild thickening of the aortic valve. Aortic valve regurgitation is not visualized. Mild aortic valve sclerosis is present, with no evidence of aortic valve stenosis.  6. The inferior vena cava is normal in size with greater than 50% respiratory variability, suggesting right atrial pressure of 3 mmHg. Comparison(s): No significant change  from prior study. FINDINGS  Left Ventricle: Left ventricular ejection fraction, by estimation, is 55 to 60%. The left ventricle has normal function. The left ventricle has no regional wall motion abnormalities. The left ventricular internal cavity size was normal in size. There is  mild left ventricular hypertrophy. Left ventricular diastolic parameters are indeterminate. Right Ventricle: The right ventricular size is normal. Right vetricular wall thickness was not well visualized. Right ventricular systolic function is normal. Left Atrium: Left atrial size was moderately dilated. Right Atrium: Right atrial size was not well visualized. Pericardium: There is no evidence of pericardial effusion. Mitral Valve: The mitral valve is grossly normal. There is mild thickening of the mitral valve leaflet(s). There is mild calcification of the mitral valve leaflet(s). Trivial mitral valve regurgitation. No evidence of mitral valve stenosis. Tricuspid Valve: The tricuspid valve is normal in structure. Tricuspid valve regurgitation is trivial. No evidence of tricuspid stenosis. Aortic Valve: The aortic valve is tricuspid. There is mild calcification of the aortic valve. There is mild thickening of the aortic valve. Aortic valve regurgitation is not visualized. Mild aortic valve sclerosis is present, with no evidence of aortic valve stenosis. Aortic valve mean gradient measures 5.0 mmHg. Aortic valve peak gradient measures 8.5 mmHg. Aortic valve area, by VTI measures 1.48 cm. Pulmonic Valve: The pulmonic valve was not well visualized. Pulmonic valve regurgitation is  not visualized. No evidence of pulmonic stenosis. Aorta: The aortic root, ascending aorta and aortic arch are all structurally normal, with no evidence of dilitation or obstruction. Venous: The inferior vena cava is normal in size with greater than 50% respiratory variability, suggesting right atrial pressure of 3 mmHg. IAS/Shunts: The atrial septum is grossly normal.  LEFT VENTRICLE PLAX 2D LVIDd:         4.50 cm  Diastology LVIDs:         2.60 cm  LV e' medial:    6.64 cm/s LV PW:         1.10 cm  LV E/e' medial:  10.7 LV IVS:        1.00 cm  LV e' lateral:   8.81 cm/s LVOT diam:     1.80 cm  LV E/e' lateral: 8.1 LV SV:         47 LV SV Index:   28 LVOT Area:     2.54 cm  RIGHT VENTRICLE             IVC RV Basal diam:  3.30 cm     IVC diam: 1.10 cm RV S prime:     10.80 cm/s TAPSE (M-mode): 1.4 cm LEFT ATRIUM             Index       RIGHT ATRIUM           Index LA diam:        3.80 cm 2.27 cm/m  RA Area:     26.10 cm LA Vol (A2C):   63.5 ml 37.94 ml/m RA Volume:   85.70 ml  51.20 ml/m LA Vol (A4C):   59.1 ml 35.31 ml/m LA Biplane Vol: 62.4 ml 37.28 ml/m  AORTIC VALVE AV Area (Vmax):    1.27 cm AV Area (Vmean):   1.13 cm AV Area (VTI):     1.48 cm AV Vmax:           146.00 cm/s AV Vmean:          104.000 cm/s AV VTI:  0.319 m AV Peak Grad:      8.5 mmHg AV Mean Grad:      5.0 mmHg LVOT Vmax:         73.10 cm/s LVOT Vmean:        46.300 cm/s LVOT VTI:          0.185 m LVOT/AV VTI ratio: 0.58  AORTA Ao Root diam: 3.40 cm Ao Asc diam:  3.40 cm MITRAL VALVE MV Area (PHT): 3.12 cm    SHUNTS MV Decel Time: 243 msec    Systemic VTI:  0.18 m MV E velocity: 71.10 cm/s  Systemic Diam: 1.80 cm MV A velocity: 51.80 cm/s MV E/A ratio:  1.37 Buford Dresser MD Electronically signed by Buford Dresser MD Signature Date/Time: 11/05/2020/4:07:33 PM    Final         Scheduled Meds: . amLODipine  5 mg Oral QHS  . artificial tears  1 application Both Eyes q morning  . ascorbic acid  500 mg Oral  Daily  . aspirin EC  81 mg Oral Daily  . enoxaparin (LOVENOX) injection  40 mg Subcutaneous Q24H  . levothyroxine  50 mcg Oral QAC breakfast  . metoprolol tartrate  25 mg Oral BID  . pravastatin  20 mg Oral q1800  . triamterene-hydrochlorothiazide  1 tablet Oral Daily   Continuous Infusions:   LOS: 4 days   Time spent= 35 mins    Debara Kamphuis Arsenio Loader, MD [facility]  If 7PM-7AM, please contact night-coverage  11/07/2020, 11:50 AM

## 2020-11-08 DIAGNOSIS — Z79890 Hormone replacement therapy (postmenopausal): Secondary | ICD-10-CM | POA: Diagnosis not present

## 2020-11-08 DIAGNOSIS — R109 Unspecified abdominal pain: Secondary | ICD-10-CM | POA: Diagnosis not present

## 2020-11-08 DIAGNOSIS — M25561 Pain in right knee: Secondary | ICD-10-CM | POA: Diagnosis not present

## 2020-11-08 DIAGNOSIS — M6281 Muscle weakness (generalized): Secondary | ICD-10-CM | POA: Diagnosis not present

## 2020-11-08 DIAGNOSIS — Z23 Encounter for immunization: Secondary | ICD-10-CM | POA: Diagnosis not present

## 2020-11-08 DIAGNOSIS — E039 Hypothyroidism, unspecified: Secondary | ICD-10-CM | POA: Diagnosis present

## 2020-11-08 DIAGNOSIS — K921 Melena: Secondary | ICD-10-CM | POA: Diagnosis not present

## 2020-11-08 DIAGNOSIS — Z9011 Acquired absence of right breast and nipple: Secondary | ICD-10-CM | POA: Diagnosis not present

## 2020-11-08 DIAGNOSIS — Z9181 History of falling: Secondary | ICD-10-CM | POA: Diagnosis not present

## 2020-11-08 DIAGNOSIS — Z820 Family history of epilepsy and other diseases of the nervous system: Secondary | ICD-10-CM | POA: Diagnosis not present

## 2020-11-08 DIAGNOSIS — I69828 Other speech and language deficits following other cerebrovascular disease: Secondary | ICD-10-CM | POA: Diagnosis not present

## 2020-11-08 DIAGNOSIS — I480 Paroxysmal atrial fibrillation: Secondary | ICD-10-CM | POA: Diagnosis not present

## 2020-11-08 DIAGNOSIS — E860 Dehydration: Secondary | ICD-10-CM | POA: Diagnosis present

## 2020-11-08 DIAGNOSIS — M170 Bilateral primary osteoarthritis of knee: Secondary | ICD-10-CM | POA: Diagnosis present

## 2020-11-08 DIAGNOSIS — D649 Anemia, unspecified: Secondary | ICD-10-CM | POA: Diagnosis not present

## 2020-11-08 DIAGNOSIS — M25562 Pain in left knee: Secondary | ICD-10-CM | POA: Diagnosis not present

## 2020-11-08 DIAGNOSIS — Z743 Need for continuous supervision: Secondary | ICD-10-CM | POA: Diagnosis not present

## 2020-11-08 DIAGNOSIS — S0081XA Abrasion of other part of head, initial encounter: Secondary | ICD-10-CM | POA: Diagnosis not present

## 2020-11-08 DIAGNOSIS — I2510 Atherosclerotic heart disease of native coronary artery without angina pectoris: Secondary | ICD-10-CM | POA: Diagnosis present

## 2020-11-08 DIAGNOSIS — Z66 Do not resuscitate: Secondary | ICD-10-CM | POA: Diagnosis not present

## 2020-11-08 DIAGNOSIS — Z853 Personal history of malignant neoplasm of breast: Secondary | ICD-10-CM | POA: Diagnosis not present

## 2020-11-08 DIAGNOSIS — R410 Disorientation, unspecified: Secondary | ICD-10-CM | POA: Diagnosis not present

## 2020-11-08 DIAGNOSIS — R0902 Hypoxemia: Secondary | ICD-10-CM | POA: Diagnosis not present

## 2020-11-08 DIAGNOSIS — N179 Acute kidney failure, unspecified: Secondary | ICD-10-CM | POA: Diagnosis present

## 2020-11-08 DIAGNOSIS — R58 Hemorrhage, not elsewhere classified: Secondary | ICD-10-CM | POA: Diagnosis not present

## 2020-11-08 DIAGNOSIS — Z20822 Contact with and (suspected) exposure to covid-19: Secondary | ICD-10-CM | POA: Diagnosis present

## 2020-11-08 DIAGNOSIS — Z515 Encounter for palliative care: Secondary | ICD-10-CM | POA: Diagnosis not present

## 2020-11-08 DIAGNOSIS — E785 Hyperlipidemia, unspecified: Secondary | ICD-10-CM | POA: Diagnosis present

## 2020-11-08 DIAGNOSIS — Z7189 Other specified counseling: Secondary | ICD-10-CM | POA: Diagnosis not present

## 2020-11-08 DIAGNOSIS — R413 Other amnesia: Secondary | ICD-10-CM | POA: Diagnosis not present

## 2020-11-08 DIAGNOSIS — I10 Essential (primary) hypertension: Secondary | ICD-10-CM | POA: Diagnosis present

## 2020-11-08 DIAGNOSIS — D62 Acute posthemorrhagic anemia: Secondary | ICD-10-CM | POA: Diagnosis present

## 2020-11-08 DIAGNOSIS — R4182 Altered mental status, unspecified: Secondary | ICD-10-CM | POA: Diagnosis present

## 2020-11-08 DIAGNOSIS — Z7401 Bed confinement status: Secondary | ICD-10-CM | POA: Diagnosis not present

## 2020-11-08 DIAGNOSIS — R627 Adult failure to thrive: Secondary | ICD-10-CM | POA: Diagnosis not present

## 2020-11-08 DIAGNOSIS — R531 Weakness: Secondary | ICD-10-CM | POA: Diagnosis not present

## 2020-11-08 DIAGNOSIS — K625 Hemorrhage of anus and rectum: Secondary | ICD-10-CM | POA: Diagnosis not present

## 2020-11-08 DIAGNOSIS — R2681 Unsteadiness on feet: Secondary | ICD-10-CM | POA: Diagnosis not present

## 2020-11-08 DIAGNOSIS — R5381 Other malaise: Secondary | ICD-10-CM | POA: Diagnosis not present

## 2020-11-08 DIAGNOSIS — G9340 Encephalopathy, unspecified: Secondary | ICD-10-CM | POA: Diagnosis not present

## 2020-11-08 DIAGNOSIS — K922 Gastrointestinal hemorrhage, unspecified: Secondary | ICD-10-CM | POA: Diagnosis not present

## 2020-11-08 DIAGNOSIS — Z951 Presence of aortocoronary bypass graft: Secondary | ICD-10-CM | POA: Diagnosis not present

## 2020-11-08 DIAGNOSIS — Z7982 Long term (current) use of aspirin: Secondary | ICD-10-CM | POA: Diagnosis not present

## 2020-11-08 DIAGNOSIS — I252 Old myocardial infarction: Secondary | ICD-10-CM | POA: Diagnosis not present

## 2020-11-08 DIAGNOSIS — R52 Pain, unspecified: Secondary | ICD-10-CM | POA: Diagnosis not present

## 2020-11-08 DIAGNOSIS — Z79899 Other long term (current) drug therapy: Secondary | ICD-10-CM | POA: Diagnosis not present

## 2020-11-08 DIAGNOSIS — R1030 Lower abdominal pain, unspecified: Secondary | ICD-10-CM | POA: Diagnosis present

## 2020-11-08 DIAGNOSIS — E875 Hyperkalemia: Secondary | ICD-10-CM | POA: Diagnosis present

## 2020-11-08 DIAGNOSIS — W19XXXA Unspecified fall, initial encounter: Secondary | ICD-10-CM | POA: Diagnosis not present

## 2020-11-08 DIAGNOSIS — R41841 Cognitive communication deficit: Secondary | ICD-10-CM | POA: Diagnosis not present

## 2020-11-08 DIAGNOSIS — E559 Vitamin D deficiency, unspecified: Secondary | ICD-10-CM | POA: Diagnosis not present

## 2020-11-08 LAB — RESP PANEL BY RT-PCR (FLU A&B, COVID) ARPGX2
Influenza A by PCR: NEGATIVE
Influenza B by PCR: NEGATIVE
SARS Coronavirus 2 by RT PCR: NEGATIVE

## 2020-11-08 MED ORDER — SENNOSIDES-DOCUSATE SODIUM 8.6-50 MG PO TABS: 1.0000 | ORAL_TABLET | Freq: Every evening | ORAL | Status: DC | PRN

## 2020-11-08 MED ORDER — METOPROLOL TARTRATE 25 MG PO TABS: 25.0000 mg | ORAL_TABLET | Freq: Two times a day (BID) | ORAL | Status: DC

## 2020-11-08 MED ORDER — TRAZODONE HCL 50 MG PO TABS: 50.0000 mg | ORAL_TABLET | Freq: Every evening | ORAL | Status: DC | PRN

## 2020-11-08 NOTE — TOC Transition Note (Signed)
Transition of Care [facility]) - CM/SW Discharge Note   Patient Details  Name: Natalie Bullock MRN: [id number] Date of Birth: June 02, 1925  Transition of Care [facility]) CM/SW Contact:  Joanne Chars, LCSW Phone Number: 11/08/2020, 11:55 AM   Clinical Narrative:   Pt discharging to [facility], room 509.  RN call report to [phone].    Final next level of care: Skilled Nursing Facility Barriers to Discharge: Barriers Resolved   Patient Goals and CMS Choice Patient states their goals for this hospitalization and ongoing recovery are:: hopefully to get back home CMS Medicare.gov Compare Post Acute Care list provided to:: Patient Represenative (must comment) Choice offered to / list presented to :  (nephew)  Discharge Placement              Patient chooses bed at: [facility] Patient to be transferred to facility by: [facility] Name of family member notified: [name] Patient and family notified of of transfer: 11/08/20  Discharge Plan and Services     Post Acute Care Choice: [facility]                               Social Determinants of Health (SDOH) Interventions     Readmission Risk Interventions No flowsheet data found.

## 2020-11-08 NOTE — Discharge Summary (Signed)
Physician Discharge Summary  Natalie Bullock WVP:[id number] DOB: [birth year] DOA: 11/03/2020  PCP: Seward Carol, MD  Admit date: 11/03/2020 Discharge date: 11/08/2020  Admitted From: Home Disposition: SNF  Recommendations for Outpatient Follow-up:  Follow up with PCP in 1-2 weeks Please obtain BMP/CBC in one week your next doctors visit.  Trazodone bedtime if needed for insomnia Avoid benzodiazepine Can give trial of low-dose Seroquel at bedtime if necessary    Discharge Condition: Stable CODE STATUS: DNR Diet recommendation: Heart healthy  Brief/Interim Summary: 87 y.o. female with medical history significant of HTN, B/L knee OA, CAD s/p CABG, hypothyroidism, presented with altered mental status.  No obvious evidence of infection was noted, confusion was noted to be secondary to delirium.  With hydration her mentation improved significantly and did not have any complaints.  PT recommended SNF therefore arrangements made.  Milon Dikes, updated during the hospital stay.  Medically stable for discharge.     Assessment & Plan:   Active Problems:   Delirium   Encephalopathy     Delirium Acute metabolic encephalopathy, delirium - Delirium has resolved.  Mentation is at baseline.  PT recommends SNF.   Fall -Unclear etiology.  Echocardiogram shows EF 55-60%.  PT recommends SNF.   HTN -Continue home meds   Hypothyroidism -TSH normal.  Continue Synthroid   Paroxysmal Afib, new diagnosis with RVR -Continue metoprolol for rate control.  High risk of fall therefore will hold off on anticoagulation   Had discussion with the patient's nephew at bedside regarding goals of care.  They would like patient to be DNR/DNI  Body mass index is 25.23 kg/m.     Discharge Diagnoses:  Active Problems:   Delirium   Encephalopathy     Subjective: Feels better no complaints this morning.  Discharge Exam: Vitals:   11/07/20 1955 11/08/20 0424  BP: 122/86 140/62  Pulse: (!) 57 63   Resp: 20 20  Temp: 98 F (36.7 C) 98.9 F (37.2 C)  SpO2: 98% 100%   Vitals:   11/07/20 1000 11/07/20 1334 11/07/20 1955 11/08/20 0424  BP: (!) 133/43 (!) 121/56 122/86 140/62  Pulse:  (!) 51 (!) 57 63  Resp:  16 20 20   Temp:  98.3 F (36.8 C) 98 F (36.7 C) 98.9 F (37.2 C)  TempSrc:   Oral   SpO2:  99% 98% 100%  Weight:      Height:        General: Pt is alert, awake, not in acute distress Cardiovascular: RRR, S1/S2 +, no rubs, no gallops Respiratory: CTA bilaterally, no wheezing, no rhonchi Abdominal: Soft, NT, ND, bowel sounds + Extremities: no edema, no cyanosis  Discharge Instructions   Allergies as of 11/08/2020       Reactions   Valium Shortness Of Breath   Codeine Other (See Comments)   Reaction not known   Crestor [rosuvastatin Calcium] Other (See Comments)   Reaction not known   Doxycycline Other (See Comments)   Reaction not known   Ezetimibe-simvastatin Other (See Comments)   Reaction not known (Vytorin)   Lipitor [atorvastatin Calcium] Other (See Comments)   Reaction not known   Sulfa Drugs Cross Reactors Other (See Comments)   Reaction not known   Tequin Other (See Comments)   Reaction not known   Zocor [simvastatin] Other (See Comments)   Reaction not known        Medication List     TAKE these medications    amLODipine 5 MG tablet Commonly known as:  NORVASC Take 5 mg by mouth daily.   aspirin 81 MG tablet Take 81 mg by mouth daily.   furosemide 20 MG tablet Commonly known as: LASIX Take 20 mg by mouth daily.   levothyroxine 50 MCG tablet Commonly known as: SYNTHROID Take 50 mcg by mouth daily before breakfast.   metoprolol tartrate 25 MG tablet Commonly known as: LOPRESSOR Take 1 tablet (25 mg total) by mouth 2 (two) times daily.   pravastatin 20 MG tablet Commonly known as: PRAVACHOL TAKE 1 TABLET BY MOUTH EVERYDAY AT BEDTIME What changed: See the new instructions.   senna-docusate 8.6-50 MG tablet Commonly known  as: Senokot-S Take 1 tablet by mouth at bedtime as needed for mild constipation.   SYSTANE OP Place 2 drops into both eyes every morning.   traZODone 50 MG tablet Commonly known as: DESYREL Take 1 tablet (50 mg total) by mouth at bedtime as needed for sleep.   triamterene-hydrochlorothiazide 37.5-25 MG tablet Commonly known as: MAXZIDE-25 Take 0.5 tablets by mouth daily.   VITAMIN C IMMUNE HEALTH PO Take 1 tablet by mouth daily.   C 500 500 MG Chew Generic drug: Ascorbic Acid Chew 500 mg by mouth daily.        Follow-up Information     Seward Carol, MD. Call in 1 week(s).   Specialty: Internal Medicine Contact information: [address] [place] [address] [phone]         Lelon Perla, MD .   Specialty: Cardiology Contact information: [address] [place] [address] [phone]                Allergies  Allergen Reactions   Valium Shortness Of Breath   Codeine Other (See Comments)    Reaction not known   Crestor [Rosuvastatin Calcium] Other (See Comments)    Reaction not known   Doxycycline Other (See Comments)    Reaction not known   Ezetimibe-Simvastatin Other (See Comments)    Reaction not known (Vytorin)   Lipitor [Atorvastatin Calcium] Other (See Comments)    Reaction not known   Sulfa Drugs Cross Reactors Other (See Comments)    Reaction not known   Tequin Other (See Comments)    Reaction not known   Zocor [Simvastatin] Other (See Comments)    Reaction not known    You were cared for by a hospitalist during your hospital stay. If you have any questions about your discharge medications or the care you received while you were in the hospital after you are discharged, you can call the unit and asked to speak with the hospitalist on call if the hospitalist that took care of you is not available. Once you are discharged, your primary care physician will handle any further medical issues. Please  note that no refills for any discharge medications will be authorized once you are discharged, as it is imperative that you return to your primary care physician (or establish a relationship with a primary care physician if you do not have one) for your aftercare needs so that they can reassess your need for medications and monitor your lab values.   Procedures/Studies: CT Head Wo Contrast  Result Date: 11/03/2020 CLINICAL DATA:  Found down with head and face trauma. Altered mental status. EXAM: CT HEAD WITHOUT CONTRAST CT MAXILLOFACIAL WITHOUT CONTRAST CT CERVICAL SPINE WITHOUT CONTRAST TECHNIQUE: Multidetector CT imaging of the head, cervical spine, and maxillofacial structures were performed using the standard protocol without intravenous contrast. Multiplanar CT  image reconstructions of the cervical spine and maxillofacial structures were also generated. COMPARISON:  CT head dated 03/26/2013, CT cervical spine dated 12/05/2012. FINDINGS: CT HEAD FINDINGS Brain: No evidence of acute infarction, hemorrhage, hydrocephalus, extra-axial collection or mass lesion/mass effect. There is moderate cerebral volume loss with associated ex vacuo dilatation. Periventricular white matter hypoattenuation likely represents chronic small vessel ischemic disease. Vascular: There are vascular calcifications in the carotid siphons. Skull: Normal. Negative for fracture or focal lesion. Other: There is scalp soft tissue swelling at the vertex. CT MAXILLOFACIAL FINDINGS Osseous: No fracture or mandibular dislocation. No destructive process. Orbits: Negative. No traumatic or inflammatory finding. Sinuses: There is right ethmoid sinus disease. Soft tissues: There is left periorbital and left forehead soft tissue swelling. CT CERVICAL SPINE FINDINGS Alignment: Normal. Skull base and vertebrae: No acute fracture. No primary bone lesion or focal pathologic process. Soft tissues and spinal canal: No prevertebral fluid or swelling. No  visible canal hematoma. Disc levels: Moderate to severe multilevel degenerative disc and joint disease. Upper chest: Negative. Other: None. IMPRESSION: 1. No acute intracranial process. 2. No acute osseous injury in the cervical spine. 3. No acute facial bone fracture. Electronically Signed   By: Zerita Boers M.D.   On: 11/03/2020 12:09   CT Cervical Spine Wo Contrast  Result Date: 11/03/2020 CLINICAL DATA:  Found down with head and face trauma. Altered mental status. EXAM: CT HEAD WITHOUT CONTRAST CT MAXILLOFACIAL WITHOUT CONTRAST CT CERVICAL SPINE WITHOUT CONTRAST TECHNIQUE: Multidetector CT imaging of the head, cervical spine, and maxillofacial structures were performed using the standard protocol without intravenous contrast. Multiplanar CT image reconstructions of the cervical spine and maxillofacial structures were also generated. COMPARISON:  CT head dated 03/26/2013, CT cervical spine dated 12/05/2012. FINDINGS: CT HEAD FINDINGS Brain: No evidence of acute infarction, hemorrhage, hydrocephalus, extra-axial collection or mass lesion/mass effect. There is moderate cerebral volume loss with associated ex vacuo dilatation. Periventricular white matter hypoattenuation likely represents chronic small vessel ischemic disease. Vascular: There are vascular calcifications in the carotid siphons. Skull: Normal. Negative for fracture or focal lesion. Other: There is scalp soft tissue swelling at the vertex. CT MAXILLOFACIAL FINDINGS Osseous: No fracture or mandibular dislocation. No destructive process. Orbits: Negative. No traumatic or inflammatory finding. Sinuses: There is right ethmoid sinus disease. Soft tissues: There is left periorbital and left forehead soft tissue swelling. CT CERVICAL SPINE FINDINGS Alignment: Normal. Skull base and vertebrae: No acute fracture. No primary bone lesion or focal pathologic process. Soft tissues and spinal canal: No prevertebral fluid or swelling. No visible canal hematoma.  Disc levels: Moderate to severe multilevel degenerative disc and joint disease. Upper chest: Negative. Other: None. IMPRESSION: 1. No acute intracranial process. 2. No acute osseous injury in the cervical spine. 3. No acute facial bone fracture. Electronically Signed   By: Zerita Boers M.D.   On: 11/03/2020 12:09   DG Pelvis Portable  Result Date: 11/03/2020 CLINICAL DATA:  Fall, leg pain EXAM: PORTABLE PELVIS 1-2 VIEWS COMPARISON:  Radiograph 12/05/2012 FINDINGS: Hips are located. No evidence of femoral neck fracture on AP view. LEFT femur internally rotated. Findings similar to comparison exam. No pelvic fracture or sacral fracture. Calcified leiomyoma noted. IMPRESSION: No evidence of pelvic fracture or hip fracture. Electronically Signed   By: Suzy Bouchard M.D.   On: 11/03/2020 11:03   DG Chest Portable 1 View  Result Date: 11/03/2020 CLINICAL DATA:  Leg pain post fall, history coronary artery disease post MI and CABG, hypertension, ischemic heart disease EXAM:  PORTABLE CHEST 1 VIEW COMPARISON:  Portable exam 1042 hours compared to 10/20/2015 FINDINGS: Normal heart size post CABG. Mediastinal contours and pulmonary vascularity normal. Atherosclerotic calcification aorta. Lungs clear. No pulmonary infiltrate, pleural effusion, or pneumothorax. Diffuse osseous demineralization. IMPRESSION: Post CABG. No acute abnormalities. Aortic Atherosclerosis (ICD10-I70.0). Electronically Signed   By: Lavonia Dana M.D.   On: 11/03/2020 11:01   DG Knee Left Port  Result Date: 11/03/2020 CLINICAL DATA:  Leg pain post unwitnessed fall EXAM: PORTABLE LEFT KNEE - 1-2 VIEW COMPARISON:  Portable exam 1045 hours compared to 01/21/2018 FINDINGS: Osseous demineralization. Tricompartmental osteoarthritic changes with joint space narrowing and spur formation greatest at medial compartment. No acute fracture, dislocation, or bone destruction. No joint effusion. Extensive atherosclerotic calcifications of superficial femoral  and popliteal arteries. IMPRESSION: Osseous demineralization with tricompartmental osteoarthritic changes. No acute abnormalities. Electronically Signed   By: Lavonia Dana M.D.   On: 11/03/2020 11:02   DG Knee Right Port  Result Date: 11/03/2020 CLINICAL DATA:  Pain following fall EXAM: PORTABLE RIGHT KNEE - 1-2 VIEW COMPARISON:  None. FINDINGS: Frontal and lateral views were obtained. No fracture or dislocation. No joint effusion. There is moderate narrowing in the patellofemoral joint region with milder narrowing medially. There is spurring in all compartments. There is chondrocalcinosis. There are multiple foci of arterial vascular calcification. Postoperative clips noted posterior and medial to the proximal tibia. IMPRESSION: No evident fracture, dislocation, or joint effusion. There is osteoarthritic change, most notably in the patellofemoral joint. Chondrocalcinosis is present which may be seen with osteoarthritis or calcium pyrophosphate deposition disease. There are foci of aortic atherosclerosis. Postoperative change noted posteriorly and medially. Electronically Signed   By: Lowella Grip III M.D.   On: 11/03/2020 11:02   ECHOCARDIOGRAM COMPLETE  Result Date: 11/05/2020    ECHOCARDIOGRAM REPORT   Patient Name:   Natalie Bullock Date of Exam: 11/05/2020 Medical Rec #:  [id number]    Height:       63.0 in Accession #:    [id number]   Weight:       142.4 lb Date of Birth:  05-Jul-1924   BSA:          1.674 m Patient Age:    58 years     BP:           169/73 mmHg Patient Gender: F            HR:           74 bpm. Exam Location:  Inpatient Procedure: Cardiac Doppler and Color Doppler Indications:    Syncope  History:        Patient has prior history of Echocardiogram examinations, most                 recent 08/16/2017. CAD, Prior CABG, Arrythmias:Atrial                 Fibrillation; Risk Factors:Hypertension.  Sonographer:    Clayton Lefort RDCS (AE) Referring Phys: [id number] [place]  1. Left  ventricular ejection fraction, by estimation, is 55 to 60%. The left ventricle has normal function. The left ventricle has no regional wall motion abnormalities. There is mild left ventricular hypertrophy. Left ventricular diastolic parameters are indeterminate.  2. Right ventricular systolic function is normal. The right ventricular size is normal.  3. Left atrial size was moderately dilated.  4. The mitral valve is grossly normal. Trivial mitral valve regurgitation. No evidence of mitral stenosis.  5. The aortic valve  is tricuspid. There is mild calcification of the aortic valve. There is mild thickening of the aortic valve. Aortic valve regurgitation is not visualized. Mild aortic valve sclerosis is present, with no evidence of aortic valve stenosis.  6. The inferior vena cava is normal in size with greater than 50% respiratory variability, suggesting right atrial pressure of 3 mmHg. Comparison(s): No significant change from prior study. FINDINGS  Left Ventricle: Left ventricular ejection fraction, by estimation, is 55 to 60%. The left ventricle has normal function. The left ventricle has no regional wall motion abnormalities. The left ventricular internal cavity size was normal in size. There is  mild left ventricular hypertrophy. Left ventricular diastolic parameters are indeterminate. Right Ventricle: The right ventricular size is normal. Right vetricular wall thickness was not well visualized. Right ventricular systolic function is normal. Left Atrium: Left atrial size was moderately dilated. Right Atrium: Right atrial size was not well visualized. Pericardium: There is no evidence of pericardial effusion. Mitral Valve: The mitral valve is grossly normal. There is mild thickening of the mitral valve leaflet(s). There is mild calcification of the mitral valve leaflet(s). Trivial mitral valve regurgitation. No evidence of mitral valve stenosis. Tricuspid Valve: The tricuspid valve is normal in structure.  Tricuspid valve regurgitation is trivial. No evidence of tricuspid stenosis. Aortic Valve: The aortic valve is tricuspid. There is mild calcification of the aortic valve. There is mild thickening of the aortic valve. Aortic valve regurgitation is not visualized. Mild aortic valve sclerosis is present, with no evidence of aortic valve stenosis. Aortic valve mean gradient measures 5.0 mmHg. Aortic valve peak gradient measures 8.5 mmHg. Aortic valve area, by VTI measures 1.48 cm. Pulmonic Valve: The pulmonic valve was not well visualized. Pulmonic valve regurgitation is not visualized. No evidence of pulmonic stenosis. Aorta: The aortic root, ascending aorta and aortic arch are all structurally normal, with no evidence of dilitation or obstruction. Venous: The inferior vena cava is normal in size with greater than 50% respiratory variability, suggesting right atrial pressure of 3 mmHg. IAS/Shunts: The atrial septum is grossly normal.  LEFT VENTRICLE PLAX 2D LVIDd:         4.50 cm  Diastology LVIDs:         2.60 cm  LV e' medial:    6.64 cm/s LV PW:         1.10 cm  LV E/e' medial:  10.7 LV IVS:        1.00 cm  LV e' lateral:   8.81 cm/s LVOT diam:     1.80 cm  LV E/e' lateral: 8.1 LV SV:         47 LV SV Index:   28 LVOT Area:     2.54 cm  RIGHT VENTRICLE             IVC RV Basal diam:  3.30 cm     IVC diam: 1.10 cm RV S prime:     10.80 cm/s TAPSE (M-mode): 1.4 cm LEFT ATRIUM             Index       RIGHT ATRIUM           Index LA diam:        3.80 cm 2.27 cm/m  RA Area:     26.10 cm LA Vol (A2C):   63.5 ml 37.94 ml/m RA Volume:   85.70 ml  51.20 ml/m LA Vol (A4C):   59.1 ml 35.31 ml/m LA Biplane Vol: 62.4 ml 37.28  ml/m  AORTIC VALVE AV Area (Vmax):    1.27 cm AV Area (Vmean):   1.13 cm AV Area (VTI):     1.48 cm AV Vmax:           146.00 cm/s AV Vmean:          104.000 cm/s AV VTI:            0.319 m AV Peak Grad:      8.5 mmHg AV Mean Grad:      5.0 mmHg LVOT Vmax:         73.10 cm/s LVOT Vmean:         46.300 cm/s LVOT VTI:          0.185 m LVOT/AV VTI ratio: 0.58  AORTA Ao Root diam: 3.40 cm Ao Asc diam:  3.40 cm MITRAL VALVE MV Area (PHT): 3.12 cm    SHUNTS MV Decel Time: 243 msec    Systemic VTI:  0.18 m MV E velocity: 71.10 cm/s  Systemic Diam: 1.80 cm MV A velocity: 51.80 cm/s MV E/A ratio:  1.37 Buford Dresser MD Electronically signed by Buford Dresser MD Signature Date/Time: 11/05/2020/4:07:33 PM    Final    CT Maxillofacial Wo Contrast  Result Date: 11/03/2020 CLINICAL DATA:  Found down with head and face trauma. Altered mental status. EXAM: CT HEAD WITHOUT CONTRAST CT MAXILLOFACIAL WITHOUT CONTRAST CT CERVICAL SPINE WITHOUT CONTRAST TECHNIQUE: Multidetector CT imaging of the head, cervical spine, and maxillofacial structures were performed using the standard protocol without intravenous contrast. Multiplanar CT image reconstructions of the cervical spine and maxillofacial structures were also generated. COMPARISON:  CT head dated 03/26/2013, CT cervical spine dated 12/05/2012. FINDINGS: CT HEAD FINDINGS Brain: No evidence of acute infarction, hemorrhage, hydrocephalus, extra-axial collection or mass lesion/mass effect. There is moderate cerebral volume loss with associated ex vacuo dilatation. Periventricular white matter hypoattenuation likely represents chronic small vessel ischemic disease. Vascular: There are vascular calcifications in the carotid siphons. Skull: Normal. Negative for fracture or focal lesion. Other: There is scalp soft tissue swelling at the vertex. CT MAXILLOFACIAL FINDINGS Osseous: No fracture or mandibular dislocation. No destructive process. Orbits: Negative. No traumatic or inflammatory finding. Sinuses: There is right ethmoid sinus disease. Soft tissues: There is left periorbital and left forehead soft tissue swelling. CT CERVICAL SPINE FINDINGS Alignment: Normal. Skull base and vertebrae: No acute fracture. No primary bone lesion or focal pathologic process.  Soft tissues and spinal canal: No prevertebral fluid or swelling. No visible canal hematoma. Disc levels: Moderate to severe multilevel degenerative disc and joint disease. Upper chest: Negative. Other: None. IMPRESSION: 1. No acute intracranial process. 2. No acute osseous injury in the cervical spine. 3. No acute facial bone fracture. Electronically Signed   By: Zerita Boers M.D.   On: 11/03/2020 12:09     The results of significant diagnostics from this hospitalization (including imaging, microbiology, ancillary and laboratory) are listed below for reference.     Microbiology: Recent Results (from the past 240 hour(s))  Resp Panel by RT-PCR (Flu A&B, Covid) Nasopharyngeal Swab     Status: None   Collection Time: 11/03/20 11:28 AM   Specimen: Nasopharyngeal Swab; Nasopharyngeal(NP) swabs in vial transport medium  Result Value Ref Range Status   SARS Coronavirus 2 by RT PCR NEGATIVE NEGATIVE Final    Comment: (NOTE) SARS-CoV-2 target nucleic acids are NOT DETECTED.  The SARS-CoV-2 RNA is generally detectable in upper respiratory specimens during the acute phase of infection. The lowest concentration of  SARS-CoV-2 viral copies this assay can detect is 138 copies/mL. A negative result does not preclude SARS-Cov-2 infection and should not be used as the sole basis for treatment or other patient management decisions. A negative result may occur with  improper specimen collection/handling, submission of specimen other than nasopharyngeal swab, presence of viral mutation(s) within the areas targeted by this assay, and inadequate number of viral copies(<138 copies/mL). A negative result must be combined with clinical observations, patient history, and epidemiological information. The expected result is Negative.  Fact Sheet for Patients:  EntrepreneurPulse.com.au  Fact Sheet for Healthcare Providers:  IncredibleEmployment.be  This test is no t yet  approved or cleared by the [place] FDA and  has been authorized for detection and/or diagnosis of SARS-CoV-2 by FDA under an Emergency Use Authorization (EUA). This EUA will remain  in effect (meaning this test can be used) for the duration of the COVID-19 declaration under Section 564(b)(1) of the Act, 21 U.S.C.section 360bbb-3(b)(1), unless the authorization is terminated  or revoked sooner.       Influenza A by PCR NEGATIVE NEGATIVE Final   Influenza B by PCR NEGATIVE NEGATIVE Final    Comment: (NOTE) The Xpert Xpress SARS-CoV-2/FLU/RSV plus assay is intended as an aid in the diagnosis of influenza from Nasopharyngeal swab specimens and should not be used as a sole basis for treatment. Nasal washings and aspirates are unacceptable for Xpert Xpress SARS-CoV-2/FLU/RSV testing.  Fact Sheet for Patients: EntrepreneurPulse.com.au  Fact Sheet for Healthcare Providers: IncredibleEmployment.be  This test is not yet approved or cleared by the [place] FDA and has been authorized for detection and/or diagnosis of SARS-CoV-2 by FDA under an Emergency Use Authorization (EUA). This EUA will remain in effect (meaning this test can be used) for the duration of the COVID-19 declaration under Section 564(b)(1) of the Act, 21 U.S.C. section 360bbb-3(b)(1), unless the authorization is terminated or revoked.  Performed at [facility], [place] [address], [place], [place] [address]      Labs: BNP (last 3 results) No results for input(s): BNP in the last 8760 hours. Basic Metabolic Panel: Recent Labs  Lab 11/03/20 0954 11/04/20 0202 11/05/20 0105 11/06/20 0436 11/07/20 0343  NA 137 136 137 137 135  K 4.0 3.8 5.3* 3.7 4.2  CL 103 104 108 106 102  CO2 23 25 23 22  20*  GLUCOSE 129* 75 103* 94 73  BUN 13 10 16 16  25*  CREATININE 0.81 0.73 0.86 0.80 0.91  CALCIUM 9.9 9.1 9.0 9.5 9.7  MG  --   --  2.1 2.1  --    Liver Function  Tests: Recent Labs  Lab 11/03/20 0954 11/05/20 0105 11/06/20 0436 11/07/20 0343  AST 22 49* 34 37  ALT 12 41 20 25  ALKPHOS 55 42 49 41  BILITOT 0.7 1.3* 0.7 0.8  PROT 7.8 6.2* 6.8 6.8  ALBUMIN 3.9 3.1* 3.5 3.4*   No results for input(s): LIPASE, AMYLASE in the last 168 hours. No results for input(s): AMMONIA in the last 168 hours. CBC: Recent Labs  Lab 11/03/20 0954 11/05/20 0105 11/06/20 0436 11/07/20 0343  WBC 8.3 5.7 6.2 5.8  NEUTROABS 7.6  --   --   --   HGB 13.4 11.6* 12.3 12.3  HCT 41.9 36.4 38.6 38.6  MCV 84.6 84.5 84.8 85.2  PLT 242 169 233 217   Cardiac Enzymes: Recent Labs  Lab 11/03/20 0954  CKTOTAL 375*   BNP: Invalid input(s): POCBNP CBG: Recent Labs  Lab 11/07/20 1009 11/07/20 1120 11/07/20 1624  GLUCAP 80 79 94   D-Dimer No results for input(s): DDIMER in the last 72 hours. Hgb A1c No results for input(s): HGBA1C in the last 72 hours. Lipid Profile No results for input(s): CHOL, HDL, LDLCALC, TRIG, CHOLHDL, LDLDIRECT in the last 72 hours. Thyroid function studies No results for input(s): TSH, T4TOTAL, T3FREE, THYROIDAB in the last 72 hours.  Invalid input(s): FREET3 Anemia work up No results for input(s): VITAMINB12, FOLATE, FERRITIN, TIBC, IRON, RETICCTPCT in the last 72 hours. Urinalysis    Component Value Date/Time   COLORURINE YELLOW 11/03/2020 0954   APPEARANCEUR HAZY (A) 11/03/2020 0954   LABSPEC 1.024 11/03/2020 0954   PHURINE 6.0 11/03/2020 0954   GLUCOSEU NEGATIVE 11/03/2020 0954   HGBUR NEGATIVE 11/03/2020 0954   BILIRUBINUR NEGATIVE 11/03/2020 0954   KETONESUR NEGATIVE 11/03/2020 0954   PROTEINUR NEGATIVE 11/03/2020 0954   UROBILINOGEN 1.0 03/26/2013 0624   NITRITE NEGATIVE 11/03/2020 0954   LEUKOCYTESUR TRACE (A) 11/03/2020 0954   Sepsis Labs Invalid input(s): PROCALCITONIN,  WBC,  LACTICIDVEN Microbiology Recent Results (from the past 240 hour(s))  Resp Panel by RT-PCR (Flu A&B, Covid) Nasopharyngeal Swab      Status: None   Collection Time: 11/03/20 11:28 AM   Specimen: Nasopharyngeal Swab; Nasopharyngeal(NP) swabs in vial transport medium  Result Value Ref Range Status   SARS Coronavirus 2 by RT PCR NEGATIVE NEGATIVE Final    Comment: (NOTE) SARS-CoV-2 target nucleic acids are NOT DETECTED.  The SARS-CoV-2 RNA is generally detectable in upper respiratory specimens during the acute phase of infection. The lowest concentration of SARS-CoV-2 viral copies this assay can detect is 138 copies/mL. A negative result does not preclude SARS-Cov-2 infection and should not be used as the sole basis for treatment or other patient management decisions. A negative result may occur with  improper specimen collection/handling, submission of specimen other than nasopharyngeal swab, presence of viral mutation(s) within the areas targeted by this assay, and inadequate number of viral copies(<138 copies/mL). A negative result must be combined with clinical observations, patient history, and epidemiological information. The expected result is Negative.  Fact Sheet for Patients:  EntrepreneurPulse.com.au  Fact Sheet for Healthcare Providers:  IncredibleEmployment.be  This test is no t yet approved or cleared by the [place] FDA and  has been authorized for detection and/or diagnosis of SARS-CoV-2 by FDA under an Emergency Use Authorization (EUA). This EUA will remain  in effect (meaning this test can be used) for the duration of the COVID-19 declaration under Section 564(b)(1) of the Act, 21 U.S.C.section 360bbb-3(b)(1), unless the authorization is terminated  or revoked sooner.       Influenza A by PCR NEGATIVE NEGATIVE Final   Influenza B by PCR NEGATIVE NEGATIVE Final    Comment: (NOTE) The Xpert Xpress SARS-CoV-2/FLU/RSV plus assay is intended as an aid in the diagnosis of influenza from Nasopharyngeal swab specimens and should not be used as a sole basis  for treatment. Nasal washings and aspirates are unacceptable for Xpert Xpress SARS-CoV-2/FLU/RSV testing.  Fact Sheet for Patients: EntrepreneurPulse.com.au  Fact Sheet for Healthcare Providers: IncredibleEmployment.be  This test is not yet approved or cleared by the [place] FDA and has been authorized for detection and/or diagnosis of SARS-CoV-2 by FDA under an Emergency Use Authorization (EUA). This EUA will remain in effect (meaning this test can be used) for the duration of the COVID-19 declaration under Section 564(b)(1) of the Act, 21 U.S.C. section 360bbb-3(b)(1), unless the authorization is  terminated or revoked.  Performed at [facility], [place] [address], [place],  [address]      Time coordinating discharge:  I have spent 35 minutes face to face with the patient and on the ward discussing the patients care, assessment, plan and disposition with other care givers. >50% of the time was devoted counseling the patient about the risks and benefits of treatment/Discharge disposition and coordinating care.   SIGNED:   Damita Lack, MD  [facility] 11/08/2020, 10:51 AM   If 7PM-7AM, please contact night-coverage

## 2020-11-14 DIAGNOSIS — R52 Pain, unspecified: Secondary | ICD-10-CM | POA: Diagnosis not present

## 2020-11-14 DIAGNOSIS — M170 Bilateral primary osteoarthritis of knee: Secondary | ICD-10-CM | POA: Diagnosis not present

## 2020-11-15 DIAGNOSIS — I480 Paroxysmal atrial fibrillation: Secondary | ICD-10-CM | POA: Diagnosis not present

## 2020-11-15 DIAGNOSIS — R52 Pain, unspecified: Secondary | ICD-10-CM | POA: Diagnosis not present

## 2020-11-15 DIAGNOSIS — R5381 Other malaise: Secondary | ICD-10-CM | POA: Diagnosis not present

## 2020-11-15 DIAGNOSIS — Z9181 History of falling: Secondary | ICD-10-CM | POA: Diagnosis not present

## 2020-11-27 ENCOUNTER — Other Ambulatory Visit: Payer: Self-pay

## 2020-11-27 ENCOUNTER — Inpatient Hospital Stay (HOSPITAL_COMMUNITY)
Admission: EM | Admit: 2020-11-27 | Discharge: 2020-11-30 | DRG: 378 | Disposition: A | Discharge status: Skilled Nursing Facility | Payer: Medicare Other | Source: Skilled Nursing Facility | Attending: Internal Medicine | Admitting: Internal Medicine

## 2020-11-27 ENCOUNTER — Encounter (HOSPITAL_COMMUNITY): Payer: Self-pay

## 2020-11-27 DIAGNOSIS — Z7189 Other specified counseling: Secondary | ICD-10-CM | POA: Diagnosis not present

## 2020-11-27 DIAGNOSIS — I480 Paroxysmal atrial fibrillation: Secondary | ICD-10-CM | POA: Diagnosis not present

## 2020-11-27 DIAGNOSIS — E785 Hyperlipidemia, unspecified: Secondary | ICD-10-CM | POA: Diagnosis present

## 2020-11-27 DIAGNOSIS — R109 Unspecified abdominal pain: Secondary | ICD-10-CM | POA: Diagnosis not present

## 2020-11-27 DIAGNOSIS — Z20822 Contact with and (suspected) exposure to covid-19: Secondary | ICD-10-CM | POA: Diagnosis present

## 2020-11-27 DIAGNOSIS — N179 Acute kidney failure, unspecified: Secondary | ICD-10-CM | POA: Diagnosis present

## 2020-11-27 DIAGNOSIS — Z820 Family history of epilepsy and other diseases of the nervous system: Secondary | ICD-10-CM

## 2020-11-27 DIAGNOSIS — Z79899 Other long term (current) drug therapy: Secondary | ICD-10-CM

## 2020-11-27 DIAGNOSIS — R5381 Other malaise: Secondary | ICD-10-CM | POA: Diagnosis not present

## 2020-11-27 DIAGNOSIS — R2681 Unsteadiness on feet: Secondary | ICD-10-CM | POA: Diagnosis not present

## 2020-11-27 DIAGNOSIS — Z515 Encounter for palliative care: Secondary | ICD-10-CM

## 2020-11-27 DIAGNOSIS — I69828 Other speech and language deficits following other cerebrovascular disease: Secondary | ICD-10-CM | POA: Diagnosis not present

## 2020-11-27 DIAGNOSIS — R413 Other amnesia: Secondary | ICD-10-CM

## 2020-11-27 DIAGNOSIS — Z79890 Hormone replacement therapy (postmenopausal): Secondary | ICD-10-CM

## 2020-11-27 DIAGNOSIS — E559 Vitamin D deficiency, unspecified: Secondary | ICD-10-CM | POA: Diagnosis not present

## 2020-11-27 DIAGNOSIS — D649 Anemia, unspecified: Secondary | ICD-10-CM | POA: Diagnosis not present

## 2020-11-27 DIAGNOSIS — R41841 Cognitive communication deficit: Secondary | ICD-10-CM | POA: Diagnosis not present

## 2020-11-27 DIAGNOSIS — E860 Dehydration: Secondary | ICD-10-CM | POA: Diagnosis present

## 2020-11-27 DIAGNOSIS — M170 Bilateral primary osteoarthritis of knee: Secondary | ICD-10-CM | POA: Diagnosis present

## 2020-11-27 DIAGNOSIS — R4182 Altered mental status, unspecified: Secondary | ICD-10-CM | POA: Diagnosis present

## 2020-11-27 DIAGNOSIS — Z9011 Acquired absence of right breast and nipple: Secondary | ICD-10-CM | POA: Diagnosis not present

## 2020-11-27 DIAGNOSIS — Z951 Presence of aortocoronary bypass graft: Secondary | ICD-10-CM

## 2020-11-27 DIAGNOSIS — R1312 Dysphagia, oropharyngeal phase: Secondary | ICD-10-CM | POA: Diagnosis not present

## 2020-11-27 DIAGNOSIS — M6281 Muscle weakness (generalized): Secondary | ICD-10-CM | POA: Diagnosis not present

## 2020-11-27 DIAGNOSIS — I10 Essential (primary) hypertension: Secondary | ICD-10-CM | POA: Diagnosis present

## 2020-11-27 DIAGNOSIS — D62 Acute posthemorrhagic anemia: Secondary | ICD-10-CM | POA: Diagnosis present

## 2020-11-27 DIAGNOSIS — R1030 Lower abdominal pain, unspecified: Secondary | ICD-10-CM | POA: Diagnosis present

## 2020-11-27 DIAGNOSIS — I2510 Atherosclerotic heart disease of native coronary artery without angina pectoris: Secondary | ICD-10-CM | POA: Diagnosis present

## 2020-11-27 DIAGNOSIS — R58 Hemorrhage, not elsewhere classified: Secondary | ICD-10-CM | POA: Diagnosis not present

## 2020-11-27 DIAGNOSIS — Z7982 Long term (current) use of aspirin: Secondary | ICD-10-CM | POA: Diagnosis not present

## 2020-11-27 DIAGNOSIS — R5383 Other fatigue: Secondary | ICD-10-CM | POA: Diagnosis not present

## 2020-11-27 DIAGNOSIS — K921 Melena: Principal | ICD-10-CM

## 2020-11-27 DIAGNOSIS — R2689 Other abnormalities of gait and mobility: Secondary | ICD-10-CM | POA: Diagnosis not present

## 2020-11-27 DIAGNOSIS — I252 Old myocardial infarction: Secondary | ICD-10-CM

## 2020-11-27 DIAGNOSIS — Z9181 History of falling: Secondary | ICD-10-CM | POA: Diagnosis not present

## 2020-11-27 DIAGNOSIS — Z7401 Bed confinement status: Secondary | ICD-10-CM | POA: Diagnosis not present

## 2020-11-27 DIAGNOSIS — Z66 Do not resuscitate: Secondary | ICD-10-CM | POA: Diagnosis present

## 2020-11-27 DIAGNOSIS — Z743 Need for continuous supervision: Secondary | ICD-10-CM | POA: Diagnosis not present

## 2020-11-27 DIAGNOSIS — K625 Hemorrhage of anus and rectum: Secondary | ICD-10-CM | POA: Diagnosis not present

## 2020-11-27 DIAGNOSIS — G9340 Encephalopathy, unspecified: Secondary | ICD-10-CM | POA: Diagnosis not present

## 2020-11-27 DIAGNOSIS — K922 Gastrointestinal hemorrhage, unspecified: Secondary | ICD-10-CM | POA: Diagnosis present

## 2020-11-27 DIAGNOSIS — E039 Hypothyroidism, unspecified: Secondary | ICD-10-CM | POA: Diagnosis present

## 2020-11-27 DIAGNOSIS — Z853 Personal history of malignant neoplasm of breast: Secondary | ICD-10-CM | POA: Diagnosis not present

## 2020-11-27 DIAGNOSIS — R627 Adult failure to thrive: Secondary | ICD-10-CM | POA: Diagnosis not present

## 2020-11-27 DIAGNOSIS — E875 Hyperkalemia: Secondary | ICD-10-CM | POA: Diagnosis present

## 2020-11-27 DIAGNOSIS — R410 Disorientation, unspecified: Secondary | ICD-10-CM | POA: Diagnosis not present

## 2020-11-27 DIAGNOSIS — R531 Weakness: Secondary | ICD-10-CM | POA: Diagnosis not present

## 2020-11-27 DIAGNOSIS — R0902 Hypoxemia: Secondary | ICD-10-CM | POA: Diagnosis not present

## 2020-11-27 LAB — RESP PANEL BY RT-PCR (FLU A&B, COVID) ARPGX2
Influenza A by PCR: NEGATIVE
Influenza B by PCR: NEGATIVE
SARS Coronavirus 2 by RT PCR: NEGATIVE

## 2020-11-27 LAB — COMPREHENSIVE METABOLIC PANEL
ALT: 17 U/L (ref 0–44)
AST: 32 U/L (ref 15–41)
Albumin: 3.5 g/dL (ref 3.5–5.0)
Alkaline Phosphatase: 48 U/L (ref 38–126)
Anion gap: 16 — ABNORMAL HIGH (ref 5–15)
BUN: 72 mg/dL — ABNORMAL HIGH (ref 8–23)
CO2: 22 mmol/L (ref 22–32)
Calcium: 9.9 mg/dL (ref 8.9–10.3)
Chloride: 94 mmol/L — ABNORMAL LOW (ref 98–111)
Creatinine, Ser: 1.25 mg/dL — ABNORMAL HIGH (ref 0.44–1.00)
GFR, Estimated: 40 mL/min — ABNORMAL LOW (ref >60)
Glucose, Bld: 83 mg/dL (ref 70–99)
Potassium: 5.2 mmol/L — ABNORMAL HIGH (ref 3.5–5.1)
Sodium: 132 mmol/L — ABNORMAL LOW (ref 135–145)
Total Bilirubin: 1.9 mg/dL — ABNORMAL HIGH (ref 0.3–1.2)
Total Protein: 7.3 g/dL (ref 6.5–8.1)

## 2020-11-27 LAB — HEMOGLOBIN AND HEMATOCRIT, BLOOD
HCT: 34.5 % — ABNORMAL LOW (ref 36.0–46.0)
Hemoglobin: 11.4 g/dL — ABNORMAL LOW (ref 12.0–15.0)

## 2020-11-27 LAB — CBC WITH DIFFERENTIAL/PLATELET
Abs Immature Granulocytes: 0.03 10*3/uL (ref 0.00–0.07)
Basophils Absolute: 0 10*3/uL (ref 0.0–0.1)
Basophils Relative: 0 %
Eosinophils Absolute: 0.1 10*3/uL (ref 0.0–0.5)
Eosinophils Relative: 1 %
HCT: 41.8 % (ref 36.0–46.0)
Hemoglobin: 13.4 g/dL (ref 12.0–15.0)
Immature Granulocytes: 0 %
Lymphocytes Relative: 15 %
Lymphs Abs: 1 10*3/uL (ref 0.7–4.0)
MCH: 27.7 pg (ref 26.0–34.0)
MCHC: 32.1 g/dL (ref 30.0–36.0)
MCV: 86.4 fL (ref 80.0–100.0)
Monocytes Absolute: 0.5 10*3/uL (ref 0.1–1.0)
Monocytes Relative: 7 %
Neutro Abs: 5.1 10*3/uL (ref 1.7–7.7)
Neutrophils Relative %: 77 %
Platelets: 261 10*3/uL (ref 150–400)
RBC: 4.84 MIL/uL (ref 3.87–5.11)
RDW: 14.9 % (ref 11.5–15.5)
WBC: 6.7 10*3/uL (ref 4.0–10.5)
nRBC: 0 % (ref 0.0–0.2)

## 2020-11-27 LAB — PROTIME-INR
INR: 1 (ref 0.8–1.2)
Prothrombin Time: 13 seconds (ref 11.4–15.2)

## 2020-11-27 LAB — TYPE AND SCREEN
ABO/RH(D): A POS
Antibody Screen: NEGATIVE

## 2020-11-27 LAB — LIPASE, BLOOD: Lipase: 50 U/L (ref 11–51)

## 2020-11-27 LAB — POC OCCULT BLOOD, ED: Fecal Occult Bld: POSITIVE — AB

## 2020-11-27 LAB — ABO/RH: ABO/RH(D): A POS

## 2020-11-27 LAB — APTT: aPTT: 32 seconds (ref 24–36)

## 2020-11-27 MED ORDER — PANTOPRAZOLE SODIUM 40 MG IV SOLR
40.0000 mg | Freq: Once | INTRAVENOUS | Status: AC
  Administered 2020-11-27: 40 mg via INTRAVENOUS
  Filled 2020-11-27: qty 40

## 2020-11-27 MED ORDER — PANTOPRAZOLE INFUSION (NEW) - SIMPLE MED
8.0000 mg/h | INTRAVENOUS | Status: DC
  Administered 2020-11-27 – 2020-11-29 (×5): 8 mg/h via INTRAVENOUS
  Filled 2020-11-27 (×5): qty 80

## 2020-11-27 MED ORDER — LACTATED RINGERS IV SOLN: INTRAVENOUS | Status: AC

## 2020-11-27 MED ORDER — LACTATED RINGERS IV SOLN: INTRAVENOUS | Status: DC

## 2020-11-27 MED ORDER — PANTOPRAZOLE SODIUM 40 MG IV SOLR: 40.0000 mg | Freq: Two times a day (BID) | INTRAVENOUS | Status: DC

## 2020-11-27 MED ORDER — SODIUM CHLORIDE 0.9 % IV SOLN: INTRAVENOUS | Status: DC

## 2020-11-27 MED ORDER — ACETAMINOPHEN 325 MG PO TABS
650.0000 mg | ORAL_TABLET | Freq: Once | ORAL | Status: AC
  Administered 2020-11-27: 650 mg via ORAL
  Filled 2020-11-27: qty 2

## 2020-11-27 NOTE — ED Notes (Signed)
Help get patient on the monitor patient is resting with call bell in reach 

## 2020-11-27 NOTE — ED Provider Notes (Signed)
Patient care assumed at 1500. Patient here for evaluation of Pyro blood per rectum for two days. She is hemodynamically stable with a stable hemoglobin. She does have gross blood on rectal examination. BMP is concerning for possible upper G.I. bleed with uremia. She was started on Protonix. Discussed with Dr. Paulita Fujita with [facility] Hospitalist consulted for admission.   Quintella Reichert, MD 11/27/20 2014

## 2020-11-27 NOTE — ED Provider Notes (Signed)
[facility] EMERGENCY DEPARTMENT Provider Note   CSN: [id number] Arrival date & time: 11/27/20  1327     History Chief complaint: Rectal bleeding  Natalie Bullock is a 87 y.o. female.  HPI  Patient presented to the ED for evaluation of rectal bleeding that started yesterday.  According to the EMS report the nursing home indicated the patient had some blood in her stools yesterday.  Today however she started having increasing amounts of bright red bleeding.  Patient does complain of abdominal cramping on both sides of her abdomen.  She has not had any nausea or vomiting.  No hematemesis.  She denies taking any blood thinning agents.  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 EF 60-70%   CARDIOVASCULAR STRESS TEST  Aug 2010   Normal; EF 74%   CORONARY ARTERY BYPASS GRAFT  12/2000   LEFT INTERNAL MAMMARY TO THE LAD, SAPHENOUS VEIN GRAFT TO THE FIRST DIAGONAL, SAPHENOUS VEIN GRAFT TO THE RAMUS INTERMEDIATE, AND A SEQUENTIAL SAPHENOUS VEIN GRAFT TO THE POSTERIOR DESCENDING AND POSTERIOR LATERAL  BRANCHES.   MASS EXCISION  04/08/2011   Procedure: EXCISION MASS;  Surgeon: Wynonia Sours, MD;  Location: [place] [facility];  Service: Orthopedics;  Laterality: Left;  excision mass left palm   MASTECTOMY  1975   RIGHT SIDE   TUBAL LIGATION     1960's     OB History   No obstetric history on file.     Family History  Problem Relation Age of Onset   Alzheimer's disease Mother     Social History   Tobacco Use   Smoking status: Former    Packs/day: 1.00    Years: 4.00    Pack years: 4.00    Types: Cigarettes    Quit date: 06/02/1969    Years since quitting: 51.5   Smokeless tobacco: Never  Substance Use Topics   Alcohol use: No   Drug use: No    Home Medications Prior to Admission medications   Medication Sig Start Date End Date Taking? Authorizing Provider  amLODipine (NORVASC) 5 MG tablet Take 5 mg by mouth daily.    [provider]  Ascorbic Acid (C 500) 500 MG CHEW Chew 500 mg by mouth daily.    [provider]  Ascorbic Acid (VITAMIN C IMMUNE HEALTH PO) Take 1 tablet by mouth daily. Patient not taking: No sig  reported    [provider]  aspirin 81 MG tablet Take 81 mg by mouth daily.    [provider]  furosemide (LASIX) 20 MG tablet Take 20 mg by mouth daily.    [provider]  levothyroxine (SYNTHROID, LEVOTHROID) 50 MCG tablet Take 50 mcg by mouth daily before breakfast.    [provider]  metoprolol tartrate (LOPRESSOR) 25 MG tablet Take 1 tablet (25 mg total) by mouth 2 (two) times daily. 11/08/20   Amin, Jeanella Flattery, MD  Polyethyl Glycol-Propyl Glycol (SYSTANE OP) Place 2 drops into both eyes every morning.    [provider]  pravastatin (PRAVACHOL) 20 MG tablet TAKE 1 TABLET BY MOUTH EVERYDAY AT BEDTIME 05/11/20   Lelon Perla, MD  senna-docusate (SENOKOT-S) 8.6-50 MG tablet Take 1 tablet by mouth at bedtime as needed for mild constipation. 11/08/20   Amin, Jeanella Flattery, MD  traZODone  (DESYREL) 50 MG tablet Take 1 tablet (50 mg total) by mouth at bedtime as needed for sleep. 11/08/20   Amin, Jeanella Flattery, MD  triamterene-hydrochlorothiazide (MAXZIDE-25) 37.5-25 MG tablet Take 0.5 tablets by mouth daily.    [provider]    Allergies    Valium, Codeine, Crestor [rosuvastatin calcium], Doxycycline, Ezetimibe-simvastatin, Lipitor [atorvastatin calcium], Sulfa drugs cross reactors, Tequin, and Zocor [simvastatin]  Review of Systems   Review of Systems  All other systems reviewed and are negative.  Physical Exam Updated Vital Signs BP 121/62 (BP Location: Right Arm)   Pulse 67   Temp 98 F (36.7 C) (Oral)   Resp 19   Ht 1.6 m (5\' 3" )   Wt 63.5 kg   SpO2 98%   BMI 24.80 kg/m   Physical Exam Vitals and nursing note reviewed.  Constitutional:      General: She is not in acute distress.    Appearance: She is well-developed.  HENT:     Head: Normocephalic and atraumatic.     Right Ear: External ear normal.     Left Ear: External ear normal.  Eyes:     General: No scleral icterus.       Right eye: No discharge.        Left eye: No discharge.     Conjunctiva/sclera: Conjunctivae normal.  Neck:     Trachea: No tracheal deviation.  Cardiovascular:     Rate and Rhythm: Normal rate and regular rhythm.  Pulmonary:     Effort: Pulmonary effort is normal. No respiratory distress.     Breath sounds: Normal breath sounds. No stridor. No wheezing or rales.  Abdominal:     General: Bowel sounds are normal. There is no distension.     Palpations: Abdomen is soft.     Tenderness: There is abdominal tenderness. There is no guarding or rebound.  Genitourinary:    Comments: Large amount of dark blood noted on rectal exam Musculoskeletal:        General: No tenderness or deformity.     Cervical back: Neck supple.  Skin:    General: Skin is warm and dry.     Findings: No rash.  Neurological:     General: No focal deficit present.     Mental Status: She is  alert.     Cranial Nerves: No cranial nerve deficit (no facial droop, extraocular movements intact, no slurred speech).     Sensory: No sensory deficit.     Motor: No abnormal muscle tone or seizure activity.     Coordination: Coordination normal.  Psychiatric:        Mood and Affect: Mood normal.    ED Results / Procedures / Treatments   Labs (all labs ordered are listed, but only abnormal results are displayed) Labs Reviewed  POC OCCULT BLOOD, ED - Abnormal; Notable for the following components:      Result Value   Fecal Occult Bld POSITIVE (*)    All other components within normal limits  RESP PANEL BY RT-PCR (FLU A&B, COVID) ARPGX2  COMPREHENSIVE METABOLIC PANEL  CBC WITH DIFFERENTIAL/PLATELET  APTT  PROTIME-INR  TYPE AND SCREEN    EKG None  Radiology No results found.  Procedures Procedures   Medications Ordered in ED Medications  0.9 %  sodium chloride infusion (has no administration in time range)    ED Course  I have reviewed the triage vital signs and the nursing notes.  Pertinent labs & imaging results that were available during my care of the patient were reviewed by me and considered in my medical decision making (see chart for details).    MDM Rules/Calculators/A&P                          Pt presents with rectal bleeding.  Large amount of blood noted on rectal exam.   Appears to be lower gi although elevated bun would suggest upper.  Heme stable.  CBC normal.  No indication for transfusion at this time.  Plan on GI consult medical admission for further treatment. Final Clinical Impression(s) / ED Diagnoses Final diagnoses:  Gastrointestinal hemorrhage, unspecified gastrointestinal hemorrhage type     Dorie Rank, MD 11/28/20 [id number]

## 2020-11-27 NOTE — ED Triage Notes (Signed)
"  Skilled nursing facility said that patient had some bright red bleeding yesterday a little and today it began again" per EMS

## 2020-11-27 NOTE — H&P (Signed)
History and Physical    Natalie Bullock:[id number] DOB: 12-Apr-1925 DOA: 11/27/2020  PCP: Seward Carol, MD Consultants:  cardiology: Dr. Stanford Breed  Patient coming from:  SNF: [facility]   Chief Complaint: blood in stool   History obtained from nephew mark oglesby and chart/records.   HPI: Natalie Bullock is a 87 y.o. female with medical history significant of HTN, CAD s/p CABG in 2002, hypothyroidism, bilateral knee OA and memory loss since last hospitalization who was seen for rectal bleeding that started yesterday. The SNF indicated she had some blood in her stool yesterday and was worse today so they sent her to hospital.  She is a poor historian and can not really tell me anything. She denies any headaches, N/V/D, abdominal pain, chest pain, palpitations. She denies any dizziness, shortness of breath. She states her legs hurt, but points to her lower abdomen as well. She has a DNR with her from her facility.   On ASA at SNF.   ED Course: vitals stable: bp: 121/62, HR: 67, afebrile, oxygen 98% room air . Fecal occult + with gross bloody BM, BUN 72, creatinine of 1.25. GI consulted. One dose IV protonix given and made NPO. Asked to admit for GI bleed.   Review of Systems: As per HPI; otherwise review of systems reviewed and negative.   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 EF 60-70%   CARDIOVASCULAR STRESS TEST  Aug 2010    Normal; EF 74%   CORONARY ARTERY BYPASS GRAFT  12/2000   LEFT INTERNAL MAMMARY TO THE LAD, SAPHENOUS VEIN GRAFT TO THE FIRST DIAGONAL, SAPHENOUS VEIN GRAFT TO THE RAMUS INTERMEDIATE, AND A SEQUENTIAL SAPHENOUS VEIN GRAFT TO THE POSTERIOR DESCENDING AND POSTERIOR LATERAL BRANCHES.   MASS EXCISION  04/08/2011   Procedure: EXCISION MASS;  Surgeon: Wynonia Sours, MD;  Location: [place];  Service: Orthopedics;  Laterality: Left;  excision mass left palm   MASTECTOMY  1975   RIGHT SIDE   TUBAL LIGATION     1960's    Social History   Socioeconomic History   Marital status: Widowed    Spouse name: Not on file   Number of children: Not on file   Years of education: Not on file   Highest education level: Not on file  Occupational History   Not on file  Tobacco Use   Smoking status: Former    Packs/day: 1.00    Years: 4.00    Pack years: 4.00    Types: Cigarettes    Quit date: 06/02/1969    Years since quitting: 51.5   Smokeless tobacco: Never  Substance and Sexual Activity   Alcohol use: No   Drug use: No   Sexual activity: Never  Other  Topics Concern   Not on file  Social History Narrative   Not on file   Social Determinants of Health   Financial Resource Strain: Not on file  Food Insecurity: Not on file  Transportation Needs: Not on file  Physical Activity: Not on file  Stress: Not on file  Social Connections: Not on file  Intimate Partner Violence: Not on file    Allergies  Allergen Reactions   Valium Shortness Of Breath and Other (See Comments)    "ALLERGIC," per [facility]   Codeine Other (See Comments)    "ALLERGIC," per [facility]   Crestor [Rosuvastatin Calcium] Other (See Comments)    "ALLERGIC," per [facility]   Doxycycline Other (See Comments)    "ALLERGIC," per [facility]   Ezetimibe-Simvastatin Other (See Comments)    Vytorin- "ALLERGIC," per [facility]   Lipitor [Atorvastatin Calcium] Other (See Comments)    "ALLERGIC," per [facility]   Sulfa Drugs Cross Reactors Other (See  Comments)    "ALLERGIC," per [facility]   Tequin Other (See Comments)    "ALLERGIC," per [facility]   Zocor [Simvastatin] Other (See Comments)    "ALLERGIC," per [facility]    Family History  Problem Relation Age of Onset   Alzheimer's disease Mother     Prior to Admission medications   Medication Sig Start Date End Date Taking? Authorizing Provider  amLODipine (NORVASC) 5 MG tablet Take 5 mg by mouth daily.    [provider]  Ascorbic Acid (C 500) 500 MG CHEW Chew 500 mg by mouth daily.    [provider]  Ascorbic Acid (VITAMIN C IMMUNE HEALTH PO) Take 1 tablet by mouth daily. Patient not taking: No sig reported    [provider]  aspirin 81 MG tablet Take 81 mg by mouth daily.    [provider]  furosemide (LASIX) 20 MG tablet Take 20 mg by mouth daily.    [provider]  levothyroxine (SYNTHROID, LEVOTHROID) 50 MCG tablet Take 50 mcg by mouth daily before breakfast.    [provider]  metoprolol tartrate (LOPRESSOR) 25 MG tablet Take 1 tablet (25 mg total) by mouth 2 (two) times daily. 11/08/20   Amin, Jeanella Flattery, MD  Polyethyl Glycol-Propyl Glycol (SYSTANE OP) Place 2 drops into both eyes every morning.    [provider]  pravastatin (PRAVACHOL) 20 MG tablet TAKE 1 TABLET BY MOUTH EVERYDAY AT BEDTIME 05/11/20   Lelon Perla, MD  senna-docusate (SENOKOT-S) 8.6-50 MG tablet Take 1 tablet by mouth at bedtime as needed for mild constipation. 11/08/20   Amin, Jeanella Flattery, MD  traZODone (DESYREL) 50 MG tablet Take 1 tablet (50 mg total) by mouth at bedtime as needed for sleep. 11/08/20   Amin, Jeanella Flattery, MD  triamterene-hydrochlorothiazide (MAXZIDE-25) 37.5-25 MG tablet Take 0.5 tablets by mouth daily.    [provider]    Physical Exam: Vitals:   11/27/20 1600 11/27/20 1615 11/27/20 1630 11/27/20 1645  BP: (!) 115/58 100/62 (!) 116/57 110/71  Pulse: 63 68 (!) 58 (!) 53  Resp:  20 (!) 21 14  Temp:      TempSrc:       SpO2: 100% 100% 100% 100%  Weight:      Height:         General:  Appears calm and comfortable and is in NAD Eyes:  PERRL, EOMI, normal lids, iris ENT:  grossly normal hearing, lips & tongue, mmm; no teeth  Neck:  no LAD, masses or thyromegaly; no carotid bruits  Cardiovascular:  RRR, no m/r/g. No LE edema.  Respiratory:   CTA bilaterally with no wheezes/rales/rhonchi.  Normal respiratory effort. Abdomen:  soft, NT, ND, NABS large bloody BM  Back:   normal alignment, no CVAT Skin:  no rash or induration seen on limited exam Musculoskeletal:  grossly normal tone BUE/BLE, good ROM, no bony abnormality Lower extremity:  No LE edema.  Limited foot exam with no ulcerations.  2+ distal pulses. She has a fluctuant mass, likely lipoma on her left medial lower leg.  Psychiatric:  grossly normal mood and affect, speech fluent and appropriate. Alert to person and place. Confusion with conversation.  Neurologic:  CN 2-12 grossly intact, moves all extremities in coordinated fashion, sensation intact    Radiological Exams on Admission: Independently reviewed - see discussion in A/P where applicable  No results found.  EKG: Independently reviewed.  NSR with rate 88; nonspecific ST changes with no evidence of acute ischemia   Labs on Admission: I have personally reviewed the available labs and imaging studies at the time of the admission.  Pertinent labs:  Potassium: 5.2 Sodium: 132 BUN: 72 and creatinine: 1.25 Fecal occult: positive    Assessment/Plan Principal Problem:   GI bleed -copious blood with BM in room -concern for UGIB with melena and elevated BUN -GI consulted, [facility] Dr. Paulita Fujita, in ER. I called Dr. Alessandra Bevels.  -starting protonix drip, NPO, progressive unit -trend H&H. Stable at admit.  -light IVF  Active Problems:   Hx of CABG -holding ASA -tele -continue lipitor when tolerating oral.     Hyperlipidemia -continue statin when no longer NPO     Hypothyroidism -continue synthroid when PO. TSH just checked a few weeks ago     HTN (hypertension)  -on soft side. Holding home meds in light of active bleed.   Hyperkalemia -light IVF and repeat bmp tonight.   Held all home meds, please restart when PO.   Body mass index is 24.8 kg/m.   Level of care: Progressive DVT prophylaxis:  SCDs Code Status: DNR, per paperwork from SNF and family, [name].  Family Communication: [name]: [phone] Disposition Plan:  The patient is from: SNF   Patient is currently: acutely ill Consults called: GI: Dr. Paulita Fujita  and discussed with Dr. Alessandra Bevels  Admission status:  inpatient    Orma Flaming MD [facility]   How to contact the [facility] Attending or Consulting provider [place] or covering provider during after hours [address], for this patient?  Check the care team in [facility] and look for a) attending/consulting [facility] provider listed and b) the [facility] team listed Log into [email or web address] and use 's universal password to access. If you do not have the password, please contact the hospital operator. Locate the [facility] provider you are looking for under [facility] and page to a number that you can be directly reached. If you still have difficulty reaching the provider, please page the [facility] (Director on Call) for the Hospitalists listed on amion for assistance.   11/27/2020, 5:47 PM

## 2020-11-27 NOTE — Progress Notes (Signed)
Patient arrived to unit. Moderate amount of bloody stools. Patient cleaned. CCMD notified. Patient oriented to room and equipment. VSS

## 2020-11-27 NOTE — ED Notes (Signed)
Provider in to evaluate for admission. Patient had large amount of bloody stool per provider and tech. Was cleaned. Vitals stable. No n/v. No distress.

## 2020-11-28 DIAGNOSIS — I10 Essential (primary) hypertension: Secondary | ICD-10-CM

## 2020-11-28 DIAGNOSIS — E039 Hypothyroidism, unspecified: Secondary | ICD-10-CM

## 2020-11-28 DIAGNOSIS — Z951 Presence of aortocoronary bypass graft: Secondary | ICD-10-CM

## 2020-11-28 LAB — HEMOGLOBIN AND HEMATOCRIT, BLOOD
HCT: 33.5 % — ABNORMAL LOW (ref 36.0–46.0)
HCT: 30.5 % — ABNORMAL LOW (ref 36.0–46.0)
HCT: 33 % — ABNORMAL LOW (ref 36.0–46.0)
HCT: 30.6 % — ABNORMAL LOW (ref 36.0–46.0)
HCT: 29.7 % — ABNORMAL LOW (ref 36.0–46.0)
Hemoglobin: 10.7 g/dL — ABNORMAL LOW (ref 12.0–15.0)
Hemoglobin: 9.9 g/dL — ABNORMAL LOW (ref 12.0–15.0)
Hemoglobin: 10.7 g/dL — ABNORMAL LOW (ref 12.0–15.0)
Hemoglobin: 10 g/dL — ABNORMAL LOW (ref 12.0–15.0)
Hemoglobin: 9.7 g/dL — ABNORMAL LOW (ref 12.0–15.0)

## 2020-11-28 LAB — BASIC METABOLIC PANEL
Anion gap: 12 (ref 5–15)
BUN: 70 mg/dL — ABNORMAL HIGH (ref 8–23)
CO2: 21 mmol/L — ABNORMAL LOW (ref 22–32)
Calcium: 9.2 mg/dL (ref 8.9–10.3)
Chloride: 101 mmol/L (ref 98–111)
Creatinine, Ser: 1.22 mg/dL — ABNORMAL HIGH (ref 0.44–1.00)
GFR, Estimated: 41 mL/min — ABNORMAL LOW (ref >60)
Glucose, Bld: 77 mg/dL (ref 70–99)
Potassium: 3.6 mmol/L (ref 3.5–5.1)
Sodium: 134 mmol/L — ABNORMAL LOW (ref 135–145)

## 2020-11-28 LAB — COMPREHENSIVE METABOLIC PANEL
ALT: 11 U/L (ref 0–44)
AST: 13 U/L — ABNORMAL LOW (ref 15–41)
Albumin: 3 g/dL — ABNORMAL LOW (ref 3.5–5.0)
Alkaline Phosphatase: 43 U/L (ref 38–126)
Anion gap: 11 (ref 5–15)
BUN: 65 mg/dL — ABNORMAL HIGH (ref 8–23)
CO2: 24 mmol/L (ref 22–32)
Calcium: 9.5 mg/dL (ref 8.9–10.3)
Chloride: 100 mmol/L (ref 98–111)
Creatinine, Ser: 1.17 mg/dL — ABNORMAL HIGH (ref 0.44–1.00)
GFR, Estimated: 44 mL/min — ABNORMAL LOW (ref >60)
Glucose, Bld: 83 mg/dL (ref 70–99)
Potassium: 3.7 mmol/L (ref 3.5–5.1)
Sodium: 135 mmol/L (ref 135–145)
Total Bilirubin: 0.6 mg/dL (ref 0.3–1.2)
Total Protein: 6.6 g/dL (ref 6.5–8.1)

## 2020-11-28 LAB — CBC
HCT: 34.5 % — ABNORMAL LOW (ref 36.0–46.0)
Hemoglobin: 11.2 g/dL — ABNORMAL LOW (ref 12.0–15.0)
MCH: 27.2 pg (ref 26.0–34.0)
MCHC: 32.5 g/dL (ref 30.0–36.0)
MCV: 83.7 fL (ref 80.0–100.0)
Platelets: 286 10*3/uL (ref 150–400)
RBC: 4.12 MIL/uL (ref 3.87–5.11)
RDW: 14.6 % (ref 11.5–15.5)
WBC: 5.9 10*3/uL (ref 4.0–10.5)
nRBC: 0 % (ref 0.0–0.2)

## 2020-11-28 MED ORDER — HYDROCORTISONE ACETATE 25 MG RE SUPP
25.0000 mg | Freq: Two times a day (BID) | RECTAL | Status: AC
  Administered 2020-11-28 – 2020-11-29 (×4): 25 mg via RECTAL
  Filled 2020-11-28 (×4): qty 1

## 2020-11-28 MED ORDER — LACTATED RINGERS IV SOLN: INTRAVENOUS | Status: DC

## 2020-11-28 NOTE — Plan of Care (Signed)
  Problem: Health Behavior/Discharge Planning: Goal: Ability to manage health-related needs will improve Outcome: Not Progressing   Problem: Clinical Measurements: Goal: Diagnostic test results will improve Outcome: Not Progressing

## 2020-11-28 NOTE — Progress Notes (Signed)
PROGRESS NOTE    TILDA SAMUDIO  ERD:[id number] DOB: 1925/01/01 DOA: 11/27/2020 PCP: Seward Carol, MD    No chief complaint on file.   Brief Narrative:   Natalie Bullock is a 87 y.o. female with medical history significant of HTN, CAD s/p CABG in 2002, hypothyroidism, bilateral knee OA and memory loss since last hospitalization who was seen for rectal bleeding that started yesterday. The SNF indicated she had some blood in her stool , was sent to the hospital for further evaluation.  GI consulted, recommended conservative management.   Assessment & Plan:   Principal Problem:   GI bleed Active Problems:   Hx of CABG   Hyperlipidemia   Hypothyroidism   HTN (hypertension)  GI bleed:  - drop in hemoglobin from 13 to 9.9.  - GI consulted,recommended conservative management.  - H&h every 4 hours .  Transfuse to keep hemoglobin greater than 8.  - gently hydrate and clears .    History of CABG Continue with the Lipitor. Aspirin on hold for GI bleed.    Hypertension Hold antihypertensives in view of GI bleed.   Hypothyroidism Continue with Synthroid.   Hyperkalemia Resolved    DVT prophylaxis: (scd's) Code Status: (Full code) Family Communication: none at bedside.  Disposition:   Status is: Inpatient  Remains inpatient appropriate because:Ongoing diagnostic testing needed not appropriate for outpatient work up, Unsafe d/c plan, and IV treatments appropriate due to intensity of illness or inability to take PO  Dispo: The patient is from: SNF              Anticipated d/c is to: SNF              Patient currently is not medically stable to d/c.   Difficult to place patient No       Consultants:  Gastroenterology.  Procedures: None.   Antimicrobials: none.    Subjective: No chest pain, GI BLEED overnight.   Objective: Vitals:   11/27/20 2039 11/28/20 0040 11/28/20 0338 11/28/20 0738  BP:  136/72 (!) 132/56 (!) 118/51  Pulse:  71 (!) 59 67  Resp:   20 16 10   Temp: (!) 97.4 F (36.3 C) 97.6 F (36.4 C) 97.9 F (36.6 C) 98.5 F (36.9 C)  TempSrc: Oral Oral Oral Oral  SpO2:  100% 100% 100%  Weight: 55.1 kg     Height: 5\' 3"  (1.6 m)       Intake/Output Summary (Last 24 hours) at 11/28/2020 1104 Last data filed at 11/28/2020 0237 Gross per 24 hour  Intake 803.82 ml  Output --  Net 803.82 ml   Filed Weights   11/27/20 1337 11/27/20 2039  Weight: 63.5 kg 55.1 kg    Examination:  General exam: Appears calm and comfortable  Respiratory system: Clear to auscultation. Respiratory effort normal. Cardiovascular system: S1 & S2 heard, RRR. No JVD, No pedal edema. Gastrointestinal system: Abdomen is nondistended, soft and nontender.  Normal bowel sounds heard. Central nervous system: Alert and oriented to person only.  Extremities: Symmetric 5 x 5 power. Skin: No rashes,  Psychiatry: Mood is appropriate.      Data Reviewed: I have personally reviewed following labs and imaging studies  CBC: Recent Labs  Lab 11/27/20 1401 11/27/20 2132 11/28/20 0110 11/28/20 0510 11/28/20 1004  WBC 6.7  --  5.9  --   --   NEUTROABS 5.1  --   --   --   --   HGB 13.4 11.4* 11.2* 10.7*  9.9*  HCT 41.8 34.5* 34.5* 33.5* 30.5*  MCV 86.4  --  83.7  --   --   PLT 261  --  286  --   --     Basic Metabolic Panel: Recent Labs  Lab 11/27/20 1401 11/27/20 2132 11/28/20 0110  NA 132* 134* 135  K 5.2* 3.6 3.7  CL 94* 101 100  CO2 22 21* 24  GLUCOSE 83 77 83  BUN 72* 70* 65*  CREATININE 1.25* 1.22* 1.17*  CALCIUM 9.9 9.2 9.5    GFR: Estimated Creatinine Clearance: 24.9 mL/min (A) (by C-G formula based on SCr of 1.17 mg/dL (H)).  Liver Function Tests: Recent Labs  Lab 11/27/20 1401 11/28/20 0110  AST 32 13*  ALT 17 11  ALKPHOS 48 43  BILITOT 1.9* 0.6  PROT 7.3 6.6  ALBUMIN 3.5 3.0*    CBG: No results for input(s): GLUCAP in the last 168 hours.   Recent Results (from the past 240 hour(s))  Resp Panel by RT-PCR (Flu A&B,  Covid) Nasopharyngeal Swab     Status: None   Collection Time: 11/27/20  2:10 PM   Specimen: Nasopharyngeal Swab; Nasopharyngeal(NP) swabs in vial transport medium  Result Value Ref Range Status   SARS Coronavirus 2 by RT PCR NEGATIVE NEGATIVE Final    Comment: (NOTE) SARS-CoV-2 target nucleic acids are NOT DETECTED.  The SARS-CoV-2 RNA is generally detectable in upper respiratory specimens during the acute phase of infection. The lowest concentration of SARS-CoV-2 viral copies this assay can detect is 138 copies/mL. A negative result does not preclude SARS-Cov-2 infection and should not be used as the sole basis for treatment or other patient management decisions. A negative result may occur with  improper specimen collection/handling, submission of specimen other than nasopharyngeal swab, presence of viral mutation(s) within the areas targeted by this assay, and inadequate number of viral copies(<138 copies/mL). A negative result must be combined with clinical observations, patient history, and epidemiological information. The expected result is Negative.  Fact Sheet for Patients:  EntrepreneurPulse.com.au  Fact Sheet for Healthcare Providers:  IncredibleEmployment.be  This test is no t yet approved or cleared by the [place] FDA and  has been authorized for detection and/or diagnosis of SARS-CoV-2 by FDA under an Emergency Use Authorization (EUA). This EUA will remain  in effect (meaning this test can be used) for the duration of the COVID-19 declaration under Section 564(b)(1) of the Act, 21 U.S.C.section 360bbb-3(b)(1), unless the authorization is terminated  or revoked sooner.       Influenza A by PCR NEGATIVE NEGATIVE Final   Influenza B by PCR NEGATIVE NEGATIVE Final    Comment: (NOTE) The Xpert Xpress SARS-CoV-2/FLU/RSV plus assay is intended as an aid in the diagnosis of influenza from Nasopharyngeal swab specimens and should  not be used as a sole basis for treatment. Nasal washings and aspirates are unacceptable for Xpert Xpress SARS-CoV-2/FLU/RSV testing.  Fact Sheet for Patients: EntrepreneurPulse.com.au  Fact Sheet for Healthcare Providers: IncredibleEmployment.be  This test is not yet approved or cleared by the [place] FDA and has been authorized for detection and/or diagnosis of SARS-CoV-2 by FDA under an Emergency Use Authorization (EUA). This EUA will remain in effect (meaning this test can be used) for the duration of the COVID-19 declaration under Section 564(b)(1) of the Act, 21 U.S.C. section 360bbb-3(b)(1), unless the authorization is terminated or revoked.  Performed at [facility], [place] [address] [place], [place] [address]  Radiology Studies: No results found.      Scheduled Meds:  hydrocortisone  25 mg Rectal BID   [START ON 12/01/2020] pantoprazole  40 mg Intravenous Q12H   Continuous Infusions:  pantoprazole 8 mg/hr (11/28/20 0212)     LOS: 1 day        Hosie Poisson, MD [facility]   To contact the attending provider between 7A-7P or the covering provider during after hours 7P-7A, please log into the web site [email or web address] and access using universal [place] password for that web site. If you do not have the password, please call the hospital operator.  11/28/2020, 11:04 AM

## 2020-11-28 NOTE — Progress Notes (Signed)
Patient having increased rectal bleeding which is bright red in nature.   When patient arrived to unit, patient was having moderate amount of dark red stools.   Notified Dr. Cyd Silence of change in status. Awaiting call back.   Will continue to monitor

## 2020-11-28 NOTE — Consult Note (Signed)
[facility] Gastroenterology Consultation Note  Referring Provider: No ref. provider found Primary Care Physician:  Seward Carol, MD  Reason for Consultation:  Hematochezia  HPI: Natalie Bullock is a 87 y.o. female whom we've been asked to see for hematochezia.  Patient is confused and we are unable to obtain history from her.  I spoke with patient's nurse, Angie, as well as patient's nephew/HCPOA [name] [phone]) to obtain history.  Patient apparently lived completely independently and able to attend to her ADLs until about 3 weeks ago when she experienced a fall.  She was hospitalized, became confused, had neurologic evaluation and eventually discharged to rehab facility.  There, at rehab facility, she had a few episodes of hematochezia over the past couple days.  She also reported some lower abdominal pain.  No prior GI bleeding.  No anticoagulants.  Unclear if prior colonoscopy.  Has had couple episodes of hematochezia since her admission.  No reported hematemesis or melena.   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 EF 60-70%   CARDIOVASCULAR STRESS TEST  Aug 2010   Normal; EF 74%   CORONARY ARTERY BYPASS GRAFT  12/2000   LEFT INTERNAL MAMMARY TO THE LAD, SAPHENOUS VEIN GRAFT TO THE FIRST DIAGONAL, SAPHENOUS VEIN GRAFT TO THE RAMUS INTERMEDIATE, AND A SEQUENTIAL SAPHENOUS VEIN GRAFT TO THE POSTERIOR DESCENDING AND POSTERIOR LATERAL BRANCHES.   MASS EXCISION  04/08/2011    Procedure: EXCISION MASS;  Surgeon: Wynonia Sours, MD;  Location: [place];  Service: Orthopedics;  Laterality: Left;  excision mass left palm   MASTECTOMY  1975   RIGHT SIDE   TUBAL LIGATION     1960's    Prior to Admission medications   Medication Sig Start Date End Date Taking? Authorizing Provider  acetaminophen (TYLENOL) 325 MG tablet Take 650 mg by mouth 3 (three) times daily.   Yes [provider]  amLODipine (NORVASC) 5 MG tablet Take 5 mg by mouth daily.   Yes [provider]  Ascorbic Acid (C 500) 500 MG CHEW Chew 500 mg by mouth daily.   Yes [provider]  diclofenac Sodium (VOLTAREN) 1 % GEL Apply 2-4 g topically 4 (four) times daily as needed (for bilateral knee pain).   Yes [provider]  furosemide (LASIX) 20 MG tablet Take 20 mg by mouth daily.   Yes [provider]  levothyroxine (SYNTHROID, LEVOTHROID) 50 MCG tablet Take 50 mcg by mouth daily before breakfast.   Yes [provider]  magnesium hydroxide (MILK OF MAGNESIA) 400 MG/5ML suspension Take 30 mLs by mouth daily as needed for mild constipation.   Yes [provider]  metoprolol tartrate (LOPRESSOR) 25 MG tablet Take 1 tablet (25 mg total) by mouth 2 (two) times daily. 11/08/20  Yes Amin, Ankit Chirag,  MD  Polyethyl Glycol-Propyl Glycol (SYSTANE OP) Place 2 drops into both eyes every morning.   Yes [provider]  pravastatin (PRAVACHOL) 20 MG tablet TAKE 1 TABLET BY MOUTH EVERYDAY AT BEDTIME Patient taking differently: Take 20 mg by mouth at bedtime. 05/11/20  Yes Lelon Perla, MD  senna-docusate (SENOKOT-S) 8.6-50 MG tablet Take 1 tablet by mouth at bedtime as needed for mild constipation. 11/08/20  Yes Amin, Jeanella Flattery, MD  Sodium Phosphates (RA SALINE ENEMA RE) Place 1 enema rectally daily as needed (for constipation not relieved by Dulcolax suppository- call MD if no relief from the enema).   Yes [provider]   triamterene-hydrochlorothiazide (MAXZIDE-25) 37.5-25 MG tablet Take 0.5 tablets by mouth daily.   Yes [provider]    Current Facility-Administered Medications  Medication Dose Route Frequency Provider Last Rate Last Admin   lactated ringers infusion   Intravenous Continuous Orma Flaming, MD 75 mL/hr at 11/27/20 2213 Restarted at 11/27/20 2213   [START ON 12/01/2020] pantoprazole (PROTONIX) injection 40 mg  40 mg Intravenous Q12H Orma Flaming, MD       pantoprozole (PROTONIX) 80 mg /NS 100 mL infusion  8 mg/hr Intravenous Continuous Orma Flaming, MD 10 mL/hr at 11/28/20 6295 8 mg/hr at 11/28/20 2841    Allergies as of 11/27/2020 - Review Complete 11/27/2020  Allergen Reaction Noted   Valium Shortness Of Breath and Other (See Comments) 11/13/2010   Codeine Other (See Comments) 09/11/2011   Crestor [rosuvastatin calcium] Other (See Comments) 04/02/2011   Doxycycline Other (See Comments) 11/13/2010   Ezetimibe-simvastatin Other (See Comments) 11/13/2010   Lipitor [atorvastatin calcium] Other (See Comments) 11/13/2010   Sulfa drugs cross reactors Other (See Comments) 11/13/2010   Tequin Other (See Comments) 11/13/2010   Zocor [simvastatin] Other (See Comments) 11/13/2010    Family History  Problem Relation Age of Onset   Alzheimer's disease Mother     Social History   Socioeconomic History   Marital status: Widowed    Spouse name: Not on file   Number of children: Not on file   Years of education: Not on file   Highest education level: Not on file  Occupational History   Not on file  Tobacco Use   Smoking status: Former    Packs/day: 1.00    Years: 4.00    Pack years: 4.00    Types: Cigarettes    Quit date: 06/02/1969    Years since quitting: 51.5   Smokeless tobacco: Never  Substance and Sexual Activity   Alcohol use: No   Drug use: No   Sexual activity: Never  Other Topics Concern   Not on file  Social History Narrative   Not on file   Social  Determinants of Health   Financial Resource Strain: Not on file  Food Insecurity: Not on file  Transportation Needs: Not on file  Physical Activity: Not on file  Stress: Not on file  Social Connections: Not on file  Intimate Partner Violence: Not on file    Review of Systems: Positive LKG:MWNUUV to obtain due to patient's confusion  Physical Exam: Vital signs in last 24 hours: Temp:  [97.4 F (36.3 C)-98.5 F (36.9 C)] 98.5 F (36.9 C) (06/29 0738) Pulse Rate:  [53-85] 67 (06/29 0738) Resp:  [10-23] 10 (06/29 0738) BP: (100-153)/(51-73) 118/51 (06/29 0738) SpO2:  [86 %-100 %] 100 % (06/29 0738) Weight:  [55.1 kg-63.5 kg] 55.1 kg (06/28 2039) Last BM Date: 11/28/20 General:   Awake but confused, can  speak but doesn't answer questions appropriately, NAD Head:  Normocephalic and atraumatic. Eyes:  Sclera clear, no icterus.   Conjunctiva pink. Ears:  Normal auditory acuity. Nose:  No deformity, discharge,  or lesions. Mouth:  No deformity or lesions.  Oropharynx pink & moist. Neck:  Supple; no masses or thyromegaly. Abdomen:  Soft, non-distended, mild lower abdominal tenderness. No masses, hepatosplenomegaly or hernias noted. Normal bowel sounds, without guarding, and without rebound.     Msk:  Symmetrical without gross deformities. Normal posture. Pulses:  Normal pulses noted. Extremities:  Without clubbing or edema. Neurologic:  Confused, disoriented Skin:  Intact without significant lesions or rashes. Cervical Nodes:  No significant cervical adenopathy. Psych:  Confused, disoriented   Lab Results: Recent Labs    11/27/20 1401 11/27/20 2132 11/28/20 0110 11/28/20 0510  WBC 6.7  --  5.9  --   HGB 13.4 11.4* 11.2* 10.7*  HCT 41.8 34.5* 34.5* 33.5*  PLT 261  --  286  --    BMET Recent Labs    11/27/20 1401 11/27/20 2132 11/28/20 0110  NA 132* 134* 135  K 5.2* 3.6 3.7  CL 94* 101 100  CO2 22 21* 24  GLUCOSE 83 77 83  BUN 72* 70* 65*  CREATININE 1.25* 1.22*  1.17*  CALCIUM 9.9 9.2 9.5   LFT Recent Labs    11/28/20 0110  PROT 6.6  ALBUMIN 3.0*  AST 13*  ALT 11  ALKPHOS 43  BILITOT 0.6   PT/INR Recent Labs    11/27/20 1401  LABPROT 13.0  INR 1.0    Studies/Results: No results found.  Impression:   Hematochezia.  Diverticulosis versus ischemic colitis are leading considerations.  BUN is elevated, but there are no other supportive features (hemodynamic instability, etc) to support brisk upper GI bleed. Acute blood loss anemia. Confusion, recent (< 1 month) onset, unclear etiology.  Plan:   I discussed case at length with patient's nephew/HCPOA, Mark.  We both agree that upfront aggressive intervention (e.g., colonoscopy) is not a good idea at this point. Not really sure patient could complete bowel prep even if colonoscopy were desired, given her confusion. Will treat for possible hemorrhoids and monitor supportively. If bleeding persists, would pursue tagged RBC study as next step in management. [facility] will follow.   LOS: 1 day   Alyan Hartline M  11/28/2020, 10:14 AM  Cell [phone] If no answer or after 5 PM call [phone]

## 2020-11-29 DIAGNOSIS — R627 Adult failure to thrive: Secondary | ICD-10-CM

## 2020-11-29 DIAGNOSIS — Z66 Do not resuscitate: Secondary | ICD-10-CM

## 2020-11-29 DIAGNOSIS — R531 Weakness: Secondary | ICD-10-CM

## 2020-11-29 LAB — CBC WITH DIFFERENTIAL/PLATELET
Abs Immature Granulocytes: 0.03 10*3/uL (ref 0.00–0.07)
Basophils Absolute: 0 10*3/uL (ref 0.0–0.1)
Basophils Relative: 0 %
Eosinophils Absolute: 0.1 10*3/uL (ref 0.0–0.5)
Eosinophils Relative: 2 %
HCT: 29.2 % — ABNORMAL LOW (ref 36.0–46.0)
Hemoglobin: 9.7 g/dL — ABNORMAL LOW (ref 12.0–15.0)
Immature Granulocytes: 1 %
Lymphocytes Relative: 18 %
Lymphs Abs: 1 10*3/uL (ref 0.7–4.0)
MCH: 27.7 pg (ref 26.0–34.0)
MCHC: 33.2 g/dL (ref 30.0–36.0)
MCV: 83.4 fL (ref 80.0–100.0)
Monocytes Absolute: 0.5 10*3/uL (ref 0.1–1.0)
Monocytes Relative: 9 %
Neutro Abs: 3.7 10*3/uL (ref 1.7–7.7)
Neutrophils Relative %: 70 %
Platelets: 247 10*3/uL (ref 150–400)
RBC: 3.5 MIL/uL — ABNORMAL LOW (ref 3.87–5.11)
RDW: 14.7 % (ref 11.5–15.5)
WBC: 5.3 10*3/uL (ref 4.0–10.5)
nRBC: 0 % (ref 0.0–0.2)

## 2020-11-29 LAB — BASIC METABOLIC PANEL
Anion gap: 7 (ref 5–15)
BUN: 42 mg/dL — ABNORMAL HIGH (ref 8–23)
CO2: 25 mmol/L (ref 22–32)
Calcium: 8.9 mg/dL (ref 8.9–10.3)
Chloride: 101 mmol/L (ref 98–111)
Creatinine, Ser: 0.99 mg/dL (ref 0.44–1.00)
GFR, Estimated: 53 mL/min — ABNORMAL LOW (ref >60)
Glucose, Bld: 93 mg/dL (ref 70–99)
Potassium: 3.5 mmol/L (ref 3.5–5.1)
Sodium: 133 mmol/L — ABNORMAL LOW (ref 135–145)

## 2020-11-29 MED ORDER — PANTOPRAZOLE SODIUM 40 MG IV SOLR
40.0000 mg | Freq: Two times a day (BID) | INTRAVENOUS | Status: DC
  Administered 2020-11-29 – 2020-11-30 (×2): 40 mg via INTRAVENOUS
  Filled 2020-11-29 (×2): qty 40

## 2020-11-29 MED ORDER — LEVOTHYROXINE SODIUM 50 MCG PO TABS
50.0000 ug | ORAL_TABLET | Freq: Every day | ORAL | Status: DC
  Administered 2020-11-29 – 2020-11-30 (×2): 50 ug via ORAL
  Filled 2020-11-29 (×2): qty 1

## 2020-11-29 NOTE — Progress Notes (Signed)
Subjective: Much less bleeding, stool looked brown per nursing.  Objective: Vital signs in last 24 hours: Temp:  [97.7 F (36.5 C)-98.7 F (37.1 C)] 98 F (36.7 C) (06/30 0823) Pulse Rate:  [62-85] 62 (06/30 0823) Resp:  [13-20] 16 (06/30 0823) BP: (103-135)/(56-90) 134/65 (06/30 0823) SpO2:  [97 %-100 %] 100 % (06/30 0823) Weight change:  Last BM Date: 11/28/20  PE: GEN:  Elderly, more alert, NAD ABD:  Soft  Lab Results: CBC    Component Value Date/Time   WBC 5.3 11/29/2020 0045   RBC 3.50 (L) 11/29/2020 0045   HGB 9.7 (L) 11/29/2020 0045   HCT 29.2 (L) 11/29/2020 0045   PLT 247 11/29/2020 0045   MCV 83.4 11/29/2020 0045   MCH 27.7 11/29/2020 0045   MCHC 33.2 11/29/2020 0045   RDW 14.7 11/29/2020 0045   LYMPHSABS 1.0 11/29/2020 0045   MONOABS 0.5 11/29/2020 0045   EOSABS 0.1 11/29/2020 0045   BASOSABS 0.0 11/29/2020 0045  CMP     Component Value Date/Time   NA 133 (L) 11/29/2020 0045   K 3.5 11/29/2020 0045   CL 101 11/29/2020 0045   CO2 25 11/29/2020 0045   GLUCOSE 93 11/29/2020 0045   BUN 42 (H) 11/29/2020 0045   CREATININE 0.99 11/29/2020 0045   CALCIUM 8.9 11/29/2020 0045   CALCIUM 9.5 09/13/2011 1520   PROT 6.6 11/28/2020 0110   ALBUMIN 3.0 (L) 11/28/2020 0110   AST 13 (L) 11/28/2020 0110   ALT 11 11/28/2020 0110   ALKPHOS 43 11/28/2020 0110   BILITOT 0.6 11/28/2020 0110   GFRNONAA 53 (L) 11/29/2020 0045   GFRAA >60 10/08/2017 1815    Assessment:   Hematochezia.  Much improved over past 24 hours.  Diverticulosis versus ischemic colitis are leading considerations.  BUN is elevated, but there are no other supportive features (hemodynamic instability, etc) to support brisk upper GI bleed. Acute blood loss anemia. Confusion, recent (< 1 month) onset, unclear etiology. Much improved after the past 24 hours as well.  Plan:   I have discussed case with Angie, patient's nurse, as well as [name] (patient's nephew and HCPOA with update. Advise ongoing  medical management and advance diet as tolerated. [facility] will follow.   Landry Dyke 11/29/2020, 11:09 AM   Cell [phone] If no answer or after 5 PM call [phone]

## 2020-11-29 NOTE — Consult Note (Signed)
Palliative Care Consult Note                                  Date: 11/29/2020   Patient Name: Natalie Bullock  DOB: 06-27-1924  MRN: [id number]  Age / Sex: 87 y.o., female  PCP: Seward Carol, MD Referring Physician: Hosie Poisson, MD  Reason for Consultation: Establishing goals of care  HPI/Patient Profile: 87 y.o. female  with past medical history of HTN, CAD s/p CABG in 2002, hypothyroidism, bilateral knee OA and memory loss since last hospitalization who was admitted from the SNF ([facility]) on 11/27/2020 with rectal bleeding.   Significant decline in hemoglobin from 13.4-9.7.  Has been seen by GI and after discussion with her healthcare power of attorney/nephew [name], they have elected conservative treatment with no colonoscopy.  Possible tagged RBC scan if she bleeds again.  Past Medical History:  Diagnosis Date   Anemia    Arthritis    osteoarthritis   Breast cancer ([place]) 1975   Right sided mastectomy   Coronary artery disease    H/O hypokalemia    Hx of CABG    pt states she had 5 bypasses   Hyperlipidemia    Hypertension    Hypothyroidism    IHD (ischemic heart disease)    with CABG in August 2002   Myocardial infarction [facility])    Non Q-wave MI   Vitamin D deficiency     Social History   Socioeconomic History   Marital status: Widowed    Spouse name: Not on file   Number of children: Not on file   Years of education: Not on file   Highest education level: Not on file  Occupational History   Not on file  Tobacco Use   Smoking status: Former    Packs/day: 1.00    Years: 4.00    Pack years: 4.00    Types: Cigarettes    Quit date: 06/02/1969    Years since quitting: 51.5   Smokeless tobacco: Never  Substance and Sexual Activity   Alcohol use: No   Drug use: No   Sexual activity: Never  Other Topics Concern   Not on file  Social History Narrative   Not on file   Social Determinants of Health    Financial Resource Strain: Not on file  Food Insecurity: Not on file  Transportation Needs: Not on file  Physical Activity: Not on file  Stress: Not on file  Social Connections: Not on file    Family History  Problem Relation Age of Onset   Alzheimer's disease Mother     Subjective:   This NP Walden Field reviewed medical records, received report from team, assessed the patient and then meet at the patient's bedside  to discuss diagnosis, prognosis, GOC, EOL wishes disposition and options.   Concept of Palliative Care was introduced as specialized medical care for people and their families living with serious illness.  If focuses on providing relief from the symptoms and stress of a serious illness.  The goal is to improve quality of life for both the patient and the family. Values and goals of care important to patient and family were attempted to be elicited.  Created space and opportunity for patient and family to explore thoughts and feelings regarding current medical situation   Today's Discussion: The patient's mental status, while technically oriented x3, seems to be a bit off.  She  has short-term memory issues of even a few minutes.  She was difficult to direct towards attempts at conversations related to goals.  I called Mark, the patient's HC POA/nephew.  He discussed with me that the patient was "normal" until about 3 weeks ago when she had a fall and altered mental status.  He states that a close friend/patient's neighbor, Lynnell Chad, is a Marine scientist at [employer] and assist him with healthcare decisions.  He notes that after discussions with her and do some research he is found that incident such as a fall can often cause a rapid onset/progression of dementia or altered mental status.  We discussed repeated insults idea that affect mental status and functioning.  He gives an example of the patient asking about her son Dellis Filbert and when he will visit, who has been  deceased for 20 years.  We have made an appointment for 10:00 in the morning on Friday, July 1 for a family meeting in the patient's room.  The patient's nephew [name] states he does have legal healthcare power of attorney paperwork and he will bring that tomorrow to be entered in the system.  Further discussions on goals to occur at that time.  Life Review: To occur tomorrow at family meeting  Patient Values: To be further elicited at family meeting  Patient/Family Understanding of Illness: Patient's nephew [name] understands that she has had a significant change in mental status after her fall.  He knows that she is not fully "in her right mind".  He understands that she has had some bleeding and that they are monitoring and electing to not do a colonoscopy.  Further understanding of current illness, prognosis, disease trajectory to be elicited tomorrow at family meeting.  Review of Systems  Unable to perform ROS: Mental status change : inconsistency in answers calls into doubt reliability of attempted ROS.  Objective:   Primary Diagnoses: Present on Admission:  Hyperlipidemia  Hypothyroidism  HTN (hypertension)  GI bleed   Scheduled Meds:  hydrocortisone  25 mg Rectal BID   levothyroxine  50 mcg Oral QAC breakfast   [START ON 12/01/2020] pantoprazole  40 mg Intravenous Q12H    Continuous Infusions:  lactated ringers 50 mL/hr at 11/29/20 0549   pantoprazole 8 mg/hr (11/29/20 0549)    PRN Meds:   Allergies  Allergen Reactions   Valium Shortness Of Breath and Other (See Comments)    "ALLERGIC," per [facility]   Codeine Other (See Comments)    "ALLERGIC," per [facility]   Crestor [Rosuvastatin Calcium] Other (See Comments)    "ALLERGIC," per [facility]   Doxycycline Other (See Comments)    "ALLERGIC," per [facility]   Ezetimibe-Simvastatin Other (See Comments)    Vytorin- "ALLERGIC," per [facility]   Lipitor [Atorvastatin Calcium] Other (See Comments)    "ALLERGIC," per [facility]   Sulfa Drugs Cross Reactors  Other (See Comments)    "ALLERGIC," per [facility]   Tequin Other (See Comments)    "ALLERGIC," per [facility]   Zocor [Simvastatin] Other (See Comments)    "ALLERGIC," per [facility]    Physical Exam Vitals and nursing note reviewed.  Constitutional:      General: She is not in acute distress.    Appearance: Normal appearance. She is not toxic-appearing.  HENT:     Head: Normocephalic and atraumatic.  Cardiovascular:     Rate and Rhythm: Normal rate and regular rhythm.     Heart sounds: Normal heart sounds.  Pulmonary:     Effort: Pulmonary effort  is normal.     Breath sounds: Normal breath sounds.  Abdominal:     General: Abdomen is flat.     Palpations: Abdomen is soft.     Tenderness: There is no abdominal tenderness.  Skin:    General: Skin is warm and dry.  Neurological:     Mental Status: She is alert.  Psychiatric:        Mood and Affect: Mood normal.        Behavior: Behavior normal.    Vital Signs:  BP 134/65 (BP Location: Left Arm)   Pulse 62   Temp 98 F (36.7 C) (Oral)   Resp 16   Ht 5\' 3"  (1.6 m)   Wt 55.1 kg   SpO2 100%   BMI 21.52 kg/m  Pain Scale: 0-10   Pain Score: 2   SpO2: SpO2: 100 % O2 Device:SpO2: 100 % O2 Flow Rate: .O2 Flow Rate (L/min): 0 L/min  IO: Intake/output summary:  Intake/Output Summary (Last 24 hours) at 11/29/2020 1052 Last data filed at 11/29/2020 0549 Gross per 24 hour  Intake 1104.36 ml  Output --  Net 1104.36 ml    LBM: Last BM Date: 11/28/20 Baseline Weight: Weight: 63.5 kg Most recent weight: Weight: 55.1 kg      Palliative Assessment/Data: 50%   Advanced Care Planning:   Primary Decision Maker: HCPOA  Code Status/Advance Care Planning: DNR  A discussion will be had tomorrow regarding advanced directives. Concepts specific to code status, artifical feeding and hydration, continued IV antibiotics and rehospitalization will be had.  The difference between an aggressive medical intervention path and a palliative comfort care  path for this patient at this time will also be discussed. The MOST form will be introduced and discussed.  Decisions/Changes to ACP: None at this time Remains DNR  Assessment & Plan:   I have reviewed the medical record, interviewed the patient and family, and examined the patient. The following aspects are pertinent.  Impression: Previously independently functioning older adult female with a fall and mental status changes 3 weeks ago discharged to SNF for rehab.  She now presents with GI bleed and significant drop in hemoglobin of about 4 g.  Opted for conservative GI management and no endoscopic evaluation.  Possibility of tagged RBC scan. The patient's nephew/healthcare power of attorney understands her significant decline recently.  He does have official paperwork and will bring up tomorrow to a family meeting.  At that time we will discuss goals of care, wishes, patient's life story, and make further recommendations.  SUMMARY OF RECOMMENDATIONS   Remain DNR Further recommendations to follow family meeting tomorrow  Symptom Management:  Per attending, no overt symptom management needs identified at this time.  Palliative Prophylaxis:  Bowel Regimen and Frequent Pain Assessment  Additional Recommendations (Limitations, Scope, Preferences): Conservative GI care Further recommendations on scope and preferences to be discerned tomorrow.  Psycho-social/Spiritual:  Additional Recommendations: Caregiving  Support/Resources  Prognosis:  Unable to determine  Discharge Planning:  To Be Determined   Discussed with: Dr. Karleen Hampshire (attending) and Agnela (RN)   Thank you for allowing [place] to participate in the care of Courtney Paris PMT will continue to support holistically.  Time In: 9:45 Time Out: 11:00 Time Total: 75 min  Greater than 50%  of this time was spent counseling and coordinating care related to the above assessment and plan.  Signed by: Walden Field, NP Palliative  Medicine Team  Team Phone # [phone] (Nights/Weekends)  11/29/2020, 10:52 AM

## 2020-11-29 NOTE — Progress Notes (Signed)
PROGRESS NOTE    Natalie Bullock  AJO:[id number] DOB: 1924-12-19 DOA: 11/27/2020 PCP: Seward Carol, MD    No chief complaint on file.   Brief Narrative:   Natalie Bullock is a 87 y.o. female with medical history significant of HTN, CAD s/p CABG in 2002, hypothyroidism, bilateral knee OA and memory loss since last hospitalization who was seen for rectal bleeding that started yesterday. The SNF indicated she had some blood in her stool , was sent to the hospital for further evaluation.  GI consulted, recommended conservative management.   Assessment & Plan:   Principal Problem:   GI bleed Active Problems:   Hx of CABG   Hyperlipidemia   Hypothyroidism   HTN (hypertension)  GI bleed:  - drop in hemoglobin from 13 to 9.7, Hemoglobin stabilized around 9.  - GI consulted,recommended conservative management.  Transfuse to keep hemoglobin greater than 8.  - gently hydrated, started on clears and advance as tolerated.    History of CABG Continue with the Lipitor. Aspirin on hold for GI bleed.    Hypertension BP parameters are stable. We have not started her home meds.    Hypothyroidism Continue with Synthroid.   Hyperkalemia Resolved.  Mild AKI;  Possibly from GI bleed and dehydration.  Creatinine back to baseline. .     DVT prophylaxis: (scd's) Code Status: (Full code) Family Communication: none at bedside.  Disposition:   Status is: Inpatient  Remains inpatient appropriate because:Ongoing diagnostic testing needed not appropriate for outpatient work up, Unsafe d/c plan, and IV treatments appropriate due to intensity of illness or inability to take PO  Dispo: The patient is from: SNF              Anticipated d/c is to: SNF              Patient currently is not medically stable to d/c.   Difficult to place patient No       Consultants:  Gastroenterology.  Procedures: None.   Antimicrobials: none.    Subjective: No chest pain.   Objective: Vitals:    11/28/20 2136 11/28/20 2331 11/29/20 0321 11/29/20 0823  BP: 122/66 (!) 117/56 125/74 134/65  Pulse: 66 65 80 62  Resp: 19 20 20 16   Temp: 97.7 F (36.5 C) 97.7 F (36.5 C) 97.7 F (36.5 C) 98 F (36.7 C)  TempSrc: Oral Oral Oral Oral  SpO2: 97% 97% 100% 100%  Weight:      Height:        Intake/Output Summary (Last 24 hours) at 11/29/2020 1141 Last data filed at 11/29/2020 0549 Gross per 24 hour  Intake 1104.36 ml  Output --  Net 1104.36 ml    Filed Weights   11/27/20 1337 11/27/20 2039  Weight: 63.5 kg 55.1 kg    Examination:  General exam: Calm and comfortable Respiratory system: Clear to auscultation bilaterally, no wheezing or rhonchi Cardiovascular system: S1-S2 heard, regular rate rhythm, no JVD abdomen Gastrointestinal system: Soft, nontender bowel sounds normal Central nervous system: Alert and oriented to person and place Extremities: No pedal edema Skin: No rashes seen,  Psychiatry: Mood is appropriate    Data Reviewed: I have personally reviewed following labs and imaging studies  CBC: Recent Labs  Lab 11/27/20 1401 11/27/20 2132 11/28/20 0110 11/28/20 0510 11/28/20 1004 11/28/20 1351 11/28/20 1804 11/28/20 2137 11/29/20 0045  WBC 6.7  --  5.9  --   --   --   --   --  5.3  NEUTROABS 5.1  --   --   --   --   --   --   --  3.7  HGB 13.4   < > 11.2*   < > 9.9* 10.7* 10.0* 9.7* 9.7*  HCT 41.8   < > 34.5*   < > 30.5* 33.0* 30.6* 29.7* 29.2*  MCV 86.4  --  83.7  --   --   --   --   --  83.4  PLT 261  --  286  --   --   --   --   --  247   < > = values in this interval not displayed.     Basic Metabolic Panel: Recent Labs  Lab 11/27/20 1401 11/27/20 2132 11/28/20 0110 11/29/20 0045  NA 132* 134* 135 133*  K 5.2* 3.6 3.7 3.5  CL 94* 101 100 101  CO2 22 21* 24 25  GLUCOSE 83 77 83 93  BUN 72* 70* 65* 42*  CREATININE 1.25* 1.22* 1.17* 0.99  CALCIUM 9.9 9.2 9.5 8.9     GFR: Estimated Creatinine Clearance: 29.4 mL/min (by C-G formula  based on SCr of 0.99 mg/dL).  Liver Function Tests: Recent Labs  Lab 11/27/20 1401 11/28/20 0110  AST 32 13*  ALT 17 11  ALKPHOS 48 43  BILITOT 1.9* 0.6  PROT 7.3 6.6  ALBUMIN 3.5 3.0*     CBG: No results for input(s): GLUCAP in the last 168 hours.   Recent Results (from the past 240 hour(s))  Resp Panel by RT-PCR (Flu A&B, Covid) Nasopharyngeal Swab     Status: None   Collection Time: 11/27/20  2:10 PM   Specimen: Nasopharyngeal Swab; Nasopharyngeal(NP) swabs in vial transport medium  Result Value Ref Range Status   SARS Coronavirus 2 by RT PCR NEGATIVE NEGATIVE Final    Comment: (NOTE) SARS-CoV-2 target nucleic acids are NOT DETECTED.  The SARS-CoV-2 RNA is generally detectable in upper respiratory specimens during the acute phase of infection. The lowest concentration of SARS-CoV-2 viral copies this assay can detect is 138 copies/mL. A negative result does not preclude SARS-Cov-2 infection and should not be used as the sole basis for treatment or other patient management decisions. A negative result may occur with  improper specimen collection/handling, submission of specimen other than nasopharyngeal swab, presence of viral mutation(s) within the areas targeted by this assay, and inadequate number of viral copies(<138 copies/mL). A negative result must be combined with clinical observations, patient history, and epidemiological information. The expected result is Negative.  Fact Sheet for Patients:  EntrepreneurPulse.com.au  Fact Sheet for Healthcare Providers:  IncredibleEmployment.be  This test is no t yet approved or cleared by the [place] FDA and  has been authorized for detection and/or diagnosis of SARS-CoV-2 by FDA under an Emergency Use Authorization (EUA). This EUA will remain  in effect (meaning this test can be used) for the duration of the COVID-19 declaration under Section 564(b)(1) of the Act,  21 U.S.C.section 360bbb-3(b)(1), unless the authorization is terminated  or revoked sooner.       Influenza A by PCR NEGATIVE NEGATIVE Final   Influenza B by PCR NEGATIVE NEGATIVE Final    Comment: (NOTE) The Xpert Xpress SARS-CoV-2/FLU/RSV plus assay is intended as an aid in the diagnosis of influenza from Nasopharyngeal swab specimens and should not be used as a sole basis for treatment. Nasal washings and aspirates are unacceptable for Xpert Xpress SARS-CoV-2/FLU/RSV testing.  Fact Sheet for Patients: EntrepreneurPulse.com.au  Fact  Sheet for Healthcare Providers: IncredibleEmployment.be  This test is not yet approved or cleared by the [place] and has been authorized for detection and/or diagnosis of SARS-CoV-2 by FDA under an Emergency Use Authorization (EUA). This EUA will remain in effect (meaning this test can be used) for the duration of the COVID-19 declaration under Section 564(b)(1) of the Act, 21 U.S.C. section 360bbb-3(b)(1), unless the authorization is terminated or revoked.  Performed at [facility], [place] [address], [place], [place] [address]           Radiology Studies: No results found.      Scheduled Meds:  hydrocortisone  25 mg Rectal BID   levothyroxine  50 mcg Oral QAC breakfast   [START ON 12/01/2020] pantoprazole  40 mg Intravenous Q12H   Continuous Infusions:  lactated ringers 50 mL/hr at 11/29/20 0549   pantoprazole 8 mg/hr (11/29/20 0549)     LOS: 2 days        Hosie Poisson, MD [facility]   To contact the attending provider between 7A-7P or the covering provider during after hours 7P-7A, please log into the web site [email or web address] and access using universal [place] password for that web site. If you do not have the password, please call the hospital operator.  11/29/2020, 11:41 AM

## 2020-11-29 NOTE — TOC Initial Note (Signed)
Transition of Care [facility]) - Initial/Assessment Note    Patient Details  Name: Natalie Bullock MRN: [id number] Date of Birth: 07/18/1924  Transition of Care [facility]) CM/SW Contact:    Vinie Sill, LCSW Phone Number: 11/29/2020, 11:25 AM  Clinical Narrative:                  CSW called-spoke with patient's nephew [name]. He confirmed patient is from [facility] and wants her to return there once medically stable.   Thurmond Butts, MSW, LCSW Clinical Social Worker    Expected Discharge Plan: Skilled Nursing Facility Barriers to Discharge: Continued Medical Work up   Patient Goals and CMS Choice        Expected Discharge Plan and Services Expected Discharge Plan: [place] In-house Referral: Clinical Social Work                                            Prior Living Arrangements/Services   Lives with:: Facility Resident Patient language and need for interpreter reviewed:: No        Need for Family Participation in Patient Care: Yes (Comment) Care giver support system in place?: Yes (comment)   Criminal Activity/Legal Involvement Pertinent to Current Situation/Hospitalization: No - Comment as needed  Activities of Daily Living Home Assistive Devices/Equipment: Environmental consultant (specify type), Wheelchair ADL Screening (condition at time of admission) Patient's cognitive ability adequate to safely complete daily activities?: Yes Is the patient deaf or have difficulty hearing?: Yes Does the patient have difficulty seeing, even when wearing glasses/contacts?: No Does the patient have difficulty concentrating, remembering, or making decisions?: Yes Patient able to express need for assistance with ADLs?: Yes Does the patient have difficulty dressing or bathing?: Yes Independently performs ADLs?: No Does the patient have difficulty walking or climbing stairs?: Yes Weakness of Legs: Both Weakness of Arms/Hands: Both  Permission Sought/Granted Permission  sought to share information with : Family Supports Permission granted to share information with : Yes, Verbal Permission Granted  Share Information with NAME: [name]           Emotional Assessment       Orientation: : Oriented to Place, Oriented to Self, Oriented to  Time Alcohol / Substance Use: Not Applicable Psych Involvement: No (comment)  Admission diagnosis:  GI bleed [K92.2] Gastrointestinal hemorrhage, unspecified gastrointestinal hemorrhage type [K92.2] Patient Active Problem List   Diagnosis Date Noted   GI bleed 11/27/2020   Encephalopathy 11/03/2020   Fall    Orthostasis    Mitral regurgitation 03/07/2014   Near syncope 03/26/2013   Syncope 05/19/2012   Delirium 09/12/2011   HTN (hypertension) 08/17/2011   HX: breast cancer 08/14/2011   Hypothyroidism 08/14/2011   Hx of CABG 11/19/2010   Hyperlipidemia 11/19/2010   PCP:  Seward Carol, MD Pharmacy:   [place], [place] - [address] [place] [address] Phone: [phone] Fax: [phone]  [id number] - [place], [place] - [place] [address] [place] [address] Phone: [phone] Fax: [phone]     Social Determinants of Health (SDOH) Interventions    Readmission Risk Interventions No flowsheet data found.

## 2020-11-29 NOTE — Progress Notes (Signed)
Physical Therapy Evaluation  Patient Details Name: Natalie Bullock MRN: [id number] DOB: 1925-03-25 Today's Date: 11/29/2020   History of Present Illness  Pt is a 87 y/o female admitted 6/28 from SNF secondary to GI bleed. PMH includes memory loss, CAD s/p CABG, and HTN.  Clinical Impression  Pt admitted secondary to problem above with deficits below. Pt very resistive to all mobility tasks. Would also swat at PT at times, but then would be pleasant right after. Required max A to roll. Performed partial sit with mod A for trunk elevation, but then abruptly laid back down. Pt from SNF and likely close to her baseline. Recommend return to SNF at d/c and all further needs can be addressed there. Will sign off. If needs change, please re-consult.     Follow Up Recommendations SNF;Supervision/Assistance - 24 hour (return to [facility])    Financial risk analyst (measurements PT);Wheelchair cushion (measurements PT)    Recommendations for Other Services       Precautions / Restrictions Precautions Precautions: Fall Restrictions Weight Bearing Restrictions: No      Mobility  Bed Mobility Overal bed mobility: Needs Assistance Bed Mobility: Rolling;Supine to Sit Rolling: Mod assist;Max assist         General bed mobility comments: Attempted bed mobility multiple times, but pt very resistive. Required max A to roll. Attempted to come to sitting and performed partial sit with mod A, however, then pt abruptly returning to supine and "shooing" PT out of room. further mobility deferred.    Transfers                    Ambulation/Gait                Stairs            Wheelchair Mobility    Modified Rankin (Stroke Patients Only)       Balance                                             Pertinent Vitals/Pain Pain Assessment: Faces Faces Pain Scale: Hurts little more Pain Location: bilateral knees Pain Descriptors /  Indicators: Grimacing;Sore Pain Intervention(s): Limited activity within patient's tolerance;Monitored during session;Repositioned    Home Living Family/patient expects to be discharged to:: Skilled nursing facility                 Additional Comments: From [employer]    Prior Function Level of Independence: Needs assistance         Comments: Pt unable to report, but assume she likely required assist for mobility tasks.     Hand Dominance        Extremity/Trunk Assessment   Upper Extremity Assessment Upper Extremity Assessment: Generalized weakness (noted volutary movement of BUE)    Lower Extremity Assessment Lower Extremity Assessment: Generalized weakness (voluntary movement noted in BLE.)       Communication   Communication: HOH  Cognition Arousal/Alertness: Awake/alert Behavior During Therapy: Agitated;WFL for tasks assessed/performed Overall Cognitive Status: No family/caregiver present to determine baseline cognitive functioning                                 General Comments: Pt kept asking "who sent you" and "what are you doing here". Very tangential. Would be agitated and  swatting and then immediately was pleasant.      General Comments      Exercises     Assessment/Plan    PT Assessment All further PT needs can be met in the next venue of care  PT Problem List Decreased strength;Decreased balance;Decreased activity tolerance;Decreased mobility;Decreased knowledge of use of DME;Decreased knowledge of precautions       PT Treatment Interventions      PT Goals (Current goals can be found in the Care Plan section)  Acute Rehab PT Goals PT Goal Formulation: Patient unable to participate in goal setting Time For Goal Achievement: 11/29/20 Potential to Achieve Goals: Fair    Frequency     Barriers to discharge        Co-evaluation               AM-PAC PT "6 Clicks" Mobility  Outcome Measure Help needed turning  from your back to your side while in a flat bed without using bedrails?: A Lot Help needed moving from lying on your back to sitting on the side of a flat bed without using bedrails?: A Lot Help needed moving to and from a bed to a chair (including a wheelchair)?: A Lot Help needed standing up from a chair using your arms (e.g., wheelchair or bedside chair)?: A Lot Help needed to walk in hospital room?: Total Help needed climbing 3-5 steps with a railing? : Total 6 Click Score: 10    End of Session Equipment Utilized During Treatment: Gait belt Activity Tolerance: Other (comment) (limited secondary to confusion) Patient left: in bed;with call bell/phone within reach;with bed alarm set Nurse Communication: Mobility status PT Visit Diagnosis: Other abnormalities of gait and mobility (R26.89);Difficulty in walking, not elsewhere classified (R26.2)    Time: 1350-1410 PT Time Calculation (min) (ACUTE ONLY): 20 min   Charges:   PT Evaluation $PT Eval Moderate Complexity: 1 Mod          Reuel Derby, PT, DPT  Acute Rehabilitation Services  Pager: [phone] Office: [phone]   Rudean Hitt 11/29/2020, 2:37 PM

## 2020-11-30 DIAGNOSIS — R404 Transient alteration of awareness: Secondary | ICD-10-CM | POA: Diagnosis not present

## 2020-11-30 DIAGNOSIS — M6281 Muscle weakness (generalized): Secondary | ICD-10-CM | POA: Diagnosis not present

## 2020-11-30 DIAGNOSIS — G459 Transient cerebral ischemic attack, unspecified: Secondary | ICD-10-CM | POA: Diagnosis not present

## 2020-11-30 DIAGNOSIS — Z881 Allergy status to other antibiotic agents status: Secondary | ICD-10-CM | POA: Diagnosis not present

## 2020-11-30 DIAGNOSIS — I69328 Other speech and language deficits following cerebral infarction: Secondary | ICD-10-CM | POA: Diagnosis not present

## 2020-11-30 DIAGNOSIS — Z853 Personal history of malignant neoplasm of breast: Secondary | ICD-10-CM | POA: Diagnosis not present

## 2020-11-30 DIAGNOSIS — R5381 Other malaise: Secondary | ICD-10-CM | POA: Diagnosis not present

## 2020-11-30 DIAGNOSIS — G9341 Metabolic encephalopathy: Secondary | ICD-10-CM | POA: Diagnosis present

## 2020-11-30 DIAGNOSIS — R531 Weakness: Secondary | ICD-10-CM | POA: Diagnosis not present

## 2020-11-30 DIAGNOSIS — F0150 Vascular dementia without behavioral disturbance: Secondary | ICD-10-CM | POA: Diagnosis present

## 2020-11-30 DIAGNOSIS — D649 Anemia, unspecified: Secondary | ICD-10-CM | POA: Diagnosis not present

## 2020-11-30 DIAGNOSIS — R2689 Other abnormalities of gait and mobility: Secondary | ICD-10-CM | POA: Diagnosis not present

## 2020-11-30 DIAGNOSIS — Z9181 History of falling: Secondary | ICD-10-CM | POA: Diagnosis not present

## 2020-11-30 DIAGNOSIS — I499 Cardiac arrhythmia, unspecified: Secondary | ICD-10-CM | POA: Diagnosis not present

## 2020-11-30 DIAGNOSIS — I480 Paroxysmal atrial fibrillation: Secondary | ICD-10-CM | POA: Diagnosis not present

## 2020-11-30 DIAGNOSIS — Z20822 Contact with and (suspected) exposure to covid-19: Secondary | ICD-10-CM | POA: Diagnosis present

## 2020-11-30 DIAGNOSIS — Z66 Do not resuscitate: Secondary | ICD-10-CM | POA: Diagnosis present

## 2020-11-30 DIAGNOSIS — R29818 Other symptoms and signs involving the nervous system: Secondary | ICD-10-CM | POA: Diagnosis not present

## 2020-11-30 DIAGNOSIS — Z515 Encounter for palliative care: Secondary | ICD-10-CM | POA: Diagnosis not present

## 2020-11-30 DIAGNOSIS — G9340 Encephalopathy, unspecified: Secondary | ICD-10-CM | POA: Diagnosis not present

## 2020-11-30 DIAGNOSIS — R52 Pain, unspecified: Secondary | ICD-10-CM | POA: Diagnosis not present

## 2020-11-30 DIAGNOSIS — Z7189 Other specified counseling: Secondary | ICD-10-CM

## 2020-11-30 DIAGNOSIS — I69828 Other speech and language deficits following other cerebrovascular disease: Secondary | ICD-10-CM | POA: Diagnosis not present

## 2020-11-30 DIAGNOSIS — E785 Hyperlipidemia, unspecified: Secondary | ICD-10-CM

## 2020-11-30 DIAGNOSIS — Z743 Need for continuous supervision: Secondary | ICD-10-CM | POA: Diagnosis not present

## 2020-11-30 DIAGNOSIS — E039 Hypothyroidism, unspecified: Secondary | ICD-10-CM | POA: Diagnosis present

## 2020-11-30 DIAGNOSIS — Z885 Allergy status to narcotic agent status: Secondary | ICD-10-CM | POA: Diagnosis not present

## 2020-11-30 DIAGNOSIS — K921 Melena: Secondary | ICD-10-CM | POA: Diagnosis not present

## 2020-11-30 DIAGNOSIS — R6889 Other general symptoms and signs: Secondary | ICD-10-CM | POA: Diagnosis not present

## 2020-11-30 DIAGNOSIS — R29710 NIHSS score 10: Secondary | ICD-10-CM | POA: Diagnosis present

## 2020-11-30 DIAGNOSIS — R4182 Altered mental status, unspecified: Secondary | ICD-10-CM | POA: Diagnosis not present

## 2020-11-30 DIAGNOSIS — Z7401 Bed confinement status: Secondary | ICD-10-CM | POA: Diagnosis not present

## 2020-11-30 DIAGNOSIS — I639 Cerebral infarction, unspecified: Secondary | ICD-10-CM | POA: Diagnosis not present

## 2020-11-30 DIAGNOSIS — R5383 Other fatigue: Secondary | ICD-10-CM | POA: Diagnosis not present

## 2020-11-30 DIAGNOSIS — R58 Hemorrhage, not elsewhere classified: Secondary | ICD-10-CM | POA: Diagnosis not present

## 2020-11-30 DIAGNOSIS — R413 Other amnesia: Secondary | ICD-10-CM

## 2020-11-30 DIAGNOSIS — R1312 Dysphagia, oropharyngeal phase: Secondary | ICD-10-CM | POA: Diagnosis not present

## 2020-11-30 DIAGNOSIS — R414 Neurologic neglect syndrome: Secondary | ICD-10-CM | POA: Diagnosis present

## 2020-11-30 DIAGNOSIS — Z9011 Acquired absence of right breast and nipple: Secondary | ICD-10-CM | POA: Diagnosis not present

## 2020-11-30 DIAGNOSIS — R41841 Cognitive communication deficit: Secondary | ICD-10-CM | POA: Diagnosis not present

## 2020-11-30 DIAGNOSIS — I63212 Cerebral infarction due to unspecified occlusion or stenosis of left vertebral arteries: Secondary | ICD-10-CM | POA: Diagnosis not present

## 2020-11-30 DIAGNOSIS — I10 Essential (primary) hypertension: Secondary | ICD-10-CM | POA: Diagnosis present

## 2020-11-30 DIAGNOSIS — Z951 Presence of aortocoronary bypass graft: Secondary | ICD-10-CM | POA: Diagnosis not present

## 2020-11-30 DIAGNOSIS — R2681 Unsteadiness on feet: Secondary | ICD-10-CM | POA: Diagnosis not present

## 2020-11-30 DIAGNOSIS — Z8719 Personal history of other diseases of the digestive system: Secondary | ICD-10-CM | POA: Diagnosis not present

## 2020-11-30 DIAGNOSIS — I63233 Cerebral infarction due to unspecified occlusion or stenosis of bilateral carotid arteries: Secondary | ICD-10-CM | POA: Diagnosis not present

## 2020-11-30 DIAGNOSIS — R410 Disorientation, unspecified: Secondary | ICD-10-CM | POA: Diagnosis not present

## 2020-11-30 DIAGNOSIS — I2510 Atherosclerotic heart disease of native coronary artery without angina pectoris: Secondary | ICD-10-CM | POA: Diagnosis not present

## 2020-11-30 DIAGNOSIS — H9190 Unspecified hearing loss, unspecified ear: Secondary | ICD-10-CM | POA: Diagnosis present

## 2020-11-30 DIAGNOSIS — E559 Vitamin D deficiency, unspecified: Secondary | ICD-10-CM | POA: Diagnosis present

## 2020-11-30 DIAGNOSIS — M170 Bilateral primary osteoarthritis of knee: Secondary | ICD-10-CM | POA: Diagnosis not present

## 2020-11-30 DIAGNOSIS — H510 Palsy (spasm) of conjugate gaze: Secondary | ICD-10-CM | POA: Diagnosis present

## 2020-11-30 DIAGNOSIS — Z888 Allergy status to other drugs, medicaments and biological substances status: Secondary | ICD-10-CM | POA: Diagnosis not present

## 2020-11-30 LAB — SARS CORONAVIRUS 2 (TAT 6-24 HRS): SARS Coronavirus 2: NEGATIVE

## 2020-11-30 MED ORDER — HYDROCORTISONE ACETATE 25 MG RE SUPP: 25.0000 mg | Freq: Two times a day (BID) | RECTAL | 1 refills | Status: DC | PRN

## 2020-11-30 MED ORDER — PANTOPRAZOLE SODIUM 40 MG PO TBEC: 40.0000 mg | DELAYED_RELEASE_TABLET | Freq: Every day | ORAL | 3 refills | Status: DC

## 2020-11-30 MED ORDER — PRAVASTATIN SODIUM 20 MG PO TABS: 20.0000 mg | ORAL_TABLET | Freq: Every day | ORAL | Status: AC

## 2020-11-30 NOTE — TOC Transition Note (Signed)
Transition of Care [facility]) - CM/SW Discharge Note   Patient Details  Name: Natalie Bullock MRN: [id number] Date of Birth: 03/17/1925  [facility] CM/SW Contact:  Vinie Sill, LCSW Phone Number: 11/30/2020, 11:44 AM   Clinical Narrative:     Patient will Discharge to: Post Discharge Date: 11/30/2020 Family Notified: Milon Dikes  Transport By: Corey Harold  Per MD patient is ready for discharge. RN, patient, and facility notified of discharge. Discharge Summary sent to facility. RN given number for [phone]. Ambulance transport requested for patient.   Clinical Social Worker signing off.  Thurmond Butts, MSW, LCSW Clinical Social Worker     Final next level of care: Skilled Nursing Facility Barriers to Discharge: Barriers Resolved   Patient Goals and CMS Choice        Discharge Placement              Patient chooses bed at: [facility] Patient to be transferred to facility by: [facility] Name of family member notified: Nephew Patient and family notified of of transfer: 11/30/20  Discharge Plan and Services In-house Referral: Clinical Social Work                                   Social Determinants of Health (SDOH) Interventions     Readmission Risk Interventions No flowsheet data found.

## 2020-11-30 NOTE — Care Management Important Message (Signed)
Important Message  Patient Details  Name: Natalie Bullock MRN: [id number] Date of Birth: 1925/02/19   Medicare Important Message Given:  Yes     Orbie Pyo 11/30/2020, 3:17 PM

## 2020-11-30 NOTE — Progress Notes (Signed)
Pt picked up by [facility].  Nephew aware.

## 2020-11-30 NOTE — Progress Notes (Signed)
Daily Progress Note   Patient Name: Natalie Bullock       Date: 11/30/2020 DOB: 1925/03/03  Age: 87 y.o. MRN#: [id number] Attending Physician: Hosie Poisson, MD Primary Care Physician: Seward Carol, MD Admit Date: 11/27/2020 Length of Stay: 3 days  Reason for Consultation/Follow-up: Establishing goals of care  HPI/Patient Profile:  87 y.o. female  with past medical history of HTN, CAD s/p CABG in 2002, hypothyroidism, bilateral knee OA and memory loss since last hospitalization who was admitted from the SNF ([facility]) on 11/27/2020 with rectal bleeding.    Significant decline in hemoglobin from 13.4-9.7.  Has been seen by GI and after discussion with her healthcare power of attorney/nephew [name], they have elected conservative treatment with no colonoscopy.  Possible tagged RBC scan if she bleeds again. Bleeding seems to have resolved, hgb stable. Planned d/c to SNF ([facility]) today.  Current Medications: Scheduled Meds:   levothyroxine  50 mcg Oral QAC breakfast   pantoprazole  40 mg Intravenous Q12H    Continuous Infusions:   PRN Meds:   Subjective:   Subjective: Chart Reviewed. Updates received. Patient Assessed. Created space and opportunity for patient  and family to explore thoughts and feelings regarding current medical situation.  Today's Discussion: Today I met with the patient's nephew/healthcare power of attorney Linna Caprice, and her friend/neighbor Bernadine Minor.  After that time to visit with the patient we met in the conference room.  We completed a life review of the patient.  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  She does not have many hobbies, although she does cook meals.  They describe her as "feisty" and states that she often bounces back.  They are hopeful that this will be the case at this time.  We discussed her significant mental  status changes which appear to be persistent today.  However, it is only been 3 weeks since her fall and 2 days since her hospital admission.  They are hopeful that she will eventually be able to regain some improved mental status.  They are agreeable with plan discharge for today back to the skilled nursing facility to focus on rehab and see if she can improve her function after her fall.  They are allowing time for mental status to return to baseline, although they are realistic that this may not occur.  Review of Systems  Respiratory:  Negative for chest tightness and shortness of breath.   Cardiovascular:  Negative for chest pain.  Gastrointestinal:  Positive for diarrhea. Negative for abdominal pain, nausea and vomiting.   Objective:   Vital Signs: BP 140/68 (BP Location: Left Arm)   Pulse 72   Temp 98.2 F (36.8 C) (Oral)   Resp 20   Ht '5\' 3"'  (1.6 m)   Wt 55.1 kg   SpO2 100%   BMI 21.52 kg/m  SpO2: SpO2: 100 % O2 Device: O2 Device: Room Air O2 Flow Rate: O2 Flow Rate (L/min): 0 L/min  Physical Exam: Physical Exam Constitutional:      General: She is not in acute distress.    Appearance: Normal appearance. She is not toxic-appearing.  HENT:     Head: Normocephalic and atraumatic.  Pulmonary:     Effort: Pulmonary effort is normal. No respiratory distress.  Abdominal:     General: Abdomen is flat. There is no distension.  Musculoskeletal:  General: Swelling (RLE, near where her bybass graft was taken years ago) present.  Skin:    General: Skin is warm and dry.  Neurological:     Mental Status: She is alert.     Comments: Pleasantly confused, oriented to person and place    SpO2: SpO2: 100 % O2 Device:SpO2: 100 % O2 Flow Rate: .O2 Flow Rate (L/min): 0 L/min  IO: Intake/output summary:  Intake/Output Summary (Last 24 hours) at 11/30/2020 1111 Last data filed at 11/30/2020 0900 Gross per 24 hour  Intake 613.74 ml  Output --  Net 613.74 ml    LBM: Last BM  Date: 11/29/20 Baseline Weight: Weight: 63.5 kg Most recent weight: Weight: 55.1 kg   Palliative Assessment/Data: 50%   Assessment & Plan:   Impression: Patient with persistent mental status changes that have been ongoing since her fall 3 weeks ago.  Continues today, some confusion.  States that at the nursing home they put her "in the basement" and could not identify her.  Her family has witnessed her confusion as well.  They feel this is all sequela of her recent fall.  Plans are to discharge back to the skilled nursing facility for continued rehab, possible transition to long-term care depending on how she progresses.  They are hopeful for some recovery, although they are prepared for if she does continue to have confusion and dementia.  It appears her GI bleed/acute issue is stabilized at this time.  SUMMARY OF RECOMMENDATIONS   Continue skilled nursing facility rehab Can transition to long-term care, depending on how she progresses in rehab Will refer for outpatient palliative care, family prefers [facility] Continue to treat the treatable Care guidance per MOST form completed/uploaded to the system  Code Status: DNR  Goals of Care/Recommendations: DNR Tube feed for defined trial (if needed) Antibiotics if indicated Fluids if indicated  Symptom Management: Per attending, no palliative symptom management needs identified today  Prognosis: Unable to determine  Discharge Planning: [place] for rehab with Palliative care service follow-up  Discussed with:  Dr. Karleen Hampshire, Thurmond Butts (TOC), Ciro Backer, RN  Thank you for allowing [place] to participate in the care of Courtney Paris PMT will continue to support holistically.  Time Total: 105 min  Visit consisted of counseling and education dealing with the complex and emotionally intense issues of symptom management and palliative care in the setting of serious and potentially life-threatening illness. Greater  than 50%  of this time was spent counseling and coordinating care related to the above assessment and plan.  Walden Field, NP Palliative Medicine Team  Team Phone # [phone] (Nights/Weekends)  06/06/7260, 03:55 AM   Vinie Sill, NP Palliative Medicine Team Pager [phone] (Please see [email or web address] for schedule) Team Phone [phone]    Greater than 50%  of this time was spent counseling and coordinating care related to the above assessment and plan

## 2020-11-30 NOTE — Discharge Summary (Addendum)
Physician Discharge Summary  Natalie Bullock IDP:[id number] DOB: 02/07/1925 DOA: 11/27/2020  PCP: Seward Carol, MD  Admit date: 11/27/2020 Discharge date: 11/30/2020  Admitted From: snf Disposition:  SNF  Recommendations for Outpatient Follow-up:  Follow up with PCP in 1-2 weeks Please obtain BMP/CBC in one week Please follow up with gastroenterology as needed.  We have stopped triamterene hydrochlorothiazide and amlodipine as you presented with mild AKI and dehydration and your bp parameters were borderline low. Please restart them as per your PCP .  Follow up with palliative care as outpatient.   Discharge Condition:stable.  CODE STATUS:DNR Diet recommendation: Heart Healthy   Brief/Interim Summary:  Natalie Bullock is a 86 y.o. female with medical history significant of HTN, CAD s/p CABG in 2002, hypothyroidism, bilateral knee OA and memory loss since last hospitalization who was seen for rectal bleeding that started yesterday. The SNF indicated she had some blood in her stool , was sent to the hospital for further evaluation. GI consulted, recommended conservative management. Discharge Diagnoses:  Principal Problem:   GI bleed Active Problems:   Hx of CABG   Hyperlipidemia   Hypothyroidism   HTN (hypertension)   GI bleed: - drop in hemoglobin from 13 to 9.7, Hemoglobin stabilized around 9.  - GI consulted,recommended conservative management. Transfuse to keep hemoglobin greater than 8. - gently hydrated, started on clears and advance as tolerated.  - discharge on oral PPI daily and anusol BID PRN.      History of CABG Continue with the Lipitor.       Hypertension BP parameters have improved. Restart lasix and metoprolol but hold amlodipine and triamterene hydrochlor thiazide for now.  Restart at a later time as per PCP/ cardiology.      Hypothyroidism Continue with Synthroid.     Hyperkalemia Resolved.   Mild AKI; Possibly from GI bleed and dehydration.  Hydrated.  Creatinine back to baseline.  Discharge Instructions  Discharge Instructions     Diet - low sodium heart healthy   Complete by: As directed    Increase activity slowly   Complete by: As directed       Allergies as of 11/30/2020       Reactions   Valium Shortness Of Breath, Other (See Comments)   "ALLERGIC," per [facility]   Codeine Other (See Comments)   "ALLERGIC," per [facility]   Crestor [rosuvastatin Calcium] Other (See Comments)   "ALLERGIC," per [facility]   Doxycycline Other (See Comments)   "ALLERGIC," per [facility]   Ezetimibe-simvastatin Other (See Comments)   Vytorin- "ALLERGIC," per [facility]   Lipitor [atorvastatin Calcium] Other (See Comments)   "ALLERGIC," per [facility]   Sulfa Drugs Cross Reactors Other (See Comments)   "ALLERGIC," per [facility]   Tequin Other (See Comments)   "ALLERGIC," per [facility]   Zocor [simvastatin] Other (See Comments)   "ALLERGIC," per [facility]        Medication List     STOP taking these medications    amLODipine 5 MG tablet Commonly known as: NORVASC   triamterene-hydrochlorothiazide 37.5-25 MG tablet Commonly known as: MAXZIDE-25       TAKE these medications    acetaminophen 325 MG tablet Commonly known as: TYLENOL Take 650 mg by mouth 3 (three) times daily.   C 500 500 MG Chew Generic drug: Ascorbic Acid Chew 500 mg by mouth daily.   diclofenac Sodium 1 % Gel Commonly known as: VOLTAREN Apply 2-4 g topically 4 (four) times daily as needed (for bilateral knee pain).  furosemide 20 MG tablet Commonly known as: LASIX Take 20 mg by mouth daily.   hydrocortisone 25 MG suppository Commonly known as: ANUSOL-HC Place 1 suppository (25 mg total) rectally 2 (two) times daily as needed for hemorrhoids or anal itching.   levothyroxine 50 MCG tablet Commonly known as: SYNTHROID Take 50 mcg by mouth daily before breakfast.   magnesium hydroxide 400 MG/5ML suspension Commonly known as: MILK OF MAGNESIA Take 30 mLs by mouth daily as needed for mild  constipation.   metoprolol tartrate 25 MG tablet Commonly known as: LOPRESSOR Take 1 tablet (25 mg total) by mouth 2 (two) times daily.   pantoprazole 40 MG tablet Commonly known as: Protonix Take 1 tablet (40 mg total) by mouth daily.   pravastatin 20 MG tablet Commonly known as: PRAVACHOL Take 1 tablet (20 mg total) by mouth at bedtime. What changed: See the new instructions.   RA SALINE ENEMA RE Place 1 enema rectally daily as needed (for constipation not relieved by Dulcolax suppository- call MD if no relief from the enema).   senna-docusate 8.6-50 MG tablet Commonly known as: Senokot-S Take 1 tablet by mouth at bedtime as needed for mild constipation.   SYSTANE OP Place 2 drops into both eyes every morning.        Follow-up Information     Seward Carol, MD. Schedule an appointment as soon as possible for a visit in 1 week(s).   Specialty: Internal Medicine Contact information: [address] [address] [phone]         Lelon Perla, MD .   Specialty: Cardiology Contact information: [address] [place] [address] [phone]                Allergies  Allergen Reactions   Valium Shortness Of Breath and Other (See Comments)    "ALLERGIC," per [facility]   Codeine Other (See Comments)    "ALLERGIC," per [facility]   Crestor [Rosuvastatin Calcium] Other (See Comments)    "ALLERGIC," per [facility]   Doxycycline Other (See Comments)    "ALLERGIC," per [facility]   Ezetimibe-Simvastatin Other (See Comments)    Vytorin- "ALLERGIC," per [facility]   Lipitor [Atorvastatin Calcium] Other (See Comments)    "ALLERGIC," per [facility]   Sulfa Drugs Cross Reactors Other (See Comments)    "ALLERGIC," per [facility]   Tequin Other (See Comments)    "ALLERGIC," per [facility]   Zocor [Simvastatin] Other (See Comments)    "ALLERGIC," per [facility]    Consultations: Gastroenterology.    Procedures/Studies: CT Head Wo Contrast  Result Date:  11/03/2020 CLINICAL DATA:  Found down with head and face trauma. Altered mental status. EXAM: CT HEAD WITHOUT CONTRAST CT MAXILLOFACIAL WITHOUT CONTRAST CT CERVICAL SPINE WITHOUT CONTRAST TECHNIQUE: Multidetector CT imaging of the head, cervical spine, and maxillofacial structures were performed using the standard protocol without intravenous contrast. Multiplanar CT image reconstructions of the cervical spine and maxillofacial structures were also generated. COMPARISON:  CT head dated 03/26/2013, CT cervical spine dated 12/05/2012. FINDINGS: CT HEAD FINDINGS Brain: No evidence of acute infarction, hemorrhage, hydrocephalus, extra-axial collection or mass lesion/mass effect. There is moderate cerebral volume loss with associated ex vacuo dilatation. Periventricular white matter hypoattenuation likely represents chronic small vessel ischemic disease. Vascular: There are vascular calcifications in the carotid siphons. Skull: Normal. Negative for fracture or focal lesion. Other: There is scalp soft tissue swelling at the vertex. CT MAXILLOFACIAL FINDINGS Osseous: No fracture or mandibular dislocation. No destructive process. Orbits:  Negative. No traumatic or inflammatory finding. Sinuses: There is right ethmoid sinus disease. Soft tissues: There is left periorbital and left forehead soft tissue swelling. CT CERVICAL SPINE FINDINGS Alignment: Normal. Skull base and vertebrae: No acute fracture. No primary bone lesion or focal pathologic process. Soft tissues and spinal canal: No prevertebral fluid or swelling. No visible canal hematoma. Disc levels: Moderate to severe multilevel degenerative disc and joint disease. Upper chest: Negative. Other: None. IMPRESSION: 1. No acute intracranial process. 2. No acute osseous injury in the cervical spine. 3. No acute facial bone fracture. Electronically Signed   By: Zerita Boers M.D.   On: 11/03/2020 12:09   CT Cervical Spine Wo Contrast  Result Date: 11/03/2020 CLINICAL DATA:   Found down with head and face trauma. Altered mental status. EXAM: CT HEAD WITHOUT CONTRAST CT MAXILLOFACIAL WITHOUT CONTRAST CT CERVICAL SPINE WITHOUT CONTRAST TECHNIQUE: Multidetector CT imaging of the head, cervical spine, and maxillofacial structures were performed using the standard protocol without intravenous contrast. Multiplanar CT image reconstructions of the cervical spine and maxillofacial structures were also generated. COMPARISON:  CT head dated 03/26/2013, CT cervical spine dated 12/05/2012. FINDINGS: CT HEAD FINDINGS Brain: No evidence of acute infarction, hemorrhage, hydrocephalus, extra-axial collection or mass lesion/mass effect. There is moderate cerebral volume loss with associated ex vacuo dilatation. Periventricular white matter hypoattenuation likely represents chronic small vessel ischemic disease. Vascular: There are vascular calcifications in the carotid siphons. Skull: Normal. Negative for fracture or focal lesion. Other: There is scalp soft tissue swelling at the vertex. CT MAXILLOFACIAL FINDINGS Osseous: No fracture or mandibular dislocation. No destructive process. Orbits: Negative. No traumatic or inflammatory finding. Sinuses: There is right ethmoid sinus disease. Soft tissues: There is left periorbital and left forehead soft tissue swelling. CT CERVICAL SPINE FINDINGS Alignment: Normal. Skull base and vertebrae: No acute fracture. No primary bone lesion or focal pathologic process. Soft tissues and spinal canal: No prevertebral fluid or swelling. No visible canal hematoma. Disc levels: Moderate to severe multilevel degenerative disc and joint disease. Upper chest: Negative. Other: None. IMPRESSION: 1. No acute intracranial process. 2. No acute osseous injury in the cervical spine. 3. No acute facial bone fracture. Electronically Signed   By: Zerita Boers M.D.   On: 11/03/2020 12:09   DG Pelvis Portable  Result Date: 11/03/2020 CLINICAL DATA:  Fall, leg pain EXAM: PORTABLE PELVIS  1-2 VIEWS COMPARISON:  Radiograph 12/05/2012 FINDINGS: Hips are located. No evidence of femoral neck fracture on AP view. LEFT femur internally rotated. Findings similar to comparison exam. No pelvic fracture or sacral fracture. Calcified leiomyoma noted. IMPRESSION: No evidence of pelvic fracture or hip fracture. Electronically Signed   By: Suzy Bouchard M.D.   On: 11/03/2020 11:03   DG Chest Portable 1 View  Result Date: 11/03/2020 CLINICAL DATA:  Leg pain post fall, history coronary artery disease post MI and CABG, hypertension, ischemic heart disease EXAM: PORTABLE CHEST 1 VIEW COMPARISON:  Portable exam 1042 hours compared to 10/20/2015 FINDINGS: Normal heart size post CABG. Mediastinal contours and pulmonary vascularity normal. Atherosclerotic calcification aorta. Lungs clear. No pulmonary infiltrate, pleural effusion, or pneumothorax. Diffuse osseous demineralization. IMPRESSION: Post CABG. No acute abnormalities. Aortic Atherosclerosis (ICD10-I70.0). Electronically Signed   By: Lavonia Dana M.D.   On: 11/03/2020 11:01   DG Knee Left Port  Result Date: 11/03/2020 CLINICAL DATA:  Leg pain post unwitnessed fall EXAM: PORTABLE LEFT KNEE - 1-2 VIEW COMPARISON:  Portable exam 1045 hours compared to 01/21/2018 FINDINGS: Osseous demineralization. Tricompartmental osteoarthritic changes  with joint space narrowing and spur formation greatest at medial compartment. No acute fracture, dislocation, or bone destruction. No joint effusion. Extensive atherosclerotic calcifications of superficial femoral and popliteal arteries. IMPRESSION: Osseous demineralization with tricompartmental osteoarthritic changes. No acute abnormalities. Electronically Signed   By: Lavonia Dana M.D.   On: 11/03/2020 11:02   DG Knee Right Port  Result Date: 11/03/2020 CLINICAL DATA:  Pain following fall EXAM: PORTABLE RIGHT KNEE - 1-2 VIEW COMPARISON:  None. FINDINGS: Frontal and lateral views were obtained. No fracture or dislocation.  No joint effusion. There is moderate narrowing in the patellofemoral joint region with milder narrowing medially. There is spurring in all compartments. There is chondrocalcinosis. There are multiple foci of arterial vascular calcification. Postoperative clips noted posterior and medial to the proximal tibia. IMPRESSION: No evident fracture, dislocation, or joint effusion. There is osteoarthritic change, most notably in the patellofemoral joint. Chondrocalcinosis is present which may be seen with osteoarthritis or calcium pyrophosphate deposition disease. There are foci of aortic atherosclerosis. Postoperative change noted posteriorly and medially. Electronically Signed   By: Lowella Grip III M.D.   On: 11/03/2020 11:02   ECHOCARDIOGRAM COMPLETE  Result Date: 11/05/2020    ECHOCARDIOGRAM REPORT   Patient Name:   RISA AUMAN Date of Exam: 11/05/2020 Medical Rec #:  [id number]    Height:       63.0 in Accession #:    [id number]   Weight:       142.4 lb Date of Birth:  1925-02-18   BSA:          1.674 m Patient Age:    7 years     BP:           169/73 mmHg Patient Gender: F            HR:           74 bpm. Exam Location:  Inpatient Procedure: Cardiac Doppler and Color Doppler Indications:    Syncope  History:        Patient has prior history of Echocardiogram examinations, most                 recent 08/16/2017. CAD, Prior CABG, Arrythmias:Atrial                 Fibrillation; Risk Factors:Hypertension.  Sonographer:    Clayton Lefort RDCS (AE) Referring Phys: [id number] [place]  1. Left ventricular ejection fraction, by estimation, is 55 to 60%. The left ventricle has normal function. The left ventricle has no regional wall motion abnormalities. There is mild left ventricular hypertrophy. Left ventricular diastolic parameters are indeterminate.  2. Right ventricular systolic function is normal. The right ventricular size is normal.  3. Left atrial size was moderately dilated.  4. The mitral valve is  grossly normal. Trivial mitral valve regurgitation. No evidence of mitral stenosis.  5. The aortic valve is tricuspid. There is mild calcification of the aortic valve. There is mild thickening of the aortic valve. Aortic valve regurgitation is not visualized. Mild aortic valve sclerosis is present, with no evidence of aortic valve stenosis.  6. The inferior vena cava is normal in size with greater than 50% respiratory variability, suggesting right atrial pressure of 3 mmHg. Comparison(s): No significant change from prior study. FINDINGS  Left Ventricle: Left ventricular ejection fraction, by estimation, is 55 to 60%. The left ventricle has normal function. The left ventricle has no regional wall motion abnormalities. The left ventricular internal cavity size was  normal in size. There is  mild left ventricular hypertrophy. Left ventricular diastolic parameters are indeterminate. Right Ventricle: The right ventricular size is normal. Right vetricular wall thickness was not well visualized. Right ventricular systolic function is normal. Left Atrium: Left atrial size was moderately dilated. Right Atrium: Right atrial size was not well visualized. Pericardium: There is no evidence of pericardial effusion. Mitral Valve: The mitral valve is grossly normal. There is mild thickening of the mitral valve leaflet(s). There is mild calcification of the mitral valve leaflet(s). Trivial mitral valve regurgitation. No evidence of mitral valve stenosis. Tricuspid Valve: The tricuspid valve is normal in structure. Tricuspid valve regurgitation is trivial. No evidence of tricuspid stenosis. Aortic Valve: The aortic valve is tricuspid. There is mild calcification of the aortic valve. There is mild thickening of the aortic valve. Aortic valve regurgitation is not visualized. Mild aortic valve sclerosis is present, with no evidence of aortic valve stenosis. Aortic valve mean gradient measures 5.0 mmHg. Aortic valve peak gradient measures  8.5 mmHg. Aortic valve area, by VTI measures 1.48 cm. Pulmonic Valve: The pulmonic valve was not well visualized. Pulmonic valve regurgitation is not visualized. No evidence of pulmonic stenosis. Aorta: The aortic root, ascending aorta and aortic arch are all structurally normal, with no evidence of dilitation or obstruction. Venous: The inferior vena cava is normal in size with greater than 50% respiratory variability, suggesting right atrial pressure of 3 mmHg. IAS/Shunts: The atrial septum is grossly normal.  LEFT VENTRICLE PLAX 2D LVIDd:         4.50 cm  Diastology LVIDs:         2.60 cm  LV e' medial:    6.64 cm/s LV PW:         1.10 cm  LV E/e' medial:  10.7 LV IVS:        1.00 cm  LV e' lateral:   8.81 cm/s LVOT diam:     1.80 cm  LV E/e' lateral: 8.1 LV SV:         47 LV SV Index:   28 LVOT Area:     2.54 cm  RIGHT VENTRICLE             IVC RV Basal diam:  3.30 cm     IVC diam: 1.10 cm RV S prime:     10.80 cm/s TAPSE (M-mode): 1.4 cm LEFT ATRIUM             Index       RIGHT ATRIUM           Index LA diam:        3.80 cm 2.27 cm/m  RA Area:     26.10 cm LA Vol (A2C):   63.5 ml 37.94 ml/m RA Volume:   85.70 ml  51.20 ml/m LA Vol (A4C):   59.1 ml 35.31 ml/m LA Biplane Vol: 62.4 ml 37.28 ml/m  AORTIC VALVE AV Area (Vmax):    1.27 cm AV Area (Vmean):   1.13 cm AV Area (VTI):     1.48 cm AV Vmax:           146.00 cm/s AV Vmean:          104.000 cm/s AV VTI:            0.319 m AV Peak Grad:      8.5 mmHg AV Mean Grad:      5.0 mmHg LVOT Vmax:         73.10 cm/s LVOT  Vmean:        46.300 cm/s LVOT VTI:          0.185 m LVOT/AV VTI ratio: 0.58  AORTA Ao Root diam: 3.40 cm Ao Asc diam:  3.40 cm MITRAL VALVE MV Area (PHT): 3.12 cm    SHUNTS MV Decel Time: 243 msec    Systemic VTI:  0.18 m MV E velocity: 71.10 cm/s  Systemic Diam: 1.80 cm MV A velocity: 51.80 cm/s MV E/A ratio:  1.37 Buford Dresser MD Electronically signed by Buford Dresser MD Signature Date/Time: 11/05/2020/4:07:33 PM    Final     CT Maxillofacial Wo Contrast  Result Date: 11/03/2020 CLINICAL DATA:  Found down with head and face trauma. Altered mental status. EXAM: CT HEAD WITHOUT CONTRAST CT MAXILLOFACIAL WITHOUT CONTRAST CT CERVICAL SPINE WITHOUT CONTRAST TECHNIQUE: Multidetector CT imaging of the head, cervical spine, and maxillofacial structures were performed using the standard protocol without intravenous contrast. Multiplanar CT image reconstructions of the cervical spine and maxillofacial structures were also generated. COMPARISON:  CT head dated 03/26/2013, CT cervical spine dated 12/05/2012. FINDINGS: CT HEAD FINDINGS Brain: No evidence of acute infarction, hemorrhage, hydrocephalus, extra-axial collection or mass lesion/mass effect. There is moderate cerebral volume loss with associated ex vacuo dilatation. Periventricular white matter hypoattenuation likely represents chronic small vessel ischemic disease. Vascular: There are vascular calcifications in the carotid siphons. Skull: Normal. Negative for fracture or focal lesion. Other: There is scalp soft tissue swelling at the vertex. CT MAXILLOFACIAL FINDINGS Osseous: No fracture or mandibular dislocation. No destructive process. Orbits: Negative. No traumatic or inflammatory finding. Sinuses: There is right ethmoid sinus disease. Soft tissues: There is left periorbital and left forehead soft tissue swelling. CT CERVICAL SPINE FINDINGS Alignment: Normal. Skull base and vertebrae: No acute fracture. No primary bone lesion or focal pathologic process. Soft tissues and spinal canal: No prevertebral fluid or swelling. No visible canal hematoma. Disc levels: Moderate to severe multilevel degenerative disc and joint disease. Upper chest: Negative. Other: None. IMPRESSION: 1. No acute intracranial process. 2. No acute osseous injury in the cervical spine. 3. No acute facial bone fracture. Electronically Signed   By: Zerita Boers M.D.   On: 11/03/2020 12:09      Subjective: No  new complaints, cheerful.   Discharge Exam: Vitals:   11/30/20 0908 11/30/20 1211  BP: 140/68 129/65  Pulse: 72 74  Resp: 20 20  Temp: 98.2 F (36.8 C) 98.9 F (37.2 C)  SpO2: 100% 100%   Vitals:   11/29/20 2317 11/30/20 0335 11/30/20 0908 11/30/20 1211  BP: (!) 134/55 129/61 140/68 129/65  Pulse: 60 80 72 74  Resp: 16 20 20 20   Temp: 98 F (36.7 C) 98.2 F (36.8 C) 98.2 F (36.8 C) 98.9 F (37.2 C)  TempSrc: Oral Oral Oral Oral  SpO2: 100% 98% 100% 100%  Weight:      Height:        General: Pt is alert, awake, not in acute distress Cardiovascular: RRR, S1/S2 +, no rubs, no gallops Respiratory: CTA bilaterally, no wheezing, no rhonchi Abdominal: Soft, NT, ND, bowel sounds + Extremities: no edema, no cyanosis    The results of significant diagnostics from this hospitalization (including imaging, microbiology, ancillary and laboratory) are listed below for reference.     Microbiology: Recent Results (from the past 240 hour(s))  Resp Panel by RT-PCR (Flu A&B, Covid) Nasopharyngeal Swab     Status: None   Collection Time: 11/27/20  2:10 PM   Specimen: Nasopharyngeal  Swab; Nasopharyngeal(NP) swabs in vial transport medium  Result Value Ref Range Status   SARS Coronavirus 2 by RT PCR NEGATIVE NEGATIVE Final    Comment: (NOTE) SARS-CoV-2 target nucleic acids are NOT DETECTED.  The SARS-CoV-2 RNA is generally detectable in upper respiratory specimens during the acute phase of infection. The lowest concentration of SARS-CoV-2 viral copies this assay can detect is 138 copies/mL. A negative result does not preclude SARS-Cov-2 infection and should not be used as the sole basis for treatment or other patient management decisions. A negative result may occur with  improper specimen collection/handling, submission of specimen other than nasopharyngeal swab, presence of viral mutation(s) within the areas targeted by this assay, and inadequate number of viral copies(<138  copies/mL). A negative result must be combined with clinical observations, patient history, and epidemiological information. The expected result is Negative.  Fact Sheet for Patients:  EntrepreneurPulse.com.au  Fact Sheet for Healthcare Providers:  IncredibleEmployment.be  This test is no t yet approved or cleared by the [place] FDA and  has been authorized for detection and/or diagnosis of SARS-CoV-2 by FDA under an Emergency Use Authorization (EUA). This EUA will remain  in effect (meaning this test can be used) for the duration of the COVID-19 declaration under Section 564(b)(1) of the Act, 21 U.S.C.section 360bbb-3(b)(1), unless the authorization is terminated  or revoked sooner.       Influenza A by PCR NEGATIVE NEGATIVE Final   Influenza B by PCR NEGATIVE NEGATIVE Final    Comment: (NOTE) The Xpert Xpress SARS-CoV-2/FLU/RSV plus assay is intended as an aid in the diagnosis of influenza from Nasopharyngeal swab specimens and should not be used as a sole basis for treatment. Nasal washings and aspirates are unacceptable for Xpert Xpress SARS-CoV-2/FLU/RSV testing.  Fact Sheet for Patients: EntrepreneurPulse.com.au  Fact Sheet for Healthcare Providers: IncredibleEmployment.be  This test is not yet approved or cleared by the [place] FDA and has been authorized for detection and/or diagnosis of SARS-CoV-2 by FDA under an Emergency Use Authorization (EUA). This EUA will remain in effect (meaning this test can be used) for the duration of the COVID-19 declaration under Section 564(b)(1) of the Act, 21 U.S.C. section 360bbb-3(b)(1), unless the authorization is terminated or revoked.  Performed at [facility], [place] [address] [place], [place] [address]   SARS CORONAVIRUS 2 (TAT 6-24 HRS) Nasopharyngeal Nasopharyngeal Swab     Status: None   Collection Time: 11/30/20  2:10 AM    Specimen: Nasopharyngeal Swab  Result Value Ref Range Status   SARS Coronavirus 2 NEGATIVE NEGATIVE Final    Comment: (NOTE) SARS-CoV-2 target nucleic acids are NOT DETECTED.  The SARS-CoV-2 RNA is generally detectable in upper and lower respiratory specimens during the acute phase of infection. Negative results do not preclude SARS-CoV-2 infection, do not rule out co-infections with other pathogens, and should not be used as the sole basis for treatment or other patient management decisions. Negative results must be combined with clinical observations, patient history, and epidemiological information. The expected result is Negative.  Fact Sheet for Patients: SugarRoll.be  Fact Sheet for Healthcare Providers: https://www.woods-mathews.com/  This test is not yet approved or cleared by the [place] FDA and  has been authorized for detection and/or diagnosis of SARS-CoV-2 by FDA under an Emergency Use Authorization (EUA). This EUA will remain  in effect (meaning this test can be used) for the duration of the COVID-19 declaration under Se ction 564(b)(1) of the Act, 21 U.S.C. section 360bbb-3(b)(1), unless  the authorization is terminated or revoked sooner.  Performed at [facility], [place] [address] [place], [place] [address]      Labs: BNP (last 3 results) No results for input(s): BNP in the last 8760 hours. Basic Metabolic Panel: Recent Labs  Lab 11/27/20 1401 11/27/20 2132 11/28/20 0110 11/29/20 0045  NA 132* 134* 135 133*  K 5.2* 3.6 3.7 3.5  CL 94* 101 100 101  CO2 22 21* 24 25  GLUCOSE 83 77 83 93  BUN 72* 70* 65* 42*  CREATININE 1.25* 1.22* 1.17* 0.99  CALCIUM 9.9 9.2 9.5 8.9   Liver Function Tests: Recent Labs  Lab 11/27/20 1401 11/28/20 0110  AST 32 13*  ALT 17 11  ALKPHOS 48 43  BILITOT 1.9* 0.6  PROT 7.3 6.6  ALBUMIN 3.5 3.0*   Recent Labs  Lab 11/27/20 1401  LIPASE 50   No results for  input(s): AMMONIA in the last 168 hours. CBC: Recent Labs  Lab 11/27/20 1401 11/27/20 2132 11/28/20 0110 11/28/20 0510 11/28/20 1004 11/28/20 1351 11/28/20 1804 11/28/20 2137 11/29/20 0045  WBC 6.7  --  5.9  --   --   --   --   --  5.3  NEUTROABS 5.1  --   --   --   --   --   --   --  3.7  HGB 13.4   < > 11.2*   < > 9.9* 10.7* 10.0* 9.7* 9.7*  HCT 41.8   < > 34.5*   < > 30.5* 33.0* 30.6* 29.7* 29.2*  MCV 86.4  --  83.7  --   --   --   --   --  83.4  PLT 261  --  286  --   --   --   --   --  247   < > = values in this interval not displayed.   Cardiac Enzymes: No results for input(s): CKTOTAL, CKMB, CKMBINDEX, TROPONINI in the last 168 hours. BNP: Invalid input(s): POCBNP CBG: No results for input(s): GLUCAP in the last 168 hours. D-Dimer No results for input(s): DDIMER in the last 72 hours. Hgb A1c No results for input(s): HGBA1C in the last 72 hours. Lipid Profile No results for input(s): CHOL, HDL, LDLCALC, TRIG, CHOLHDL, LDLDIRECT in the last 72 hours. Thyroid function studies No results for input(s): TSH, T4TOTAL, T3FREE, THYROIDAB in the last 72 hours.  Invalid input(s): FREET3 Anemia work up No results for input(s): VITAMINB12, FOLATE, FERRITIN, TIBC, IRON, RETICCTPCT in the last 72 hours. Urinalysis    Component Value Date/Time   COLORURINE YELLOW 11/03/2020 0954   APPEARANCEUR HAZY (A) 11/03/2020 0954   LABSPEC 1.024 11/03/2020 0954   PHURINE 6.0 11/03/2020 0954   GLUCOSEU NEGATIVE 11/03/2020 0954   HGBUR NEGATIVE 11/03/2020 0954   BILIRUBINUR NEGATIVE 11/03/2020 0954   KETONESUR NEGATIVE 11/03/2020 0954   PROTEINUR NEGATIVE 11/03/2020 0954   UROBILINOGEN 1.0 03/26/2013 0624   NITRITE NEGATIVE 11/03/2020 0954   LEUKOCYTESUR TRACE (A) 11/03/2020 0954   Sepsis Labs Invalid input(s): PROCALCITONIN,  WBC,  LACTICIDVEN Microbiology Recent Results (from the past 240 hour(s))  Resp Panel by RT-PCR (Flu A&B, Covid) Nasopharyngeal Swab     Status: None    Collection Time: 11/27/20  2:10 PM   Specimen: Nasopharyngeal Swab; Nasopharyngeal(NP) swabs in vial transport medium  Result Value Ref Range Status   SARS Coronavirus 2 by RT PCR NEGATIVE NEGATIVE Final    Comment: (NOTE) SARS-CoV-2 target nucleic acids are  NOT DETECTED.  The SARS-CoV-2 RNA is generally detectable in upper respiratory specimens during the acute phase of infection. The lowest concentration of SARS-CoV-2 viral copies this assay can detect is 138 copies/mL. A negative result does not preclude SARS-Cov-2 infection and should not be used as the sole basis for treatment or other patient management decisions. A negative result may occur with  improper specimen collection/handling, submission of specimen other than nasopharyngeal swab, presence of viral mutation(s) within the areas targeted by this assay, and inadequate number of viral copies(<138 copies/mL). A negative result must be combined with clinical observations, patient history, and epidemiological information. The expected result is Negative.  Fact Sheet for Patients:  EntrepreneurPulse.com.au  Fact Sheet for Healthcare Providers:  IncredibleEmployment.be  This test is no t yet approved or cleared by the [place] FDA and  has been authorized for detection and/or diagnosis of SARS-CoV-2 by FDA under an Emergency Use Authorization (EUA). This EUA will remain  in effect (meaning this test can be used) for the duration of the COVID-19 declaration under Section 564(b)(1) of the Act, 21 U.S.C.section 360bbb-3(b)(1), unless the authorization is terminated  or revoked sooner.       Influenza A by PCR NEGATIVE NEGATIVE Final   Influenza B by PCR NEGATIVE NEGATIVE Final    Comment: (NOTE) The Xpert Xpress SARS-CoV-2/FLU/RSV plus assay is intended as an aid in the diagnosis of influenza from Nasopharyngeal swab specimens and should not be used as a sole basis for treatment.  Nasal washings and aspirates are unacceptable for Xpert Xpress SARS-CoV-2/FLU/RSV testing.  Fact Sheet for Patients: EntrepreneurPulse.com.au  Fact Sheet for Healthcare Providers: IncredibleEmployment.be  This test is not yet approved or cleared by the [place] FDA and has been authorized for detection and/or diagnosis of SARS-CoV-2 by FDA under an Emergency Use Authorization (EUA). This EUA will remain in effect (meaning this test can be used) for the duration of the COVID-19 declaration under Section 564(b)(1) of the Act, 21 U.S.C. section 360bbb-3(b)(1), unless the authorization is terminated or revoked.  Performed at [facility], [place] [address], [place], [place] [address]   SARS CORONAVIRUS 2 (TAT 6-24 HRS) Nasopharyngeal Nasopharyngeal Swab     Status: None   Collection Time: 11/30/20  2:10 AM   Specimen: Nasopharyngeal Swab  Result Value Ref Range Status   SARS Coronavirus 2 NEGATIVE NEGATIVE Final    Comment: (NOTE) SARS-CoV-2 target nucleic acids are NOT DETECTED.  The SARS-CoV-2 RNA is generally detectable in upper and lower respiratory specimens during the acute phase of infection. Negative results do not preclude SARS-CoV-2 infection, do not rule out co-infections with other pathogens, and should not be used as the sole basis for treatment or other patient management decisions. Negative results must be combined with clinical observations, patient history, and epidemiological information. The expected result is Negative.  Fact Sheet for Patients: SugarRoll.be  Fact Sheet for Healthcare Providers: https://www.woods-mathews.com/  This test is not yet approved or cleared by the [place] FDA and  has been authorized for detection and/or diagnosis of SARS-CoV-2 by FDA under an Emergency Use Authorization (EUA). This EUA will remain  in effect (meaning this test can be used)  for the duration of the COVID-19 declaration under Se ction 564(b)(1) of the Act, 21 U.S.C. section 360bbb-3(b)(1), unless the authorization is terminated or revoked sooner.  Performed at [facility], [place] [address] [place], [place] [address]      Time coordinating discharge: 34 minutes.   SIGNED:  Hosie Poisson, MD  [facility] 11/30/2020, 12:37 PM

## 2020-11-30 NOTE — Progress Notes (Signed)
Attempted to call report.  On hold for > 44mins.

## 2020-11-30 NOTE — Progress Notes (Addendum)
Manufacturing engineer [facility])  Hospital Liaison RN note         Notified by [facility] manager of patient/family request for [facility] Palliative services at [facility] discharge.              [place] team will follow up with patient after discharge.         Please call with any hospice or palliative related questions.         Thank you for the opportunity to participate in this patient's care.     Domenic Moras, BSN, RN [facility] Liaison (listed on [place] under [facility])    [phone] (24h on call)

## 2020-12-04 DIAGNOSIS — R5381 Other malaise: Secondary | ICD-10-CM | POA: Diagnosis not present

## 2020-12-04 DIAGNOSIS — E039 Hypothyroidism, unspecified: Secondary | ICD-10-CM | POA: Diagnosis not present

## 2020-12-04 DIAGNOSIS — I10 Essential (primary) hypertension: Secondary | ICD-10-CM | POA: Diagnosis not present

## 2020-12-04 DIAGNOSIS — Z8719 Personal history of other diseases of the digestive system: Secondary | ICD-10-CM | POA: Diagnosis not present

## 2020-12-05 ENCOUNTER — Inpatient Hospital Stay (HOSPITAL_COMMUNITY)
Admission: EM | Admit: 2020-12-05 | Discharge: 2020-12-07 | DRG: 071 | Disposition: A | Discharge status: Skilled Nursing Facility | Payer: Medicare Other | Attending: Internal Medicine | Admitting: Internal Medicine

## 2020-12-05 ENCOUNTER — Emergency Department (HOSPITAL_COMMUNITY): Payer: Medicare Other

## 2020-12-05 ENCOUNTER — Encounter (HOSPITAL_COMMUNITY): Payer: Self-pay | Admitting: Internal Medicine

## 2020-12-05 ENCOUNTER — Inpatient Hospital Stay (HOSPITAL_COMMUNITY): Payer: Medicare Other

## 2020-12-05 DIAGNOSIS — I480 Paroxysmal atrial fibrillation: Secondary | ICD-10-CM | POA: Diagnosis present

## 2020-12-05 DIAGNOSIS — Z881 Allergy status to other antibiotic agents status: Secondary | ICD-10-CM

## 2020-12-05 DIAGNOSIS — Z66 Do not resuscitate: Secondary | ICD-10-CM | POA: Diagnosis present

## 2020-12-05 DIAGNOSIS — Z9181 History of falling: Secondary | ICD-10-CM

## 2020-12-05 DIAGNOSIS — R6889 Other general symptoms and signs: Secondary | ICD-10-CM | POA: Diagnosis not present

## 2020-12-05 DIAGNOSIS — H9190 Unspecified hearing loss, unspecified ear: Secondary | ICD-10-CM | POA: Diagnosis present

## 2020-12-05 DIAGNOSIS — Z79890 Hormone replacement therapy (postmenopausal): Secondary | ICD-10-CM

## 2020-12-05 DIAGNOSIS — F0150 Vascular dementia without behavioral disturbance: Secondary | ICD-10-CM | POA: Diagnosis present

## 2020-12-05 DIAGNOSIS — Z820 Family history of epilepsy and other diseases of the nervous system: Secondary | ICD-10-CM

## 2020-12-05 DIAGNOSIS — Z853 Personal history of malignant neoplasm of breast: Secondary | ICD-10-CM | POA: Diagnosis not present

## 2020-12-05 DIAGNOSIS — I63212 Cerebral infarction due to unspecified occlusion or stenosis of left vertebral arteries: Secondary | ICD-10-CM | POA: Diagnosis not present

## 2020-12-05 DIAGNOSIS — I499 Cardiac arrhythmia, unspecified: Secondary | ICD-10-CM | POA: Diagnosis not present

## 2020-12-05 DIAGNOSIS — E039 Hypothyroidism, unspecified: Secondary | ICD-10-CM | POA: Diagnosis present

## 2020-12-05 DIAGNOSIS — Z20822 Contact with and (suspected) exposure to covid-19: Secondary | ICD-10-CM | POA: Diagnosis present

## 2020-12-05 DIAGNOSIS — R29818 Other symptoms and signs involving the nervous system: Secondary | ICD-10-CM | POA: Diagnosis not present

## 2020-12-05 DIAGNOSIS — Z743 Need for continuous supervision: Secondary | ICD-10-CM | POA: Diagnosis not present

## 2020-12-05 DIAGNOSIS — I10 Essential (primary) hypertension: Secondary | ICD-10-CM | POA: Diagnosis present

## 2020-12-05 DIAGNOSIS — G9341 Metabolic encephalopathy: Principal | ICD-10-CM | POA: Diagnosis present

## 2020-12-05 DIAGNOSIS — Z87891 Personal history of nicotine dependence: Secondary | ICD-10-CM

## 2020-12-05 DIAGNOSIS — Z885 Allergy status to narcotic agent status: Secondary | ICD-10-CM | POA: Diagnosis not present

## 2020-12-05 DIAGNOSIS — I69328 Other speech and language deficits following cerebral infarction: Secondary | ICD-10-CM | POA: Diagnosis not present

## 2020-12-05 DIAGNOSIS — H510 Palsy (spasm) of conjugate gaze: Secondary | ICD-10-CM | POA: Diagnosis present

## 2020-12-05 DIAGNOSIS — R531 Weakness: Secondary | ICD-10-CM | POA: Diagnosis not present

## 2020-12-05 DIAGNOSIS — G459 Transient cerebral ischemic attack, unspecified: Secondary | ICD-10-CM | POA: Diagnosis not present

## 2020-12-05 DIAGNOSIS — Z9011 Acquired absence of right breast and nipple: Secondary | ICD-10-CM

## 2020-12-05 DIAGNOSIS — R414 Neurologic neglect syndrome: Secondary | ICD-10-CM | POA: Diagnosis present

## 2020-12-05 DIAGNOSIS — I2510 Atherosclerotic heart disease of native coronary artery without angina pectoris: Secondary | ICD-10-CM | POA: Diagnosis present

## 2020-12-05 DIAGNOSIS — Z7189 Other specified counseling: Secondary | ICD-10-CM

## 2020-12-05 DIAGNOSIS — Z888 Allergy status to other drugs, medicaments and biological substances status: Secondary | ICD-10-CM | POA: Diagnosis not present

## 2020-12-05 DIAGNOSIS — I639 Cerebral infarction, unspecified: Secondary | ICD-10-CM

## 2020-12-05 DIAGNOSIS — R29710 NIHSS score 10: Secondary | ICD-10-CM | POA: Diagnosis present

## 2020-12-05 DIAGNOSIS — E559 Vitamin D deficiency, unspecified: Secondary | ICD-10-CM | POA: Diagnosis present

## 2020-12-05 DIAGNOSIS — Z79899 Other long term (current) drug therapy: Secondary | ICD-10-CM

## 2020-12-05 DIAGNOSIS — R410 Disorientation, unspecified: Secondary | ICD-10-CM | POA: Diagnosis not present

## 2020-12-05 DIAGNOSIS — E785 Hyperlipidemia, unspecified: Secondary | ICD-10-CM | POA: Diagnosis present

## 2020-12-05 DIAGNOSIS — Z8719 Personal history of other diseases of the digestive system: Secondary | ICD-10-CM | POA: Diagnosis not present

## 2020-12-05 DIAGNOSIS — G9340 Encephalopathy, unspecified: Secondary | ICD-10-CM | POA: Diagnosis not present

## 2020-12-05 DIAGNOSIS — Z951 Presence of aortocoronary bypass graft: Secondary | ICD-10-CM | POA: Diagnosis not present

## 2020-12-05 DIAGNOSIS — I63233 Cerebral infarction due to unspecified occlusion or stenosis of bilateral carotid arteries: Secondary | ICD-10-CM | POA: Diagnosis not present

## 2020-12-05 DIAGNOSIS — Z515 Encounter for palliative care: Secondary | ICD-10-CM

## 2020-12-05 DIAGNOSIS — R404 Transient alteration of awareness: Secondary | ICD-10-CM | POA: Diagnosis not present

## 2020-12-05 DIAGNOSIS — R58 Hemorrhage, not elsewhere classified: Secondary | ICD-10-CM | POA: Diagnosis not present

## 2020-12-05 DIAGNOSIS — R4182 Altered mental status, unspecified: Secondary | ICD-10-CM | POA: Diagnosis not present

## 2020-12-05 DIAGNOSIS — I252 Old myocardial infarction: Secondary | ICD-10-CM

## 2020-12-05 HISTORY — DX: Other speech and language deficits following cerebral infarction: I69.328

## 2020-12-05 HISTORY — DX: Cerebral infarction, unspecified: I63.9

## 2020-12-05 LAB — COMPREHENSIVE METABOLIC PANEL
ALT: 11 U/L (ref 0–44)
AST: 15 U/L (ref 15–41)
Albumin: 2.7 g/dL — ABNORMAL LOW (ref 3.5–5.0)
Alkaline Phosphatase: 48 U/L (ref 38–126)
Anion gap: 9 (ref 5–15)
BUN: 20 mg/dL (ref 8–23)
CO2: 21 mmol/L — ABNORMAL LOW (ref 22–32)
Calcium: 8.9 mg/dL (ref 8.9–10.3)
Chloride: 106 mmol/L (ref 98–111)
Creatinine, Ser: 0.94 mg/dL (ref 0.44–1.00)
GFR, Estimated: 57 mL/min — ABNORMAL LOW (ref >60)
Glucose, Bld: 133 mg/dL — ABNORMAL HIGH (ref 70–99)
Potassium: 3.7 mmol/L (ref 3.5–5.1)
Sodium: 136 mmol/L (ref 135–145)
Total Bilirubin: 0.7 mg/dL (ref 0.3–1.2)
Total Protein: 6 g/dL — ABNORMAL LOW (ref 6.5–8.1)

## 2020-12-05 LAB — CBC
HCT: 30.6 % — ABNORMAL LOW (ref 36.0–46.0)
Hemoglobin: 9.7 g/dL — ABNORMAL LOW (ref 12.0–15.0)
MCH: 27.6 pg (ref 26.0–34.0)
MCHC: 31.7 g/dL (ref 30.0–36.0)
MCV: 87.2 fL (ref 80.0–100.0)
Platelets: 274 10*3/uL (ref 150–400)
RBC: 3.51 MIL/uL — ABNORMAL LOW (ref 3.87–5.11)
RDW: 15.8 % — ABNORMAL HIGH (ref 11.5–15.5)
WBC: 7.4 10*3/uL (ref 4.0–10.5)
nRBC: 0 % (ref 0.0–0.2)

## 2020-12-05 LAB — DIFFERENTIAL
Abs Immature Granulocytes: 0.04 10*3/uL (ref 0.00–0.07)
Basophils Absolute: 0 10*3/uL (ref 0.0–0.1)
Basophils Relative: 1 %
Eosinophils Absolute: 0.1 10*3/uL (ref 0.0–0.5)
Eosinophils Relative: 1 %
Immature Granulocytes: 1 %
Lymphocytes Relative: 11 %
Lymphs Abs: 0.8 10*3/uL (ref 0.7–4.0)
Monocytes Absolute: 0.5 10*3/uL (ref 0.1–1.0)
Monocytes Relative: 7 %
Neutro Abs: 5.9 10*3/uL (ref 1.7–7.7)
Neutrophils Relative %: 79 %

## 2020-12-05 LAB — I-STAT CHEM 8, ED
BUN: 24 mg/dL — ABNORMAL HIGH (ref 8–23)
Calcium, Ion: 1.15 mmol/L (ref 1.15–1.40)
Chloride: 107 mmol/L (ref 98–111)
Creatinine, Ser: 0.9 mg/dL (ref 0.44–1.00)
Glucose, Bld: 130 mg/dL — ABNORMAL HIGH (ref 70–99)
HCT: 29 % — ABNORMAL LOW (ref 36.0–46.0)
Hemoglobin: 9.9 g/dL — ABNORMAL LOW (ref 12.0–15.0)
Potassium: 3.9 mmol/L (ref 3.5–5.1)
Sodium: 137 mmol/L (ref 135–145)
TCO2: 22 mmol/L (ref 22–32)

## 2020-12-05 LAB — PROTIME-INR
INR: 1 (ref 0.8–1.2)
Prothrombin Time: 13.4 seconds (ref 11.4–15.2)

## 2020-12-05 LAB — CBG MONITORING, ED: Glucose-Capillary: 108 mg/dL — ABNORMAL HIGH (ref 70–99)

## 2020-12-05 LAB — VITAMIN B12: Vitamin B-12: 305 pg/mL (ref 180–914)

## 2020-12-05 LAB — RESP PANEL BY RT-PCR (FLU A&B, COVID) ARPGX2
Influenza A by PCR: NEGATIVE
Influenza B by PCR: NEGATIVE
SARS Coronavirus 2 by RT PCR: NEGATIVE

## 2020-12-05 LAB — APTT: aPTT: 33 seconds (ref 24–36)

## 2020-12-05 LAB — TSH: TSH: 0.724 u[IU]/mL (ref 0.350–4.500)

## 2020-12-05 MED ORDER — ONDANSETRON 4 MG PO TBDP: 4.0000 mg | ORAL_TABLET | Freq: Four times a day (QID) | ORAL | Status: DC | PRN

## 2020-12-05 MED ORDER — POLYVINYL ALCOHOL 1.4 % OP SOLN
1.0000 [drp] | Freq: Four times a day (QID) | OPHTHALMIC | Status: DC | PRN
  Filled 2020-12-05: qty 15

## 2020-12-05 MED ORDER — GLYCOPYRROLATE 1 MG PO TABS
1.0000 mg | ORAL_TABLET | ORAL | Status: DC | PRN
  Filled 2020-12-05: qty 1

## 2020-12-05 MED ORDER — ACETAMINOPHEN 325 MG PO TABS
650.0000 mg | ORAL_TABLET | Freq: Four times a day (QID) | ORAL | Status: DC | PRN
  Administered 2020-12-05 – 2020-12-06 (×2): 650 mg via ORAL
  Filled 2020-12-05 (×2): qty 2

## 2020-12-05 MED ORDER — DIPHENHYDRAMINE HCL 50 MG/ML IJ SOLN: 12.5000 mg | INTRAMUSCULAR | Status: DC | PRN

## 2020-12-05 MED ORDER — HALOPERIDOL 0.5 MG PO TABS
0.5000 mg | ORAL_TABLET | ORAL | Status: DC | PRN
  Filled 2020-12-05: qty 1

## 2020-12-05 MED ORDER — ACETAMINOPHEN 650 MG RE SUPP: 650.0000 mg | Freq: Four times a day (QID) | RECTAL | Status: DC | PRN

## 2020-12-05 MED ORDER — HALOPERIDOL LACTATE 5 MG/ML IJ SOLN
0.5000 mg | INTRAMUSCULAR | Status: DC | PRN
  Administered 2020-12-06: 0.5 mg via INTRAVENOUS
  Filled 2020-12-05: qty 1

## 2020-12-05 MED ORDER — SODIUM CHLORIDE 0.9% FLUSH: 3.0000 mL | Freq: Once | INTRAVENOUS | Status: DC

## 2020-12-05 MED ORDER — ONDANSETRON HCL 4 MG/2ML IJ SOLN: 4.0000 mg | Freq: Four times a day (QID) | INTRAMUSCULAR | Status: DC | PRN

## 2020-12-05 MED ORDER — MAGNESIUM HYDROXIDE 400 MG/5ML PO SUSP
30.0000 mL | Freq: Every day | ORAL | Status: DC | PRN
  Filled 2020-12-05: qty 30

## 2020-12-05 MED ORDER — LEVOTHYROXINE SODIUM 50 MCG PO TABS
50.0000 ug | ORAL_TABLET | Freq: Every day | ORAL | Status: DC
  Administered 2020-12-06 – 2020-12-07 (×2): 50 ug via ORAL
  Filled 2020-12-05 (×2): qty 1

## 2020-12-05 MED ORDER — BIOTENE DRY MOUTH MT LIQD
15.0000 mL | OROMUCOSAL | Status: DC | PRN
  Filled 2020-12-05: qty 15

## 2020-12-05 MED ORDER — PANTOPRAZOLE SODIUM 40 MG PO TBEC
40.0000 mg | DELAYED_RELEASE_TABLET | Freq: Every day | ORAL | Status: DC
  Administered 2020-12-06 – 2020-12-07 (×2): 40 mg via ORAL
  Filled 2020-12-05 (×2): qty 1

## 2020-12-05 MED ORDER — HYDROCORTISONE ACETATE 25 MG RE SUPP
25.0000 mg | Freq: Two times a day (BID) | RECTAL | Status: DC | PRN
  Filled 2020-12-05: qty 1

## 2020-12-05 MED ORDER — GLYCOPYRROLATE 0.2 MG/ML IJ SOLN: 0.2000 mg | INTRAMUSCULAR | Status: DC | PRN

## 2020-12-05 MED ORDER — MORPHINE SULFATE (CONCENTRATE) 10 MG/0.5ML PO SOLN
5.0000 mg | ORAL | Status: DC | PRN
  Filled 2020-12-05: qty 0.5

## 2020-12-05 MED ORDER — IOHEXOL 350 MG/ML SOLN
75.0000 mL | Freq: Once | INTRAVENOUS | Status: AC | PRN
  Administered 2020-12-05: 75 mL via INTRAVENOUS

## 2020-12-05 MED ORDER — PRAVASTATIN SODIUM 10 MG PO TABS
20.0000 mg | ORAL_TABLET | Freq: Every day | ORAL | Status: DC
  Administered 2020-12-05 – 2020-12-06 (×2): 20 mg via ORAL
  Filled 2020-12-05 (×2): qty 2

## 2020-12-05 MED ORDER — POLYETHYL GLYCOL-PROPYL GLYCOL 0.4-0.3 % OP GEL: Freq: Every morning | OPHTHALMIC | Status: DC

## 2020-12-05 MED ORDER — HALOPERIDOL LACTATE 2 MG/ML PO CONC
0.5000 mg | ORAL | Status: DC | PRN
  Filled 2020-12-05: qty 0.3

## 2020-12-05 MED ORDER — SENNOSIDES-DOCUSATE SODIUM 8.6-50 MG PO TABS: 1.0000 | ORAL_TABLET | Freq: Every evening | ORAL | Status: DC | PRN

## 2020-12-05 MED ORDER — MORPHINE SULFATE (CONCENTRATE) 10 MG/0.5ML PO SOLN
5.0000 mg | ORAL | Status: DC | PRN
  Administered 2020-12-06: 5 mg via ORAL

## 2020-12-05 NOTE — Procedures (Signed)
Patient Name: AKSHITA ITALIANO  MRN: [id number]  Epilepsy Attending: Lora Havens  Referring Physician/Provider: Anibal Henderson, NP Date: 12/05/2020 Duration: 23.49 mins  Patient history: 87 y.o. female who presented to the ED for evaluation of acute onset confusion, left-sided neglect, and right gaze palsy. EEG to evaluate for seizure.   Level of alertness: Awake  AEDs during EEG study: None  Technical aspects: This EEG study was done with scalp electrodes positioned according to the 10-20 International system of electrode placement. Electrical activity was acquired at a sampling rate of 500Hz  and reviewed with a high frequency filter of 70Hz  and a low frequency filter of 1Hz . EEG data were recorded continuously and digitally stored.   Description: No posterior dominant rhythm was seen. EEG showed continuous generalized polymorphic mixed frequencies with predominantly 5 to 6 Hz theta as well as intermittent 2-3hz  delta slowing admixed with 9-10hz  generalized alpha activity. Hyperventilation and photic stimulation were not performed.     ABNORMALITY - Continuous slow, generalized  IMPRESSION: This study is suggestive of moderate diffuse encephalopathy, nonspecific etiology. No seizures or epileptiform discharges were seen throughout the recording.  Palmina Clodfelter [name]

## 2020-12-05 NOTE — ED Triage Notes (Signed)
Pt from [facility] post GI bleed.  Physical Therapy stated AMS with acute onset L sided weakness.

## 2020-12-05 NOTE — ED Provider Notes (Signed)
[facility] EMERGENCY DEPARTMENT Provider Note   CSN: [id number] Arrival date & time: 12/05/20  1325  An emergency department physician performed an initial assessment on this suspected stroke patient at 1325.  History No chief complaint on file.   Natalie Bullock is a 87 y.o. female.  The history is provided by the patient and medical records.  Natalie Bullock is a 87 y.o. female who presents to the Emergency Department complaining of code stroke. Level V caveat due to confusion. History is provided by EMS. She presents the emergency department by EMS from [facility] for evaluation of confusion and left-sided neglect. She was last known well at 1145 this morning. When they went to check on her later she was lethargic and not following commands. She appeared to be having a right-sided gaze preference. She was recently admitted for G.I. bleed.    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   Vitamin D deficiency     Patient Active Problem List   Diagnosis Date Noted   Acute CVA (cerebrovascular accident) ([place]) 12/05/2020   Memory loss    Goals of care, counseling/discussion    Palliative care by specialist    GI bleed 11/27/2020   Encephalopathy 11/03/2020   Fall    Orthostasis    Mitral regurgitation 03/07/2014   Near syncope 03/26/2013   Syncope 05/19/2012   Delirium 09/12/2011   HTN (hypertension) 08/17/2011   HX: breast cancer 08/14/2011   Hypothyroidism 08/14/2011   Hx of CABG 11/19/2010   Hyperlipidemia 11/19/2010    Past Surgical History:  Procedure Laterality Date   BREAST SURGERY      left breast cyst surgery    CARDIAC CATHETERIZATION  01/20/2001   NORMAL. EF 60-70%   CARDIOVASCULAR STRESS TEST  Aug 2010   Normal; EF 74%   CORONARY ARTERY BYPASS GRAFT  12/2000   LEFT INTERNAL MAMMARY TO THE LAD, SAPHENOUS VEIN GRAFT TO THE FIRST DIAGONAL, SAPHENOUS VEIN GRAFT TO THE RAMUS INTERMEDIATE, AND A SEQUENTIAL SAPHENOUS VEIN GRAFT TO THE POSTERIOR DESCENDING AND POSTERIOR LATERAL BRANCHES.   MASS EXCISION  04/08/2011   Procedure: EXCISION MASS;  Surgeon: Wynonia Sours, MD;  Location: [facility];  Service: Orthopedics;  Laterality: Left;  excision mass left palm   MASTECTOMY  1975   RIGHT SIDE   TUBAL LIGATION     1960's     OB History   No obstetric history on file.     Family History  Problem Relation Age of Onset   Alzheimer's disease Mother     Social History   Tobacco Use   Smoking status: Former    Packs/day: 1.00    Years: 4.00    Pack years: 4.00    Types: Cigarettes    Quit date: 06/02/1969    Years since quitting: 51.5   Smokeless tobacco: Never  Substance Use Topics   Alcohol use: No   Drug use: No    Home Medications Prior  to Admission medications   Medication Sig Start Date End Date Taking? Authorizing Provider  acetaminophen (TYLENOL) 325 MG tablet Take 650 mg by mouth 3 (three) times daily.    [provider]  Ascorbic Acid (C 500) 500 MG CHEW Chew 500 mg by mouth daily.    [provider]  diclofenac Sodium (VOLTAREN) 1 % GEL Apply 2-4 g topically 4 (four) times daily as needed (for bilateral knee pain).    [provider]  furosemide (LASIX) 20 MG tablet Take 20 mg by mouth daily.    [provider]  hydrocortisone (ANUSOL-HC) 25 MG suppository Place 1 suppository (25 mg total) rectally 2 (two) times daily as needed for hemorrhoids or anal itching. 11/30/20 12/30/20  Hosie Poisson, MD  levothyroxine (SYNTHROID, LEVOTHROID) 50 MCG tablet Take 50 mcg by mouth daily before breakfast.    [provider]  magnesium hydroxide (MILK OF MAGNESIA) 400 MG/5ML suspension Take 30 mLs by mouth daily as needed for mild constipation.    [provider]  metoprolol tartrate (LOPRESSOR) 25 MG tablet Take 1 tablet (25 mg total) by mouth 2 (two) times daily. 11/08/20   Amin, Jeanella Flattery, MD  pantoprazole (PROTONIX) 40 MG tablet Take 1 tablet (40 mg total) by mouth daily. 11/30/20   Hosie Poisson, MD  Polyethyl Glycol-Propyl Glycol (SYSTANE OP) Place 2 drops into both eyes every morning.    [provider]  pravastatin (PRAVACHOL) 20 MG tablet Take 1 tablet (20 mg total) by mouth at bedtime. 11/30/20   Hosie Poisson, MD  senna-docusate (SENOKOT-S) 8.6-50 MG tablet Take 1 tablet by mouth at bedtime as needed for mild constipation. 11/08/20   Amin, Jeanella Flattery, MD  Sodium Phosphates (RA SALINE ENEMA RE) Place 1 enema rectally daily as needed (for constipation not relieved by Dulcolax suppository- call MD if no relief from the enema).    [provider]    Allergies    Valium, Codeine, Crestor [rosuvastatin calcium], Doxycycline, Ezetimibe-simvastatin, Lipitor [atorvastatin calcium], Sulfa drugs cross reactors, Tequin, and Zocor [simvastatin]  Review of Systems   Review of Systems  All other systems reviewed and are negative.  Physical Exam Updated Vital Signs BP (!) 139/51 (BP Location: Right Arm)   Pulse 69   Temp 98.5 F (36.9 C) (Oral)   Resp 20   SpO2 100%   Physical Exam Vitals and nursing note reviewed.  Constitutional:      Appearance: She is well-developed.  HENT:     Head: Normocephalic and atraumatic.  Cardiovascular:     Rate and Rhythm: Normal rate and regular rhythm.     Heart sounds: No murmur heard. Pulmonary:     Effort: Pulmonary effort is normal. No respiratory distress.     Breath sounds: Normal breath sounds.  Abdominal:     Palpations: Abdomen is soft.     Tenderness: There is no abdominal tenderness. There is no guarding or rebound.   Musculoskeletal:        General: No tenderness.  Skin:    General: Skin is warm and dry.  Neurological:     Mental Status: She is alert.     Comments: Confused. Right-sided gaze preference. Mild left-sided facial droop. Moves bilateral upper extremities symmetrically but does have left-sided neglect.  Psychiatric:        Behavior: Behavior normal.    ED Results / Procedures / Treatments   Labs (all labs ordered are listed, but only abnormal results are displayed) Labs Reviewed  CBC -  Abnormal; Notable for the following components:      Result Value   RBC 3.51 (*)    Hemoglobin 9.7 (*)    HCT 30.6 (*)    RDW 15.8 (*)    All other components within normal limits  COMPREHENSIVE METABOLIC PANEL - Abnormal; Notable for the following components:   CO2 21 (*)    Glucose, Bld 133 (*)    Total Protein 6.0 (*)    Albumin 2.7 (*)    GFR, Estimated 57 (*)    All other components within normal limits  I-STAT CHEM 8, ED - Abnormal; Notable for the following components:   BUN 24 (*)    Glucose, Bld 130 (*)    Hemoglobin 9.9 (*)    HCT 29.0 (*)    All other components within normal limits  CBG MONITORING, ED - Abnormal; Notable for the following components:   Glucose-Capillary 108 (*)    All other components within normal limits  RESP PANEL BY RT-PCR (FLU A&B, COVID) ARPGX2  PROTIME-INR  APTT  DIFFERENTIAL  URINALYSIS, ROUTINE W REFLEX MICROSCOPIC    EKG None  Radiology CT HEAD CODE STROKE WO CONTRAST  Result Date: 12/05/2020 CLINICAL DATA:  Code stroke. EXAM: CT HEAD WITHOUT CONTRAST TECHNIQUE: Contiguous axial images were obtained from the base of the skull through the vertex without intravenous contrast. COMPARISON:  11/03/2020 FINDINGS: Brain: There is no acute intracranial hemorrhage, mass effect, or edema. No new loss of gray-white differentiation. Ventricles and sulci are stable in size and configuration. Stable findings of probable chronic microvascular ischemic changes in  the cerebral white matter. No extra-axial collection. Vascular: No hyperdense vessel. Intracranial atherosclerotic calcifications at the skull base. Skull: Unremarkable. Sinuses/Orbits: No acute abnormality. Other: Mastoid air cells are clear. ASPECTS ([place] Stroke Program Early CT Score) - Ganglionic level infarction (caudate, lentiform nuclei, internal capsule, insula, M1-M3 cortex): 7 - Supraganglionic infarction (M4-M6 cortex): 3 Total score (0-10 with 10 being normal): 10 IMPRESSION: There is no acute intracranial hemorrhage or evidence of acute infarction. ASPECT score is 10. No change since prior study. These results were communicated to Dr. Leonie Man at 1:39 pm on 12/05/2020 by text page via the [facility] messaging system. Electronically Signed   By: Macy Mis M.D.   On: 12/05/2020 13:42   CT ANGIO HEAD NECK W WO CM (CODE STROKE)  Result Date: 12/05/2020 CLINICAL DATA:  Code stroke presentation. EXAM: CT ANGIOGRAPHY HEAD AND NECK TECHNIQUE: Multidetector CT imaging of the head and neck was performed using the standard protocol during bolus administration of intravenous contrast. Multiplanar CT image reconstructions and MIPs were obtained to evaluate the vascular anatomy. Carotid stenosis measurements (when applicable) are obtained utilizing NASCET criteria, using the distal internal carotid diameter as the denominator. CONTRAST:  9mL OMNIPAQUE IOHEXOL 350 MG/ML SOLN COMPARISON:  MRI study 08/16/2011.  Head CT earlier same day. FINDINGS: CTA NECK FINDINGS Aortic arch: Aortic atherosclerosis. Branching pattern is normal. Innominate artery origin is not included. Right carotid system: Common carotid artery widely patent to the bifurcation. Soft and calcified plaque at the carotid bifurcation but no stenosis. Cervical ICA is tortuous but widely patent to the skull base. Left carotid system: Common carotid artery widely patent to the bifurcation. Calcified plaque at the carotid bifurcation and ICA bulb but no  stenosis. Cervical ICA is tortuous but widely patent to the skull base. Vertebral arteries: Proximal subclavian arteries are widely patent. Both vertebral artery origins are widely patent. The left vertebral artery is dominant. Both vertebral  arteries appear normal through the cervical region to the foramen magnum. Skeleton: Ordinary degenerative cervical spondylosis. Other neck: No mass or lymphadenopathy. Upper chest: Normal Review of the MIP images confirms the above findings CTA HEAD FINDINGS Anterior circulation: Both internal carotid arteries are patent through the skull base and siphon regions. There is siphon atherosclerotic calcification but no stenosis greater than 30%. The anterior and middle cerebral vessels are patent without proximal stenosis, aneurysm or vascular malformation. No large or medium vessel occlusion is identified. Posterior circulation: Both vertebral arteries are patent through the foramen magnum to the basilar. No basilar stenosis. 30% stenosis of the left vertebral artery V4 segment. Posterior circulation branch vessels appear patent. Venous sinuses: Patent and normal. Anatomic variants: None significant. Review of the MIP images confirms the above findings IMPRESSION: No intracranial large or medium vessel occlusion. Atherosclerotic change at the carotid bifurcations and carotid siphon regions. No stenosis at the carotid bifurcation regions. No siphon stenosis greater than 30%. Findings discussed during interpretation at approximately 1355 hours with Dr. Leonie Man. Electronically Signed   By: Nelson Chimes M.D.   On: 12/05/2020 14:01    Procedures Procedures   Medications Ordered in ED Medications  sodium chloride flush (NS) 0.9 % injection 3 mL (has no administration in time range)  iohexol (OMNIPAQUE) 350 MG/ML injection 75 mL (75 mLs Intravenous Contrast Given 12/05/20 1348)    ED Course  I have reviewed the triage vital signs and the nursing notes.  Pertinent labs & imaging  results that were available during my care of the patient were reviewed by me and considered in my medical decision making (see chart for details).    MDM Rules/Calculators/A&P                         patient with recent admission for G.I. bleed here for evaluation of acute stroke symptoms. She was evaluated by neurology team on ED presentation. She was found to not be a TPA candidate due to a recent G.I. bleed. Medicine consulted for admission of for ongoing treatment.   Final Clinical Impression(s) / ED Diagnoses Final diagnoses:  Acute CVA (cerebrovascular accident) [facility])    Rx / [place] Orders ED Discharge Orders     None        Quintella Reichert, MD 12/05/20 1445

## 2020-12-05 NOTE — H&P (Signed)
History and Physical    Natalie Bullock QQI:[id number] DOB: [birth year] DOA: 12/05/2020  PCP: Seward Carol, MD Consultants:  Stanford Breed - cardiology Patient coming from:  [facility] SNF NOK: M[facility] D[facility] [place], [phone]  Chief Complaint: AMS  HPI: Natalie Bullock is a 87 y.o. female with medical history significant of remote breast cancer; CAD s/p CABG; HTN; HLD; dementia; and hypothyroidism presenting with stroke symptoms.  She was last admitted from 6/28-7/1 with GI bleeding, treated with conservative therapy.  Her nephew, who is her POA and who depends on an RN friend at [facility]) to help with decisions, provided the history.  He reports that she was doing well prior to last d/c and was ok but not as strong or engaged after d/c to SNF.  The facility called him this AM to report that she was unresponsive and also appeared to not be using her left arm/leg.  She has become more interactive while in the ER.  I discussed GOC with [name] and Alexander Bergeron and we are in agreement with DNR; no escalation of care; and no invasive/bothersome procedures.  She would not tolerate an MRI well and so this is declined.  PT/OT consults are requested, as she may return to baseline capacity to participate in rehab and then would return to SNF at [facility].  Palliative care consult also requested, as she may continue to decline and would then need to consider hospice.  1 month ago, she lived independently but she has declined significantly in the last month.    ED Course: Recently admitted with GI bleed.  Now with acute CVA.  Not a candidate for tPA or IR.  Goal is comfort measures.  Review of Systems: Unable to effectively perform  Ambulatory Status:  Ambulates with a walker  COVID Vaccine Status:  Complete  Past Medical History:  Diagnosis Date   Anemia    Arthritis    osteoarthritis   Breast cancer ([place]) 1975   Right sided mastectomy   Coronary artery disease    H/O hypokalemia    Hx of CABG     pt states she had 5 bypasses   Hyperlipidemia    Hypertension    Hypothyroidism    IHD (ischemic heart disease)    with CABG in August 2002   Myocardial infarction [facility])    Non Q-wave MI   Vitamin D deficiency     Past Surgical History:  Procedure Laterality Date   BREAST SURGERY     left breast cyst surgery    CARDIAC CATHETERIZATION  01/20/2001   NORMAL. EF 60-70%   CARDIOVASCULAR STRESS TEST  Aug 2010   Normal; EF 74%   CORONARY ARTERY BYPASS GRAFT  12/2000   LEFT INTERNAL MAMMARY TO THE LAD, SAPHENOUS VEIN GRAFT TO THE FIRST DIAGONAL, SAPHENOUS VEIN GRAFT TO THE RAMUS INTERMEDIATE, AND A SEQUENTIAL SAPHENOUS VEIN GRAFT TO THE POSTERIOR DESCENDING AND POSTERIOR LATERAL BRANCHES.   MASS EXCISION  04/08/2011   Procedure: EXCISION MASS;  Surgeon: Wynonia Sours, MD;  Location: [place];  Service: Orthopedics;  Laterality: Left;  excision mass left palm   MASTECTOMY  1975   RIGHT SIDE   TUBAL LIGATION     1960's    Social History   Socioeconomic History   Marital status: Widowed    Spouse name: Not on file   Number of children: Not on file   Years of education: Not on file   Highest education level: Not on file  Occupational History   Not on file  Tobacco Use   Smoking status: Former    Packs/day: 1.00    Years: 4.00    Pack years: 4.00    Types: Cigarettes    Quit date: 06/02/1969    Years since quitting: 51.5   Smokeless tobacco: Never  Substance and Sexual Activity   Alcohol use: No   Drug use: No   Sexual activity: Never  Other Topics Concern   Not on file  Social History Narrative   Not on file   Social Determinants of Health   Financial Resource Strain: Not on file  Food Insecurity: Not on file  Transportation Needs: Not on file  Physical Activity: Not on file  Stress: Not on file  Social Connections: Not on file  Intimate Partner Violence: Not on file    Allergies  Allergen Reactions   Valium Shortness Of Breath and Other (See  Comments)    "ALLERGIC," per [facility]   Codeine Other (See Comments)    "ALLERGIC," per [facility]   Crestor [Rosuvastatin Calcium] Other (See Comments)    "ALLERGIC," per [facility]   Doxycycline Other (See Comments)    "ALLERGIC," per [facility]   Ezetimibe-Simvastatin Other (See Comments)    Vytorin- "ALLERGIC," per [facility]   Lipitor [Atorvastatin Calcium] Other (See Comments)    "ALLERGIC," per [facility]   Sulfa Drugs Cross Reactors Other (See Comments)    "ALLERGIC," per [facility]   Tequin Other (See Comments)    "ALLERGIC," per [facility]   Zocor [Simvastatin] Other (See Comments)    "ALLERGIC," per [facility]    Family History  Problem Relation Age of Onset   Alzheimer's disease Mother     Prior to Admission medications   Medication Sig Start Date End Date Taking? Authorizing Provider  acetaminophen (TYLENOL) 325 MG tablet Take 650 mg by mouth 3 (three) times daily.    [provider]  Ascorbic Acid (C 500) 500 MG CHEW Chew 500 mg by mouth daily.    [provider]  diclofenac Sodium (VOLTAREN) 1 % GEL Apply 2-4 g topically 4 (four) times daily as needed (for bilateral knee pain).    [provider]  furosemide (LASIX) 20 MG tablet Take 20 mg by mouth daily.    [provider]  hydrocortisone (ANUSOL-HC) 25 MG suppository Place 1 suppository (25 mg total) rectally 2 (two) times daily as needed for hemorrhoids or anal itching. 11/30/20 12/30/20  Hosie Poisson, MD  levothyroxine (SYNTHROID, LEVOTHROID) 50 MCG tablet Take 50 mcg by mouth daily before breakfast.    [provider]  magnesium hydroxide (MILK OF MAGNESIA) 400 MG/5ML suspension Take 30 mLs by mouth daily as needed for mild constipation.    [provider]  metoprolol tartrate (LOPRESSOR) 25 MG tablet Take 1 tablet (25 mg total) by mouth 2 (two) times daily. 11/08/20   Amin, Jeanella Flattery, MD  pantoprazole (PROTONIX) 40 MG tablet Take 1 tablet (40 mg total) by mouth daily. 11/30/20   Hosie Poisson, MD  Polyethyl  Glycol-Propyl Glycol (SYSTANE OP) Place 2 drops into both eyes every morning.    [provider]  pravastatin (PRAVACHOL) 20 MG tablet Take 1 tablet (20 mg total) by mouth at bedtime. 11/30/20   Hosie Poisson, MD  senna-docusate (SENOKOT-S) 8.6-50 MG tablet Take 1 tablet by mouth at bedtime as needed for mild constipation. 11/08/20   Amin, Jeanella Flattery, MD  Sodium Phosphates (RA SALINE ENEMA RE) Place 1 enema rectally daily as needed (for constipation  not relieved by Dulcolax suppository- call MD if no relief from the enema).    [provider]    Physical Exam: Vitals:   12/05/20 1338 12/05/20 1345  BP: 123/65 (!) 139/51  Pulse: (!) 53 69  Resp:  20  Temp:  98.5 F (36.9 C)  TempSrc:  Oral  SpO2: 100%      General:  Appears calm and comfortable and is in NAD; EEG electrodes in place Eyes:   normal lids, iris, eyes mostly closed ENT:  grossly normal hearing, lips & tongue, mmm; edentulous Neck:  no LAD, masses or thyromegaly Cardiovascular:  RRR, no m/r/g. No LE edema.  Respiratory:   CTA bilaterally with no wheezes/rales/rhonchi.  Normal respiratory effort. Abdomen:  soft, NT, ND Skin:  no rash or induration seen on limited exam Musculoskeletal:  grossly normal tone BUE/BLE, no bony abnormality, L knee pain with leg movement (states chronic) Psychiatric:  pleasantly confused mood and affect, speech fluent and appropriate Neurologic:  CN 2-12 grossly intact, moves all extremities in coordinated fashion    Radiological Exams on Admission: Independently reviewed - see discussion in A/P where applicable  CT HEAD CODE STROKE WO CONTRAST  Result Date: 12/05/2020 CLINICAL DATA:  Code stroke. EXAM: CT HEAD WITHOUT CONTRAST TECHNIQUE: Contiguous axial images were obtained from the base of the skull through the vertex without intravenous contrast. COMPARISON:  11/03/2020 FINDINGS: Brain: There is no acute intracranial hemorrhage, mass effect, or edema. No new loss of  gray-white differentiation. Ventricles and sulci are stable in size and configuration. Stable findings of probable chronic microvascular ischemic changes in the cerebral white matter. No extra-axial collection. Vascular: No hyperdense vessel. Intracranial atherosclerotic calcifications at the skull base. Skull: Unremarkable. Sinuses/Orbits: No acute abnormality. Other: Mastoid air cells are clear. ASPECTS ([place] Stroke Program Early CT Score) - Ganglionic level infarction (caudate, lentiform nuclei, internal capsule, insula, M1-M3 cortex): 7 - Supraganglionic infarction (M4-M6 cortex): 3 Total score (0-10 with 10 being normal): 10 IMPRESSION: There is no acute intracranial hemorrhage or evidence of acute infarction. ASPECT score is 10. No change since prior study. These results were communicated to Dr. Leonie Man at 1:39 pm on 12/05/2020 by text page via the [facility] messaging system. Electronically Signed   By: Macy Mis M.D.   On: 12/05/2020 13:42   CT ANGIO HEAD NECK W WO CM (CODE STROKE)  Result Date: 12/05/2020 CLINICAL DATA:  Code stroke presentation. EXAM: CT ANGIOGRAPHY HEAD AND NECK TECHNIQUE: Multidetector CT imaging of the head and neck was performed using the standard protocol during bolus administration of intravenous contrast. Multiplanar CT image reconstructions and MIPs were obtained to evaluate the vascular anatomy. Carotid stenosis measurements (when applicable) are obtained utilizing NASCET criteria, using the distal internal carotid diameter as the denominator. CONTRAST:  43mL OMNIPAQUE IOHEXOL 350 MG/ML SOLN COMPARISON:  MRI study 08/16/2011.  Head CT earlier same day. FINDINGS: CTA NECK FINDINGS Aortic arch: Aortic atherosclerosis. Branching pattern is normal. Innominate artery origin is not included. Right carotid system: Common carotid artery widely patent to the bifurcation. Soft and calcified plaque at the carotid bifurcation but no stenosis. Cervical ICA is tortuous but widely patent to  the skull base. Left carotid system: Common carotid artery widely patent to the bifurcation. Calcified plaque at the carotid bifurcation and ICA bulb but no stenosis. Cervical ICA is tortuous but widely patent to the skull base. Vertebral arteries: Proximal subclavian arteries are widely patent. Both vertebral artery origins are widely patent. The left vertebral artery is dominant.  Both vertebral arteries appear normal through the cervical region to the foramen magnum. Skeleton: Ordinary degenerative cervical spondylosis. Other neck: No mass or lymphadenopathy. Upper chest: Normal Review of the MIP images confirms the above findings CTA HEAD FINDINGS Anterior circulation: Both internal carotid arteries are patent through the skull base and siphon regions. There is siphon atherosclerotic calcification but no stenosis greater than 30%. The anterior and middle cerebral vessels are patent without proximal stenosis, aneurysm or vascular malformation. No large or medium vessel occlusion is identified. Posterior circulation: Both vertebral arteries are patent through the foramen magnum to the basilar. No basilar stenosis. 30% stenosis of the left vertebral artery V4 segment. Posterior circulation branch vessels appear patent. Venous sinuses: Patent and normal. Anatomic variants: None significant. Review of the MIP images confirms the above findings IMPRESSION: No intracranial large or medium vessel occlusion. Atherosclerotic change at the carotid bifurcations and carotid siphon regions. No stenosis at the carotid bifurcation regions. No siphon stenosis greater than 30%. Findings discussed during interpretation at approximately 1355 hours with Dr. Leonie Man. Electronically Signed   By: Nelson Chimes M.D.   On: 12/05/2020 14:01    EKG: Independently reviewed.  NSR with rate 75; nonspecific ST changes with no evidence of acute ischemia   Labs on Admission: I have personally reviewed the available labs and imaging studies at  the time of the admission.  Pertinent labs:   Glucose 133 Albumin 2.7 Hgb 9.7 - stable INR 1.0   Assessment/Plan Principal Problem:   Acute metabolic encephalopathy Active Problems:   Hyperlipidemia   Hypothyroidism   HTN (hypertension)   Goals of care, counseling/discussion   Acute metabolic encephalopathy -Patient presenting with encephalopathy as evidenced by her AMS -While the patient has been recently diagnosed with dementia, this is a change compared to her usual baseline mental status -Concern for stroke based on left-sided weakness - which is not evident at this time -Evaluation thus far unremarkable -TIA/CVA remains a consideration; however, based on multiple recent hospitalizations, family prefers to proceed with conservative treatment with goal of comfort -No MRI -EEG is pending -PT/OT consults to determine if she continues to have rehab potential -Palliative care consult to determine if we will transition to full comfort with likely hospice disposition -Will admit to palliative care service at this time -Swallow evaluation and if passed can have regular diet  HTN -Hold metoprolol for permissive HTN -May be appropriate to resume in 1-2 days  HLD -Continue Pravachol for now  Dementia -Delirium precautions  CAD -s/p CABG  Hypothyroidism -Continue Synthroid    Note: This patient has been tested and is pending for the novel coronavirus COVID-19. The patient has been fully vaccinated against COVID-19.   Level of care: Palliative Care DVT prophylaxis: SCDs (recent GI bleed) Code Status: DNR - confirmed with family Family Communication: Nephew was present and we spoke by telephone with family friend Disposition Plan:  The patient is from: SNF Rehab  Anticipated d/c is to: be determined  Anticipated d/c date will depend on clinical response to treatment, likely 2-3 days  Patient is currently: acutely ill Consults called: Neurology; Palliative care;  PT/OT  Admission status:  Admit - It is my clinical opinion that admission to INPATIENT is reasonable and necessary because of the expectation that this patient will require hospital care that crosses at least 2 midnights to treat this condition based on the medical complexity of the problems presented.  Given the aforementioned information, the predictability of an adverse outcome is felt to  be significant.    Karmen Bongo MD [facility]   How to contact the [facility] Attending or Consulting provider [place] or covering provider during after hours [place], for this patient?  Check the care team in [facility] and look for a) attending/consulting [facility] provider listed and b) the [facility] team listed Log into [email or web address] and use [place]'s universal password to access. If you do not have the password, please contact the hospital operator. Locate the [facility] provider you are looking for under [facility] and page to a number that you can be directly reached. If you still have difficulty reaching the provider, please page the [facility] (Director on Call) for the Hospitalists listed on amion for assistance.   12/05/2020, 3:27 PM

## 2020-12-05 NOTE — Consult Note (Signed)
Neurology Consultation  Reason for Consult: Left-sided neglect, right gaze palsy Referring Physician: Dr. Ralene Bathe  CC: Confusion  History is obtained from: Stroke RN, EMS, Chart review, Patient's nephew at bedside  HPI: PANDA CROSSIN is a 87 y.o. female with a medical history significant for essential hypertension, coronary artery disease s/p CABG, hyperlipidemia, ischemic heart disease, myocardial infarction, paroxysmal atrial fibrillation not on anticoagulation l due to high risk of falls, vitamin D deficiency, breast cancer s/p right mastectomy, and vascular dementia who presented to the ED from her skilled nursing facility for evaluation of acute onset of confusion, left-sided neglect, and right gaze preference. Of note, Ms. Ressler was living independently, managing her own medications, and ambulating with a walker approximately 3 weeks ago. She sustained a fall on 11/03/2020 and was found confused with multiple skin abrasions. Per chart review, she returned to baseline mental status prior to discharge to a skilled nursing facility for further rehabilitation.  But according to the nephew patient's mental status was very off from work it was prior to the fall when she was living independently.  On 11/27/2020 she presented back to the ED for evaluation of rectal bleeding and was treated conservatively.  And her hemoglobin had dropped from 13.7-9.7.  Blood transfusion was considered but not given.  GI was consulted and deferred colonoscopy.  Per patient's nephew at bedside, patient has required significant assistance to complete her ADLs and has been progressively confused beyond baseline since her fall. Today, while at her nursing facility, staff found patient to be acutely confused with right gaze preference and left-sided neglect and EMS was activated for stroke evaluation.  Emergent CT scan of the head was obtained which I have personally reviewed showed no acute abnormality.  CT angiogram of the brain and neck  was also obtained which showed no large vessel stenosis or occlusion. LKW: 11:45 12/05/2020 tpa given?: no, recent GI bleed less than 1 week ago IR Thrombectomy? No, no LVO identified on imaging  Modified Rankin Scale: 5-Severe disability-bedridden, incontinent, needs constant attention  ROS: Unable to obtain due to altered mental status.   Past Medical History:  Diagnosis Date   Anemia    Arthritis    osteoarthritis   Breast cancer ([place]) 1975   Right sided mastectomy   Coronary artery disease    H/O hypokalemia    Hx of CABG    pt states she had 5 bypasses   Hyperlipidemia    Hypertension    Hypothyroidism    IHD (ischemic heart disease)    with CABG in August 2002   Myocardial infarction [facility])    Non Q-wave MI   Vitamin D deficiency    Past Surgical History:  Procedure Laterality Date   BREAST SURGERY     left breast cyst surgery    CARDIAC CATHETERIZATION  01/20/2001   NORMAL. EF 60-70%   CARDIOVASCULAR STRESS TEST  Aug 2010   Normal; EF 74%   CORONARY ARTERY BYPASS GRAFT  12/2000   LEFT INTERNAL MAMMARY TO THE LAD, SAPHENOUS VEIN GRAFT TO THE FIRST DIAGONAL, SAPHENOUS VEIN GRAFT TO THE RAMUS INTERMEDIATE, AND A SEQUENTIAL SAPHENOUS VEIN GRAFT TO THE POSTERIOR DESCENDING AND POSTERIOR LATERAL BRANCHES.   MASS EXCISION  04/08/2011   Procedure: EXCISION MASS;  Surgeon: Wynonia Sours, MD;  Location: [facility];  Service: Orthopedics;  Laterality: Left;  excision mass left palm   MASTECTOMY  1975   RIGHT SIDE   TUBAL LIGATION     1960's  Family History  Problem Relation Age of Onset   Alzheimer's disease Mother    Social History:   reports that she quit smoking about 51 years ago. Her smoking use included cigarettes. She has a 4.00 pack-year smoking history. She has never used smokeless tobacco. She reports that she does not drink alcohol and does not use drugs.  Medications  Current Facility-Administered Medications:    sodium chloride flush (NS) 0.9 %  injection 3 mL, 3 mL, Intravenous, Once, Quintella Reichert, MD  Current Outpatient Medications:    acetaminophen (TYLENOL) 325 MG tablet, Take 650 mg by mouth 3 (three) times daily., Disp: , Rfl:    Ascorbic Acid (C 500) 500 MG CHEW, Chew 500 mg by mouth daily., Disp: , Rfl:    diclofenac Sodium (VOLTAREN) 1 % GEL, Apply 2-4 g topically 4 (four) times daily as needed (for bilateral knee pain)., Disp: , Rfl:    furosemide (LASIX) 20 MG tablet, Take 20 mg by mouth daily., Disp: , Rfl:    hydrocortisone (ANUSOL-HC) 25 MG suppository, Place 1 suppository (25 mg total) rectally 2 (two) times daily as needed for hemorrhoids or anal itching., Disp: 24 suppository, Rfl: 1   levothyroxine (SYNTHROID, LEVOTHROID) 50 MCG tablet, Take 50 mcg by mouth daily before breakfast., Disp: , Rfl:    magnesium hydroxide (MILK OF MAGNESIA) 400 MG/5ML suspension, Take 30 mLs by mouth daily as needed for mild constipation., Disp: , Rfl:    metoprolol tartrate (LOPRESSOR) 25 MG tablet, Take 1 tablet (25 mg total) by mouth 2 (two) times daily., Disp: , Rfl:    pantoprazole (PROTONIX) 40 MG tablet, Take 1 tablet (40 mg total) by mouth daily., Disp: 30 tablet, Rfl: 3   Polyethyl Glycol-Propyl Glycol (SYSTANE OP), Place 2 drops into both eyes every morning., Disp: , Rfl:    pravastatin (PRAVACHOL) 20 MG tablet, Take 1 tablet (20 mg total) by mouth at bedtime., Disp: , Rfl:    senna-docusate (SENOKOT-S) 8.6-50 MG tablet, Take 1 tablet by mouth at bedtime as needed for mild constipation., Disp: , Rfl:    Sodium Phosphates (RA SALINE ENEMA RE), Place 1 enema rectally daily as needed (for constipation not relieved by Dulcolax suppository- call MD if no relief from the enema)., Disp: , Rfl:   Exam: Current vital signs: BP (!) 139/51 (BP Location: Right Arm)   Pulse 69   Temp 98.5 F (36.9 C) (Oral)   Resp 20   SpO2 100%  Vital signs in last 24 hours: Temp:  [98.5 F (36.9 C)] 98.5 F (36.9 C) (07/06 1345) Pulse Rate:   [53-69] 69 (07/06 1345) Resp:  [20] 20 (07/06 1345) BP: (123-139)/(51-65) 139/51 (07/06 1345) SpO2:  [100 %] 100 % (07/06 1338)  GENERAL: Frail appearing patient, laying on stretcher, in no acute distress Head: Normocephalic and atraumatic, without obvious abnormality EENT: No OP obstruction, edentulous, dry mucous membranes LUNGS: Normal respiratory effort. Non-labored breathing on room air CV: Regular rate on telemetry, no pedal edema noted ABDOMEN: Soft, non-tender, non-distended Ext: warm, without obvious deformity  NEURO:  Mental Status: Awake and alert. She does not answer orientation questions correctly. She states incorrectly that the month is November and when asked her age she states "old".  Speech/Language: speech is dysarthric, possibly 2/2 edentulous state.   Does not name objects, does not repeat phrases, comprehension is not intact.  Some left-sided neglect noted initially with improvement on further assessments.  Intermittently follows simple commands She has diminished attention, registration and recall.  Poor insight into condition. Cranial Nerves:  II: PERRL 4 mm/brisk. Will attend to visual stimuli in each lateral visual field intermittently. Intermittently neglects the left visual field. III, IV, VI: Right partial gaze palsy noted, she will cross midline with repeat stimulation in left visual field V: Sensation is intact to light touch and symmetrical to face. Does not blink to threat on the left visual field.  VII: Face is symmetric resting and smiling.  VIII: Hearing is intact to loud voice, she is hard of hearing  IX, X: Palate elevation is symmetric. Phonation normal.  XI: Head appears midline, patient does not shrug shoulders to command XII: Tongue protrudes midline Motor: Bilateral upper extremities with antigravity movement without pronator drift.  Left lower extremity with some antigravity movement but with immediate drift to bed on initial assessment.  And  does complain of pain in her hip. Right lower extremity with antigravity movement with minimal vertical drift on assessment.  Tone is normal. Bulk is decreased.  Sensation: Grimaces and yells "ouch" to light application of noxious stimuli throughout Coordination: Unable to assess, patient does not follow complex commands DTRs: 2+ and symmetric biceps and brachioradialis Gait: Deferred  NIHSS: 1a Level of Conscious.: 0 1b LOC Questions: 2 1c LOC Commands: 0 2 Best Gaze: 1 3 Visual: 2; does not blink to threat on the left visual field 4 Facial Palsy: 0 5a Motor Arm - left: 0 5b Motor Arm - Right: 0 6a Motor Leg - Left: 2 6b Motor Leg - Right: 1 7 Limb Ataxia: 0 8 Sensory: 0 9 Best Language: 1 10 Dysarthria: 1 11 Extinct. and Inatten.: 1 TOTAL: 11   Labs I have reviewed labs in epic and the results pertinent to this consultation are: CBC    Component Value Date/Time   WBC 7.4 12/05/2020 1326   RBC 3.51 (L) 12/05/2020 1326   HGB 9.9 (L) 12/05/2020 1334   HCT 29.0 (L) 12/05/2020 1334   PLT 274 12/05/2020 1326   MCV 87.2 12/05/2020 1326   MCH 27.6 12/05/2020 1326   MCHC 31.7 12/05/2020 1326   RDW 15.8 (H) 12/05/2020 1326   LYMPHSABS 0.8 12/05/2020 1326   MONOABS 0.5 12/05/2020 1326   EOSABS 0.1 12/05/2020 1326   BASOSABS 0.0 12/05/2020 1326   CMP     Component Value Date/Time   NA 137 12/05/2020 1334   K 3.9 12/05/2020 1334   CL 107 12/05/2020 1334   CO2 21 (L) 12/05/2020 1326   GLUCOSE 130 (H) 12/05/2020 1334   BUN 24 (H) 12/05/2020 1334   CREATININE 0.90 12/05/2020 1334   CALCIUM 8.9 12/05/2020 1326   CALCIUM 9.5 09/13/2011 1520   PROT 6.0 (L) 12/05/2020 1326   ALBUMIN 2.7 (L) 12/05/2020 1326   AST 15 12/05/2020 1326   ALT 11 12/05/2020 1326   ALKPHOS 48 12/05/2020 1326   BILITOT 0.7 12/05/2020 1326   GFRNONAA 57 (L) 12/05/2020 1326   GFRAA >60 10/08/2017 1815   Lipid Panel     Component Value Date/Time   CHOL 125 03/26/2013 1425   TRIG 57 03/26/2013  1425   HDL 56 03/26/2013 1425   CHOLHDL 2.2 03/26/2013 1425   VLDL 11 03/26/2013 1425   LDLCALC 58 03/26/2013 1425   Lab Results  Component Value Date   HGBA1C 5.5 03/26/2013   Imaging I have reviewed the images obtained: CT-scan of the brain 12/05/2020: There is no acute intracranial hemorrhage or evidence of acute infarction. ASPECT score is 10. No change since  prior study.  CT angio head and neck 12/05/2020: No intracranial large or medium vessel occlusion. Atherosclerotic change at the carotid bifurcations and carotid siphon regions. No stenosis at the carotid bifurcation regions. No siphon stenosis greater than 30%.  MRI examination of the brain pending  Assessment: 87 y.o. female who presented to the ED for evaluation of acute onset confusion, left-sided neglect, and right gaze palsy. Patient has had a progressive cognitive and functional decline over the past month since she fell at home on 11/07/2020 and has lost her independence. During reassessment, her left-sided neglect had improved with some residual partial right gaze palsy. Assessments varied with extremity strength per patient's cooperation. tPA was not given due to recent GIB and patient not a candidate for thrombectomy due to no LVO on vessel imaging. Ddx includes toxic / metabolic / infectious encephalopathy, seizure with post-ictal state- less likely without history of seizures, small stroke not visualized on CT imaging, or cerebral hypoperfusion without infarction.   Impression: Acute encephalopathy- etiology unclear-small right hemispheric stroke versus seizure with postictal state versus encephalopathy due to toxic metabolic etiology. Right partial gaze palsy Left-sided visual and sensory neglect - resolving   Recommendations: - MRI brain without contrast - Urinalysis and chest x-ray; infectious work up - Routine EEG to rule out seizure as etiology of acute encephalopathy -Check vitamin B12, TSH, RPR  Pt seen by  NP/Neuro and later by MD. Note/plan to be edited by MD as needed.  Anibal Henderson, AGAC-NP [facility] Pager: [phone]  STROKE MD ATTESTATION : I have personally obtained history,examined this patient, reviewed notes, independently viewed imaging studies, participated in medical decision making and plan of care.ROS completed by me personally and pertinent positives fully documented  I have made any additions or clarifications directly to the above note. Agree with note above.  Patient has past history of recent cognitive decline following a fall a month ago and was in rehab facility and had recent admission for GI bleed less than a week ago.  She was noted to have sudden onset of altered mental status with confusion and left-sided neglect.  Her neurological exam showed slight improvement since admission and CT scan and CT angio were negative for large vessel occlusion or stroke.  Recommend admission for further work-up check MRI scan for small stroke as well as EEG for seizures and work-up for infectious toxic metabolic etiology.  Long discussion with the patient's nephew [name] at the bedside and answered questions.  Patient is DNR and not a candidate for aggressive intervention as per him.  Discussed with Dr. Marin Roberts, ER, MD. Antony Contras, MD  Antony Contras, MD Medical Director [place] Pager: [phone] 12/05/2020 4:15 PM

## 2020-12-05 NOTE — ED Notes (Signed)
Pt in room moaning and crying. Reports pain in back.  Pt has removed cardiac monitor, D/c IV and has taken all clothing off.  Pt alert to name and place.  Answers questions appropriately at this time.  Pt continues to yell out in complaint of pain

## 2020-12-05 NOTE — Progress Notes (Signed)
EEG complete - results pending 

## 2020-12-05 NOTE — Code Documentation (Signed)
Stroke Response Nurse Documentation Code Documentation  Natalie Bullock is a 87 y.o. female arriving to [place] ED via [facility] on 12/04/2020 with past medical hx of GI Bleed, Fall, Cognitive Changes, HTN, CAD. Code stroke was activated by EMS. Patient from [facility] where she was LKW at 1145 and now complaining of increased confusion, left sided neglect.   Stroke team at the bedside on patient arrival. Labs drawn and patient cleared for CT by Dr. Ralene Bathe. Patient to CT with team. NIHSS 10, see documentation for details and code stroke times. Patient with disoriented, right gaze preference , left hemianopia, bilateral leg weakness, Expressive aphasia , dysarthria , and Sensory  neglect on exam. The following imaging was completed: (CT, CTA head and neck.  Patient is not a candidate for tPA due to recent GI bleed. Patient is not a IR candidate due to MRS 4 and family requesting no aggressive treatment.    Care/Plan: q2 mNIHSS/VS.  Bedside handoff with ED RN Hassan Rowan.    Kathrin Greathouse  Stroke Response RN

## 2020-12-06 LAB — HOMOCYSTEINE: Homocysteine: 20.9 umol/L (ref 0.0–21.3)

## 2020-12-06 LAB — RPR: RPR Ser Ql: NONREACTIVE

## 2020-12-06 NOTE — ED Notes (Signed)
Found pt in bed, again naked with cardiac monitor off.  Pt loudly singing Hymns.  Denies pain at this time.

## 2020-12-06 NOTE — Progress Notes (Signed)
Patient arrived to room 6N 22 from ER via stretcher. Alert and verbal. Will continue to assess.

## 2020-12-06 NOTE — Consult Note (Signed)
Palliative Care Consult Note                                  Date: 12/06/2020   Patient Name: Natalie Bullock  DOB: Oct 18, 1924  MRN: [id number]  Age / Sex: 87 y.o., female  PCP: Seward Carol, MD Referring Physician: Domenic Polite, MD  Reason for Consultation: Establishing goals of care  HPI/Patient Profile: 87 y.o. female  with past medical history of HTN, CAD s/p CABG in 2002, hypothyroidism, bilateral knee OA and memory loss since hospitalization on 11/03/20 admitted on 12/05/2020 with stoke symptoms. She was last admitted from 6/28-7/1 with GI bleeding, treated with conservative therapy.  Her nephew, who is her POA and who depends on an RN friend at [facility]) to help with decisions. He reports that she was doing well prior to last d/c and was ok but not as strong or engaged after d/c to SNF.  The facility called him this AM to report that she was unresponsive and also appeared to not be using her left arm/leg.  She has become more interactive while in the ER.  Family decided on comfort focus, no escalation of care, no invasive/bothersome procedures. Declined MRI. Not a TPA candidate.  Prior to hospitalization in June, she was independent but has since had significant decline. After her last admission, MOST was completed and patient was d/c to [facility] with outpatient Palliative Care with [facility].  Since admission, the patient has improved. Concern for TIA/CVA based on right gaze palsy with left-sided neglect which is improving. PT/OT consult recommended 24 hour care per their notes. Passed swallow eval and placed on regular diet. EEG no seizures.   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)    Stroke ([place])    Vitamin D deficiency     Social History   Socioeconomic History   Marital status: Widowed    Spouse name: Not on file   Number of children: Not on file   Years of education: Not on file   Highest education level: Not on file  Occupational History   Not on file  Tobacco Use   Smoking status: Former    Packs/day: 1.00    Years: 4.00    Pack years: 4.00    Types: Cigarettes    Quit date: 06/02/1969    Years since quitting: 51.5   Smokeless tobacco: Never  Substance and Sexual Activity   Alcohol use: No   Drug use: No   Sexual activity: Never  Other Topics Concern   Not on file  Social History Narrative   Not on file   Social Determinants of Health   Financial Resource Strain: Not on file  Food Insecurity: Not on file  Transportation Needs: Not on file  Physical Activity: Not on file  Stress: Not on file  Social Connections:  Not on file    Family History  Problem Relation Age of Onset   Alzheimer's disease Mother     Subjective:   This NP Walden Field reviewed medical records, received report from team, assessed the patient and then meet at the patient's bedside  to discuss diagnosis, prognosis, GOC, EOL wishes disposition and options.   Concept of Palliative Care was introduced as specialized medical care for people and their families living with serious illness.  If focuses on providing relief from the symptoms and stress of a serious illness.  The goal is to improve quality of life for both the patient and the family. Values and goals of care important to patient and family were attempted to be elicited.  Created space and opportunity for patient and family to explore thoughts and feelings regarding current medical situation    Natural trajectory and expectations at EOL were discussed. Questions and concerns addressed. Patient  encouraged to call with questions or concerns.    Today's discussion: Today the patient appeared orientated as first (oriented x 3), but actually pleasently confused with medium to short-term memory recall issues. Denies pain, dyspnea, N/V. States she's doing fine. Telling jokes and laughing. At times looks like she's suspicious. Does remember her nephew Education officer, community). No complaints.  I called and spoke with her nephew/HCPOA Natalie Bullock. States when he first saw her after her readmission he thought she had had a stoke and "that this was it." However, another relative that visited her today told him she looked a lot better today. He states when he saw her after admission she didn't recognize him and, when she did eventually recognize him, she couldn't remember his name. We discussed that with dementia (which this appears to be related to) there are often "good days and bad days." He states he was told they're not 100% sure why she had her acute change.  We reviewed that she would likely be going back to [facility] and I would ensure that her previous OP Palliative Care Water quality scientist) referral was in place. He asked that he be notified when they plan to see her so he can be there. We also discussed that she could move to hospice services if/when her body told us it was time to do that. He agreed.  Patient/Family Understanding of Illness: Understands this may likely be due to her confusion/possible dementia. He knows there will be good and bad days. He agrees we should take it one day at a time and make further decisions based on how she does moving forward.  Review of Systems  Constitutional:        Generally no pain  Respiratory:  Negative for cough and shortness of breath.   Gastrointestinal:  Negative for nausea and vomiting.   Objective:   Primary Diagnoses: Present on Admission:   Acute metabolic encephalopathy  Hyperlipidemia  Hypothyroidism  HTN (hypertension)   Scheduled Meds:  levothyroxine  50 mcg Oral QAC breakfast   pantoprazole  40 mg Oral Daily   pravastatin  20 mg Oral QHS    Continuous Infusions:   PRN Meds: acetaminophen **OR** acetaminophen, antiseptic oral rinse, diphenhydrAMINE, glycopyrrolate **OR** glycopyrrolate **OR** glycopyrrolate, haloperidol **OR** haloperidol **OR** haloperidol lactate, hydrocortisone, magnesium hydroxide, morphine CONCENTRATE **OR** morphine CONCENTRATE, ondansetron **OR** ondansetron (ZOFRAN) IV, polyvinyl alcohol, senna-docusate  Allergies  Allergen Reactions   Valium Shortness Of Breath and Other (See Comments)    "ALLERGIC," per [facility]   Codeine Other (See Comments)    "ALLERGIC," per [facility]  Crestor [Rosuvastatin Calcium] Other (See Comments)    "ALLERGIC," per [facility]   Doxycycline Other (See Comments)    "ALLERGIC," per [facility]   Ezetimibe-Simvastatin Other (See Comments)    Vytorin- "ALLERGIC," per [facility]   Lipitor [Atorvastatin Calcium] Other (See Comments)    "ALLERGIC," per [facility]   Sulfa Drugs Cross Reactors Other (See Comments)    "ALLERGIC," per [facility]   Tequin Other (See Comments)    "ALLERGIC," per [facility]   Zocor [Simvastatin] Other (See Comments)    "ALLERGIC," per [facility]    Physical Exam Vitals and nursing note reviewed.  Constitutional:      General: She is not in acute distress.    Appearance: She is not toxic-appearing.  HENT:     Head: Normocephalic and atraumatic.  Cardiovascular:     Rate and Rhythm: Normal rate and regular rhythm.     Heart sounds: Normal heart sounds.  Pulmonary:     Effort: Pulmonary effort is normal. No respiratory distress.     Breath sounds: Normal breath sounds. No wheezing.  Abdominal:     General: Abdomen is flat. Bowel sounds are normal.     Palpations: Abdomen is soft.     Tenderness: There is no abdominal tenderness.  Skin:    General: Skin is warm and dry.   Neurological:     Mental Status: She is alert and oriented to person, place, and time.  Psychiatric:        Mood and Affect: Mood normal.        Behavior: Behavior normal.    Vital Signs:  BP (!) 147/55 (BP Location: Right Arm)   Pulse (!) 57   Temp 97.6 F (36.4 C) (Oral)   Resp 17   Wt 54.9 kg   SpO2 100%   BMI 21.44 kg/m  Pain Scale: 0-10   Pain Score: 9   SpO2: SpO2: 100 % O2 Device:SpO2: 100 % O2 Flow Rate: .   IO: Intake/output summary:  Intake/Output Summary (Last 24 hours) at 12/06/2020 1502 Last data filed at 12/06/2020 0900 Gross per 24 hour  Intake 120 ml  Output --  Net 120 ml    LBM: Last BM Date: 12/06/20 Baseline Weight: Weight: 54.9 kg Most recent weight: Weight: 54.9 kg      Palliative Assessment/Data: 40%   Advanced Care Planning:   Primary Decision Maker: HCPOA  Code Status/Advance Care Planning: DNR  Decisions/Changes to ACP: No changes MOST form, HCPOA form, DNR in system ([facility])  Assessment & Plan:   I have reviewed the medical record, interviewed the patient and family, and examined the patient. The following aspects are pertinent.  Impression: Pleasantly confused but generally oriented 87 year old female who was previously independent several months ago.  However, since admission after a fall in June she has had an abrupt change.  Now seems to be more in a mid dementia stage with waxing and waning confusion.  Presented with concerns for possible stroke, which is possible, although family declined MRI and aggressive/bothersome care.  At this point she seems to be near her baseline from what I saw during her last admission.  Plans are for discharge back to skilled nursing facility for 24-hour care.  Previous referral for outpatient palliative care has been made.  I called [facility] to confirm that referral was in place and was told that they would check.  Also indicated that this nephew/healthcare power of attorney would like to be there for the  first visit.  SUMMARY OF RECOMMENDATIONS  Return to SNF/Long term care Outpatient palliative care as previously recommended Care per MOST form and HCPOA wishes PMT continued support  Symptom Management:  No palliative symptom management needed at this time  Palliative Prophylaxis:  Frequent Pain Assessment, Oral Care, and Turn Reposition  Psycho-social/Spiritual:  Additional Recommendations: Caregiving  Support/Resources  Prognosis:  Unable to determine  Discharge Planning:  [facility] for rehab with Palliative care service follow-up   Discussed with: Dr. Broadus John, Natalie Bullock (HCPOA), Maretta Bees (RN)   Thank you for allowing [place] to participate in the care of Natalie Bullock PMT will continue to support holistically.  Time In: 3:00 Time Out: 3:45 Time Total: 45 min  Greater than 50%  of this time was spent counseling and coordinating care related to the above assessment and plan.  Signed by: Walden Field, NP Palliative Medicine Team  Team Phone # [phone] (Nights/Weekends)  12/06/2020, 3:02 PM

## 2020-12-06 NOTE — Plan of Care (Signed)
  Problem: Education: Goal: Knowledge of General Education information will improve Description: Including pain rating scale, medication(s)/side effects and non-pharmacologic comfort measures Outcome: Progressing   Problem: Health Behavior/Discharge Planning: Goal: Ability to manage health-related needs will improve Outcome: Progressing   Problem: Clinical Measurements: Goal: Ability to maintain clinical measurements within normal limits will improve Outcome: Progressing Goal: Will remain free from infection Outcome: Progressing Goal: Diagnostic test results will improve Outcome: Progressing Goal: Respiratory complications will improve Outcome: Progressing Goal: Cardiovascular complication will be avoided Outcome: Progressing   Problem: Clinical Measurements: Goal: Will remain free from infection Outcome: Progressing   Problem: Clinical Measurements: Goal: Diagnostic test results will improve Outcome: Progressing   Problem: Skin Integrity: Goal: Risk for impaired skin integrity will decrease Outcome: Progressing

## 2020-12-06 NOTE — TOC Progression Note (Signed)
Transition of Care [facility]) - Progression Note    Patient Details  Name: Natalie Bullock MRN: [id number] Date of Birth: 11/24/1924  Transition of Care [facility]) CM/SW [place],  Phone Number: 12/06/2020, 1:38 PM  Clinical Narrative:     Damaris Schooner with Lexine Baton from [facility]. They would be able to accept pt back tomorrow if medically ready. Covid test from 12/05/20 will be good for DC on 12/07/20. CSW to complete FL2 and fax to [employer] in [place].        Expected Discharge Plan and Services                                                 Social Determinants of Health (SDOH) Interventions    Readmission Risk Interventions No flowsheet data found.

## 2020-12-06 NOTE — Evaluation (Signed)
Occupational Therapy Evaluation Patient Details Name: Natalie Bullock MRN: [id number] DOB: Apr 27, 1925 Today's Date: 12/06/2020    History of Present Illness Pt is a 87 y.o. female who presented 7/6 due to being unresponsive with L-sided weakness. Concern for stroke. Noted encephalopathy. Of note, pt recently admitted 6/28-7/1 for GI bleed. PMH: remote breast cancer; CAD s/p CABG; HTN; HLD; dementia; and hypothyroidism.   Clinical Impression   Pt admitted from [facility] due to unresponsive and L side weakness. Pt  required mod to max assist with x2 with all bed mobility and transfers. Attempted with the pt to use the walker and stedy but fearful of falling and B knees making contact to surfaces. Pt then required moderate to max x 2 assist to transfer from bed to drop down chair as unable to clear railing.  Pt currently with functional limitations due to the deficits listed below (see OT Problem List).  Pt will benefit from skilled OT to increase their safety and independence with ADL and functional mobility for ADL to facilitate discharge to venue listed below.      Follow Up Recommendations  SNF;Supervision/Assistance - 24 hour;Other (comment) (back to [place])    Equipment Recommendations       Recommendations for Other Services       Precautions / Restrictions Precautions Precautions: Fall Precaution Comments: HOH Restrictions Weight Bearing Restrictions: No      Mobility Bed Mobility Overal bed mobility: Needs Assistance Bed Mobility: Rolling;Sidelying to Sit Rolling: Mod assist Sidelying to sit: Mod assist;+2 for physical assistance       General bed mobility comments: Pt grasping bil hands of therapist to pull to roll onto L side, modA. Directed pt's hands to bed rail to allow assistance at legs and trunk to sit up, needing assistance to bring R leg off EOB and ascend trunk, modAx2 as pt fearful and slightly resistive with trunk movement.    Transfers Overall  transfer level: Needs assistance Equipment used: Rolling walker (2 wheeled);Ambulation equipment used;1 person hand held assist Transfers: Sit to/from [employer] Sit to Stand: Max assist;+2 physical assistance   Squat pivot transfers: Max assist;+2 physical assistance     General transfer comment: Attempted sit to stand from EOB to RW 1x and to stedy 2x, but pt with tendency to lean posteriorly with hips flexed and knees not obtaining full extension and feet sliding anteriorly even with bil knee block and maxAx2. Pt fearful and reports fear of falling. Thus, changed approach to squat pivot to towards R with recliner arm dropped, cuing pt to hug therapist anterior to her to avoid her grasping objects and while providing bil knee block, maxAx2.    Balance Overall balance assessment: Needs assistance Sitting-balance support: Bilateral upper extremity supported;Feet supported Sitting balance-Leahy Scale: Poor Sitting balance - Comments: Reliant on bil UE support and min guard-modA posteriorly. Postural control: Posterior lean Standing balance support: Bilateral upper extremity supported;During functional activity Standing balance-Leahy Scale: Poor Standing balance comment: Reliant on bil UE support and maxAx2.                           ADL either performed or assessed with clinical judgement   ADL Overall ADL's : Needs assistance/impaired Eating/Feeding: Set up;Sitting   Grooming: Wash/dry hands;Wash/dry face;Min guard;Sitting   Upper Body Bathing: Moderate assistance;Sitting   Lower Body Bathing: Maximal assistance;+2 for physical assistance;+2 for safety/equipment;Cueing for safety;Cueing for sequencing;Sitting/lateral leans   Upper Body Dressing :  Moderate assistance;Cueing for safety;Cueing for sequencing;Sitting   Lower Body Dressing: Maximal assistance;+2 for physical assistance;+2 for safety/equipment;Sitting/lateral leans       Toileting- Clothing  Manipulation and Hygiene: +2 for physical assistance;+2 for safety/equipment;Maximal assistance;Bed level         General ADL Comments: pt was very fearful of falling and did not want B knees to tough steady when attempting to use     Vision Baseline Vision/History: Wears glasses (per pt reprots but none located in room)       Perception     Praxis      Pertinent Vitals/Pain Pain Assessment: Faces Faces Pain Scale: Hurts even more Pain Location: bil knees Pain Descriptors / Indicators: Grimacing;Guarding;Moaning Pain Intervention(s): Limited activity within patient's tolerance     Hand Dominance Right   Extremity/Trunk Assessment Upper Extremity Assessment Upper Extremity Assessment: Generalized weakness (very trace difference in weakness of LUE strength to the R)   Lower Extremity Assessment Lower Extremity Assessment: Defer to PT evaluation   Cervical / Trunk Assessment Cervical / Trunk Assessment: Kyphotic   Communication Communication Communication: HOH   Cognition Arousal/Alertness: Awake/alert Behavior During Therapy: Anxious Overall Cognitive Status: History of cognitive impairments - at baseline                                 General Comments: Pt reported not knowing what day it was or the time of day was. Pt anxious and reports fear of falling with all mobility, quickly changing hand positions and grasping objects out of anxiety. Needs redirection and salient activities to encourage participation.   General Comments       Exercises     Shoulder Instructions      Home Living Family/patient expects to be discharged to:: Skilled nursing facility                                 Additional Comments: From [place]      Prior Functioning/Environment Level of Independence: Needs assistance        Comments: Pt unable to report, but assume she likely required assist for mobility tasks. per prior admit in June 2022 pt  required mod-maxAx2 with a RW to mobilize.        OT Problem List: Decreased strength;Decreased range of motion;Decreased activity tolerance;Impaired balance (sitting and/or standing);Decreased safety awareness;Pain      OT Treatment/Interventions: Self-care/ADL training;Therapeutic exercise;Therapeutic activities;Patient/family education;Balance training    OT Goals(Current goals can be found in the care plan section) Acute Rehab OT Goals Patient Stated Goal: to eat breakfast OT Goal Formulation: With patient Time For Goal Achievement: 12/22/20 Potential to Achieve Goals: Good ADL Goals Pt Will Perform Grooming: with min guard assist;standing Pt Will Perform Upper Body Dressing: with min guard assist;sitting Pt Will Perform Lower Body Dressing: with min assist;sit to/from stand Pt Will Transfer to Toilet: with min assist;ambulating;bedside commode  OT Frequency: Min 2X/week   Barriers to D/C:            Co-evaluation PT/OT/SLP Co-Evaluation/Treatment: Yes Reason for Co-Treatment: Necessary to address cognition/behavior during functional activity   OT goals addressed during session: ADL's and self-care      AM-PAC OT "6 Clicks" Daily Activity     Outcome Measure Help from another person eating meals?: A Little Help from another person taking care of personal grooming?: A Little Help from  another person toileting, which includes using toliet, bedpan, or urinal?: Total Help from another person bathing (including washing, rinsing, drying)?: A Lot Help from another person to put on and taking off regular upper body clothing?: A Lot Help from another person to put on and taking off regular lower body clothing?: Total 6 Click Score: 12   End of Session Equipment Utilized During Treatment: Gait belt Nurse Communication: Mobility status  Activity Tolerance: Other (comment) (pt is very fearful of falling) Patient left: in chair;with call bell/phone within reach;with chair alarm  set  OT Visit Diagnosis: Unsteadiness on feet (R26.81);Other abnormalities of gait and mobility (R26.89);History of falling (Z91.81);Pain Pain - Right/Left:  (B knees) Pain - part of body: Knee                Time: 0820-0853 OT Time Calculation (min): 33 min Charges:  OT General Charges $OT Visit: 1 Visit OT Evaluation $OT Eval Low Complexity: [place] OTR/L  Acute Rehab Services  [phone] office number [phone] pager number   Joeseph Amor 12/06/2020, 1:12 PM

## 2020-12-06 NOTE — Evaluation (Signed)
Physical Therapy Evaluation Patient Details Name: Natalie Bullock MRN: [id number] DOB: [birth year] Today's Date: 12/06/2020   History of Present Illness  Pt is a 87 y.o. female who presented 7/6 due to being unresponsive with L-sided weakness. Concern for stroke. Noted encephalopathy. Of note, pt recently admitted 6/28-7/1 for GI bleed. PMH: remote breast cancer; CAD s/p CABG; HTN; HLD; dementia; and hypothyroidism.   Clinical Impression  Pt presents with condition above and deficits mentioned below, see PT Problem List. PTA, she was living at [facility] and likely needing physical assistance for all functional mobility. During a recent admission, she was requiring +2 mod-maxA to ambulate with a RW. Of note, during a recent admission and PT session on 11/05/20 she had a moment of being unresponsive that was suspected to possibly due to orthostatic hypotension and she displayed some L leg weakness during gait. Currently, pt displays decreased bil knee ROM, generalized weakness, balance deficits, impaired cognition, fear of falling, and excessive L hip adduction that impact her independence and safety with all functional mobility. Pt was unable to obtain a full upright standing position despite multiple attempts with various AD/AE, including a stedy. Pt needing mod-maxAx2 for all bed mobility and transfers at this time. Will continue to follow acutely. Pt has potential to improve functionally as she was participating and was mod I living at home about a month PTA. Thus, recommend follow-up with SNF.    Follow Up Recommendations SNF;Supervision/Assistance - 24 hour (return to [facility])    Financial risk analyst (measurements PT);Wheelchair cushion (measurements PT);Hospital bed;3in1 (PT)    Recommendations for Other Services       Precautions / Restrictions Precautions Precautions: Fall Precaution Comments: HOH Restrictions Weight Bearing Restrictions: No      Mobility  Bed  Mobility Overal bed mobility: Needs Assistance Bed Mobility: Rolling;Sidelying to Sit Rolling: Mod assist Sidelying to sit: Mod assist;+2 for physical assistance       General bed mobility comments: Pt grasping bil hands of therapist to pull to roll onto L side, modA. Directed pt's hands to bed rail to allow assistance at legs and trunk to sit up, needing assistance to bring R leg off EOB and ascend trunk, modAx2 as pt fearful and slightly resistive with trunk movement.    Transfers Overall transfer level: Needs assistance Equipment used: Rolling walker (2 wheeled);Ambulation equipment used;1 person hand held assist Transfers: Sit to/from [employer] Sit to Stand: Max assist;+2 physical assistance   Squat pivot transfers: Max assist;+2 physical assistance     General transfer comment: Attempted sit to stand from EOB to RW 1x and to stedy 2x, but pt with tendency to lean posteriorly with hips flexed and knees not obtaining full extension and feet sliding anteriorly even with bil knee block and maxAx2. Pt fearful and reports fear of falling. Thus, changed approach to squat pivot to towards R with recliner arm dropped, cuing pt to hug therapist anterior to her to avoid her grasping objects and while providing bil knee block, maxAx2.  Ambulation/Gait             General Gait Details: Unable at this time.  Stairs            Wheelchair Mobility    Modified Rankin (Stroke Patients Only) Modified Rankin (Stroke Patients Only) Pre-Morbid Rankin Score: Moderately severe disability Modified Rankin: Severe disability     Balance Overall balance assessment: Needs assistance Sitting-balance support: Bilateral upper extremity supported;Feet supported Sitting balance-Leahy Scale: Poor Sitting  balance - Comments: Reliant on bil UE support and min guard-modA posteriorly. Postural control: Posterior lean Standing balance support: Bilateral upper extremity  supported;During functional activity Standing balance-Leahy Scale: Poor Standing balance comment: Reliant on bil UE support and maxAx2.                             Pertinent Vitals/Pain Pain Assessment: Faces Faces Pain Scale: Hurts even more Pain Location: bil knees Pain Descriptors / Indicators: Grimacing;Guarding;Moaning Pain Intervention(s): Limited activity within patient's tolerance;Monitored during session;Repositioned    Home Living Family/patient expects to be discharged to:: Skilled nursing facility                 Additional Comments: From [facility]    Prior Function Level of Independence: Needs assistance         Comments: Pt unable to report, but assume she likely required assist for mobility tasks. per prior admit in June 2022 pt required mod-maxAx2 with a RW to mobilize.     Hand Dominance   Dominant Hand: Right    Extremity/Trunk Assessment   Upper Extremity Assessment Upper Extremity Assessment: Defer to OT evaluation    Lower Extremity Assessment Lower Extremity Assessment: LLE deficits/detail (decreased bil knee flexion and extension ROM) LLE Deficits / Details: Tends to maintain L hip in adduction; denies numbness/tingling; possible trace weakness compared to R but unreliable formal testing due to difficulty remaining on task and following directions; mass/localized swelling noted L medial lower leg LLE Sensation:  (denies numbness/tingling) LLE Coordination: decreased gross motor    Cervical / Trunk Assessment Cervical / Trunk Assessment: Kyphotic  Communication   Communication: HOH  Cognition Arousal/Alertness: Awake/alert Behavior During Therapy: Anxious Overall Cognitive Status: History of cognitive impairments - at baseline                                 General Comments: Pt reported not knowing what day it was or the time of day was. Pt anxious and reports fear of falling with all mobility, quickly  changing hand positions and grasping objects out of anxiety. Needs redirection and salient activities to encourage participation.      General Comments      Exercises     Assessment/Plan    PT Assessment Patient needs continued PT services  PT Problem List Decreased strength;Decreased range of motion;Decreased activity tolerance;Decreased balance;Decreased mobility;Decreased coordination;Decreased cognition;Decreased knowledge of use of DME;Decreased safety awareness;Pain       PT Treatment Interventions DME instruction;Gait training;Functional mobility training;Therapeutic activities;Therapeutic exercise;Balance training;Neuromuscular re-education;Cognitive remediation;Patient/family education    PT Goals (Current goals can be found in the Care Plan section)  Acute Rehab PT Goals Patient Stated Goal: to eat breakfast PT Goal Formulation: With patient Time For Goal Achievement: 12/20/20 Potential to Achieve Goals: Fair    Frequency Min 2X/week   Barriers to discharge        Co-evaluation PT/OT/SLP Co-Evaluation/Treatment: Yes Reason for Co-Treatment: Necessary to address cognition/behavior during functional activity;For patient/therapist safety;To address functional/ADL transfers PT goals addressed during session: Mobility/safety with mobility;Balance;Proper use of DME         AM-PAC PT "6 Clicks" Mobility  Outcome Measure Help needed turning from your back to your side while in a flat bed without using bedrails?: A Lot Help needed moving from lying on your back to sitting on the side of a flat bed without using bedrails?:  Total Help needed moving to and from a bed to a chair (including a wheelchair)?: Total Help needed standing up from a chair using your arms (e.g., wheelchair or bedside chair)?: Total Help needed to walk in hospital room?: Total Help needed climbing 3-5 steps with a railing? : Total 6 Click Score: 7    End of Session Equipment Utilized During  Treatment: Gait belt Activity Tolerance: Other (comment);Patient limited by pain (limited by anxiety) Patient left: in chair;with call bell/phone within reach;with chair alarm set Nurse Communication: Mobility status;Need for lift equipment;Other (comment) (chair alarm beeping for new batteries, secretary notified as no batteries on unit with secretary confirming will order and replace pt's) PT Visit Diagnosis: Other abnormalities of gait and mobility (R26.89);Difficulty in walking, not elsewhere classified (R26.2);Unsteadiness on feet (R26.81);Muscle weakness (generalized) (M62.81);Pain Pain - Right/Left:  (bil) Pain - part of body: Knee    Time: 0821-0855 PT Time Calculation (min) (ACUTE ONLY): 34 min   Charges:   PT Evaluation $PT Eval Moderate Complexity: 1 Mod          Moishe Spice, PT, DPT Acute Rehabilitation Services  Pager: [phone] Office: [phone]   Orvan Falconer 12/06/2020, 9:20 AM

## 2020-12-06 NOTE — Plan of Care (Signed)

## 2020-12-06 NOTE — Progress Notes (Addendum)
PROGRESS NOTE    Natalie Bullock  TGY:[id number] DOB: 03-29-1925 DOA: 12/05/2020 PCP: Seward Carol, MD  Brief Narrative: 87 year old female history of CAD/CABG, hypertension, paroxysmal atrial fibrillation not on anticoagulation, vitamin D deficiency, breast cancer, vascular dementia presented to the ED from SNF for evaluation of acute onset of confusion and left-sided neglect and right gaze preference.  She had been living independently and ambulating with a walker until about 3 to 4 weeks ago.  Was hospitalized with lower GI bleed in 6/28, treated conservatively and discharged to SNF for rehabilitation.  On 7/6 the staff noted that she was acutely confused with left-sided neglect and right gaze preference and subsequently sent to the ED. -Seen by stroke team in the ER yesterday, CTA brain and neck without large vessel stenosis or occlusion   Assessment & Plan:   Acute encephalopathy -Appears to be improving -Concern for TIA/CVA based on right gaze palsy and left sided neglect yesterday which is improving -PT OT eval today, passed swallow screen -EEG negative for seizures -MRI brain was not pursued due to family's concern that she would not be able to lay still and tolerate this and plan for conservative comfort focused management only -UA pending -Will need palliative care follow-up  Hypertension -Resume metoprolol tomorrow  Dementia -Delirium precautions  CAD/CABG -Resume metoprolol tomorrow, continue Pravachol  Hypothyroidism -Continue Synthroid  DVT prophylaxis: SCDs Code Status: DNR Family Communication: No family at bedside, will update nephew Disposition Plan:  Status is: Inpatient  Remains inpatient appropriate because:Altered mental status  Dispo: The patient is from: SNF              Anticipated d/c is to: SNF              Patient currently is not medically stable to d/c.   Difficult to place patient No        Consultants:  Neurology  Procedures:    Antimicrobials:    Subjective: -Feels okay, denies any complaints  Objective: Vitals:   12/06/20 0330 12/06/20 0400 12/06/20 0414 12/06/20 0438  BP: (!) 152/57 132/73  (!) 147/55  Pulse: 64 76  (!) 57  Resp: 16   17  Temp:   98.1 F (36.7 C) 97.6 F (36.4 C)  TempSrc:    Oral  SpO2: 100% 100%  100%  Weight:    54.9 kg   No intake or output data in the 24 hours ending 12/06/20 1103 Filed Weights   12/06/20 0438  Weight: 54.9 kg    Examination:  General exam: Pleasant elderly female sitting up in recliner, awake alert oriented to self and partly to place, cognitive deficits noted CVS: S1-S2, regular rate rhythm Lungs: Clear bilaterally Abdomen: Soft, nontender, bowel sounds present Extremities: No edema Neuro: Moves all extremities, no localizing signs, mild chronic left-leg weakness  Skin: No rashes Psychiatry: Poor insight and judgment    Data Reviewed:   CBC: Recent Labs  Lab 12/05/20 1326 12/05/20 1334  WBC 7.4  --   NEUTROABS 5.9  --   HGB 9.7* 9.9*  HCT 30.6* 29.0*  MCV 87.2  --   PLT 274  --    Basic Metabolic Panel: Recent Labs  Lab 12/05/20 1326 12/05/20 1334  NA 136 137  K 3.7 3.9  CL 106 107  CO2 21*  --   GLUCOSE 133* 130*  BUN 20 24*  CREATININE 0.94 0.90  CALCIUM 8.9  --    GFR: Estimated Creatinine Clearance: 32.3 mL/min (by C-G formula  based on SCr of 0.9 mg/dL). Liver Function Tests: Recent Labs  Lab 12/05/20 1326  AST 15  ALT 11  ALKPHOS 48  BILITOT 0.7  PROT 6.0*  ALBUMIN 2.7*   No results for input(s): LIPASE, AMYLASE in the last 168 hours. No results for input(s): AMMONIA in the last 168 hours. Coagulation Profile: Recent Labs  Lab 12/05/20 1326  INR 1.0   Cardiac Enzymes: No results for input(s): CKTOTAL, CKMB, CKMBINDEX, TROPONINI in the last 168 hours. BNP (last 3 results) No results for input(s): PROBNP in the last 8760 hours. HbA1C: No results for input(s): HGBA1C in the last 72  hours. CBG: Recent Labs  Lab 12/05/20 1403  GLUCAP 108*   Lipid Profile: No results for input(s): CHOL, HDL, LDLCALC, TRIG, CHOLHDL, LDLDIRECT in the last 72 hours. Thyroid Function Tests: Recent Labs    12/05/20 1619  TSH 0.724   Anemia Panel: Recent Labs    12/05/20 1619  VITAMINB12 305   Urine analysis:    Component Value Date/Time   COLORURINE YELLOW 11/03/2020 0954   APPEARANCEUR HAZY (A) 11/03/2020 0954   LABSPEC 1.024 11/03/2020 0954   PHURINE 6.0 11/03/2020 0954   GLUCOSEU NEGATIVE 11/03/2020 0954   HGBUR NEGATIVE 11/03/2020 0954   BILIRUBINUR NEGATIVE 11/03/2020 0954   KETONESUR NEGATIVE 11/03/2020 0954   PROTEINUR NEGATIVE 11/03/2020 0954   UROBILINOGEN 1.0 03/26/2013 0624   NITRITE NEGATIVE 11/03/2020 0954   LEUKOCYTESUR TRACE (A) 11/03/2020 0954   Sepsis Labs: @LABRCNTIP (procalcitonin:4,lacticidven:4)  ) Recent Results (from the past 240 hour(s))  Resp Panel by RT-PCR (Flu A&B, Covid) Nasopharyngeal Swab     Status: None   Collection Time: 11/27/20  2:10 PM   Specimen: Nasopharyngeal Swab; Nasopharyngeal(NP) swabs in vial transport medium  Result Value Ref Range Status   SARS Coronavirus 2 by RT PCR NEGATIVE NEGATIVE Final    Comment: (NOTE) SARS-CoV-2 target nucleic acids are NOT DETECTED.  The SARS-CoV-2 RNA is generally detectable in upper respiratory specimens during the acute phase of infection. The lowest concentration of SARS-CoV-2 viral copies this assay can detect is 138 copies/mL. A negative result does not preclude SARS-Cov-2 infection and should not be used as the sole basis for treatment or other patient management decisions. A negative result may occur with  improper specimen collection/handling, submission of specimen other than nasopharyngeal swab, presence of viral mutation(s) within the areas targeted by this assay, and inadequate number of viral copies(<138 copies/mL). A negative result must be combined with clinical  observations, patient history, and epidemiological information. The expected result is Negative.  Fact Sheet for Patients:  EntrepreneurPulse.com.au  Fact Sheet for Healthcare Providers:  IncredibleEmployment.be  This test is no t yet approved or cleared by the [place] FDA and  has been authorized for detection and/or diagnosis of SARS-CoV-2 by FDA under an Emergency Use Authorization (EUA). This EUA will remain  in effect (meaning this test can be used) for the duration of the COVID-19 declaration under Section 564(b)(1) of the Act, 21 U.S.C.section 360bbb-3(b)(1), unless the authorization is terminated  or revoked sooner.       Influenza A by PCR NEGATIVE NEGATIVE Final   Influenza B by PCR NEGATIVE NEGATIVE Final    Comment: (NOTE) The Xpert Xpress SARS-CoV-2/FLU/RSV plus assay is intended as an aid in the diagnosis of influenza from Nasopharyngeal swab specimens and should not be used as a sole basis for treatment. Nasal washings and aspirates are unacceptable for Xpert Xpress SARS-CoV-2/FLU/RSV testing.  Fact Sheet for Patients: EntrepreneurPulse.com.au  Fact Sheet for Healthcare Providers: IncredibleEmployment.be  This test is not yet approved or cleared by the [place] FDA and has been authorized for detection and/or diagnosis of SARS-CoV-2 by FDA under an Emergency Use Authorization (EUA). This EUA will remain in effect (meaning this test can be used) for the duration of the COVID-19 declaration under Section 564(b)(1) of the Act, 21 U.S.C. section 360bbb-3(b)(1), unless the authorization is terminated or revoked.  Performed at [facility], [place] [address] [place], [place] [address]   SARS CORONAVIRUS 2 (TAT 6-24 HRS) Nasopharyngeal Nasopharyngeal Swab     Status: None   Collection Time: 11/30/20  2:10 AM   Specimen: Nasopharyngeal Swab  Result Value Ref Range Status   SARS  Coronavirus 2 NEGATIVE NEGATIVE Final    Comment: (NOTE) SARS-CoV-2 target nucleic acids are NOT DETECTED.  The SARS-CoV-2 RNA is generally detectable in upper and lower respiratory specimens during the acute phase of infection. Negative results do not preclude SARS-CoV-2 infection, do not rule out co-infections with other pathogens, and should not be used as the sole basis for treatment or other patient management decisions. Negative results must be combined with clinical observations, patient history, and epidemiological information. The expected result is Negative.  Fact Sheet for Patients: SugarRoll.be  Fact Sheet for Healthcare Providers: https://www.woods-mathews.com/  This test is not yet approved or cleared by the [place] FDA and  has been authorized for detection and/or diagnosis of SARS-CoV-2 by FDA under an Emergency Use Authorization (EUA). This EUA will remain  in effect (meaning this test can be used) for the duration of the COVID-19 declaration under Se ction 564(b)(1) of the Act, 21 U.S.C. section 360bbb-3(b)(1), unless the authorization is terminated or revoked sooner.  Performed at [facility], [place] [address], [place], [place] [address]   Resp Panel by RT-PCR (Flu A&B, Covid) Nasopharyngeal Swab     Status: None   Collection Time: 12/05/20  5:49 PM   Specimen: Nasopharyngeal Swab; Nasopharyngeal(NP) swabs in vial transport medium  Result Value Ref Range Status   SARS Coronavirus 2 by RT PCR NEGATIVE NEGATIVE Final    Comment: (NOTE) SARS-CoV-2 target nucleic acids are NOT DETECTED.  The SARS-CoV-2 RNA is generally detectable in upper respiratory specimens during the acute phase of infection. The lowest concentration of SARS-CoV-2 viral copies this assay can detect is 138 copies/mL. A negative result does not preclude SARS-Cov-2 infection and should not be used as the sole basis for treatment or other  patient management decisions. A negative result may occur with  improper specimen collection/handling, submission of specimen other than nasopharyngeal swab, presence of viral mutation(s) within the areas targeted by this assay, and inadequate number of viral copies(<138 copies/mL). A negative result must be combined with clinical observations, patient history, and epidemiological information. The expected result is Negative.  Fact Sheet for Patients:  EntrepreneurPulse.com.au  Fact Sheet for Healthcare Providers:  IncredibleEmployment.be  This test is no t yet approved or cleared by the [place] FDA and  has been authorized for detection and/or diagnosis of SARS-CoV-2 by FDA under an Emergency Use Authorization (EUA). This EUA will remain  in effect (meaning this test can be used) for the duration of the COVID-19 declaration under Section 564(b)(1) of the Act, 21 U.S.C.section 360bbb-3(b)(1), unless the authorization is terminated  or revoked sooner.       Influenza A by PCR NEGATIVE NEGATIVE Final   Influenza B by PCR NEGATIVE NEGATIVE Final    Comment: (  NOTE) The Xpert Xpress SARS-CoV-2/FLU/RSV plus assay is intended as an aid in the diagnosis of influenza from Nasopharyngeal swab specimens and should not be used as a sole basis for treatment. Nasal washings and aspirates are unacceptable for Xpert Xpress SARS-CoV-2/FLU/RSV testing.  Fact Sheet for Patients: EntrepreneurPulse.com.au  Fact Sheet for Healthcare Providers: IncredibleEmployment.be  This test is not yet approved or cleared by the [place] FDA and has been authorized for detection and/or diagnosis of SARS-CoV-2 by FDA under an Emergency Use Authorization (EUA). This EUA will remain in effect (meaning this test can be used) for the duration of the COVID-19 declaration under Section 564(b)(1) of the Act, 21 U.S.C. section  360bbb-3(b)(1), unless the authorization is terminated or revoked.  Performed at [facility], [place] [address], [place], [place] [address]          Radiology Studies: EEG adult  Result Date: Dec 28, 2020 Lora Havens, MD     12/28/20  6:29 PM Patient Name: MICHAELENE DUTAN MRN: [id number] Epilepsy Attending: Lora Havens Referring Physician/Provider: Anibal Henderson, NP Date: 2020-12-28 Duration: 23.49 mins Patient history: 87 y.o. female who presented to the ED for evaluation of acute onset confusion, left-sided neglect, and right gaze palsy. EEG to evaluate for seizure. Level of alertness: Awake AEDs during EEG study: None Technical aspects: This EEG study was done with scalp electrodes positioned according to the 10-20 International system of electrode placement. Electrical activity was acquired at a sampling rate of 500Hz  and reviewed with a high frequency filter of 70Hz  and a low frequency filter of 1Hz . EEG data were recorded continuously and digitally stored. Description: No posterior dominant rhythm was seen. EEG showed continuous generalized polymorphic mixed frequencies with predominantly 5 to 6 Hz theta as well as intermittent 2-3hz  delta slowing admixed with 9-10hz  generalized alpha activity. Hyperventilation and photic stimulation were not performed.   ABNORMALITY - Continuous slow, generalized IMPRESSION: This study is suggestive of moderate diffuse encephalopathy, nonspecific etiology. No seizures or epileptiform discharges were seen throughout the recording. Lora Havens   CT HEAD CODE STROKE WO CONTRAST  Result Date: December 28, 2020 CLINICAL DATA:  Code stroke. EXAM: CT HEAD WITHOUT CONTRAST TECHNIQUE: Contiguous axial images were obtained from the base of the skull through the vertex without intravenous contrast. COMPARISON:  11/03/2020 FINDINGS: Brain: There is no acute intracranial hemorrhage, mass effect, or edema. No new loss of gray-white differentiation. Ventricles and  sulci are stable in size and configuration. Stable findings of probable chronic microvascular ischemic changes in the cerebral white matter. No extra-axial collection. Vascular: No hyperdense vessel. Intracranial atherosclerotic calcifications at the skull base. Skull: Unremarkable. Sinuses/Orbits: No acute abnormality. Other: Mastoid air cells are clear. ASPECTS ([place] Stroke Program Early CT Score) - Ganglionic level infarction (caudate, lentiform nuclei, internal capsule, insula, M1-M3 cortex): 7 - Supraganglionic infarction (M4-M6 cortex): 3 Total score (0-10 with 10 being normal): 10 IMPRESSION: There is no acute intracranial hemorrhage or evidence of acute infarction. ASPECT score is 10. No change since prior study. These results were communicated to Dr. Leonie Man at 1:39 pm on 2020-12-28 by text page via the [facility] messaging system. Electronically Signed   By: Macy Mis M.D.   On: 2020/12/28 13:42   CT ANGIO HEAD NECK W WO CM (CODE STROKE)  Result Date: 2020/12/28 CLINICAL DATA:  Code stroke presentation. EXAM: CT ANGIOGRAPHY HEAD AND NECK TECHNIQUE: Multidetector CT imaging of the head and neck was performed using the standard protocol during bolus administration of intravenous contrast. Multiplanar  CT image reconstructions and MIPs were obtained to evaluate the vascular anatomy. Carotid stenosis measurements (when applicable) are obtained utilizing NASCET criteria, using the distal internal carotid diameter as the denominator. CONTRAST:  9mL OMNIPAQUE IOHEXOL 350 MG/ML SOLN COMPARISON:  MRI study 08/16/2011.  Head CT earlier same day. FINDINGS: CTA NECK FINDINGS Aortic arch: Aortic atherosclerosis. Branching pattern is normal. Innominate artery origin is not included. Right carotid system: Common carotid artery widely patent to the bifurcation. Soft and calcified plaque at the carotid bifurcation but no stenosis. Cervical ICA is tortuous but widely patent to the skull base. Left carotid system: Common  carotid artery widely patent to the bifurcation. Calcified plaque at the carotid bifurcation and ICA bulb but no stenosis. Cervical ICA is tortuous but widely patent to the skull base. Vertebral arteries: Proximal subclavian arteries are widely patent. Both vertebral artery origins are widely patent. The left vertebral artery is dominant. Both vertebral arteries appear normal through the cervical region to the foramen magnum. Skeleton: Ordinary degenerative cervical spondylosis. Other neck: No mass or lymphadenopathy. Upper chest: Normal Review of the MIP images confirms the above findings CTA HEAD FINDINGS Anterior circulation: Both internal carotid arteries are patent through the skull base and siphon regions. There is siphon atherosclerotic calcification but no stenosis greater than 30%. The anterior and middle cerebral vessels are patent without proximal stenosis, aneurysm or vascular malformation. No large or medium vessel occlusion is identified. Posterior circulation: Both vertebral arteries are patent through the foramen magnum to the basilar. No basilar stenosis. 30% stenosis of the left vertebral artery V4 segment. Posterior circulation branch vessels appear patent. Venous sinuses: Patent and normal. Anatomic variants: None significant. Review of the MIP images confirms the above findings IMPRESSION: No intracranial large or medium vessel occlusion. Atherosclerotic change at the carotid bifurcations and carotid siphon regions. No stenosis at the carotid bifurcation regions. No siphon stenosis greater than 30%. Findings discussed during interpretation at approximately 1355 hours with Dr. Leonie Man. Electronically Signed   By: Nelson Chimes M.D.   On: 12/05/2020 14:01        Scheduled Meds:  levothyroxine  50 mcg Oral QAC breakfast   pantoprazole  40 mg Oral Daily   pravastatin  20 mg Oral QHS   Continuous Infusions:   LOS: 1 day    Time spent: 55min    Domenic Polite, MD [facility]   12/06/2020, 11:03 AM

## 2020-12-06 NOTE — Progress Notes (Signed)
Stroke Team Progress Note  SUBJECTIVE Patient is sitting up comfortably in bedside chair.  Her nephew is at the bedside.  He states that patient is at her baseline.  She has slight confusion and memory loss more so in the last several weeks after she had a fall and had to move to a nursing home to recover.  CT scan of the head showed no acute abnormality and CT angiogram shows no large vessel stenosis or occlusion. Vitamin B12, TSH and RPR were all normal EEG shows moderate diffuse encephalopathy which is nonspecific but no definite epileptiform activity. OBJECTIVE Most recent Vital Signs: Temp: 97.6 F (36.4 C) (07/07 0438) Temp Source: Oral (07/07 0438) BP: 147/55 (07/07 0438) Pulse Rate: 57 (07/07 0438) Respiratory Rate: 17 O2 Saturdation: 100%  CBG (last 3)  Recent Labs    12/05/20 1403  GLUCAP 108*       Studies:  CT head no acute intracranial process. MRI not done as per family request CTA brain and neck no large vessel stenosis or occlusion.  Mild atherosclerotic change at both carotid bifurcations with less than 30% stenosis. ECHO on 11/05/2020 shows EF of 55 to 60%. LDL not done HbA1c not done EEG moderate diffuse slowing consistent with encephalopathy.  No seizure activity  Physical Exam:    Frail appearing patient, laying on stretcher, in no acute distress Head: Normocephalic and atraumatic, without obvious abnormality EENT: No OP obstruction, edentulous, dry mucous membranes LUNGS: Normal respiratory effort. Non-labored breathing on room air CV: Regular rate on telemetry, no pedal edema noted ABDOMEN: Soft, non-tender, non-distended Ext: warm, without obvious deformity    NEURO: Mental Status: Awake and alert. She does not answer orientation questions correctly. She states incorrectly that the month is November and when asked her age she states "old".  Speech/Language: speech is dysarthric, possibly 2/2 edentulous state.   Does not name objects, does not repeat  phrases, comprehension is not intact. Some left-sided neglect noted initially with improvement on further assessments. Intermittently follows simple commands She has diminished attention, registration and recall.  Poor insight into condition. Cranial Nerves: II: PERRL 4 mm/brisk. Will attend to visual stimuli in each lateral visual field intermittently. Intermittently neglects the left visual field. III, IV, VI: Right partial gaze palsy noted, she will cross midline with repeat stimulation in left visual field V: Sensation is intact to light touch and symmetrical to face. Does not blink to threat on the left visual field.  VII: Face is symmetric resting and smiling. VIII: Hearing is intact to loud voice, she is hard of hearing  IX, X: Palate elevation is symmetric. Phonation normal. XI: Head appears midline, patient does not shrug shoulders to command XII: Tongue protrudes midline Motor: Bilateral upper extremities with antigravity movement without pronator drift. Left lower extremity with some antigravity movement but with immediate drift to bed on initial assessment.  And does complain of pain in her hip. Right lower extremity with antigravity movement with minimal vertical drift on assessment.  Tone is normal. Bulk is decreased. Sensation: Grimaces and yells "ouch" to light application of noxious stimuli throughout Coordination: Unable to assess, patient does not follow complex commands DTRs: 2+ and symmetric biceps and brachioradialis Gait: Deferred   Impression: Acute encephalopathy- etiology unclear-small right hemispheric stroke versus seizure with postictal state versus encephalopathy due to toxic metabolic etiology.  Patient appears to have improved and is back to her baseline.  Lab work for reversible causes of memory loss is negative brain imaging is negative for  large stroke  TREATMENT/PLAN Recommend check UA and chest x-ray if for infection and continue ongoing therapies and  transfer back to skilled nursing facility for irritation.  No further neurological work-up is necessary.  Long discussion with patient and nephew at the bedside and answered questions.  Discussed with Dr. Broadus John  I spent 35 minutes in total face-to-face time with the patient, more than 50% of which was spent in counseling and coordination of care, reviewing test results, reviewing medication and discussing or reviewing the diagnosis of encephalopathy, confusion, cognitive impairment and TIA   , the prognosis and treatment options.   Antony Contras, MD Medical Director [place] Pager: [phone] 12/06/2020 3:07 PM

## 2020-12-06 NOTE — NC FL2 (Signed)
[place] LEVEL OF CARE SCREENING TOOL     IDENTIFICATION  Patient Name: Natalie Bullock Birthdate: [birth year] Sex: female Admission Date (Current Location): 12/05/2020  [facility] and [place] Number:  Herbalist and Address:  [facility] [place], [place] [address] [place], [place] [address]      Provider Number: [id number]  Attending Physician Name and Address:  Domenic Polite, MD  Relative Name and Phone Number:  [name] [facility])   [phone] [facility])    Current Level of Care: Hospital Recommended Level of Care: [place] Prior Approval Number:    Date Approved/Denied:   PASRR Number: [id number]  Discharge Plan: SNF    Current Diagnoses: Patient Active Problem List   Diagnosis Date Noted   Acute metabolic encephalopathy 32/20/2542   Memory loss    Goals of care, counseling/discussion    Palliative care by specialist    GI bleed 11/27/2020   Encephalopathy 11/03/2020   Fall    Orthostasis    Mitral regurgitation 03/07/2014   Near syncope 03/26/2013   Syncope 05/19/2012   Delirium 09/12/2011   HTN (hypertension) 08/17/2011   HX: breast cancer 08/14/2011   Hypothyroidism 08/14/2011   Hx of CABG 11/19/2010   Hyperlipidemia 11/19/2010    Orientation RESPIRATION BLADDER Height & Weight     Self, Time, Place  Normal Incontinent, External catheter Weight: 121 lb 0.5 oz (54.9 kg) Height:     BEHAVIORAL SYMPTOMS/MOOD NEUROLOGICAL BOWEL NUTRITION STATUS      Incontinent Diet (See d/c summary)  AMBULATORY STATUS COMMUNICATION OF NEEDS Skin   Extensive Assist Verbally Normal                       Personal Care Assistance Level of Assistance  Bathing, Feeding, Dressing Bathing Assistance: Maximum assistance Feeding assistance: Independent Dressing Assistance: Maximum assistance     Functional Limitations Info  Sight, Hearing, Speech Sight Info: Adequate Hearing Info: Impaired Speech Info: Adequate     SPECIAL CARE FACTORS FREQUENCY  PT (By licensed PT), OT (By licensed OT)     PT Frequency: 5x/week OT Frequency: 5x/week            Contractures Contractures Info: Not present    Additional Factors Info  Code Status, Allergies Code Status Info: DNR Allergies Info: See d/c summary           Current Medications (12/06/2020):  This is the current hospital active medication list Current Facility-Administered Medications  Medication Dose Route Frequency Provider Last Rate Last Admin   acetaminophen (TYLENOL) tablet 650 mg  650 mg Oral Q6H PRN Karmen Bongo, MD   650 mg at 12/06/20 7062   Or   acetaminophen (TYLENOL) suppository 650 mg  650 mg Rectal Q6H PRN Karmen Bongo, MD       antiseptic oral rinse (BIOTENE) solution 15 mL  15 mL Topical PRN Karmen Bongo, MD       diphenhydrAMINE (BENADRYL) injection 12.5 mg  12.5 mg Intravenous Q4H PRN Karmen Bongo, MD       glycopyrrolate (ROBINUL) tablet 1 mg  1 mg Oral Q4H PRN Karmen Bongo, MD       Or   glycopyrrolate (ROBINUL) injection 0.2 mg  0.2 mg Subcutaneous Q4H PRN Karmen Bongo, MD       Or   glycopyrrolate (ROBINUL) injection 0.2 mg  0.2 mg Intravenous Q4H PRN Karmen Bongo, MD       haloperidol (HALDOL) tablet 0.5 mg  0.5 mg Oral Q4H PRN Karmen Bongo, MD       Or   haloperidol (HALDOL) 2 MG/ML solution 0.5 mg  0.5 mg Sublingual Q4H PRN Karmen Bongo, MD       Or   haloperidol lactate (HALDOL) injection 0.5 mg  0.5 mg Intravenous Q4H PRN Karmen Bongo, MD   0.5 mg at 12/06/20 0038   hydrocortisone (ANUSOL-HC) suppository 25 mg  25 mg Rectal BID PRN Karmen Bongo, MD       levothyroxine (SYNTHROID) tablet 50 mcg  50 mcg Oral QAC breakfast Karmen Bongo, MD   50 mcg at 12/06/20 0450   magnesium hydroxide (MILK OF MAGNESIA) suspension 30 mL  30 mL Oral Daily PRN Karmen Bongo, MD       morphine CONCENTRATE 10 MG/0.5ML oral solution 5 mg  5 mg Oral Q2H PRN Karmen Bongo, MD       Or   morphine  CONCENTRATE 10 MG/0.5ML oral solution 5 mg  5 mg Sublingual Q2H PRN Karmen Bongo, MD       ondansetron (ZOFRAN-ODT) disintegrating tablet 4 mg  4 mg Oral Q6H PRN Karmen Bongo, MD       Or   ondansetron [facility]) injection 4 mg  4 mg Intravenous Q6H PRN Karmen Bongo, MD       pantoprazole (PROTONIX) EC tablet 40 mg  40 mg Oral Daily Karmen Bongo, MD   40 mg at 12/06/20 7035   polyvinyl alcohol (LIQUIFILM TEARS) 1.4 % ophthalmic solution 1 drop  1 drop Both Eyes QID PRN Karmen Bongo, MD       pravastatin (PRAVACHOL) tablet 20 mg  20 mg Oral Ivery Quale, MD   20 mg at 12/05/20 2218   senna-docusate (Senokot-S) tablet 1 tablet  1 tablet Oral QHS PRN Karmen Bongo, MD         Discharge Medications: Please see discharge summary for a list of discharge medications.  Relevant Imaging Results:  Relevant Lab Results:   Additional Information SS#: [id number]  Bethann Berkshire, LCSW

## 2020-12-07 NOTE — Consult Note (Signed)
   [facility] Inpatient Consult   12/07/2020  NATORI GUDINO 1925-02-23 [id number]  [place] [facility] [ACO] Patient: Medicare CMS DCE  Primary Care Provider:  Seward Carol, MD is listed at [place]   Patient screened for  less than 7 days readmission hospitalization and to assess risk for unplanned readmission.  Reviewed also to assess for potential [place] [facility] service needs for post hospital transition.  Review of patient's medical record reveals patient is recently from [facility] skilled nursing facility and assessed for TIA verse Stroke and to return to facility when medically stable.  Reviewed therapy and Transition of Care [TOC] team notes for disposition and needs and previous palliative consult notes for post rehab SNF to palliative care follow up to assist with long term goals and needs.   Plan:  Will alert [facility] PAC RN of transition to a skilled nursing facility currently for rehab with palliative following to assist with post facility plans, if needed, with POA [nephew].  For questions contact:   Natividad Brood, RN BSN [facility] Liaison  [phone] business mobile phone Toll free office [phone]  Fax number: [phone] [email or web address]

## 2020-12-07 NOTE — Progress Notes (Signed)
Patient discharging back to [facility]. IV out. Report called to [facility], Therapist, sports.  Era Bumpers, RN

## 2020-12-07 NOTE — Progress Notes (Signed)
[facility] Room [phone] [facility]) Hospital Liaison note:  Notified by Walden Field, NP of request for [place] services. Will continue to follow for disposition.  Please call with any outpatient palliative questions or concerns.  Thank you for the opportunity to participate in this patient's care.  Thank you, Lorelee Market, LPN [facility] Liaison [phone]

## 2020-12-07 NOTE — TOC Transition Note (Signed)
Transition of Care [facility]) - CM/SW Discharge Note   Patient Details  Name: BETZY BARBIER MRN: [id number] Date of Birth: 12/27/1924  Transition [facility]) CM/SW Contact:  Coralee Pesa, [place] Phone Number: 12/07/2020, 11:40 AM   Clinical Narrative:    Pt to be transported via [facility] to [facility]. Nurse to call report to [phone].   Final next level of care: Skilled Nursing Facility Barriers to Discharge: Barriers Resolved   Patient Goals and CMS Choice        Discharge Placement              Patient chooses bed at: [facility] Patient to be transferred to facility by: [place] Name of family member notified: [name] Patient and family notified of of transfer: 12/07/20  Discharge Plan and Services                                     Social Determinants of Health (SDOH) Interventions     Readmission Risk Interventions No flowsheet data found.

## 2020-12-07 NOTE — Discharge Summary (Addendum)
Physician Discharge Summary  Natalie Bullock GXQ:[id number] DOB: 1924-06-30 DOA: 12/05/2020  PCP: Seward Carol, MD  Admit date: 12/05/2020 Discharge date: 12/07/2020  Time spent: 35 minutes  Recommendations for Outpatient Follow-up:  Discharge back to SNF for rehabilitation Palliative care follow-up needed at SNF and at the time of discharge back home  Discharge Diagnoses:  Principal Problem:   Acute metabolic encephalopathy Dementia CAD/CABG   Hyperlipidemia   Hypothyroidism   HTN (hypertension)   Goals of care, counseling/discussion   Discharge Condition: Stable  Diet recommendation: Heart healthy  Filed Weights   12/06/20 0438  Weight: 54.9 kg    History of present illness:   87 year old female history of CAD/CABG, hypertension, paroxysmal atrial fibrillation not on anticoagulation, vitamin D deficiency, breast cancer, vascular dementia presented to the ED from SNF for evaluation of acute onset of confusion and left-sided neglect and right gaze preference.  She had been living independently and ambulating with a walker until about 3 to 4 weeks ago.  Was hospitalized with lower GI bleed in 6/28, treated conservatively and discharged to SNF for rehabilitation.  On 7/6 the staff noted that she was acutely confused with left-sided neglect and right gaze preference and subsequently sent to the ED.  Hospital Course:   Acute encephalopathy -Improved and back to baseline -Concern for TIA/CVA based on right gaze palsy and left sided neglect on admission which has resolved, other possibility is delirium from dementia -PT OT eval completed, SNF recommended for rehabilitation, SLP eval completed passed swallow screen -EEG negative for seizures -MRI brain was not pursued due to family's concern that she would not be able to lay still and tolerate this and plan for conservative comfort focused management only -Seen by palliative care as well -Plan for symptom focused care, DNR with  palliative care follow-up at [place] [facility]   Hypertension -Resume metoprolol    Dementia -Delirium precautions   CAD/CABG -Resume metoprolol, Pravachol   Hypothyroidism -Continue Synthroid    Discharge Exam: Vitals:   12/06/20 0438 12/07/20 0446  BP: (!) 147/55 (!) 133/54  Pulse: (!) 57 67  Resp: 17 16  Temp: 97.6 F (36.4 C) 98 F (36.7 C)  SpO2: 100% 100%    General: Awake alert oriented x2, cognitive and memory deficits Cardiovascular: S1-S2, regular rate rhythm Respiratory: Clear  Discharge Instructions   Discharge Instructions     Diet - low sodium heart healthy   Complete by: As directed    Increase activity slowly   Complete by: As directed       Allergies as of 12/07/2020       Reactions   Valium Shortness Of Breath, Other (See Comments)   "ALLERGIC," per [facility]   Codeine Other (See Comments)   "ALLERGIC," per [facility]   Crestor [rosuvastatin Calcium] Other (See Comments)   "ALLERGIC," per [facility]   Doxycycline Other (See Comments)   "ALLERGIC," per [facility]   Ezetimibe-simvastatin Other (See Comments)   Vytorin- "ALLERGIC," per [facility]   Lipitor [atorvastatin Calcium] Other (See Comments)   "ALLERGIC," per [facility]   Sulfa Drugs Cross Reactors Other (See Comments)   "ALLERGIC," per [facility]   Tequin Other (See Comments)   "ALLERGIC," per [facility]   Zocor [simvastatin] Other (See Comments)   "ALLERGIC," per [facility]        Medication List     STOP taking these medications    C 500 500 MG Chew Generic drug: Ascorbic Acid       TAKE these medications  acetaminophen 325 MG tablet Commonly known as: TYLENOL Take 650 mg by mouth 3 (three) times daily.   furosemide 20 MG tablet Commonly known as: LASIX Take 20 mg by mouth daily.   levothyroxine 50 MCG tablet Commonly known as: SYNTHROID Take 50 mcg by mouth daily before breakfast.   metoprolol tartrate 25 MG tablet Commonly known as: LOPRESSOR Take 1 tablet (25 mg total) by mouth 2 (two) times daily.    pravastatin 20 MG tablet Commonly known as: PRAVACHOL Take 1 tablet (20 mg total) by mouth at bedtime.   SYSTANE OP Place 2 drops into both eyes every morning.       Allergies  Allergen Reactions   Valium Shortness Of Breath and Other (See Comments)    "ALLERGIC," per [facility]   Codeine Other (See Comments)    "ALLERGIC," per [facility]   Crestor [Rosuvastatin Calcium] Other (See Comments)    "ALLERGIC," per [facility]   Doxycycline Other (See Comments)    "ALLERGIC," per [facility]   Ezetimibe-Simvastatin Other (See Comments)    Vytorin- "ALLERGIC," per [facility]   Lipitor [Atorvastatin Calcium] Other (See Comments)    "ALLERGIC," per [facility]   Sulfa Drugs Cross Reactors Other (See Comments)    "ALLERGIC," per [facility]   Tequin Other (See Comments)    "ALLERGIC," per [facility]   Zocor [Simvastatin] Other (See Comments)    "ALLERGIC," per [facility]    Follow-up Information     Polite, Jori Moll, MD. Schedule an appointment as soon as possible for a visit in 1 week(s).   Specialty: Internal Medicine Contact information: [address]  [place] [address] [phone]                  The results of significant diagnostics from this hospitalization (including imaging, microbiology, ancillary and laboratory) are listed below for reference.    Significant Diagnostic Studies: EEG adult  Result Date: 12/10/20 Lora Havens, MD     12/10/20  6:29 PM Patient Name: Natalie Bullock MRN: [id number] Epilepsy Attending: Lora Havens Referring Physician/Provider: Anibal Henderson, NP Date: 12-10-20 Duration: 23.49 mins Patient history: 87 y.o. female who presented to the ED for evaluation of acute onset confusion, left-sided neglect, and right gaze palsy. EEG to evaluate for seizure. Level of alertness: Awake AEDs during EEG study: None Technical aspects: This EEG study was done with scalp electrodes positioned according to the 10-20 International system of electrode placement. Electrical activity was  acquired at a sampling rate of 500Hz  and reviewed with a high frequency filter of 70Hz  and a low frequency filter of 1Hz . EEG data were recorded continuously and digitally stored. Description: No posterior dominant rhythm was seen. EEG showed continuous generalized polymorphic mixed frequencies with predominantly 5 to 6 Hz theta as well as intermittent 2-3hz  delta slowing admixed with 9-10hz  generalized alpha activity. Hyperventilation and photic stimulation were not performed.   ABNORMALITY - Continuous slow, generalized IMPRESSION: This study is suggestive of moderate diffuse encephalopathy, nonspecific etiology. No seizures or epileptiform discharges were seen throughout the recording. Lora Havens   CT HEAD CODE STROKE WO CONTRAST  Result Date: 12/10/2020 CLINICAL DATA:  Code stroke. EXAM: CT HEAD WITHOUT CONTRAST TECHNIQUE: Contiguous axial images were obtained from the base of the skull through the vertex without intravenous contrast. COMPARISON:  11/03/2020 FINDINGS: Brain: There is no acute intracranial hemorrhage, mass effect, or edema. No new loss of gray-white differentiation. Ventricles and sulci are stable in size and configuration. Stable findings of probable chronic  microvascular ischemic changes in the cerebral white matter. No extra-axial collection. Vascular: No hyperdense vessel. Intracranial atherosclerotic calcifications at the skull base. Skull: Unremarkable. Sinuses/Orbits: No acute abnormality. Other: Mastoid air cells are clear. ASPECTS ([place] Stroke Program Early CT Score) - Ganglionic level infarction (caudate, lentiform nuclei, internal capsule, insula, M1-M3 cortex): 7 - Supraganglionic infarction (M4-M6 cortex): 3 Total score (0-10 with 10 being normal): 10 IMPRESSION: There is no acute intracranial hemorrhage or evidence of acute infarction. ASPECT score is 10. No change since prior study. These results were communicated to Dr. Leonie Man at 1:39 pm on 12/05/2020 by text page via the  [facility] messaging system. Electronically Signed   By: Macy Mis M.D.   On: 12/05/2020 13:42   CT ANGIO HEAD NECK W WO CM (CODE STROKE)  Result Date: 12/05/2020 CLINICAL DATA:  Code stroke presentation. EXAM: CT ANGIOGRAPHY HEAD AND NECK TECHNIQUE: Multidetector CT imaging of the head and neck was performed using the standard protocol during bolus administration of intravenous contrast. Multiplanar CT image reconstructions and MIPs were obtained to evaluate the vascular anatomy. Carotid stenosis measurements (when applicable) are obtained utilizing NASCET criteria, using the distal internal carotid diameter as the denominator. CONTRAST:  37mL OMNIPAQUE IOHEXOL 350 MG/ML SOLN COMPARISON:  MRI study 08/16/2011.  Head CT earlier same day. FINDINGS: CTA NECK FINDINGS Aortic arch: Aortic atherosclerosis. Branching pattern is normal. Innominate artery origin is not included. Right carotid system: Common carotid artery widely patent to the bifurcation. Soft and calcified plaque at the carotid bifurcation but no stenosis. Cervical ICA is tortuous but widely patent to the skull base. Left carotid system: Common carotid artery widely patent to the bifurcation. Calcified plaque at the carotid bifurcation and ICA bulb but no stenosis. Cervical ICA is tortuous but widely patent to the skull base. Vertebral arteries: Proximal subclavian arteries are widely patent. Both vertebral artery origins are widely patent. The left vertebral artery is dominant. Both vertebral arteries appear normal through the cervical region to the foramen magnum. Skeleton: Ordinary degenerative cervical spondylosis. Other neck: No mass or lymphadenopathy. Upper chest: Normal Review of the MIP images confirms the above findings CTA HEAD FINDINGS Anterior circulation: Both internal carotid arteries are patent through the skull base and siphon regions. There is siphon atherosclerotic calcification but no stenosis greater than 30%. The anterior and  middle cerebral vessels are patent without proximal stenosis, aneurysm or vascular malformation. No large or medium vessel occlusion is identified. Posterior circulation: Both vertebral arteries are patent through the foramen magnum to the basilar. No basilar stenosis. 30% stenosis of the left vertebral artery V4 segment. Posterior circulation branch vessels appear patent. Venous sinuses: Patent and normal. Anatomic variants: None significant. Review of the MIP images confirms the above findings IMPRESSION: No intracranial large or medium vessel occlusion. Atherosclerotic change at the carotid bifurcations and carotid siphon regions. No stenosis at the carotid bifurcation regions. No siphon stenosis greater than 30%. Findings discussed during interpretation at approximately 1355 hours with Dr. Leonie Man. Electronically Signed   By: Nelson Chimes M.D.   On: 12/05/2020 14:01    Microbiology: Recent Results (from the past 240 hour(s))  Resp Panel by RT-PCR (Flu A&B, Covid) Nasopharyngeal Swab     Status: None   Collection Time: 11/27/20  2:10 PM   Specimen: Nasopharyngeal Swab; Nasopharyngeal(NP) swabs in vial transport medium  Result Value Ref Range Status   SARS Coronavirus 2 by RT PCR NEGATIVE NEGATIVE Final    Comment: (NOTE) SARS-CoV-2 target nucleic acids are NOT DETECTED.  The SARS-CoV-2 RNA is generally detectable in upper respiratory specimens during the acute phase of infection. The lowest concentration of SARS-CoV-2 viral copies this assay can detect is 138 copies/mL. A negative result does not preclude SARS-Cov-2 infection and should not be used as the sole basis for treatment or other patient management decisions. A negative result may occur with  improper specimen collection/handling, submission of specimen other than nasopharyngeal swab, presence of viral mutation(s) within the areas targeted by this assay, and inadequate number of viral copies(<138 copies/mL). A negative result must be  combined with clinical observations, patient history, and epidemiological information. The expected result is Negative.  Fact Sheet for Patients:  EntrepreneurPulse.com.au  Fact Sheet for Healthcare Providers:  IncredibleEmployment.be  This test is no t yet approved or cleared by the [place] FDA and  has been authorized for detection and/or diagnosis of SARS-CoV-2 by FDA under an Emergency Use Authorization (EUA). This EUA will remain  in effect (meaning this test can be used) for the duration of the COVID-19 declaration under Section 564(b)(1) of the Act, 21 U.S.C.section 360bbb-3(b)(1), unless the authorization is terminated  or revoked sooner.       Influenza A by PCR NEGATIVE NEGATIVE Final   Influenza B by PCR NEGATIVE NEGATIVE Final    Comment: (NOTE) The Xpert Xpress SARS-CoV-2/FLU/RSV plus assay is intended as an aid in the diagnosis of influenza from Nasopharyngeal swab specimens and should not be used as a sole basis for treatment. Nasal washings and aspirates are unacceptable for Xpert Xpress SARS-CoV-2/FLU/RSV testing.  Fact Sheet for Patients: EntrepreneurPulse.com.au  Fact Sheet for Healthcare Providers: IncredibleEmployment.be  This test is not yet approved or cleared by the [place] FDA and has been authorized for detection and/or diagnosis of SARS-CoV-2 by FDA under an Emergency Use Authorization (EUA). This EUA will remain in effect (meaning this test can be used) for the duration of the COVID-19 declaration under Section 564(b)(1) of the Act, 21 U.S.C. section 360bbb-3(b)(1), unless the authorization is terminated or revoked.  Performed at [facility], [place] [address], [place], [place] [address]   SARS CORONAVIRUS 2 (TAT 6-24 HRS) Nasopharyngeal Nasopharyngeal Swab     Status: None   Collection Time: 11/30/20  2:10 AM   Specimen: Nasopharyngeal Swab  Result Value  Ref Range Status   SARS Coronavirus 2 NEGATIVE NEGATIVE Final    Comment: (NOTE) SARS-CoV-2 target nucleic acids are NOT DETECTED.  The SARS-CoV-2 RNA is generally detectable in upper and lower respiratory specimens during the acute phase of infection. Negative results do not preclude SARS-CoV-2 infection, do not rule out co-infections with other pathogens, and should not be used as the sole basis for treatment or other patient management decisions. Negative results must be combined with clinical observations, patient history, and epidemiological information. The expected result is Negative.  Fact Sheet for Patients: SugarRoll.be  Fact Sheet for Healthcare Providers: https://www.woods-mathews.com/  This test is not yet approved or cleared by the [place] FDA and  has been authorized for detection and/or diagnosis of SARS-CoV-2 by FDA under an Emergency Use Authorization (EUA). This EUA will remain  in effect (meaning this test can be used) for the duration of the COVID-19 declaration under Se ction 564(b)(1) of the Act, 21 U.S.C. section 360bbb-3(b)(1), unless the authorization is terminated or revoked sooner.  Performed at [facility], [place] [address], [place], [place] [address]   Resp Panel by RT-PCR (Flu A&B, Covid) Nasopharyngeal Swab     Status:  None   Collection Time: 12/05/20  5:49 PM   Specimen: Nasopharyngeal Swab; Nasopharyngeal(NP) swabs in vial transport medium  Result Value Ref Range Status   SARS Coronavirus 2 by RT PCR NEGATIVE NEGATIVE Final    Comment: (NOTE) SARS-CoV-2 target nucleic acids are NOT DETECTED.  The SARS-CoV-2 RNA is generally detectable in upper respiratory specimens during the acute phase of infection. The lowest concentration of SARS-CoV-2 viral copies this assay can detect is 138 copies/mL. A negative result does not preclude SARS-Cov-2 infection and should not be used as the sole basis  for treatment or other patient management decisions. A negative result may occur with  improper specimen collection/handling, submission of specimen other than nasopharyngeal swab, presence of viral mutation(s) within the areas targeted by this assay, and inadequate number of viral copies(<138 copies/mL). A negative result must be combined with clinical observations, patient history, and epidemiological information. The expected result is Negative.  Fact Sheet for Patients:  EntrepreneurPulse.com.au  Fact Sheet for Healthcare Providers:  IncredibleEmployment.be  This test is no t yet approved or cleared by the [place] FDA and  has been authorized for detection and/or diagnosis of SARS-CoV-2 by FDA under an Emergency Use Authorization (EUA). This EUA will remain  in effect (meaning this test can be used) for the duration of the COVID-19 declaration under Section 564(b)(1) of the Act, 21 U.S.C.section 360bbb-3(b)(1), unless the authorization is terminated  or revoked sooner.       Influenza A by PCR NEGATIVE NEGATIVE Final   Influenza B by PCR NEGATIVE NEGATIVE Final    Comment: (NOTE) The Xpert Xpress SARS-CoV-2/FLU/RSV plus assay is intended as an aid in the diagnosis of influenza from Nasopharyngeal swab specimens and should not be used as a sole basis for treatment. Nasal washings and aspirates are unacceptable for Xpert Xpress SARS-CoV-2/FLU/RSV testing.  Fact Sheet for Patients: EntrepreneurPulse.com.au  Fact Sheet for Healthcare Providers: IncredibleEmployment.be  This test is not yet approved or cleared by the [place] FDA and has been authorized for detection and/or diagnosis of SARS-CoV-2 by FDA under an Emergency Use Authorization (EUA). This EUA will remain in effect (meaning this test can be used) for the duration of the COVID-19 declaration under Section 564(b)(1) of the Act, 21  U.S.C. section 360bbb-3(b)(1), unless the authorization is terminated or revoked.  Performed at [facility], [place] [address], [place], [place] [address]      Labs: Basic Metabolic Panel: Recent Labs  Lab 12/05/20 1326 12/05/20 1334  NA 136 137  K 3.7 3.9  CL 106 107  CO2 21*  --   GLUCOSE 133* 130*  BUN 20 24*  CREATININE 0.94 0.90  CALCIUM 8.9  --    Liver Function Tests: Recent Labs  Lab 12/05/20 1326  AST 15  ALT 11  ALKPHOS 48  BILITOT 0.7  PROT 6.0*  ALBUMIN 2.7*   No results for input(s): LIPASE, AMYLASE in the last 168 hours. No results for input(s): AMMONIA in the last 168 hours. CBC: Recent Labs  Lab 12/05/20 1326 12/05/20 1334  WBC 7.4  --   NEUTROABS 5.9  --   HGB 9.7* 9.9*  HCT 30.6* 29.0*  MCV 87.2  --   PLT 274  --    Cardiac Enzymes: No results for input(s): CKTOTAL, CKMB, CKMBINDEX, TROPONINI in the last 168 hours. BNP: BNP (last 3 results) No results for input(s): BNP in the last 8760 hours.  ProBNP (last 3 results) No results for input(s): PROBNP in  the last 8760 hours.  CBG: Recent Labs  Lab 12/05/20 1403  GLUCAP 108*       Signed:  Domenic Polite MD.  [facility] 12/07/2020, 10:11 AM

## 2020-12-10 ENCOUNTER — Other Ambulatory Visit: Payer: Self-pay | Admitting: *Deleted

## 2020-12-10 DIAGNOSIS — I480 Paroxysmal atrial fibrillation: Secondary | ICD-10-CM | POA: Diagnosis not present

## 2020-12-10 DIAGNOSIS — R5381 Other malaise: Secondary | ICD-10-CM | POA: Diagnosis not present

## 2020-12-10 DIAGNOSIS — I2510 Atherosclerotic heart disease of native coronary artery without angina pectoris: Secondary | ICD-10-CM | POA: Diagnosis not present

## 2020-12-10 DIAGNOSIS — R52 Pain, unspecified: Secondary | ICD-10-CM | POA: Diagnosis not present

## 2020-12-10 NOTE — Patient Outreach (Signed)
Member screened for potential [facility] Care Management needs. [name] resides in [facility].   Communication sent to [facility] SW to inquire about anticipated transition plans.   Will continue to follow while member resides in SNF.    Natalie Rolling, MSN, RN,BSN [place] Acute Care Coordinator [phone] [facility]) [phone]  (Toll free office)

## 2020-12-13 ENCOUNTER — Other Ambulatory Visit: Payer: Self-pay | Admitting: *Deleted

## 2020-12-13 NOTE — Patient Outreach (Signed)
[facility] Post- Acute Care Coordinator follow up. [name] resides in [facility].   Update received from [place] indicating she is working on getting palliative care order. States nephew [name] is primary contact. Unsure of transition plan/date at this time.   Will continue to follow while member resides in SNF.   Marthenia Rolling, MSN, RN,BSN [place] Acute Care Coordinator [phone] [facility]) [phone]  (Toll free office)

## 2020-12-20 DIAGNOSIS — I480 Paroxysmal atrial fibrillation: Secondary | ICD-10-CM | POA: Diagnosis not present

## 2020-12-20 DIAGNOSIS — Z9181 History of falling: Secondary | ICD-10-CM | POA: Diagnosis not present

## 2020-12-20 DIAGNOSIS — R5381 Other malaise: Secondary | ICD-10-CM | POA: Diagnosis not present

## 2020-12-20 DIAGNOSIS — Z8719 Personal history of other diseases of the digestive system: Secondary | ICD-10-CM | POA: Diagnosis not present

## 2020-12-27 ENCOUNTER — Other Ambulatory Visit: Payer: Self-pay | Admitting: *Deleted

## 2020-12-27 NOTE — Patient Outreach (Signed)
Late entry for 12/26/20  Per [place] (Patient [place]) member resides in [facility].   Communication sent to facility SW to inquire about transition plans.   Will continue to follow while Mrs. Lenahan is in SNF.    Marthenia Rolling, MSN, RN,BSN [place] Acute Care Coordinator [phone] [facility]) [phone]  (Toll free office)

## 2020-12-31 ENCOUNTER — Other Ambulatory Visit: Payer: Self-pay | Admitting: *Deleted

## 2020-12-31 NOTE — Patient Outreach (Signed)
[facility] Post- Acute Care Coordinator follow up. Member screened for potential [facility] Care Management needs.   Update received from [place] indicating [name] transitioned to LTC at the facility.  No identifiable [facility] Care Management needs at this time.    Natalie Rolling, MSN, RN,BSN [place] Acute Care Coordinator [phone] [facility]) [phone]  (Toll free office)

## 2021-01-02 ENCOUNTER — Emergency Department (HOSPITAL_COMMUNITY): Payer: Medicare Other

## 2021-01-02 ENCOUNTER — Inpatient Hospital Stay (HOSPITAL_COMMUNITY)
Admission: EM | Admit: 2021-01-02 | Discharge: 2021-01-05 | DRG: 070 | Disposition: A | Discharge status: Skilled Nursing Facility | Payer: Medicare Other | Attending: Family Medicine | Admitting: Family Medicine

## 2021-01-02 DIAGNOSIS — I10 Essential (primary) hypertension: Secondary | ICD-10-CM | POA: Diagnosis not present

## 2021-01-02 DIAGNOSIS — R4182 Altered mental status, unspecified: Secondary | ICD-10-CM | POA: Diagnosis not present

## 2021-01-02 DIAGNOSIS — F0151 Vascular dementia with behavioral disturbance: Secondary | ICD-10-CM | POA: Diagnosis present

## 2021-01-02 DIAGNOSIS — Z820 Family history of epilepsy and other diseases of the nervous system: Secondary | ICD-10-CM

## 2021-01-02 DIAGNOSIS — G9341 Metabolic encephalopathy: Principal | ICD-10-CM | POA: Diagnosis present

## 2021-01-02 DIAGNOSIS — Z853 Personal history of malignant neoplasm of breast: Secondary | ICD-10-CM | POA: Diagnosis not present

## 2021-01-02 DIAGNOSIS — I252 Old myocardial infarction: Secondary | ICD-10-CM

## 2021-01-02 DIAGNOSIS — E039 Hypothyroidism, unspecified: Secondary | ICD-10-CM | POA: Diagnosis present

## 2021-01-02 DIAGNOSIS — D649 Anemia, unspecified: Secondary | ICD-10-CM | POA: Diagnosis present

## 2021-01-02 DIAGNOSIS — R569 Unspecified convulsions: Secondary | ICD-10-CM | POA: Diagnosis not present

## 2021-01-02 DIAGNOSIS — Z515 Encounter for palliative care: Secondary | ICD-10-CM | POA: Diagnosis not present

## 2021-01-02 DIAGNOSIS — Z9181 History of falling: Secondary | ICD-10-CM

## 2021-01-02 DIAGNOSIS — I2510 Atherosclerotic heart disease of native coronary artery without angina pectoris: Secondary | ICD-10-CM | POA: Diagnosis present

## 2021-01-02 DIAGNOSIS — I69328 Other speech and language deficits following cerebral infarction: Secondary | ICD-10-CM

## 2021-01-02 DIAGNOSIS — F0390 Unspecified dementia without behavioral disturbance: Secondary | ICD-10-CM | POA: Diagnosis not present

## 2021-01-02 DIAGNOSIS — E43 Unspecified severe protein-calorie malnutrition: Secondary | ICD-10-CM | POA: Diagnosis present

## 2021-01-02 DIAGNOSIS — I499 Cardiac arrhythmia, unspecified: Secondary | ICD-10-CM | POA: Diagnosis not present

## 2021-01-02 DIAGNOSIS — R414 Neurologic neglect syndrome: Secondary | ICD-10-CM | POA: Diagnosis present

## 2021-01-02 DIAGNOSIS — R29810 Facial weakness: Secondary | ICD-10-CM | POA: Diagnosis not present

## 2021-01-02 DIAGNOSIS — R4701 Aphasia: Secondary | ICD-10-CM | POA: Diagnosis present

## 2021-01-02 DIAGNOSIS — I700 Atherosclerosis of aorta: Secondary | ICD-10-CM | POA: Diagnosis not present

## 2021-01-02 DIAGNOSIS — Z951 Presence of aortocoronary bypass graft: Secondary | ICD-10-CM | POA: Diagnosis not present

## 2021-01-02 DIAGNOSIS — I110 Hypertensive heart disease with heart failure: Secondary | ICD-10-CM | POA: Diagnosis present

## 2021-01-02 DIAGNOSIS — Z20822 Contact with and (suspected) exposure to covid-19: Secondary | ICD-10-CM | POA: Diagnosis present

## 2021-01-02 DIAGNOSIS — Z79890 Hormone replacement therapy (postmenopausal): Secondary | ICD-10-CM

## 2021-01-02 DIAGNOSIS — I493 Ventricular premature depolarization: Secondary | ICD-10-CM | POA: Diagnosis present

## 2021-01-02 DIAGNOSIS — R4020 Unspecified coma: Secondary | ICD-10-CM | POA: Diagnosis not present

## 2021-01-02 DIAGNOSIS — Z87891 Personal history of nicotine dependence: Secondary | ICD-10-CM

## 2021-01-02 DIAGNOSIS — R55 Syncope and collapse: Secondary | ICD-10-CM | POA: Diagnosis not present

## 2021-01-02 DIAGNOSIS — I482 Chronic atrial fibrillation, unspecified: Secondary | ICD-10-CM | POA: Diagnosis present

## 2021-01-02 DIAGNOSIS — I5042 Chronic combined systolic (congestive) and diastolic (congestive) heart failure: Secondary | ICD-10-CM | POA: Diagnosis present

## 2021-01-02 DIAGNOSIS — Z881 Allergy status to other antibiotic agents status: Secondary | ICD-10-CM

## 2021-01-02 DIAGNOSIS — Z882 Allergy status to sulfonamides status: Secondary | ICD-10-CM

## 2021-01-02 DIAGNOSIS — R29818 Other symptoms and signs involving the nervous system: Secondary | ICD-10-CM | POA: Diagnosis not present

## 2021-01-02 DIAGNOSIS — Z7401 Bed confinement status: Secondary | ICD-10-CM | POA: Diagnosis not present

## 2021-01-02 DIAGNOSIS — R410 Disorientation, unspecified: Secondary | ICD-10-CM | POA: Diagnosis not present

## 2021-01-02 DIAGNOSIS — Z743 Need for continuous supervision: Secondary | ICD-10-CM | POA: Diagnosis not present

## 2021-01-02 DIAGNOSIS — Z888 Allergy status to other drugs, medicaments and biological substances status: Secondary | ICD-10-CM | POA: Diagnosis not present

## 2021-01-02 DIAGNOSIS — Z66 Do not resuscitate: Secondary | ICD-10-CM | POA: Diagnosis present

## 2021-01-02 DIAGNOSIS — G9340 Encephalopathy, unspecified: Secondary | ICD-10-CM

## 2021-01-02 DIAGNOSIS — I6523 Occlusion and stenosis of bilateral carotid arteries: Secondary | ICD-10-CM | POA: Diagnosis not present

## 2021-01-02 DIAGNOSIS — Z79899 Other long term (current) drug therapy: Secondary | ICD-10-CM

## 2021-01-02 DIAGNOSIS — E785 Hyperlipidemia, unspecified: Secondary | ICD-10-CM | POA: Diagnosis not present

## 2021-01-02 DIAGNOSIS — Z7189 Other specified counseling: Secondary | ICD-10-CM | POA: Diagnosis not present

## 2021-01-02 DIAGNOSIS — Z885 Allergy status to narcotic agent status: Secondary | ICD-10-CM

## 2021-01-02 DIAGNOSIS — R404 Transient alteration of awareness: Secondary | ICD-10-CM | POA: Diagnosis not present

## 2021-01-02 DIAGNOSIS — Z9011 Acquired absence of right breast and nipple: Secondary | ICD-10-CM

## 2021-01-02 LAB — COMPREHENSIVE METABOLIC PANEL
ALT: 11 U/L (ref 0–44)
AST: 13 U/L — ABNORMAL LOW (ref 15–41)
Albumin: 3.3 g/dL — ABNORMAL LOW (ref 3.5–5.0)
Alkaline Phosphatase: 47 U/L (ref 38–126)
Anion gap: 8 (ref 5–15)
BUN: 20 mg/dL (ref 8–23)
CO2: 24 mmol/L (ref 22–32)
Calcium: 9.3 mg/dL (ref 8.9–10.3)
Chloride: 107 mmol/L (ref 98–111)
Creatinine, Ser: 0.88 mg/dL (ref 0.44–1.00)
GFR, Estimated: >60 mL/min (ref >60)
Glucose, Bld: 131 mg/dL — ABNORMAL HIGH (ref 70–99)
Potassium: 3.7 mmol/L (ref 3.5–5.1)
Sodium: 139 mmol/L (ref 135–145)
Total Bilirubin: 0.7 mg/dL (ref 0.3–1.2)
Total Protein: 6.9 g/dL (ref 6.5–8.1)

## 2021-01-02 LAB — CBC
HCT: 32.7 % — ABNORMAL LOW (ref 36.0–46.0)
Hemoglobin: 10.2 g/dL — ABNORMAL LOW (ref 12.0–15.0)
MCH: 28.2 pg (ref 26.0–34.0)
MCHC: 31.2 g/dL (ref 30.0–36.0)
MCV: 90.3 fL (ref 80.0–100.0)
Platelets: 265 10*3/uL (ref 150–400)
RBC: 3.62 MIL/uL — ABNORMAL LOW (ref 3.87–5.11)
RDW: 17.1 % — ABNORMAL HIGH (ref 11.5–15.5)
WBC: 6.8 10*3/uL (ref 4.0–10.5)
nRBC: 0 % (ref 0.0–0.2)

## 2021-01-02 LAB — RESP PANEL BY RT-PCR (FLU A&B, COVID) ARPGX2
Influenza A by PCR: NEGATIVE
Influenza B by PCR: NEGATIVE
SARS Coronavirus 2 by RT PCR: NEGATIVE

## 2021-01-02 LAB — DIFFERENTIAL
Abs Immature Granulocytes: 0.03 10*3/uL (ref 0.00–0.07)
Basophils Absolute: 0 10*3/uL (ref 0.0–0.1)
Basophils Relative: 1 %
Eosinophils Absolute: 0.1 10*3/uL (ref 0.0–0.5)
Eosinophils Relative: 1 %
Immature Granulocytes: 0 %
Lymphocytes Relative: 26 %
Lymphs Abs: 1.8 10*3/uL (ref 0.7–4.0)
Monocytes Absolute: 0.5 10*3/uL (ref 0.1–1.0)
Monocytes Relative: 7 %
Neutro Abs: 4.4 10*3/uL (ref 1.7–7.7)
Neutrophils Relative %: 65 %

## 2021-01-02 LAB — I-STAT CHEM 8, ED
BUN: 25 mg/dL — ABNORMAL HIGH (ref 8–23)
Calcium, Ion: 1.22 mmol/L (ref 1.15–1.40)
Chloride: 106 mmol/L (ref 98–111)
Creatinine, Ser: 0.8 mg/dL (ref 0.44–1.00)
Glucose, Bld: 127 mg/dL — ABNORMAL HIGH (ref 70–99)
HCT: 31 % — ABNORMAL LOW (ref 36.0–46.0)
Hemoglobin: 10.5 g/dL — ABNORMAL LOW (ref 12.0–15.0)
Potassium: 3.9 mmol/L (ref 3.5–5.1)
Sodium: 142 mmol/L (ref 135–145)
TCO2: 26 mmol/L (ref 22–32)

## 2021-01-02 LAB — PROTIME-INR
INR: 1 (ref 0.8–1.2)
Prothrombin Time: 13.2 seconds (ref 11.4–15.2)

## 2021-01-02 LAB — APTT: aPTT: 34 seconds (ref 24–36)

## 2021-01-02 LAB — ETHANOL: Alcohol, Ethyl (B): <10 mg/dL (ref <10)

## 2021-01-02 MED ORDER — LEVETIRACETAM IN NACL 1500 MG/100ML IV SOLN
1500.0000 mg | Freq: Two times a day (BID) | INTRAVENOUS | Status: DC
  Administered 2021-01-02 – 2021-01-04 (×4): 1500 mg via INTRAVENOUS
  Filled 2021-01-02 (×6): qty 100

## 2021-01-02 MED ORDER — IOHEXOL 350 MG/ML SOLN
100.0000 mL | Freq: Once | INTRAVENOUS | Status: AC | PRN
  Administered 2021-01-02: 100 mL via INTRAVENOUS

## 2021-01-02 NOTE — ED Notes (Signed)
Attempted to call pt's nephew (POA) with no answer so left voicemail to call me back.

## 2021-01-02 NOTE — ED Provider Notes (Signed)
[facility] EMERGENCY DEPARTMENT Provider Note   CSN: [id number] Arrival date & time: 01/02/21  1818     History Chief Complaint  Patient presents with   Code Stroke    SUKHDEEP CUADRAS is a 87 y.o. female hx of CABG, stroke, CAD, here presenting with code stroke.  Patient is from a nursing home and last normal was 1715.  Patient was noted to have right eye deviation and right-sided weakness.  Patient is unable to get an answer any questions.  Patient apparently had similar episode last she was admitted about a month ago.  At that time palliative care saw patient and discussed with family.  No tPA was given and family did not want MRI.  Patient came in with a DNR form.  The history is provided by the nursing home and the EMS personnel.       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)    Stroke ([place])    Vitamin D deficiency     Patient Active Problem List   Diagnosis Date Noted   Acute metabolic encephalopathy [id number]   Memory loss    Goals of care, counseling/discussion    Palliative care by specialist    GI bleed 11/27/2020   Encephalopathy 11/03/2020   Fall    Orthostasis    Mitral regurgitation 03/07/2014   Near syncope 03/26/2013   Syncope 05/19/2012   Delirium 09/12/2011   HTN (hypertension) 08/17/2011   HX: breast cancer 08/14/2011   Hypothyroidism 08/14/2011   Hx of CABG 11/19/2010   Hyperlipidemia 11/19/2010    Past Surgical History:  Procedure Laterality Date   BREAST SURGERY     left breast cyst surgery     CARDIAC CATHETERIZATION  01/20/2001   NORMAL. EF 60-70%   CARDIOVASCULAR STRESS TEST  Aug 2010   Normal; EF 74%   CORONARY ARTERY BYPASS GRAFT  12/2000   LEFT INTERNAL MAMMARY TO THE LAD, SAPHENOUS VEIN GRAFT TO THE FIRST DIAGONAL, SAPHENOUS VEIN GRAFT TO THE RAMUS INTERMEDIATE, AND A SEQUENTIAL SAPHENOUS VEIN GRAFT TO THE POSTERIOR DESCENDING AND POSTERIOR LATERAL BRANCHES.   MASS EXCISION  04/08/2011   Procedure: EXCISION MASS;  Surgeon: Wynonia Sours, MD;  Location: [facility];  Service: Orthopedics;  Laterality: Left;  excision mass left palm   MASTECTOMY  1975   RIGHT SIDE   TUBAL LIGATION     1960's     OB History   No obstetric history on file.     Family History  Problem Relation Age of Onset   Alzheimer's disease Mother     Social History   Tobacco Use   Smoking status: Former    Packs/day: 1.00    Years: 4.00    Pack years: 4.00    Types: Cigarettes    Quit date: 06/02/1969    Years since quitting: 51.6   Smokeless tobacco: Never  Substance Use Topics   Alcohol  use: No   Drug use: No    Home Medications Prior to Admission medications   Medication Sig Start Date End Date Taking? Authorizing Provider  acetaminophen (TYLENOL) 325 MG tablet Take 650 mg by mouth 3 (three) times daily.    [provider]  furosemide (LASIX) 20 MG tablet Take 20 mg by mouth daily.    [provider]  levothyroxine (SYNTHROID, LEVOTHROID) 50 MCG tablet Take 50 mcg by mouth daily before breakfast.    [provider]  metoprolol tartrate (LOPRESSOR) 25 MG tablet Take 1 tablet (25 mg total) by mouth 2 (two) times daily. 11/08/20   Amin, Jeanella Flattery, MD  Polyethyl Glycol-Propyl Glycol (SYSTANE OP) Place 2 drops into both eyes every morning.    [provider]  pravastatin (PRAVACHOL) 20 MG tablet Take 1 tablet (20 mg total) by mouth at bedtime. 11/30/20   Hosie Poisson, MD    Allergies    Valium, Codeine, Crestor [rosuvastatin calcium],  Doxycycline, Ezetimibe-simvastatin, Lipitor [atorvastatin calcium], Sulfa drugs cross reactors, Tequin, and Zocor [simvastatin]  Review of Systems   Review of Systems  Neurological:  Positive for speech difficulty and weakness.  Psychiatric/Behavioral:  Positive for confusion.   All other systems reviewed and are negative.  Physical Exam Updated Vital Signs BP (!) 189/58 (BP Location: Left Arm)   Pulse (!) 124   Resp 11   SpO2 96%   Physical Exam Vitals and nursing note reviewed.  Constitutional:      Comments: Altered and has right eye deviation  HENT:     Nose: Nose normal.     Mouth/Throat:     Mouth: Mucous membranes are moist.  Eyes:     Comments: Eye deviation to the right  Cardiovascular:     Rate and Rhythm: Normal rate and regular rhythm.     Pulses: Normal pulses.     Heart sounds: Normal heart sounds.  Pulmonary:     Comments: Diminished breath sounds bilaterally, tachypneic Abdominal:     General: Abdomen is flat.     Palpations: Abdomen is soft.  Musculoskeletal:        General: Normal range of motion.     Cervical back: Normal range of motion.  Skin:    General: Skin is warm.     Capillary Refill: Capillary refill takes less than 2 seconds.  Neurological:     Comments: Altered, R eye deviation, responds to pain on the left and contracted on right side     ED Results / Procedures / Treatments   Labs (all labs ordered are listed, but only abnormal results are displayed) Labs Reviewed  CBC - Abnormal; Notable for the following components:      Result Value   RBC 3.62 (*)    Hemoglobin 10.2 (*)    HCT 32.7 (*)    RDW 17.1 (*)    All other components within normal limits  I-STAT CHEM 8, ED - Abnormal; Notable for the following components:   BUN 25 (*)    Glucose, Bld 127 (*)    Hemoglobin 10.5 (*)    HCT 31.0 (*)    All other components within normal limits  RESP PANEL BY RT-PCR (FLU A&B, COVID) ARPGX2  DIFFERENTIAL  ETHANOL  PROTIME-INR  APTT   COMPREHENSIVE METABOLIC PANEL  RAPID URINE DRUG SCREEN, HOSP PERFORMED  URINALYSIS, ROUTINE W REFLEX MICROSCOPIC    EKG None ED ECG REPORT   Date: 01/02/2021  Rate: 60  Rhythm: normal sinus rhythm  QRS Axis: normal  Intervals: normal  ST/T Wave abnormalities: normal  Conduction Disutrbances:none  Narrative Interpretation:   Old EKG Reviewed: unchanged  I have personally reviewed the EKG tracing and agree with the computerized printout as noted.    Radiology CT HEAD CODE STROKE WO CONTRAST  Result Date: 01/02/2021 CLINICAL DATA:  Code stroke.  Acute neuro deficit.  Aphasia EXAM: CT HEAD WITHOUT CONTRAST TECHNIQUE: Contiguous axial images were obtained from the base of the skull through the vertex without intravenous contrast. COMPARISON:  CT head 12/05/2020 FINDINGS: Brain: Generalized atrophy. Negative for hydrocephalus. Moderate white matter changes with patchy white matter hypodensity bilaterally, unchanged. Negative for acute infarct, hemorrhage, mass Vascular: Negative for hyperdense vessel Skull: Negative Sinuses/Orbits: Negative Other: None ASPECTS ([place] Stroke Program Early CT Score) - Ganglionic level infarction (caudate, lentiform nuclei, internal capsule, insula, M1-M3 cortex): 7 - Supraganglionic infarction (M4-M6 cortex): 3 Total score (0-10 with 10 being normal): 10 IMPRESSION: 1. No acute intracranial abnormality 2. ASPECTS is 10 3. Atrophy and chronic microvascular ischemic change. 4. Code stroke imaging results were communicated on 01/02/2021 at 6:26 pm to provider Lorrin Goodell via text page Electronically Signed   By: Franchot Gallo M.D.   On: 01/02/2021 18:28    Procedures Procedures   CRITICAL CARE Performed by: Wandra Arthurs   Total critical care time: 30 minutes  Critical care time was exclusive of separately billable procedures and treating other patients.  Critical care was necessary to treat or prevent imminent or life-threatening  deterioration.  Critical care was time spent personally by me on the following activities: development of treatment plan with patient and/or surrogate as well as nursing, discussions with consultants, evaluation of patient's response to treatment, examination of patient, obtaining history from patient or surrogate, ordering and performing treatments and interventions, ordering and review of laboratory studies, ordering and review of radiographic studies, pulse oximetry and re-evaluation of patient's condition.   Medications Ordered in ED Medications  iohexol (OMNIPAQUE) 350 MG/ML injection 100 mL (100 mLs Intravenous Contrast Given 01/02/21 1831)    ED Course  I have reviewed the triage vital signs and the nursing notes.  Pertinent labs & imaging results that were available during my care of the patient were reviewed by me and considered in my medical decision making (see chart for details).    MDM Rules/Calculators/A&P                          SALIMA GEBHARD is a 87 y.o. female here presenting with altered mental status.  Patient has right eye deviation.  Considered seizure versus stroke.  Patient is very tachypneic.  However she is DNR and DNI per her form from the facility.  Patient also was seen by palliative care during last admission and patient was DNR at that point.  This point we will get CTA head and neck.  Neurology at bedside   7:43 PM Labs unremarkable and CTA showed no LVO.  Neurology seizure versus stroke.  Patient is not a tPA candidate.  They recommend loading with IV Keppra and admission for stroke vs seizure   Final Clinical Impression(s) / ED Diagnoses Final diagnoses:  None    Rx / DC Orders ED Discharge Orders     None        Drenda Freeze, MD 01/02/21 2332

## 2021-01-02 NOTE — ED Notes (Signed)
I woke pt up for modified NIH exam. Pt had just urinated in pure wick so urine collected and sent down.

## 2021-01-02 NOTE — Consult Note (Addendum)
Neurology Consultation  Reason for Consult: Acute unresponsiveness, right gaze, right neglect per EMS. Code Stroke activated Referring Physician: Dr. Darl Householder  CC: Altered level of consciousness with decreased responsiveness   History is obtained from: EMS, Chart review  HPI: Natalie Bullock is a 87 y.o. female with a medical history significant for essential hypertension, coronary artery disease s/p CABG, hyperlipidemia, ischemic heart disease, myocardial infarction, paroxysmal atrial fibrillation not on anticoagulation due to high risk of falls, vitamin D deficiency, breast cancer s/p right mastectomy, and vascular dementia who presented to the ED from her skilled nursing facility for evaluation of acute onset of unresponsiveness, right-sided neglect, and right gaze preference. She did not have witnessed seizure activity by staff but they note that she is usually "spunky" and approximately 15 minutes prior to unresponsive state she was conversant with staff, sitting up in the bed, making jokes. Of note, she had a recent similar presentation on December 05, 2020 for acute confusion, right gaze, and left-sided neglect and was evaluated as a code stroke at that time. Work up at that time included EEG with moderate diffuse encephalopathy without seizures or epileptiform discharges, CTH without evidence of infarct, and vessel imaging without large or medium vessel occlusion. At that time her Vitamin B12, TSH, and RPR were normal but family requested that MRI not be completed.   Of note, Natalie Bullock was living independently, managing her own medications, and ambulating with a walker approximately 7 weeks ago. She sustained a fall on 11/03/2020 and was found confused with multiple skin abrasions. Per chart review, she returned to baseline mental status prior to discharge to a skilled nursing facility for further rehabilitation.  But according to the nephew patient's mental status did not return to baseline that was present prior  to her fall while she was living independently.  On 11/27/2020 she presented back to the ED for evaluation of rectal bleeding and was treated conservatively.  And her hemoglobin had dropped from 13.7-9.7.  Blood transfusion was considered but not given.  GI was consulted and deferred colonoscopy.  Per patient's nephew at bedside, patient has required significant assistance to complete her ADLs and has been progressively confused beyond baseline since her fall.   LKW: 17:15  tpa given?: no, patient with rapid improvement of mental status on hospital arrival with negative CTH for acute hemorrhage or evidence of ischemic stroke. Recent GIB contraindication. IR Thrombectomy? No, vessel imaging obtained without evidence of LVO Modified Rankin Scale: 4-Needs assistance to walk and tend to bodily needs  ROS: A complete ROS was performed and is negative except as noted in the HPI.  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 EF 60-70%  CARDIOVASCULAR STRESS TEST  Aug 2010   Normal; EF 74%   CORONARY ARTERY BYPASS GRAFT  12/2000   LEFT INTERNAL MAMMARY TO THE LAD, SAPHENOUS VEIN GRAFT TO THE FIRST DIAGONAL, SAPHENOUS VEIN GRAFT TO THE RAMUS INTERMEDIATE, AND A SEQUENTIAL SAPHENOUS VEIN GRAFT TO THE POSTERIOR DESCENDING AND POSTERIOR LATERAL BRANCHES.   MASS EXCISION  04/08/2011   Procedure:  EXCISION MASS;  Surgeon: Wynonia Sours, MD;  Location: [facility];  Service: Orthopedics;  Laterality: Left;  excision mass left palm   MASTECTOMY  1975   RIGHT SIDE   TUBAL LIGATION     35's   Family History  Problem Relation Age of Onset   Alzheimer's disease Mother    Social History:   reports that she quit smoking about 51 years ago. Her smoking use included cigarettes. She has a 4.00 pack-year smoking history. She has never used smokeless tobacco. She reports that she does not drink alcohol and does not use drugs.  Medications No current facility-administered medications for this encounter.  Current Outpatient Medications:    acetaminophen (TYLENOL) 325 MG tablet, Take 650 mg by mouth 3 (three) times daily., Disp: , Rfl:    furosemide (LASIX) 20 MG tablet, Take 20 mg by mouth daily., Disp: , Rfl:    levothyroxine (SYNTHROID, LEVOTHROID) 50 MCG tablet, Take 50 mcg by mouth daily before breakfast., Disp: , Rfl:    metoprolol tartrate (LOPRESSOR) 25 MG tablet, Take 1 tablet (25 mg total) by mouth 2 (two) times daily., Disp: , Rfl:    Polyethyl Glycol-Propyl Glycol (SYSTANE OP), Place 2 drops into both eyes every morning., Disp: , Rfl:    pravastatin (PRAVACHOL) 20 MG tablet, Take 1 tablet (20 mg total) by mouth at bedtime., Disp: , Rfl:   Exam: Current vital signs: BP (!) 189/58 (BP Location: Left Arm)   Pulse (!) 124   Resp 11   SpO2 96%  Vital signs in last 24 hours: Pulse Rate:  [124] 124 (08/03 1827) Resp:  [11] 11 (08/03 1827) BP: (189)/(58) 189/58 (08/03 1827) SpO2:  [96 %] 96 % (08/03 1827)  GENERAL: Frail appearing patient on EMS stretcher with NRB mask on.  Head: Normocephalic and atraumatic without obvious abnormality EENT: Normal conjunctivae, no OP obstruction, edentulous LUNGS: Patient with nonrebreather mask in place, SpO2 100% on cardiac monitor, no respiratory distress noted CV: Irregular rate and rhythm on cardiac monitor ABDOMEN: Soft,  non-tender, non-distended Ext: warm, well perfused, without obvious deformity  NEURO:  Mental Status: Drowsy, initially without vocalization or verbal responsiveness.  During later examination she states that her name is "old lady" but does not answer further examiner questions. Speech/Language: speech is mute initially with later assessment revealing mild dysarthria due to edentulous state.   She does not follow commands, name objects, or attempt to repeat phrases.  She does not appear to have signs of neglect.  Cranial Nerves:  II: PERRL 4 mm/brisk.  III, IV, VI: Oculocephalic reflex remains intact. Initial report of right gaze from EMS without visualized right gaze palsy on examination.  V: Does not consistently blink to threat throughout.  VII: Face appears symmetric resting and with movement VIII: Hearing is intact to voice (after initial assessment) IX, X: Palate elevates symmetrically XI: Head appears grossly midline XII: Does not protrude tongue to command Motor: Strength assessment partially limited due to patient not following commands. She moves each extremity spontaneously with left upper and lower extremities with spontaneous antigravity movements and right upper and  lower extremity movements with spontaneous movements without antigravity attempt. She withdraws bilateral lower extremities equally with application of noxious stimuli.  Tone is normal. Bulk is normal.  Sensation: Withdraws extremities throughout equally with application of noxious stimuli Coordination: UTA due to patient's mental status DTRs: 2+ and symmetric patellae, biceps, and brachioradialis  Gait: Deferred  NIHSS: 1a Level of Conscious.: 1 1b LOC Questions: 2 1c LOC Commands: 2 2 Best Gaze: 0 3 Visual: 3 4 Facial Palsy: 0 5a Motor Arm - left: 2 5b Motor Arm - Right: 3 6a Motor Leg - Left: 2 6b Motor Leg - Right: 3 7 Limb Ataxia: 0 8 Sensory: 0 9 Best Language: 3 10 Dysarthria: 2 11 Extinct.  and Inatten.: 0 TOTAL: 23  Labs I have reviewed labs in epic and the results pertinent to this consultation are: CBC    Component Value Date/Time   WBC 6.8 01/02/2021 1820   RBC 3.62 (L) 01/02/2021 1820   HGB 10.5 (L) 01/02/2021 1820   HGB 10.2 (L) 01/02/2021 1820   HCT 31.0 (L) 01/02/2021 1820   HCT 32.7 (L) 01/02/2021 1820   PLT 265 01/02/2021 1820   MCV 90.3 01/02/2021 1820   MCH 28.2 01/02/2021 1820   MCHC 31.2 01/02/2021 1820   RDW 17.1 (H) 01/02/2021 1820   LYMPHSABS 1.8 01/02/2021 1820   MONOABS 0.5 01/02/2021 1820   EOSABS 0.1 01/02/2021 1820   BASOSABS 0.0 01/02/2021 1820   CMP     Component Value Date/Time   NA 142 01/02/2021 1820   K 3.9 01/02/2021 1820   CL 106 01/02/2021 1820   CO2 21 (L) 12/05/2020 1326   GLUCOSE 127 (H) 01/02/2021 1820   BUN 25 (H) 01/02/2021 1820   CREATININE 0.80 01/02/2021 1820   CALCIUM 8.9 12/05/2020 1326   CALCIUM 9.5 09/13/2011 1520   PROT 6.0 (L) 12/05/2020 1326   ALBUMIN 2.7 (L) 12/05/2020 1326   AST 15 12/05/2020 1326   ALT 11 12/05/2020 1326   ALKPHOS 48 12/05/2020 1326   BILITOT 0.7 12/05/2020 1326   GFRNONAA 57 (L) 12/05/2020 1326   GFRAA >60 10/08/2017 1815   Lipid Panel     Component Value Date/Time   CHOL 125 03/26/2013 1425   TRIG 57 03/26/2013 1425   HDL 56 03/26/2013 1425   CHOLHDL 2.2 03/26/2013 1425   VLDL 11 03/26/2013 1425   LDLCALC 58 03/26/2013 1425   Lab Results  Component Value Date   HGBA1C 5.5 03/26/2013   Lab Results  Component Value Date   VITAMINB12 305 12/05/2020   Lab Results  Component Value Date   TSH 0.724 12/05/2020   Imaging I have reviewed the images obtained:  CT-scan of the brain 01/02/2021: 1. No acute intracranial abnormality 2. ASPECTS is 10 3. Atrophy and chronic microvascular ischemic change.  CTA head and neck 01/02/2021: 1. Negative for intracranial large vessel occlusion or significant stenosis. 2. Mild atherosclerotic disease in the carotid bifurcation  bilaterally without flow limiting stenosis. No significant vertebral artery stenosis. Right vertebral artery ends in PICA.  Assessment: 87 y.o. female who presented to the ED for evaluation of acute onset decreased responsiveness, right-sided neglect, and right gaze preference per EMS. Patient has had a progressive cognitive and functional decline since she fell at home on 11/07/2020 and has lost her independence. On initial assessment patient was nonverbal without obvious gaze palsy and left > right spontaneous movements. On reassessment, patient was more awake and alert, verbalizing but was not oriented and was  moving all extremities. Assessments varied with extremity strength per patient's cooperation. tPA was not given due to recent GIB and patient not a candidate for thrombectomy due to no LVO on vessel imaging. Ddx includes toxic / metabolic / infectious encephalopathy, seizure with post-ictal state, small stroke not visualized on CT imaging, or cerebral hypoperfusion without infarction. It is felt less likely that her mental status is related to stroke with rapid improvement in mental status.   Impression: Acute encephalopathy- etiology unclear, resolving  Recommendations: - MRI brain without contrast - cEEG monitoring tomorrow - IV Keppra loading dose 1,500 mg  - Inpatient seizure precautions - Ativan '2mg'$  PRN for any seizure lasting > 5 minutes and notify neurology  Pt seen by NP/Neuro and later by MD. Note/plan to be edited by MD as needed.  Anibal Henderson, AGAC-NP [facility] Pager: [phone]  NEUROHOSPITALIST ADDENDUM Performed a face to face diagnostic evaluation.   I have reviewed the contents of history and physical exam as documented by PA/ARNP/Resident and agree with above documentation.  I have discussed and formulated the above plan as documented. Edits to the note have been made as needed.  Impression/Key exam findings/Plan: 59F with afibb not on AC due to  recent GI bleed p/w obtunded mentation initially along with some movement in LUE and LLE but not in RUE and RLE. Per EMS, initially had a R gaze preference. CTH and CTA negative for ICH or large territory infarct and no LVO. Improving after CT scans and now some speech, moves LUE and LLE more so than RUE and RLE but no gaze preference and crosses midline on doll's eye maneuver.  Had a similar episode when she presented to the hospital last month and improved spontaneously and discharged. rEEG was negative for seizures. MRI was not pursued last time due to concern that she might not be able to lay still and on the wishes of family as they wanted to pursue conservative comfort focused management.  If the family is okay, recommend getting MRI Brain w/o contrast tonight. I loaded her up with Keppra '1500mg'$  IV once. Will plan to get her on cEEG tomorrow AM to assess for ane epileptogenic discharges or seizures. Use Ativan '2mg'$  for any seizure activity lasting more than 5 mins.  This patient is critically ill and at significant risk of neurological worsening, death and care requires constant monitoring of vital signs, hemodynamics,respiratory and cardiac monitoring, neurological assessment, discussion with family, other specialists and medical decision making of high complexity. I spent 50 minutes of neurocritical care time  in the care of  this patient. This was time spent independent of any time provided by nurse practitioner or PA.  Donnetta Simpers [facility] Pager Number [phone] 01/02/2021  7:41 PM  Donnetta Simpers, MD [facility] [phone]   If 7pm to 7am, please call on call as listed on AMION.

## 2021-01-02 NOTE — ED Notes (Addendum)
Pt hooked up to cardiac monitor. Pt now talking. Some answers make sense, but most of the time her words don't make sense. Alert, but disoriented x4. Warm blankets placed on pt.

## 2021-01-02 NOTE — H&P (Signed)
Natalie Bullock [id number] DOB: [birth year] DOA: 01/02/2021     PCP: Seward Carol, MD   Outpatient Specialists: * NONE CARDS: [place] PA Dr. Stanford Breed    Patient arrived to ER on 01/02/21 at 1818 Referred by Attending Drenda Freeze, MD   Patient coming from:  From facility [facility]  Chief Complaint:   Chief Complaint  Patient presents with   Code Stroke    HPI: Natalie Bullock is a 87 y.o. female with medical history significant of chronic afib, hyperlipidemia, ischemic heart disease, CAD sp CABG hypertension, and previous MI, dementia, hypothyroidism    Presented WITH CONFUSION  LKW at 1715 and now complaining of increased confusion worse than her baseline. Noted to have eye deviation to the right suspected seizure  Patient is not a candidate for tPA due to low suspicion of stroke Family did not want MRI   Patient was living independently   ambulating with a walker approximately 7 weeks ago. She fell on 11/03/2020 and was found confused with multiple skin abrasions. Per chart review, she returned to baseline mental status prior to discharge to a skilled nursing facility for further rehabilitation.  according to the nephew patient's mental status did not return to baseline that was present prior to her fall while she was living independently.  On 11/27/2020 she went back to the ED for evaluation of rectal bleeding and was treated conservatively.  And her hemoglobin had dropped no blood was given.  GI was consulted and deferred colonoscopy.  Per patient's nephew at bedside, patient has required significant assistance to complete her ADLs and has been progressively confused beyond baseline since her fall.       Initial COVID TEST  NEGATIVE   Lab Results  Component Value Date   SARSCOV2NAA NEGATIVE 01/02/2021   [place] NEGATIVE 12/05/2020   [place] NEGATIVE 11/30/2020   [place] NEGATIVE 11/27/2020     Regarding pertinent Chronic problems:  Hyperlipidemia -  on  statins Pravachol Lipid Panel     Component Value Date/Time   CHOL 125 03/26/2013 1425   TRIG 57 03/26/2013 1425   HDL 56 03/26/2013 1425   CHOLHDL 2.2 03/26/2013 1425   VLDL 11 03/26/2013 1425   LDLCALC 58 03/26/2013 1425     HTN on lopressor   chronic CHF diastolic/systolic/ combined - last echo 11/05/2020 On Lasix by estimation, is 55 to 60% Left atrial size was moderately dilated    CAD  - On Aspirin, statin, betablocker, Plavix                 - followed by cardiology                 Hypothyroidism:  Lab Results  Component Value Date   TSH 0.724 12/05/2020   on synthroid    A. Fib -  - CHA2DS2 vas score   CHA2DS2/VAS Stroke Risk Points      N/A >= 2   Not on anticoagulation secondary to Risk of Falls         -  Rate control:  Coreg    Dementia - chronic   Chronic anemia - baseline hg Hemoglobin & Hematocrit  Recent Labs    12/05/20 1326 12/05/20 1334 01/02/21 1820  HGB 9.7* 9.9* 10.2*  10.5*     While in ER:  CT non ACUTE Neurology rec They recommend loading with IV Keppra and admission for stroke vs seizure   ED Triage Vitals [01/02/21 1827]  Enc Vitals  Group     BP (!) 189/58     Pulse Rate (!) 124     Resp 11     Temp      Temp src      SpO2 96 %     Weight      Height      Head Circumference      Peak Flow      Pain Score      Pain Loc      Pain Edu?      Excl. in [place]?   [id number]     _________________________________________ Significant initial  Findings: Abnormal Labs Reviewed  CBC - Abnormal; Notable for the following components:      Result Value   RBC 3.62 (*)    Hemoglobin 10.2 (*)    HCT 32.7 (*)    RDW 17.1 (*)    All other components within normal limits  COMPREHENSIVE METABOLIC PANEL - Abnormal; Notable for the following components:   Glucose, Bld 131 (*)    Albumin 3.3 (*)    AST 13 (*)    All other components within normal limits  I-STAT CHEM 8, ED - Abnormal; Notable for the following components:   BUN 25 (*)     Glucose, Bld 127 (*)    Hemoglobin 10.5 (*)    HCT 31.0 (*)    All other components within normal limits   ____________________________________________ Ordered CT HEAD  NON acute  CXR -  NON acute    _________________________ Troponin  ordered ECG: Ordered Personally reviewed by me showing: HR : 60 Rhythm:  NSR,    no evidence of ischemic changes QTC 446 _____________  The recent clinical data is shown below. Vitals:   01/02/21 1827 01/02/21 1854 01/02/21 1900 01/02/21 1931  BP: (!) 189/58  (!) 146/97 (!) 176/75  Pulse: (!) 124  83 60  Resp: 11   20  SpO2: 96% 100% 100% 100%    WBC     Component Value Date/Time   WBC 6.8 01/02/2021 1820   LYMPHSABS 1.8 01/02/2021 1820   MONOABS 0.5 01/02/2021 1820   EOSABS 0.1 01/02/2021 1820   BASOSABS 0.0 01/02/2021 1820    Lactic Acid, Venous    Component Value Date/Time   LATICACIDVEN 1.4 11/03/2020 1205      UA  ordered   Results for orders placed or performed during the hospital encounter of 01/02/21  Resp Panel by RT-PCR (Flu A&B, Covid) Nasopharyngeal Swab     Status: None   Collection Time: 01/02/21  6:16 PM   Specimen: Nasopharyngeal Swab; Nasopharyngeal(NP) swabs in vial transport medium  Result Value Ref Range Status   SARS Coronavirus 2 by RT PCR NEGATIVE NEGATIVE Final        Influenza A by PCR NEGATIVE NEGATIVE Final   Influenza B by PCR NEGATIVE NEGATIVE Final          _______________________________________________________ ER Provider Called:   Neurology   Dr. Lorrin Goodell They Recommend admit to medicine     SEEN in ER _______________________________________________ Hospitalist was called for admission for  acute ecephalopathy  The following Work up has been ordered so far:  Orders Placed This Encounter  Procedures   Resp Panel by RT-PCR (Flu A&B, Covid) Nasopharyngeal Swab   CT HEAD CODE STROKE WO CONTRAST   CT ANGIO HEAD CODE STROKE   CT ANGIO NECK CODE STROKE   DG Chest Port 1 View   Ethanol    Protime-INR  APTT   CBC   Differential   Comprehensive metabolic panel   Urine rapid drug screen (hosp performed)   Urinalysis, Routine w reflex microscopic   Diet NPO time specified   Vital signs   Cardiac Monitoring   Modified Stroke Scale (mNIHSS) Document mNIHSS assessment every 2 hours for a total of 12 hours   Stroke swallow screen   Initiate Carrier Fluid Protocol   If O2 sat If O2 Sat < 94%, administer O2 at 2 liters/minute via nasal cannula.   Do not attempt resuscitation/DNR   Janeece Riggers to Activate Code Stroke Call [facility] [phone]   Consult to hospitalist   Pulse oximetry, continuous   I-stat chem 8, ED   ED EKG   EKG 12-Lead   Saline lock IV     Following Medications were ordered in ER: Medications  levETIRAcetam (KEPPRA) IVPB 1500 mg/ 100 mL premix (1,500 mg Intravenous New Bag/Given 01/02/21 1942)  iohexol (OMNIPAQUE) 350 MG/ML injection 100 mL (100 mLs Intravenous Contrast Given 01/02/21 1831)        Consult Orders  (From admission, onward)           Start     Ordered   01/02/21 1941  Consult to hospitalist  Paged by Kalman Shan to [facility]       Provider:  (Not yet assigned)  Question Answer Comment  Place call to: [facility]   Reason for Consult Admit      01/02/21 1941             OTHER Significant initial  Findings:  labs showing:    Recent Labs  Lab 01/02/21 1820  NA 139  142  K 3.7  3.9  CO2 24  GLUCOSE 131*  127*  BUN 20  25*  CREATININE 0.88  0.80  CALCIUM 9.3    Cr    stable,    Lab Results  Component Value Date   CREATININE 0.80 01/02/2021   CREATININE 0.88 01/02/2021   CREATININE 0.90 12/05/2020    Recent Labs  Lab 01/02/21 1820  AST 13*  ALT 11  ALKPHOS 47  BILITOT 0.7  PROT 6.9  ALBUMIN 3.3*   Lab Results  Component Value Date   CALCIUM 9.3 01/02/2021    Plt: Lab Results  Component Value Date   PLT 265 01/02/2021    COVID-19 Labs  No results for input(s): DDIMER, FERRITIN, LDH, CRP  in the last 72 hours.  Lab Results  Component Value Date   SARSCOV2NAA NEGATIVE 01/02/2021   SARSCOV2NAA NEGATIVE 12/05/2020   SARSCOV2NAA NEGATIVE 11/30/2020   [place] NEGATIVE 11/27/2020      Recent Labs  Lab 01/02/21 1820  WBC 6.8  NEUTROABS 4.4  HGB 10.2*  10.5*  HCT 32.7*  31.0*  MCV 90.3  PLT 265    HG/HCT   stable,      Component Value Date/Time   HGB 10.5 (L) 01/02/2021 1820   HGB 10.2 (L) 01/02/2021 1820   HCT 31.0 (L) 01/02/2021 1820   HCT 32.7 (L) 01/02/2021 1820   MCV 90.3 01/02/2021 1820      No results for input(s): LIPASE, AMYLASE in the last 168 hours. No results for input(s): AMMONIA in the last 168 hours.   Cardiac Panel (last 3 results) No results for input(s): CKTOTAL, CKMB, TROPONINI, RELINDX in the last 72 hours.   BNP (last 3 results) No results for input(s): BNP in the last 8760 hours.    DM  labs:  HbA1C:  No results for input(s): HGBA1C in the last 8760 hours.     CBG (last 3)  No results for input(s): GLUCAP in the last 72 hours.        Cultures:    Component Value Date/Time   SDES URINE, CLEAN CATCH 03/26/2013 0624   SPECREQUEST CX ADDED AT [place] [facility] [id number] 03/26/2013 0624   CULT  03/26/2013 [id number]    Multiple bacterial morphotypes present, none predominant. Suggest appropriate recollection if clinically indicated. Performed at [facility] 03/27/2013 FINAL 03/26/2013 0624     Radiological Exams on Admission: DG Chest Port 1 View  Result Date: 01/02/2021 CLINICAL DATA:  Altered mental status EXAM: PORTABLE CHEST 1 VIEW COMPARISON:  11/03/2020 FINDINGS: Atherosclerotic calcification of the aortic arch. Prior CABG. Mild lingular scarring. Mildly low lung volumes. Prior right mastectomy. No blunting of the costophrenic angles. Mild thoracic spondylosis. IMPRESSION: 1. No acute thoracic findings. 2.  Aortic Atherosclerosis (ICD10-I70.0).  Prior CABG. 3. Chronic lingular scarring. Electronically Signed    By: Van Clines M.D.   On: 01/02/2021 19:20   CT HEAD CODE STROKE WO CONTRAST  Result Date: 01/02/2021 CLINICAL DATA:  Code stroke.  Acute neuro deficit.  Aphasia EXAM: CT HEAD WITHOUT CONTRAST TECHNIQUE: Contiguous axial images were obtained from the base of the skull through the vertex without intravenous contrast. COMPARISON:  CT head 12/05/2020 FINDINGS: Brain: Generalized atrophy. Negative for hydrocephalus. Moderate white matter changes with patchy white matter hypodensity bilaterally, unchanged. Negative for acute infarct, hemorrhage, mass Vascular: Negative for hyperdense vessel Skull: Negative Sinuses/Orbits: Negative Other: None ASPECTS ([place] Stroke Program Early CT Score) - Ganglionic level infarction (caudate, lentiform nuclei, internal capsule, insula, M1-M3 cortex): 7 - Supraganglionic infarction (M4-M6 cortex): 3 Total score (0-10 with 10 being normal): 10 IMPRESSION: 1. No acute intracranial abnormality 2. ASPECTS is 10 3. Atrophy and chronic microvascular ischemic change. 4. Code stroke imaging results were communicated on 01/02/2021 at 6:26 pm to provider Lorrin Goodell via text page Electronically Signed   By: Franchot Gallo M.D.   On: 01/02/2021 18:28   CT ANGIO HEAD CODE STROKE  Result Date: 01/02/2021 CLINICAL DATA:  Acute neuro deficit.  Aphasia EXAM: CT ANGIOGRAPHY HEAD AND NECK TECHNIQUE: Multidetector CT imaging of the head and neck was performed using the standard protocol during bolus administration of intravenous contrast. Multiplanar CT image reconstructions and MIPs were obtained to evaluate the vascular anatomy. Carotid stenosis measurements (when applicable) are obtained utilizing NASCET criteria, using the distal internal carotid diameter as the denominator. CONTRAST:  148m OMNIPAQUE IOHEXOL 350 MG/ML SOLN COMPARISON:  CT head 01/02/2021 FINDINGS: CTA NECK FINDINGS Aortic arch: Mild atherosclerotic disease aortic arch. Proximal great vessels widely patent. Right carotid  system: Mild atherosclerotic calcification right carotid bifurcation. No significant stenosis. Left carotid system: Mild atherosclerotic calcification left carotid bulb. Negative for stenosis. Vertebral arteries: Left vertebral artery dominant. Both vertebral arteries patent to the skull base without stenosis. Skeleton: Mild cervical spondylosis.  No acute skeletal abnormality. Other neck: 10 mm right thyroid nodule. No further imaging necessary based on nodule size. No mass or adenopathy. Upper chest: Lung apices clear bilaterally. Review of the MIP images confirms the above findings CTA HEAD FINDINGS Anterior circulation: Atherosclerotic calcification in the cavernous carotid bilaterally. No flow limiting stenosis. Anterior and middle cerebral arteries patent bilaterally without stenosis. Posterior circulation: Left vertebral artery supplies the basilar. Mild calcific stenosis at the skull base. Left PICA patent. Right vertebral artery ends in PICA. Basilar widely patent. Superior cerebellar  and posterior cerebral arteries patent. No large vessel occlusion or flow limiting stenosis. Venous sinuses: Normal venous enhancement. Anatomic variants: None Review of the MIP images confirms the above findings IMPRESSION: 1. Negative for intracranial large vessel occlusion or significant stenosis. 2. Mild atherosclerotic disease in the carotid bifurcation bilaterally without flow limiting stenosis. No significant vertebral artery stenosis. Right vertebral artery ends in PICA. Electronically Signed   By: Franchot Gallo M.D.   On: 01/02/2021 18:43   CT ANGIO NECK CODE STROKE  Result Date: 01/02/2021 CLINICAL DATA:  Acute neuro deficit.  Aphasia EXAM: CT ANGIOGRAPHY HEAD AND NECK TECHNIQUE: Multidetector CT imaging of the head and neck was performed using the standard protocol during bolus administration of intravenous contrast. Multiplanar CT image reconstructions and MIPs were obtained to evaluate the vascular anatomy.  Carotid stenosis measurements (when applicable) are obtained utilizing NASCET criteria, using the distal internal carotid diameter as the denominator. CONTRAST:  180m OMNIPAQUE IOHEXOL 350 MG/ML SOLN COMPARISON:  CT head 01/02/2021 FINDINGS: CTA NECK FINDINGS Aortic arch: Mild atherosclerotic disease aortic arch. Proximal great vessels widely patent. Right carotid system: Mild atherosclerotic calcification right carotid bifurcation. No significant stenosis. Left carotid system: Mild atherosclerotic calcification left carotid bulb. Negative for stenosis. Vertebral arteries: Left vertebral artery dominant. Both vertebral arteries patent to the skull base without stenosis. Skeleton: Mild cervical spondylosis.  No acute skeletal abnormality. Other neck: 10 mm right thyroid nodule. No further imaging necessary based on nodule size. No mass or adenopathy. Upper chest: Lung apices clear bilaterally. Review of the MIP images confirms the above findings CTA HEAD FINDINGS Anterior circulation: Atherosclerotic calcification in the cavernous carotid bilaterally. No flow limiting stenosis. Anterior and middle cerebral arteries patent bilaterally without stenosis. Posterior circulation: Left vertebral artery supplies the basilar. Mild calcific stenosis at the skull base. Left PICA patent. Right vertebral artery ends in PICA. Basilar widely patent. Superior cerebellar and posterior cerebral arteries patent. No large vessel occlusion or flow limiting stenosis. Venous sinuses: Normal venous enhancement. Anatomic variants: None Review of the MIP images confirms the above findings IMPRESSION: 1. Negative for intracranial large vessel occlusion or significant stenosis. 2. Mild atherosclerotic disease in the carotid bifurcation bilaterally without flow limiting stenosis. No significant vertebral artery stenosis. Right vertebral artery ends in PICA. Electronically Signed   By: CFranchot GalloM.D.   On: 01/02/2021 18:43    _______________________________________________________________________________________________________ Latest  Blood pressure (!) 176/75, pulse 60, resp. rate 20, SpO2 100 %.   Review of Systems:    Pertinent positives include: confusion  Constitutional:  No weight loss, night sweats, Fevers, chills, fatigue, weight loss  HEENT:  No headaches, Difficulty swallowing,Tooth/dental problems,Sore throat,  No sneezing, itching, ear ache, nasal congestion, post nasal drip,  Cardio-vascular:  No chest pain, Orthopnea, PND, anasarca, dizziness, palpitations.no Bilateral lower extremity swelling  GI:  No heartburn, indigestion, abdominal pain, nausea, vomiting, diarrhea, change in bowel habits, loss of appetite, melena, blood in stool, hematemesis Resp:  no shortness of breath at rest. No dyspnea on exertion, No excess mucus, no productive cough, No non-productive cough, No coughing up of blood.No change in color of mucus.No wheezing. Skin:  no rash or lesions. No jaundice GU:  no dysuria, change in color of urine, no urgency or frequency. No straining to urinate.  No flank pain.  Musculoskeletal:  No joint pain or no joint swelling. No decreased range of motion. No back pain.  Psych:  No change in mood or affect. No depression or anxiety. No memory loss.  Neuro: no localizing neurological complaints, no tingling, no weakness, no double vision, no gait abnormality, no slurred speech,    All systems reviewed and apart from [place] all are negative _______________________________________________________________________________________________ Past Medical History:   Past Medical History:  Diagnosis Date   Anemia    Arthritis    osteoarthritis   Breast cancer ([place]) 06/02/1973   Right sided mastectomy   Coronary artery disease    H/O hypokalemia    Hx of CABG    pt states she had 5 bypasses   Hyperlipidemia    Hypertension    Hypothyroidism    IHD (ischemic heart disease)    with  CABG in August 2002   Myocardial infarction [facility])    Non Q-wave MI   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)    Vitamin D deficiency      Past Surgical History:  Procedure Laterality Date   BREAST SURGERY     left breast cyst surgery    CARDIAC CATHETERIZATION  01/20/2001   NORMAL. EF 60-70%   CARDIOVASCULAR STRESS TEST  Aug 2010   Normal; EF 74%   CORONARY ARTERY BYPASS GRAFT  12/2000   LEFT INTERNAL MAMMARY TO THE LAD, SAPHENOUS VEIN GRAFT TO THE FIRST DIAGONAL, SAPHENOUS VEIN GRAFT TO THE RAMUS INTERMEDIATE, AND A SEQUENTIAL SAPHENOUS VEIN GRAFT TO THE POSTERIOR DESCENDING AND POSTERIOR LATERAL BRANCHES.   MASS EXCISION  04/08/2011   Procedure: EXCISION MASS;  Surgeon: Wynonia Sours, MD;  Location: [facility];  Service: Orthopedics;  Laterality: Left;  excision mass left palm   MASTECTOMY  1975   RIGHT SIDE   TUBAL LIGATION     1960's    Social History:  Ambulatory  walker       reports that she quit smoking about 51 years ago. Her smoking use included cigarettes. She has a 4.00 pack-year smoking history. She has never used smokeless tobacco. She reports that she does not drink alcohol and does not use drugs.     Family History:   Family History  Problem Relation Age of Onset   Alzheimer's disease Mother    ______________________________________________________________________________________________ Allergies: Allergies  Allergen Reactions   Valium Shortness Of Breath and Other (See Comments)    "ALLERGIC," per [facility]   Codeine Other (See Comments)    "ALLERGIC," per [facility]   Crestor [Rosuvastatin Calcium] Other (See Comments)    "ALLERGIC," per [facility]   Doxycycline Other (See Comments)    "ALLERGIC," per [facility]   Ezetimibe-Simvastatin Other (See Comments)    Vytorin- "ALLERGIC," per [facility]   Lipitor [Atorvastatin Calcium] Other (See Comments)    "ALLERGIC," per [facility]   Sulfa Drugs Cross Reactors Other (See  Comments)    "ALLERGIC," per [facility]   Tequin Other (See Comments)    "ALLERGIC," per [facility]   Zocor [Simvastatin] Other (See Comments)    "ALLERGIC," per [facility]     Prior to Admission medications   Medication Sig Start Date End Date Taking? Authorizing Provider  acetaminophen (TYLENOL) 325 MG tablet Take 650 mg by mouth 3 (three) times daily.    [provider]  furosemide (LASIX) 20 MG tablet Take 20 mg by mouth daily.    [provider]  levothyroxine (SYNTHROID, LEVOTHROID) 50 MCG tablet Take 50 mcg by mouth daily before breakfast.    [provider]  metoprolol tartrate (LOPRESSOR) 25 MG tablet Take 1 tablet (25 mg total) by mouth 2 (two)  times daily. 11/08/20   Amin, Jeanella Flattery, MD  Polyethyl Glycol-Propyl Glycol (SYSTANE OP) Place 2 drops into both eyes every morning.    [provider]  pravastatin (PRAVACHOL) 20 MG tablet Take 1 tablet (20 mg total) by mouth at bedtime. 11/30/20   Hosie Poisson, MD    ___________________________________________________________________________________________________ Physical Exam: Vitals with BMI 01/02/2021 01/02/2021 01/02/2021  Height - - -  Weight - - -  BMI - - -  Systolic 0000000 [id number]  Diastolic 75 97 58  Pulse 60 83 124     1. General:  in No  Acute distress   Chronically ill  -appearing 2. Psychological: Alert not Oriented 3. Head/ENT:   Dry Mucous Membranes                          Head Non traumatic, neck supple                           Poor Dentition 4. SKIN:  decreased Skin turgor,  Skin clean Dry and intact no rash 5. Heart: Regular rate and rhythm no  Murmur, no Rub or gallop 6. Lungs:  Clear to auscultation bilaterally, no wheezes or crackles   7. Abdomen: Soft,  non-tender, Non distended  bowel sounds present 8. Lower extremities: no clubbing, cyanosis, no  edema 9. Neurologically Grossly intact, moving all 4 extremities equally not following exam requests 10. MSK: Normal range of motion    Chart  has been reviewed  ______________________________________________________________________________________________  Assessment/Plan  87 y.o. female with medical history significant of chronic afib, hyperlipidemia, ischemic heart disease, CAD sp CABG hypertension, and previous MI, dementia, hypothyroidism   Admitted for syncope vs possible seizure  Present on Admission:  Syncope vs seizure -patient has recurrent episodes of the same.  This time have had work-up including echogram.  This time given deviation over eye gaze suspected for possible seizure activity.  Neurology has been consulted.  Order Keppra and seizure precautions.  Defer to neurology if EEG would be warranted. Supportive management   Hypothyroidism -check TSH resume home medication    Hyperlipidemia -resume Pravachol if able to tolerate   HTN (hypertension) -restart home medications when able to tolerate    Acute metabolic encephalopathy -sudden change in mental status now closer to her baseline which is significant dementia. Possibly postictal episode.  Continue to monitor monitor for any evidence of infectious process.  Gently rehydrate  Speech pathology evaluation and diet as per speech pathology.  In the past family declined MRI.  Patient at this point would not be able to tolerate as she is unable to lie still.   Other plan as per orders.  DVT prophylaxis:  SCD     Code Status:    Code Status: DNR  DNR/DNI   I had personally discussed CODE STATUS with  family     Family Communication:   Family not at  Bedside  plan of care was discussed on the phone with nephew Family requests daily check-in from provider  Disposition Plan:   Back to current facility when stable                              Following barriers for discharge:  Electrolytes corrected                               Anemia stable                                                        Will need to be able to tolerate  PO                                                       Will need consultants to evaluate patient prior to discharge                        Would benefit from PT/OT eval prior to DC  Ordered                   Swallow eval - SLP ordered                                      Transition of care consulted                   Nutrition    consulted                                      Palliative care    consulted                                  Consults called: neurology is aware  Admission status:  ED Disposition     ED Disposition  [place]: [place] [[id number]]  Level of Care: Telemetry Medical [[id number]]  May place patient in observation at [facility] or [facility] if equivalent level of care is available:: No  Covid Evaluation: Asymptomatic Screening Protocol (No Symptoms)  Diagnosis: Acute encephalopathy [id number]  Admitting Physician: Toy Baker [[id number]]  Attending Physician: Toy Baker [[id number]]           Obs     Level of care   tele  For 12H      Lab Results  Component Value Date   [place] 01/02/2021     Precautions: admitted as  Covid Negative    PPE: Used by the provider:   N95  eye Goggles,  Gloves       Brylei Pedley 01/03/2021, 12:40 AM    [facility]     after 2 AM please page floor coverage PA If 7AM-7PM, please contact the day team taking care of the patient using [email or web address]   Patient was evaluated in the context of the global COVID-19 pandemic, which necessitated consideration that the patient might be at risk for infection with the SARS-CoV-2 virus that causes COVID-19. Institutional protocols and algorithms that pertain to the evaluation of patients at risk for COVID-19 are  in a state of rapid change based on information released by regulatory bodies including the CDC and federal and state organizations. These policies and algorithms were followed  during the patient's care.

## 2021-01-02 NOTE — ED Triage Notes (Signed)
Pt here from facility. LSW 1715 01/02/21. Pt became lethargic, right sided gaze and non verbal. Pt alert and oriented X2 at baseline.  Pt responsive to pain in triage. Slow respirations.

## 2021-01-02 NOTE — Code Documentation (Addendum)
Stroke Response Nurse Documentation Code Documentation  Natalie Bullock is a 87 y.o. female arriving to [place] ED via [facility] on 01/02/21 with past medical hx of chronic afib, hyperlipidemia, ischemic heart disease, CAD, hypertension, and previous MI. Code stroke was activated by EMS . Patient is from her skilled nursing facility, [facility], where she was LKW at [phone] and now complaining of increased confusion worse than her baseline.  Stroke team and neurology at the bedside on patient arrival. Labs drawn and patient cleared for CT by Dr. Darl Householder. Patient to CT with team. NIHSS 23, see documentation for details and code stroke times.  23 on exam. The following imaging was completed:   (CT, CTA head and neck, CTP). Patient is not a candidate for tPA due to low suspicion of stroke on imaging. EEG ordered to rule out stroke. Care/Plan. Bedside handoff with ED RN Kathlee Nations.    [place]  Stroke Response RN

## 2021-01-03 ENCOUNTER — Encounter (HOSPITAL_COMMUNITY): Payer: Self-pay | Admitting: Internal Medicine

## 2021-01-03 ENCOUNTER — Observation Stay (HOSPITAL_COMMUNITY): Payer: Medicare Other

## 2021-01-03 DIAGNOSIS — Z515 Encounter for palliative care: Secondary | ICD-10-CM | POA: Diagnosis not present

## 2021-01-03 DIAGNOSIS — I5042 Chronic combined systolic (congestive) and diastolic (congestive) heart failure: Secondary | ICD-10-CM | POA: Diagnosis present

## 2021-01-03 DIAGNOSIS — I10 Essential (primary) hypertension: Secondary | ICD-10-CM | POA: Diagnosis not present

## 2021-01-03 DIAGNOSIS — Z66 Do not resuscitate: Secondary | ICD-10-CM | POA: Diagnosis present

## 2021-01-03 DIAGNOSIS — E039 Hypothyroidism, unspecified: Secondary | ICD-10-CM

## 2021-01-03 DIAGNOSIS — I252 Old myocardial infarction: Secondary | ICD-10-CM | POA: Diagnosis not present

## 2021-01-03 DIAGNOSIS — I2510 Atherosclerotic heart disease of native coronary artery without angina pectoris: Secondary | ICD-10-CM | POA: Diagnosis present

## 2021-01-03 DIAGNOSIS — Z7189 Other specified counseling: Secondary | ICD-10-CM

## 2021-01-03 DIAGNOSIS — R55 Syncope and collapse: Secondary | ICD-10-CM | POA: Diagnosis not present

## 2021-01-03 DIAGNOSIS — R414 Neurologic neglect syndrome: Secondary | ICD-10-CM | POA: Diagnosis present

## 2021-01-03 DIAGNOSIS — Z881 Allergy status to other antibiotic agents status: Secondary | ICD-10-CM | POA: Diagnosis not present

## 2021-01-03 DIAGNOSIS — R569 Unspecified convulsions: Secondary | ICD-10-CM | POA: Diagnosis present

## 2021-01-03 DIAGNOSIS — I493 Ventricular premature depolarization: Secondary | ICD-10-CM | POA: Diagnosis present

## 2021-01-03 DIAGNOSIS — Z888 Allergy status to other drugs, medicaments and biological substances status: Secondary | ICD-10-CM | POA: Diagnosis not present

## 2021-01-03 DIAGNOSIS — I482 Chronic atrial fibrillation, unspecified: Secondary | ICD-10-CM | POA: Diagnosis present

## 2021-01-03 DIAGNOSIS — E785 Hyperlipidemia, unspecified: Secondary | ICD-10-CM | POA: Diagnosis present

## 2021-01-03 DIAGNOSIS — I110 Hypertensive heart disease with heart failure: Secondary | ICD-10-CM | POA: Diagnosis present

## 2021-01-03 DIAGNOSIS — Z853 Personal history of malignant neoplasm of breast: Secondary | ICD-10-CM | POA: Diagnosis not present

## 2021-01-03 DIAGNOSIS — F0390 Unspecified dementia without behavioral disturbance: Secondary | ICD-10-CM | POA: Diagnosis not present

## 2021-01-03 DIAGNOSIS — Z20822 Contact with and (suspected) exposure to covid-19: Secondary | ICD-10-CM | POA: Diagnosis present

## 2021-01-03 DIAGNOSIS — G9340 Encephalopathy, unspecified: Secondary | ICD-10-CM

## 2021-01-03 DIAGNOSIS — G9341 Metabolic encephalopathy: Secondary | ICD-10-CM | POA: Diagnosis present

## 2021-01-03 DIAGNOSIS — Z7401 Bed confinement status: Secondary | ICD-10-CM | POA: Diagnosis not present

## 2021-01-03 DIAGNOSIS — Z882 Allergy status to sulfonamides status: Secondary | ICD-10-CM | POA: Diagnosis not present

## 2021-01-03 DIAGNOSIS — Z951 Presence of aortocoronary bypass graft: Secondary | ICD-10-CM | POA: Diagnosis not present

## 2021-01-03 DIAGNOSIS — Z885 Allergy status to narcotic agent status: Secondary | ICD-10-CM | POA: Diagnosis not present

## 2021-01-03 DIAGNOSIS — I69328 Other speech and language deficits following cerebral infarction: Secondary | ICD-10-CM | POA: Diagnosis not present

## 2021-01-03 DIAGNOSIS — E43 Unspecified severe protein-calorie malnutrition: Secondary | ICD-10-CM | POA: Diagnosis present

## 2021-01-03 DIAGNOSIS — R29818 Other symptoms and signs involving the nervous system: Secondary | ICD-10-CM | POA: Diagnosis not present

## 2021-01-03 DIAGNOSIS — F0151 Vascular dementia with behavioral disturbance: Secondary | ICD-10-CM | POA: Diagnosis present

## 2021-01-03 DIAGNOSIS — D649 Anemia, unspecified: Secondary | ICD-10-CM | POA: Diagnosis present

## 2021-01-03 DIAGNOSIS — R410 Disorientation, unspecified: Secondary | ICD-10-CM | POA: Diagnosis not present

## 2021-01-03 DIAGNOSIS — R4701 Aphasia: Secondary | ICD-10-CM | POA: Diagnosis present

## 2021-01-03 LAB — COMPREHENSIVE METABOLIC PANEL
ALT: 10 U/L (ref 0–44)
AST: 15 U/L (ref 15–41)
Albumin: 3 g/dL — ABNORMAL LOW (ref 3.5–5.0)
Alkaline Phosphatase: 38 U/L (ref 38–126)
Anion gap: 8 (ref 5–15)
BUN: 16 mg/dL (ref 8–23)
CO2: 22 mmol/L (ref 22–32)
Calcium: 8.5 mg/dL — ABNORMAL LOW (ref 8.9–10.3)
Chloride: 108 mmol/L (ref 98–111)
Creatinine, Ser: 0.68 mg/dL (ref 0.44–1.00)
GFR, Estimated: >60 mL/min (ref >60)
Glucose, Bld: 73 mg/dL (ref 70–99)
Potassium: 3.8 mmol/L (ref 3.5–5.1)
Sodium: 138 mmol/L (ref 135–145)
Total Bilirubin: 0.7 mg/dL (ref 0.3–1.2)
Total Protein: 6.1 g/dL — ABNORMAL LOW (ref 6.5–8.1)

## 2021-01-03 LAB — CBC WITH DIFFERENTIAL/PLATELET
Abs Immature Granulocytes: 0.03 10*3/uL (ref 0.00–0.07)
Basophils Absolute: 0 10*3/uL (ref 0.0–0.1)
Basophils Relative: 0 %
Eosinophils Absolute: 0.1 10*3/uL (ref 0.0–0.5)
Eosinophils Relative: 1 %
HCT: 31.9 % — ABNORMAL LOW (ref 36.0–46.0)
Hemoglobin: 9.9 g/dL — ABNORMAL LOW (ref 12.0–15.0)
Immature Granulocytes: 1 %
Lymphocytes Relative: 22 %
Lymphs Abs: 1.1 10*3/uL (ref 0.7–4.0)
MCH: 28.4 pg (ref 26.0–34.0)
MCHC: 31 g/dL (ref 30.0–36.0)
MCV: 91.7 fL (ref 80.0–100.0)
Monocytes Absolute: 0.4 10*3/uL (ref 0.1–1.0)
Monocytes Relative: 9 %
Neutro Abs: 3.3 10*3/uL (ref 1.7–7.7)
Neutrophils Relative %: 67 %
Platelets: 236 10*3/uL (ref 150–400)
RBC: 3.48 MIL/uL — ABNORMAL LOW (ref 3.87–5.11)
RDW: 17.2 % — ABNORMAL HIGH (ref 11.5–15.5)
WBC: 5 10*3/uL (ref 4.0–10.5)
nRBC: 0.4 % — ABNORMAL HIGH (ref 0.0–0.2)

## 2021-01-03 LAB — URINALYSIS, ROUTINE W REFLEX MICROSCOPIC
Bilirubin Urine: NEGATIVE
Glucose, UA: NEGATIVE mg/dL
Hgb urine dipstick: NEGATIVE
Ketones, ur: NEGATIVE mg/dL
Leukocytes,Ua: NEGATIVE
Nitrite: NEGATIVE
Protein, ur: NEGATIVE mg/dL
Specific Gravity, Urine: 1.028 (ref 1.005–1.030)
pH: 7 (ref 5.0–8.0)

## 2021-01-03 LAB — RAPID URINE DRUG SCREEN, HOSP PERFORMED
Amphetamines: NOT DETECTED
Barbiturates: NOT DETECTED
Benzodiazepines: NOT DETECTED
Cocaine: NOT DETECTED
Opiates: NOT DETECTED
Tetrahydrocannabinol: NOT DETECTED

## 2021-01-03 LAB — CBG MONITORING, ED: Glucose-Capillary: 120 mg/dL — ABNORMAL HIGH (ref 70–99)

## 2021-01-03 LAB — TSH: TSH: 1.781 u[IU]/mL (ref 0.350–4.500)

## 2021-01-03 LAB — MAGNESIUM: Magnesium: 2.2 mg/dL (ref 1.7–2.4)

## 2021-01-03 LAB — PHOSPHORUS: Phosphorus: 3.3 mg/dL (ref 2.5–4.6)

## 2021-01-03 MED ORDER — ACETAMINOPHEN 650 MG RE SUPP: 650.0000 mg | Freq: Four times a day (QID) | RECTAL | Status: DC | PRN

## 2021-01-03 MED ORDER — PRAVASTATIN SODIUM 40 MG PO TABS
20.0000 mg | ORAL_TABLET | Freq: Every day | ORAL | Status: DC
  Administered 2021-01-03 – 2021-01-05 (×2): 20 mg via ORAL
  Filled 2021-01-03 (×2): qty 1

## 2021-01-03 MED ORDER — SODIUM CHLORIDE 0.9 % IV SOLN
75.0000 mL/h | INTRAVENOUS | Status: AC
  Administered 2021-01-03: 75 mL/h via INTRAVENOUS

## 2021-01-03 MED ORDER — LEVOTHYROXINE SODIUM 50 MCG PO TABS
50.0000 ug | ORAL_TABLET | Freq: Every day | ORAL | Status: DC
  Administered 2021-01-04: 50 ug via ORAL
  Filled 2021-01-03: qty 1

## 2021-01-03 MED ORDER — METOPROLOL TARTRATE 25 MG PO TABS
25.0000 mg | ORAL_TABLET | Freq: Two times a day (BID) | ORAL | Status: DC
  Administered 2021-01-03 – 2021-01-05 (×3): 25 mg via ORAL
  Filled 2021-01-03 (×3): qty 1

## 2021-01-03 MED ORDER — ACETAMINOPHEN 325 MG PO TABS: 650.0000 mg | ORAL_TABLET | Freq: Four times a day (QID) | ORAL | Status: DC | PRN

## 2021-01-03 NOTE — ED Notes (Signed)
Pt appears to be asleep. NAD noted. 

## 2021-01-03 NOTE — Evaluation (Signed)
Occupational Therapy Evaluation Patient Details Name: Natalie Bullock MRN: [id number] DOB: Sep 28, 1924 Today's Date: 01/03/2021    History of Present Illness Pt is a 87yo female presenting to [facility] ED on 8/3 from [facility] due to lethargic, R eye droop, and R sided weakness. concern for stroke. Imaging with no acute findings. Of note, pt recently admitted 7/7-7/8 for acute metabolic encephalopathy. PMH: hypothyroidism, HTN, history of stroke with related speech impairment, dementia, remote history of cancer, CAD s/p CABG.   Clinical Impression   Patient admitted for the diagnosis above.  Per the chart: PTA she was living at a SNF and needed assist with mobility and ADL completion.  Barriers are listed below.  Currently she is limited by back pain, and line/leads in the ED department.  ADL to be further assessed, but it is anticipated, she will return to her SNF.  OT will follow in the acute setting to maximize functional status and assist with discharge to the location listed below.      Follow Up Recommendations  SNF    Equipment Recommendations  None recommended by OT    Recommendations for Other Services       Precautions / Restrictions Precautions Precautions: Fall Precaution Comments: syncopal episode, dementia Restrictions Weight Bearing Restrictions: No      Mobility Bed Mobility Overal bed mobility: Needs Assistance Bed Mobility: Supine to Sit;Sit to Supine     Supine to sit: Min assist;Mod assist Sit to supine: Mod assist   General bed mobility comments: able to sit edge of bed, declined standing.  C/o back pain, and wanting to lie down Patient Response: Cooperative  Transfers                 General transfer comment: To be assessed    Balance Overall balance assessment: Needs assistance Sitting-balance support: Feet unsupported;Bilateral upper extremity supported Sitting balance-Leahy Scale: Fair Sitting balance - Comments: decreased safety                                    ADL either performed or assessed with clinical judgement   ADL                                         General ADL Comments: Patient needing increasing assist at her SNF.  Able to sit EOB for light grooming.  To be further assessed if admitted.     Vision Patient Visual Report: No change from baseline       Perception     Praxis      Pertinent Vitals/Pain Pain Assessment: Faces Faces Pain Scale: Hurts little more Pain Location: Chronic back pain Pain Descriptors / Indicators: Sore;Tightness Pain Intervention(s): Monitored during session     Hand Dominance Right   Extremity/Trunk Assessment Upper Extremity Assessment Upper Extremity Assessment: Generalized weakness   Lower Extremity Assessment Lower Extremity Assessment: Defer to PT evaluation   Cervical / Trunk Assessment Cervical / Trunk Assessment: Normal   Communication Communication Communication: HOH   Cognition Arousal/Alertness: Lethargic Behavior During Therapy: Flat affect Overall Cognitive Status: History of cognitive impairments - at baseline                                 General Comments: Patient oriented  to person and place "they told me this is [facility] I think."   General Comments       Exercises     Shoulder Instructions      Home Living Family/patient expects to be discharged to:: Skilled nursing facility                                 Additional Comments: From [place]      Prior Functioning/Environment Level of Independence: Needs assistance  Gait / Transfers Assistance Needed: unknown, patient states she is still able to walk. ADL's / Homemaking Assistance Needed: Chart suggests increasing assist with ADL at facility   Comments: Pt unable to give history secondary to dementia, but likely requires assist for ADLs and uses some type of AD        OT Problem List: Decreased strength;Decreased activity  tolerance;Impaired balance (sitting and/or standing);Decreased safety awareness;Decreased cognition;Pain      OT Treatment/Interventions: Self-care/ADL training;Therapeutic exercise;DME and/or AE instruction;Balance training;Patient/family education;Therapeutic activities    OT Goals(Current goals can be found in the care plan section) Acute Rehab OT Goals Patient Stated Goal: patient did not state OT Goal Formulation: With patient Time For Goal Achievement: 01/17/21 Potential to Achieve Goals: Fair ADL Goals Pt Will Perform Grooming: with supervision;sitting Pt Will Perform Upper Body Bathing: with supervision;sitting Pt Will Perform Upper Body Dressing: with supervision;sitting Pt Will Transfer to Toilet: with min guard assist;ambulating;bedside commode Pt Will Perform Toileting - Clothing Manipulation and hygiene: with supervision;sitting/lateral leans  OT Frequency: Min 2X/week   Barriers to D/C:    none noted       Co-evaluation              AM-PAC OT "6 Clicks" Daily Activity     Outcome Measure Help from another person eating meals?: A Little Help from another person taking care of personal grooming?: A Little Help from another person toileting, which includes using toliet, bedpan, or urinal?: A Lot Help from another person bathing (including washing, rinsing, drying)?: A Lot Help from another person to put on and taking off regular upper body clothing?: A Little Help from another person to put on and taking off regular lower body clothing?: A Lot 6 Click Score: 15   End of Session Equipment Utilized During Treatment: Oxygen  Activity Tolerance: Other (comment) (limited by ED lines/leads and back pain) Patient left: in bed  OT Visit Diagnosis: Unsteadiness on feet (R26.81);Muscle weakness (generalized) (M62.81);History of falling (Z91.81);Other symptoms and signs involving cognitive function;Pain Pain - Right/Left:  (chronic back)                Time:  1003-1019 OT Time Calculation (min): 16 min Charges:  OT General Charges $OT Visit: 1 Visit OT Evaluation $OT Eval Moderate Complexity: 1 Mod  01/03/2021  Rich, OTR/L  Acute Rehabilitation Services  Office:  [place] 01/03/2021, 10:29 AM

## 2021-01-03 NOTE — Progress Notes (Signed)
LTM maint complete - no skin breakdown under: Fp1 F3 F7 A1 reapplied F3  F7 T7 A1 [facility] monitored, Event button test confirmed by [facility]. Patient and computer was transported to 3w without incident.  Due to patient pulling several leads off, tech requested mittens.

## 2021-01-03 NOTE — Progress Notes (Signed)
Pt arrived on unit from ED via transport along with EEG tech Qiana. On arrival pt was naked, without patient ID wristband and without IV. Contacted ED nurse Tori to come identify pt and she said that she would stop by later. Patient was identified by [place] EEG tech who was with pt in ED.

## 2021-01-03 NOTE — Progress Notes (Signed)
I started an IV, 22 to LFA as pt had no IV access.  I completed [facility] swallow screen as per speech note and paged hospitalist for diet.  See chart for resultant order.

## 2021-01-03 NOTE — Progress Notes (Signed)
LTM EEG hooked up and running - no initial skin breakdown - push button tested - neuro notified.  

## 2021-01-03 NOTE — Progress Notes (Signed)
SLP Cancellation Note  Patient Details Name: Natalie Bullock MRN: [id number] DOB: 03-30-1925   Pt alert, but unwilling to drink any water or try ice chips - refusing adamantly, stating she wants to sleep, and attempting to swing at clinician.  Tried several times with various approaches but Ms. Trojan would have no part of it. Attempts at bedside swallow evaluation aborted.   Pt has not failed the swallow screen per chart review.  RN documented at 18:53 on 8/3 that she was not ready for the screen due to lethargy.  She has not yet participated in the screen.   When/if pt is willing to participate, current RN may proceed with [facility] and does not need to wait for SLP to return for formal SLP swallow eval.  Will follow along.                                                                                                 Vahan Wadsworth L. Tivis Ringer, [place] Office number [phone] Pager [phone]   Assunta Curtis 01/03/2021, 2:39 PM

## 2021-01-03 NOTE — ED Notes (Signed)
Dr. Roel Cluck said to hold all oral meds until swallow eval.

## 2021-01-03 NOTE — ED Notes (Signed)
I messaged Dr. Roel Cluck and asked if she still wanted the oral meds ordered since pt failed swallow study. Waiting for response.

## 2021-01-03 NOTE — Progress Notes (Deleted)
01/03/21 0957  PT Visit Information  Last PT Received On 01/03/21  Assistance Needed +1  History of Present Illness Pt is a 87yo female presenting to [facility] ED on 8/3 from [facility] due to lethargy, R eye droop, and R sided weakness. Imaging with no acute findings. Possibly secondary to acute metabolic encephalopathy (of note, recently admitted in early July for the same). PMH: hypothyroidism, HTN, history of stroke with related speech impairment, dementia, breast cancer, CAD s/p CABG.  Precautions  Precautions Fall  Restrictions  Weight Bearing Restrictions No  Home Living  Family/patient expects to be discharged to: Skilled nursing facility  Additional Comments From [employer]  Prior Function  Level of Independence Needs assistance  Comments Pt unable to give history secondary to dementia, but likely requires assist for ADLs and mobility tasks  Communication  Communication HOH  Pain Assessment  Pain Assessment Faces  Faces Pain Scale 2  Pain Location Left distal LE, left upper arm region, "my back"  Pain Descriptors / Indicators Discomfort  Pain Intervention(s) Monitored during session;Repositioned  Cognition  Arousal/Alertness Awake/alert  Behavior During Therapy Impulsive  Overall Cognitive Status History of cognitive impairments - at baseline  General Comments Pt asking many questions, especially "why are you doing that?" disoriented to place and time (required cues for month); assume this to be at baseline. Easily distractable and at times resistive to therapy.  Upper Extremity Assessment  Upper Extremity Assessment Defer to OT evaluation  Lower Extremity Assessment  Lower Extremity Assessment Generalized weakness;Difficult to assess due to impaired cognition  Cervical / Trunk Assessment  Cervical / Trunk Assessment Normal  Bed Mobility  Overal bed mobility Needs Assistance  Bed Mobility Supine to Sit;Sit to Supine  Supine to sit Mod assist  Sit to supine Supervision   General bed mobility comments Pt made two successful attempts to sit upright with mod assist for trunk control but immediately returned to supine position stating "I'm done." Very impulsive and required max encouragement to participate.  Transfers  General transfer comment Unable to attempt  Ambulation/Gait  General Gait Details Unable to attempt  Balance  Overall balance assessment History of Falls;Needs assistance  Sitting-balance support Bilateral upper extremity supported;Feet unsupported (on stretcher)  Sitting balance-Leahy Scale Poor  Sitting balance - Comments Reliant on external support to maintain sitting  PT - End of Session  Equipment Utilized During Treatment Oxygen (2L via [place]; patient had [place] out of nose initially and did not like when PT readjusted)  Activity Tolerance Treatment limited secondary to agitation (Pt very resistive to therapies)  Patient left in bed;with call bell/phone within reach (on stretcher in ED)  Nurse Communication Mobility status  PT Assessment  PT Recommendation/Assessment Patient needs continued PT services  PT Visit Diagnosis History of falling (Z91.81);Muscle weakness (generalized) (M62.81)  PT Problem List Decreased strength;Decreased range of motion;Decreased activity tolerance;Decreased balance;Decreased mobility;Decreased coordination;Decreased cognition;Decreased knowledge of use of DME;Decreased safety awareness;Decreased knowledge of precautions  PT Plan  PT Frequency (ACUTE ONLY) Min 2X/week  PT Treatment/Interventions (ACUTE ONLY) DME instruction;Functional mobility training;Gait training;Therapeutic activities;Therapeutic exercise;Cognitive remediation;Patient/family education;Balance training  AM-PAC PT "6 Clicks" Mobility Outcome Measure (Version 2)  Help needed turning from your back to your side while in a flat bed without using bedrails? 3  Help needed moving from lying on your back to sitting on the side of a flat bed without  using bedrails? 2  Help needed moving to and from a bed to a chair (including a wheelchair)? 2  Help needed standing up from a chair using your arms (e.g., wheelchair or bedside chair)? 2  Help needed to walk in hospital room? 2  Help needed climbing 3-5 steps with a railing?  1  6 Click Score 12  Consider Recommendation of Discharge To: CIR/SNF/LTACH  Progressive Mobility  What is the highest level of mobility based on the progressive mobility assessment? Level 1 (Bedfast) - Unable to balance while sitting on edge of bed  Mobility Sit up in bed/chair position for meals  PT Recommendation  Follow Up Recommendations SNF (return to [facility])  PT equipment None recommended by PT (defer to SNF)  Individuals Consulted  Consulted and Agree with Results and Recommendations Patient  Acute Rehab PT Goals  PT Goal Formulation Patient unable to participate in goal setting  Time For Goal Achievement 01/17/21  Potential to Achieve Goals Fair  PT Time Calculation  PT Start Time (ACUTE ONLY) 0925  PT Stop Time (ACUTE ONLY) 0948  PT Time Calculation (min) (ACUTE ONLY) 23 min  PT General Charges  $$ ACUTE PT VISIT 1 Visit  PT Evaluation  $PT Eval Moderate Complexity 1 Mod   Pt presents with the impairments and problems listed above. Pt generally confused and asking multiple questions repeatedly. Pt somewhat resistive to therapy but was able to sit up with mod assist at EOB. Pt limited by resistiveness. Recommending return to SNF at discharge. We will continue to follow her acutely to promote increased independence with functional mobility.  Dawayne Cirri, SPT 01/03/2021 10:41 AM

## 2021-01-03 NOTE — Progress Notes (Signed)
01/03/21 0957  PT Visit Information  Last PT Received On 01/03/21  Assistance Needed +1  History of Present Illness Pt is a 87yo female presenting to [facility] ED on 8/3 from [facility] due to lethargy, R eye droop, and R sided weakness. Imaging with no acute findings. Possibly secondary to acute metabolic encephalopathy (of note, recently admitted in early July for the same). PMH: hypothyroidism, HTN, history of stroke with related speech impairment, dementia, breast cancer, CAD s/p CABG.  Precautions  Precautions Fall  Restrictions  Weight Bearing Restrictions No  Home Living  Family/patient expects to be discharged to: Skilled nursing facility  Additional Comments From [facility] [employer]  Prior Function  Level of Independence Needs assistance  Comments Pt unable to give history secondary to dementia, but likely requires assist for ADLs and mobility tasks  Communication  Communication HOH  Pain Assessment  Pain Assessment Faces  Faces Pain Scale 2  Pain Location Left distal LE, left upper arm region, "my back"  Pain Descriptors / Indicators Discomfort  Pain Intervention(s) Monitored during session;Repositioned  Cognition  Arousal/Alertness Awake/alert  Behavior During Therapy Impulsive  Overall Cognitive Status History of cognitive impairments - at baseline  General Comments Pt asking many questions, especially "why are you doing that?" disoriented to place and time (required cues for month); assume this to be at baseline. Easily distractable and at times resistive to therapy.  Upper Extremity Assessment  Upper Extremity Assessment Defer to OT evaluation  Lower Extremity Assessment  Lower Extremity Assessment Generalized weakness;Difficult to assess due to impaired cognition  Cervical / Trunk Assessment  Cervical / Trunk Assessment Normal  Bed Mobility  Overal bed mobility Needs Assistance  Bed Mobility Supine to Sit;Sit to Supine  Supine to sit Mod assist  Sit to supine Supervision   General bed mobility comments Pt made two successful attempts to sit upright with mod assist for trunk control but immediately returned to supine position stating "I'm done." Very impulsive and required max encouragement to participate.  Transfers  General transfer comment Unable to attempt  Ambulation/Gait  General Gait Details Unable to attempt  Balance  Overall balance assessment History of Falls;Needs assistance  Sitting-balance support Bilateral upper extremity supported;Feet unsupported (on stretcher)  Sitting balance-Leahy Scale Poor  Sitting balance - Comments Reliant on external support to maintain sitting  PT - End of Session  Equipment Utilized During Treatment Oxygen (2L via [place]; patient had [place] out of nose initially and did not like when PT readjusted)  Activity Tolerance Treatment limited secondary to agitation (Pt very resistive to therapies)  Patient left in bed;with call bell/phone within reach (on stretcher in ED)  Nurse Communication Mobility status  PT Assessment  PT Recommendation/Assessment Patient needs continued PT services  PT Visit Diagnosis History of falling (Z91.81);Muscle weakness (generalized) (M62.81)  PT Problem List Decreased strength;Decreased range of motion;Decreased activity tolerance;Decreased balance;Decreased mobility;Decreased coordination;Decreased cognition;Decreased knowledge of use of DME;Decreased safety awareness;Decreased knowledge of precautions  PT Plan  PT Frequency (ACUTE ONLY) Min 2X/week  PT Treatment/Interventions (ACUTE ONLY) DME instruction;Functional mobility training;Gait training;Therapeutic activities;Therapeutic exercise;Cognitive remediation;Patient/family education;Balance training  AM-PAC PT "6 Clicks" Mobility Outcome Measure (Version 2)  Help needed turning from your back to your side while in a flat bed without using bedrails? 3  Help needed moving from lying on your back to sitting on the side of a flat bed without  using bedrails? 2  Help needed moving to and from a bed to a chair (including a wheelchair)? 2  Help needed standing up from a chair using your arms (e.g., wheelchair or bedside chair)? 2  Help needed to walk in hospital room? 2  Help needed climbing 3-5 steps with a railing?  1  6 Click Score 12  Consider Recommendation of Discharge To: CIR/SNF/LTACH  Progressive Mobility  What is the highest level of mobility based on the progressive mobility assessment? Level 1 (Bedfast) - Unable to balance while sitting on edge of bed  Mobility Sit up in bed/chair position for meals  PT Recommendation  Follow Up Recommendations SNF (return to [facility])  PT equipment None recommended by PT (defer to SNF)  Individuals Consulted  Consulted and Agree with Results and Recommendations Patient  Acute Rehab PT Goals  PT Goal Formulation Patient unable to participate in goal setting  Time For Goal Achievement 01/17/21  Potential to Achieve Goals Fair  PT Time Calculation  PT Start Time (ACUTE ONLY) 0925  PT Stop Time (ACUTE ONLY) 0948  PT Time Calculation (min) (ACUTE ONLY) 23 min  PT General Charges  $$ ACUTE PT VISIT 1 Visit  PT Evaluation  $PT Eval Moderate Complexity 1 Mod  PT Treatments  $Therapeutic Activity 8-22 mins   Pt presents with the impairments and problems listed above. Pt generally confused and asking multiple questions repeatedly. Pt somewhat resistive to therapy but was able to sit up with mod assist at EOB. Pt limited by resistiveness. Recommending return to SNF at discharge. We will continue to follow her acutely to promote increased independence with functional mobility.   Dawayne Cirri, SPT

## 2021-01-03 NOTE — Plan of Care (Signed)
LTM eeg reviewed till 36. NO ictal-interictal abnormality noted.  Please review final report for details.  Natalie Bullock [name]

## 2021-01-03 NOTE — ED Notes (Signed)
EEG on their way to transport machine

## 2021-01-03 NOTE — ED Notes (Signed)
Pt kept coughing and choking so pt pulled up in bed and sat up.

## 2021-01-03 NOTE — ED Notes (Signed)
Pt will not let me draw blood.

## 2021-01-03 NOTE — ED Notes (Signed)
RN attempted to call EEG

## 2021-01-03 NOTE — Progress Notes (Signed)
Neurology Progress Note  Brief HPI: 87 y.o. female with PMHx of HTN, CAD s/p CABG, HLD, MI, paroxysmal AF not on AC due to high risk of falls, vitamin D deficiency, breast cancer s/p R mastectomy, and vascular dementia who presented to the ED 01/02/2021 as a Cose Stroke for evaluation of acute unresponsiveness with right gaze and right-sided neglect per EMS. On arrival, patient did have decreased responsiveness without gaze palsy or noted neglect and had rapid improvement in mental status back to baseline on evaluation 01/03/2021. No tPA was given due to patient improvement in mental status and CT imaging without concern for acute stroke or LVO (family deferred MRI imaging).  Subjective: No acute overnight events Patient with improvement in mental status overnight back to baseline.  Spoke with patient's son on the phone to discuss further interventions and he is agreeable to 24 hour EEG monitoring due to ongoing concern for a seizure etiology of multiple patient presentations.   Exam: Vitals:   01/03/21 0621 01/03/21 0842  BP:  (!) 127/54  Pulse:  (!) 53  Resp:  16  Temp: (!) 97.5 F (36.4 C)   SpO2:  100%   Gen: Sleeping in bed, in no acute distress Cardiac: Bradycardia on cardiac monitor, extremities warm without pedal edema Resp: non-labored breathing, no respiratory distress, SpO2 100% on telemetry Abd: soft, non-tender, non-distended  Neuro: Mental Status: She is sleeping initially but quickly wakes to voice. Poor attention noted. She states her name after repeat questioning but states "why do you want to know" with further orientation questioning. She eventually states that she does not know what kind of building she is in but that we are in [place], [place]. She states the year is [place] and when asked if it is 2022 she states "oh yeah". She is unable to correctly state the month or her age.  Speech is dysarthric, possibly due to edentulous state.  Patient is unable to provide a clear or  coherent history of present illness. She only briefly opens eyes to participate in naming objects of which she correctly names 3/4.  No evidence of aphasia or neglect noted.  Cranial Nerves: PERRL, EOMI, she will fixate and track examiner, facial sensation is intact and symmetric to light touch, face is symmetric resting and with movement, hearing is intact to loud voice, palate elevates symmetrically, head appears grossly midline, tongue protrudes midline Motor: Moves all extremities spontaneously with antigravity movement. 5/5 strength throughout.  Tone is normal, bulk is decreased throughout.  Sensory: Sensation to light touch is intact and symmetric throughout.  Gait: Deferred  Pertinent Labs: CBC    Component Value Date/Time   WBC 5.0 01/03/2021 0436   RBC 3.48 (L) 01/03/2021 0436   HGB 9.9 (L) 01/03/2021 0436   HCT 31.9 (L) 01/03/2021 0436   PLT 236 01/03/2021 0436   MCV 91.7 01/03/2021 0436   MCH 28.4 01/03/2021 0436   MCHC 31.0 01/03/2021 0436   RDW 17.2 (H) 01/03/2021 0436   LYMPHSABS 1.1 01/03/2021 0436   MONOABS 0.4 01/03/2021 0436   EOSABS 0.1 01/03/2021 0436   BASOSABS 0.0 01/03/2021 0436   CMP     Component Value Date/Time   NA 138 01/03/2021 0436   K 3.8 01/03/2021 0436   CL 108 01/03/2021 0436   CO2 22 01/03/2021 0436   GLUCOSE 73 01/03/2021 0436   BUN 16 01/03/2021 0436   CREATININE 0.68 01/03/2021 0436   CALCIUM 8.5 (L) 01/03/2021 0436   CALCIUM 9.5 09/13/2011 1520  PROT 6.1 (L) 01/03/2021 0436   ALBUMIN 3.0 (L) 01/03/2021 0436   AST 15 01/03/2021 0436   ALT 10 01/03/2021 0436   ALKPHOS 38 01/03/2021 0436   BILITOT 0.7 01/03/2021 0436   GFRNONAA >60 01/03/2021 0436   GFRAA >60 10/08/2017 1815   Imaging Reviewed:  CT-scan of the brain 01/02/2021: 1. No acute intracranial abnormality 2. ASPECTS is 10 3. Atrophy and chronic microvascular ischemic change.   CTA head and neck 01/02/2021: 1. Negative for intracranial large vessel occlusion or  significant stenosis. 2. Mild atherosclerotic disease in the carotid bifurcation bilaterally without flow limiting stenosis. No significant vertebral artery stenosis. Right vertebral artery ends in PICA.  Assessment: 87 y.o. female who presented to the ED for evaluation of acute onset decreased responsiveness, right-sided neglect, and right gaze preference per EMS. Patient has had a progressive cognitive and functional decline since she fell at home on 11/07/2020 and has lost her independence. On initial assessment patient was nonverbal without obvious gaze palsy and left > right spontaneous movements. On reassessment, patient was more awake and alert, verbalizing but was not oriented and was moving all extremities. Assessments varied with extremity strength per patient's cooperation. tPA was not given due to recent GIB and patient not a candidate for thrombectomy due to no LVO on vessel imaging. Ddx includes toxic / metabolic / infectious encephalopathy, seizure with post-ictal state, small stroke not visualized on CT imaging, or cerebral hypoperfusion without infarction. It is felt less likely that her mental status is related to stroke with rapid improvement in mental status.   Impression:  Acute, transient encephalopathy with decreased level of consciousness- resolved  Recommendations: - cEEG for 24 hours for seizure evaluation - Inpatient seizure precautions - MRI brain deferred by family at this time - Ativan '2mg'$  PRN for any seizure lasting > 5 minutes and notify neurology  Anibal Henderson, AGACNP-BC [facility] [phone]  NEUROHOSPITALIST ADDENDUM Performed a face to face diagnostic evaluation.   I have reviewed the contents of history and physical exam as documented by PA/ARNP/Resident and agree with above documentation.  I have discussed and formulated the above plan as documented. Edits to the note have been made as needed.  Impression/Key exam findings/Plan: This is her  second time presenting with obtunded mentation and hemiplegia and neglect with subsequent return to baseline. Will put her on cEEG for 24 hours to assess for epileptogenic abnormalities. Her prior rEEG from 12/03/20 was normal. Family is okay with this plan.  Donnetta Simpers, MD [facility] [phone]   If 7pm to 7am, please call on call as listed on AMION.

## 2021-01-03 NOTE — ED Notes (Signed)
Pt unwilling to participate in Neuro exam.

## 2021-01-03 NOTE — Progress Notes (Signed)
TR[facility] short progress note  Patient evaluated and chart reviewed.   Principal Problem:   Neurological abnormality - presenting from facility for lethargic, right sided gaze and aphasia  - neurology recommends continuous EEG until tomorrow and has started patient on Keppra  Active Problems:   Hyperlipidemia - cont Pravachol    Hypothyroidism - Synthroid    HTN (hypertension) - cont Lopressor    Dementia ([place]) - follow for behavioral disturbances  DNR  Debbe Odea, MD

## 2021-01-03 NOTE — Consult Note (Addendum)
Palliative Care Consult Note                            NOTE: Performed in the Emergency Department prior to admission to inpatient        Date: 01/03/2021   Patient Name: Natalie Bullock  DOB: [birth year]  MRN: [id number]  Age / Sex: 87 y.o., female  PCP: Seward Carol, MD Referring Physician: Debbe Odea, MD  Reason for Consultation: Establishing goals of care  HPI/Patient Profile: 87 y.o. female  with past medical history of HTN, CAD s/p CABG, HLD, MI, paroxysmal AF not on AC due to high risk of falls, vitamin D deficiency, breast cancer s/p R mastectomy, and vascular dementia admitted on 01/02/2021 with Cose Stroke for evaluation of acute unresponsiveness with right gaze and right-sided neglect per EMS. Noted decreased responsiveness on arrival, not a tPA candidate due to improvement in mental status and no objective evidence of acute CVA; family deferred MRI. Admitted for CVA vs. Seizure.  Recent admission on December 05, 2020 for acute confusion, right gaze, and left-sided neglect and was evaluated as a code stroke at that time. Work up at that time included EEG with moderate diffuse encephalopathy without seizures or epileptiform discharges, CTH without evidence of infarct, and vessel imaging without large or medium vessel occlusion. At that time her Vitamin B12, TSH, and RPR were normal but family requested that MRI not be completed.  Total of 3 admissions in the past 6 months. She was living independently, managing her own medications, and ambulating with a walker approximately 7 weeks ago. She sustained a fall on 11/03/2020 and was found confused with multiple skin abrasions. Per chart review, she returned to baseline mental status prior to discharge to a skilled nursing facility for further rehabilitation.  But according to the nephew patient's mental status did not return to baseline that was present prior to her fall while she was living  independently.  On 11/27/2020 she presented back to the ED for evaluation of rectal bleeding and was treated conservatively.  And her hemoglobin had dropped from 13.7-9.7.  Blood transfusion was considered but not given.  GI was consulted and deferred colonoscopy.  Per patient's nephew at bedside, patient has required significant assistance to complete her ADLs and has been progressively confused beyond baseline since her fall.   Apparent progressive decline in mental status and function ability/ADLs.  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)    Stroke ([place])    Vitamin D deficiency     Social History   Socioeconomic History   Marital status: Widowed    Spouse name: Not on file   Number of children: Not on file   Years of education: Not on file   Highest education level: Not on file  Occupational History   Not on file  Tobacco Use   Smoking status: Former    Packs/day:  1.00    Years: 4.00    Pack years: 4.00    Types: Cigarettes    Quit date: 06/02/1969    Years since quitting: 51.6   Smokeless tobacco: Never  Substance and Sexual Activity   Alcohol use: No   Drug use: No   Sexual activity: Never  Other Topics Concern   Not on file  Social History Narrative   Not on file   Social Determinants of Health   Financial Resource Strain: Not on file  Food Insecurity: Not on file  Transportation Needs: Not on file  Physical Activity: Not on file  Stress: Not on file  Social Connections: Not on file    Family History  Problem Relation Age of Onset    Alzheimer's disease Mother     Subjective:   This NP Walden Field reviewed medical records, received report from team, assessed the patient and then meet at the patient's bedside  to discuss diagnosis, prognosis, GOC, EOL wishes disposition and options.   Concept of Palliative Care was introduced as specialized medical care for people and their families living with serious illness.  If focuses on providing relief from the symptoms and stress of a serious illness.  The goal is to improve quality of life for both the patient and the family. Values and goals of care important to patient and family were attempted to be elicited.  Created space and opportunity for patient  and family to explore thoughts and feelings regarding current medical situation   Natural trajectory and expectations at EOL were discussed. Questions and concerns addressed. Patient  encouraged to call with questions or concerns.    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  She does not have many hobbies, although she does cook meals.  They describe her as "feisty" and states that she often bounces back.  Patient Values: Unable to elicit at this time due to mental status changes and distractibility  Patient/Family Understanding of Illness: Unable to elicit at this time due to mental status changes and distractibility. Discussed with nephew/HCPOA Natalie Bullock. He understands she is in a downward trajectory heading toward end of life. His goal is avoidance of aggressive/uncomfortable/unnecessary measures. He is accepting of the patient's ongoing decline.  Review of Systems  Respiratory:  Negative for cough and shortness of breath.   Gastrointestinal:  Negative for abdominal distention and abdominal pain.  Musculoskeletal:        Left shoulder discomfort   Objective:   Primary Diagnoses: Present  on Admission:  Syncope  Hypothyroidism  Hyperlipidemia  HTN (hypertension)  Acute metabolic encephalopathy   Scheduled Meds:  levothyroxine  50 mcg Oral QAC breakfast   metoprolol tartrate  25 mg Oral BID   pravastatin  20 mg Oral QHS    Continuous Infusions:  levETIRAcetam Stopped (01/03/21 0953)    PRN Meds: acetaminophen **OR** acetaminophen  Allergies  Allergen Reactions   Valium Shortness Of Breath and Other (See Comments)    "ALLERGIC," per [facility]   Codeine Other (See Comments)    "ALLERGIC," per [facility]   Crestor [Rosuvastatin Calcium] Other (See Comments)    "ALLERGIC," per [facility]   Doxycycline Other (See Comments)    "ALLERGIC," per [facility]   Ezetimibe-Simvastatin Other (See Comments)    Vytorin- "ALLERGIC," per [facility]   Lipitor [Atorvastatin Calcium] Other (See Comments)    "ALLERGIC," per [facility]   Sulfa Drugs  Cross Reactors Other (See Comments)    "ALLERGIC," per [facility]   Tequin Other (See Comments)    "ALLERGIC," per [facility]   Zocor [Simvastatin] Other (See Comments)    "ALLERGIC," per [facility]    Physical Exam Vitals and nursing note reviewed.  Constitutional:      General: She is not in acute distress.    Appearance: She is not toxic-appearing.     Comments: Easily arousable but declines to open eyes  HENT:     Head: Normocephalic and atraumatic.  Cardiovascular:     Rate and Rhythm: Normal rate.  Pulmonary:     Effort: Pulmonary effort is normal.  Abdominal:     General: Abdomen is flat. There is no distension.     Palpations: Abdomen is soft.     Tenderness: There is no abdominal tenderness.  Skin:    General: Skin is warm and dry.  Neurological:     Mental Status: She is oriented to person, place, and time.    Vital Signs:  BP (!) 127/54   Pulse (!) 53   Temp (!) 97.5 F (36.4 C) (Oral)   Resp 16   SpO2 100%  Pain Scale: Faces      SpO2: SpO2: 100 % O2 Device:SpO2: 100 % O2 Flow Rate: .O2 Flow Rate (L/min): 2 L/min  IO: Intake/output summary:   Intake/Output Summary (Last 24 hours) at 01/03/2021 1229 Last data filed at 01/02/2021 1957 Gross per 24 hour  Intake 100 ml  Output --  Net 100 ml    LBM:   Baseline Weight:   Most recent weight:        Palliative Assessment/Data: 50-60%   Advanced Care Planning:   Primary Decision Maker: PATIENT and HCPOA  Code Status/Advance Care Planning: DNR  A discussion was had today regarding advanced directives. Concepts specific to code status, artifical feeding and hydration, continued IV antibiotics and rehospitalization was had.  The difference between a aggressive medical intervention path and a palliative comfort care path for this patient at this time was had. The MOST form was completed at a previous hospitalization and is available in the EMR/Vynca.  I spoke with her [name] who is her HCPOA (see: Vnca for documentation). He agrees the keep her comfortable, treat the treatable, no aggressive/invasive/heroic measures. Ok with EEG monitoring ("if she's been having seizures and a medicine can help prent those, that will help keep her comfortable.") Unfortunately [name] tested COVID+ 5 days ago and is not able to visit due to this (minor symptoms). Discussed we would keep in touch and follow as needed.  Decisions/Changes to ACP: No changes Remain DNR Decisions per MOST form  Assessment & Plan:   I have reviewed the medical record, interviewed the patient and family, and examined the patient. The following aspects are pertinent.  Impression: 24 87 year old female known to our service from previous admissions.  She was admitted for code stroke versus seizure activity.  No objective evidence of stroke, not a tPA candidate.  Her mental status has improved significantly overnight.  She now seems to be more in line with her recent baseline.  Undergoing EEG currently.  She failed bedside swallow eval and awaiting speech-language pathology swallow evaluation and remains n.p.o.  in the interim.  Wishes and goals are known as per documentation in Vynca with [facility] POA documentation, MOST form completed.  SUMMARY OF RECOMMENDATIONS   Continue to treat the treatable Avoid aggressive/invasive procedures (MRI, surgical, etc.) Okay with hospitalization, fluids, antibiotics,  medications Overarching goal for her care is comfort and decrease of symptoms  Symptom Management:  Per primary team and neurology Palliative medicine is available as needed for symptom management  Palliative Prophylaxis:  Delirium Protocol and Frequent Pain Assessment  Additional Recommendations (Limitations, Scope, Preferences): Full scope other than avoid aggressive measures Can contact [name] for any questions on specific tests/treatments  Psycho-social/Spiritual:  Desire for further Chaplaincy support: no Additional Recommendations: Caregiving  Support/Resources  Prognosis:  Unable to determine  Discharge Planning:  [facility] for rehab with Palliative care service follow-up   Discussed with: Nursing, patient's HCPOA, Dr. Wynelle Cleveland    Thank you for allowing [place] to participate in the care of Natalie Bullock PMT will continue to support holistically.  Time In: 9:30 am Time Out: 10:45 am Time Total: 75 min  Greater than 50%  of this time was spent counseling and coordinating care related to the above assessment and plan.  Signed by: Walden Field, NP Palliative Medicine Team  Team Phone # [phone] (Nights/Weekends)  01/03/2021, 12:29 PM

## 2021-01-03 NOTE — ED Notes (Signed)
Pt resting comfortably. Respirations even and unlabored.  

## 2021-01-04 DIAGNOSIS — G9341 Metabolic encephalopathy: Secondary | ICD-10-CM | POA: Diagnosis not present

## 2021-01-04 DIAGNOSIS — I10 Essential (primary) hypertension: Secondary | ICD-10-CM | POA: Diagnosis not present

## 2021-01-04 DIAGNOSIS — E43 Unspecified severe protein-calorie malnutrition: Secondary | ICD-10-CM | POA: Insufficient documentation

## 2021-01-04 MED ORDER — ADULT MULTIVITAMIN W/MINERALS CH
1.0000 | ORAL_TABLET | Freq: Every day | ORAL | Status: DC
  Filled 2021-01-04: qty 1

## 2021-01-04 MED ORDER — DIPHENHYDRAMINE HCL 50 MG/ML IJ SOLN
12.5000 mg | Freq: Once | INTRAMUSCULAR | Status: AC
  Administered 2021-01-04: 12.5 mg via INTRAVENOUS
  Filled 2021-01-04: qty 1

## 2021-01-04 MED ORDER — ENSURE ENLIVE PO LIQD: 237.0000 mL | Freq: Two times a day (BID) | ORAL | 12 refills | Status: AC

## 2021-01-04 MED ORDER — ADULT MULTIVITAMIN W/MINERALS CH: 1.0000 | ORAL_TABLET | Freq: Every day | ORAL | 3 refills | Status: AC

## 2021-01-04 MED ORDER — ENSURE ENLIVE PO LIQD
237.0000 mL | Freq: Two times a day (BID) | ORAL | Status: DC
  Administered 2021-01-04: 237 mL via ORAL

## 2021-01-04 MED ORDER — METOPROLOL TARTRATE 25 MG PO TABS: 12.5000 mg | ORAL_TABLET | Freq: Two times a day (BID) | ORAL | 1 refills | Status: AC

## 2021-01-04 MED ORDER — ASPIRIN EC 81 MG PO TBEC: 81.0000 mg | DELAYED_RELEASE_TABLET | Freq: Every day | ORAL | 2 refills | Status: AC

## 2021-01-04 NOTE — Progress Notes (Signed)
Neuro update and signout note:  EEG with mild diffuse encephalopathy which is nonspecific in etiology.  No seizures or epileptiform discharges were seen.  No further inpatient work-up recommended at this time.  I do not have a clear etiology for her episodes of sudden onset encephalopathy with hemiplegia/neglect with spontaneous resolution.  EEG has not captured any seizures or epileptiform discharges.  Neurology inpatient team will sign off.  Please feel free to contact us with any questions or concerns.  Plan discussed with Dr. Denton Brick.  [place] Pager Number [phone]

## 2021-01-04 NOTE — Progress Notes (Signed)
SLP Cancellation Note  Patient Details Name: Natalie Bullock MRN: [id number] DOB: 1924/10/11   Cancelled treatment:   Pt passed Yale swallow, therefore formal SLP swallow eval not warranted per protocol.  Our service will sign off.  Maximilien Hayashi L. Tivis Ringer, [place] Office number [phone] Pager [phone]         [name] 01/04/2021, 7:52 AM

## 2021-01-04 NOTE — TOC Transition Note (Signed)
Transition of Care [facility]) - CM/SW Discharge Note   Patient Details  Name: AVIONA CLUTE MRN: [id number] Date of Birth: 01-31-1925  [facility] CM/SW Contact:  Geralynn Ochs, LCSW Phone Number: 01/04/2021, 2:34 PM   Clinical Narrative:   Nurse to call report to [phone].    Final next level of care: Skilled Nursing Facility Barriers to Discharge: Barriers Resolved   Patient Goals and CMS Choice Patient states their goals for this hospitalization and ongoing recovery are:: patient unable to participate in goal setting, disoriented CMS Medicare.gov Compare Post Acute Care list provided to:: Patient Represenative (must comment) Choice offered to / list presented to : [place] / [place]  Discharge Placement              Patient [facility] bed at: [facility] Patient to be transferred to facility by: [facility] Name of family member notified: [name] Patient and family notified of of transfer: 01/04/21  Discharge Plan and Services                                     Social Determinants of Health (SDOH) Interventions     Readmission Risk Interventions No flowsheet data found.

## 2021-01-04 NOTE — Progress Notes (Signed)
LTM EEG discontinued - no skin breakdown at unhook.   

## 2021-01-04 NOTE — Progress Notes (Signed)
Initial Nutrition Assessment  DOCUMENTATION CODES:   Severe malnutrition in context of chronic illness  INTERVENTION:  -Recommend liberalizing diet to regular -Ensure Enlive po BID, each supplement provides 350 kcal and 20 grams of protein -MVI with minerals daily   NUTRITION DIAGNOSIS:   Severe Malnutrition related to chronic illness (dementia) as evidenced by severe fat depletion, severe muscle depletion.  GOAL:   Patient will meet greater than or equal to 90% of their needs  MONITOR:   PO intake, Supplement acceptance, Labs, Weight trends, I & O's  REASON FOR ASSESSMENT:   Consult Assessment of nutrition requirement/status  ASSESSMENT:   Pt with PMH significant of Afib, HLD, ischemic heart disease, CAD s/p CABG, HTN, previous MI, dementia, and hypothyroidism admitted for syncope vs seizure.  EEG with mild diffuse encephalopathy nonspecific in etiology, no seizures/epileptiform discharges seen. Neurology signed off.    Attempted to obtain diet/weight history from pt, but pt provided no intelligible responses. Discussed with RN.   No PO intake documented.   Labs and medications reviewed.   NUTRITION - FOCUSED PHYSICAL EXAM:  Flowsheet Row Most Recent Value  Orbital Region Severe depletion  Upper Arm Region Severe depletion  Thoracic and Lumbar Region Severe depletion  Buccal Region Severe depletion  Temple Region Severe depletion  Clavicle Bone Region Severe depletion  Clavicle and Acromion Bone Region Severe depletion  Scapular Bone Region Severe depletion  Dorsal Hand Severe depletion  Patellar Region Severe depletion  Anterior Thigh Region Severe depletion  Posterior Calf Region Severe depletion  Edema (RD Assessment) None  Hair Reviewed  Eyes Reviewed  Mouth Reviewed  Skin Reviewed  Nails Reviewed       Diet Order:   Diet Order             Diet Heart Room service appropriate? Yes; Fluid consistency: Thin  Diet effective now                    EDUCATION NEEDS:   Not appropriate for education at this time  Skin:  Skin Assessment: Reviewed RN Assessment  Last BM:  8/4  Height:   Ht Readings from Last 1 Encounters:  11/27/20 '5\' 3"'$  (1.6 m)    Weight:   Wt Readings from Last 1 Encounters:  12/06/20 54.9 kg    BMI:  There is no height or weight on file to calculate BMI.  Estimated Nutritional Needs:   Kcal:  1450-1650  Protein:  70-85 grams  Fluid:  >1.45L/d    Larkin Ina, MS, RD, LDN (she/her/hers) RD pager number and weekend/on-call pager number located in [place].

## 2021-01-04 NOTE — Progress Notes (Signed)
Notified patient has pulled leads off, second time.

## 2021-01-04 NOTE — Progress Notes (Signed)
[facility] ([facility])  Hospital Liaison: RN note         This patient has been referred to our palliative care services in the community.  [facility] will continue to follow for any discharge planning needs and to coordinate continuation of palliative care in the outpatient setting.    If you have questions or need assistance, please call [phone] or contact the hospital Liaison listed on AMION.      Thank you for this referral.         Mary Anne Robertson, RN, CCM  [facility] Hospital Liaison   [phone] 

## 2021-01-04 NOTE — Progress Notes (Signed)
Occupational Therapy Treatment Patient Details Name: Natalie Bullock MRN: [id number] DOB: 12-23-1924 Today's Date: 01/04/2021    History of present illness Pt is a 87yo female presenting to [facility] ED on 8/3 from [facility] due to lethargic, R eye droop, and R sided weakness. concern for stroke. Imaging with no acute findings. Of note, pt recently admitted 7/7-7/8 for acute metabolic encephalopathy. PMH: hypothyroidism, HTN, history of stroke with related speech impairment, dementia, remote history of cancer, CAD s/p CABG.   OT comments  Patient seen for OOB and transfer status this date.  Patient with up to Mod A for sit to stand.  She was unable to achieve full stand and unable to get her weight off of her heels.  Max A for squat pivot to [facility] and back.  ADL assessment not completed as patient requesting to lie down after transfer.  Continue to recommend SNF for 24 hour care.    Follow Up Recommendations  SNF    Equipment Recommendations  None recommended by OT    Recommendations for Other Services      Precautions / Restrictions Precautions Precautions: Fall Restrictions Other Position/Activity Restrictions: hand mittens       Mobility Bed Mobility Overal bed mobility: Needs Assistance Bed Mobility: Supine to Sit;Sit to Supine     Supine to sit: Mod assist Sit to supine: Mod assist     Patient Response: Cooperative  Transfers Overall transfer level: Needs assistance Equipment used: Rolling walker (2 wheeled) Transfers: Sit to/from [employer] Transfers Sit to Stand: Mod assist Stand pivot transfers: +2 physical assistance;Mod assist Squat pivot transfers: Max assist          Balance Overall balance assessment: Needs assistance Sitting-balance support: Feet unsupported;Bilateral upper extremity supported Sitting balance-Leahy Scale: Fair     Standing balance support: Bilateral upper extremity supported Standing balance-Leahy Scale:  Poor Standing balance comment: unable to get her weight forward for transfers.                                                Cognition Arousal/Alertness: Awake/alert Behavior During Therapy: Restless Overall Cognitive Status: History of cognitive impairments - at baseline                                                            Pertinent Vitals/ Pain       Pain Assessment: Faces Faces Pain Scale: Hurts a little bit Pain Location: Chronic back pain Pain Descriptors / Indicators: Sore;Tightness Pain Intervention(s): Monitored during session                                                          Frequency  Min 2X/week        Progress Toward Goals  OT Goals(current goals can now be found in the care plan section)  Progress towards OT goals: Progressing toward goals  Acute Rehab OT Goals Patient Stated Goal: patient did not state OT Goal Formulation: With patient Time  For Goal Achievement: 01/17/21 Potential to Achieve Goals: [place] Discharge plan remains appropriate    Co-evaluation                 AM-PAC OT "6 Clicks" Daily Activity     Outcome Measure   Help from another person eating meals?: A Little Help from another person taking care of personal grooming?: A Little Help from another person toileting, which includes using toliet, bedpan, or urinal?: A Lot Help from another person bathing (including washing, rinsing, drying)?: A Lot Help from another person to put on and taking off regular upper body clothing?: A Little Help from another person to put on and taking off regular lower body clothing?: A Lot 6 Click Score: 15    End of Session Equipment Utilized During Treatment: Gait belt;Rolling walker  OT Visit Diagnosis: Unsteadiness on feet (R26.81);Muscle weakness (generalized) (M62.81);History of falling (Z91.81);Other symptoms and signs involving cognitive function;Pain    Activity Tolerance Patient tolerated treatment well   Patient Left in bed;with call bell/phone within reach;with bed alarm set   Nurse Communication          Time: [id number] OT Time Calculation (min): 19 min  Charges: OT General Charges $OT Visit: 1 Visit OT Treatments $Therapeutic Activity: 8-22 mins  01/04/2021  Rich, OTR/L  Acute Rehabilitation Services  Office:  [place] 01/04/2021, 2:03 PM

## 2021-01-04 NOTE — Progress Notes (Signed)
LTM maint complete - no skin breakdown seen, however pt pulled hair out where the leads were attached. Patient complained of itching, nursing staff informed. New set of leads placed on patient, with chin strap head wrap..  Mittens reapplied by nursing staff.

## 2021-01-04 NOTE — Discharge Instructions (Signed)
1)Avoid ibuprofen/Advil/Aleve/Motrin/Goody Powders/Naproxen/BC powders/Meloxicam/Diclofenac/Indomethacin and other Nonsteroidal anti-inflammatory medications as these will make you more likely to bleed and can cause stomach ulcers, can also cause Kidney problems.   2)Repeat CBC within a week

## 2021-01-04 NOTE — NC FL2 (Signed)
[place] LEVEL OF CARE SCREENING TOOL     IDENTIFICATION  Patient Name: Natalie Bullock Birthdate: 07/18/1924 Sex: female Admission Date (Current Location): 01/02/2021  [facility] and [place] Number:  Herbalist and Address:  [facility] [place] [place] [address] [place], [place] [address]      Provider Number: [id number]  Attending Physician Name and Address:  Roxan Hockey, MD  Relative Name and Phone Number:       Current Level of Care: Hospital Recommended Level of Care: [place] Prior Approval Number:    Date Approved/Denied:   PASRR Number:    Discharge Plan: SNF    Current Diagnoses: Patient Active Problem List   Diagnosis Date Noted   Neurological abnormality 01/03/2021   Dementia ([place]) 01/03/2021   Neurologic abnormality 01/03/2021   Acute encephalopathy 01/02/2021   Memory loss    Goals of care, counseling/discussion    Palliative care by specialist    GI bleed 11/27/2020   Encephalopathy 11/03/2020   Fall    Orthostasis    Mitral regurgitation 03/07/2014   Near syncope 03/26/2013   Delirium 09/12/2011   HTN (hypertension) 08/17/2011   HX: breast cancer 08/14/2011   Hypothyroidism 08/14/2011   Hx of CABG 11/19/2010   Hyperlipidemia 11/19/2010    Orientation RESPIRATION BLADDER Height & Weight      (disoriented x 4)  Normal Incontinent Weight:   Height:     BEHAVIORAL SYMPTOMS/MOOD NEUROLOGICAL BOWEL NUTRITION STATUS      Incontinent Diet (see DC summary)  AMBULATORY STATUS COMMUNICATION OF NEEDS Skin   Extensive Assist Verbally Normal                       Personal Care Assistance Level of Assistance  Bathing, Feeding, Dressing Bathing Assistance: Maximum assistance Feeding assistance: Maximum assistance Dressing Assistance: Maximum assistance     Functional Limitations Info             SPECIAL CARE FACTORS FREQUENCY                       Contractures  Contractures Info: Not present    Additional Factors Info  Code Status, Allergies Code Status Info: DNR Allergies Info: Valium, Codeine, Crestor (Rosuvastatin Calcium), Doxycycline, Ezetimibe-simvastatin, Lipitor (Atorvastatin Calcium), Sulfa Drugs Cross Reactors, Tequin, Zocor (Simvastatin)           Current Medications (01/04/2021):  This is the current hospital active medication list Current Facility-Administered Medications  Medication Dose Route Frequency Provider Last Rate Last Admin   acetaminophen (TYLENOL) tablet 650 mg  650 mg Oral Q6H PRN Doutova, Anastassia, MD       Or   acetaminophen (TYLENOL) suppository 650 mg  650 mg Rectal Q6H PRN Doutova, Anastassia, MD       levETIRAcetam (KEPPRA) IVPB 1500 mg/ 100 mL premix  1,500 mg Intravenous Q12H Doutova, Anastassia, MD 400 mL/hr at 01/04/21 1034 1,500 mg at 01/04/21 1034   levothyroxine (SYNTHROID) tablet 50 mcg  50 mcg Oral QAC breakfast Toy Baker, MD   50 mcg at 01/04/21 0525   metoprolol tartrate (LOPRESSOR) tablet 25 mg  25 mg Oral BID Doutova, Anastassia, MD   25 mg at 01/04/21 1030   pravastatin (PRAVACHOL) tablet 20 mg  20 mg Oral QHS Toy Baker, MD   20 mg at 01/03/21 2127     Discharge Medications: Please see discharge summary for a list of discharge medications.  Relevant Imaging Results:  Relevant Lab Results:   Additional Information SS#: [id number]  Geralynn Ochs, LCSW

## 2021-01-04 NOTE — Discharge Summary (Signed)
Natalie Bullock, is a 87 y.o. female  DOB 1925-01-09  MRN [id number].  Admission date:  01/02/2021  Admitting Physician  Debbe Odea, MD  Discharge Date:  01/04/2021   Primary MD  Seward Carol, MD  Recommendations for primary care physician for things to follow:   1)Avoid ibuprofen/Advil/Aleve/Motrin/Goody Powders/Naproxen/BC powders/Meloxicam/Diclofenac/Indomethacin and other Nonsteroidal anti-inflammatory medications as these will make you more likely to bleed and can cause stomach ulcers, can also cause Kidney problems.   2)Repeat CBC within a week   Admission Diagnosis  Seizure ([place]) [R56.9] Acute encephalopathy [G93.40] Neurologic abnormality [R29.818]   Discharge Diagnosis  Seizure ([place]) [R56.9] Acute encephalopathy [G93.40] Neurologic abnormality [R29.818]    Principal Problem:   Neurological abnormality Active Problems:   Hyperlipidemia   Hypothyroidism   HTN (hypertension)   Dementia (H[facility])   Neurologic abnormality   Protein-calorie malnutrition, severe      Past Medical History:  Diagnosis Date   Anemia    Arthritis    osteoarthritis   Breast cancer ([place]) 06/02/1973   Right sided mastectomy   Coronary artery disease    H/O hypokalemia    Hx of CABG    pt states she had 5 bypasses   Hyperlipidemia    Hypertension    Hypothyroidism    IHD (ischemic heart disease)    with CABG in August 2002   Myocardial infarction [facility])    Non Q-wave MI   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 EF 60-70%   CARDIOVASCULAR STRESS TEST  Aug 2010   Normal; EF 74%   CORONARY ARTERY BYPASS GRAFT  12/2000   LEFT INTERNAL MAMMARY TO THE LAD, SAPHENOUS VEIN GRAFT TO  THE FIRST DIAGONAL, SAPHENOUS VEIN GRAFT TO THE RAMUS INTERMEDIATE, AND A SEQUENTIAL SAPHENOUS VEIN GRAFT TO THE POSTERIOR DESCENDING AND POSTERIOR LATERAL BRANCHES.   MASS EXCISION  04/08/2011   Procedure: EXCISION MASS;  Surgeon: Wynonia Sours, MD;  Location: [facility];  Service: Orthopedics;  Laterality: Left;  excision mass left palm   MASTECTOMY  1975   RIGHT SIDE   TUBAL LIGATION     1960's      HPI  from the history and physical done on the day of admission:   HPI: Natalie Bullock is a 87 y.o. female with medical history significant of chronic afib, hyperlipidemia, ischemic heart disease, CAD sp CABG hypertension, and previous MI, dementia, hypothyroidism     Presented WITH CONFUSION  LKW at 1715 and now complaining of increased confusion worse than her baseline. Noted to have eye deviation to the right suspected seizure  Patient is not a candidate for tPA due to low suspicion of stroke Family did not want MRI   Patient was living independently  ambulating with a walker approximately 7 weeks ago. She fell on 11/03/2020 and was found confused with multiple skin abrasions. Per chart review, she returned to baseline mental status prior to discharge to a skilled nursing facility for further rehabilitation.  according to the nephew patient's mental status did not return to baseline that was present prior to her fall while she was living independently.  On 11/27/2020 she went back to the ED for evaluation of rectal bleeding and was treated conservatively.  And her hemoglobin had dropped no blood was given.  GI was consulted and deferred colonoscopy.  Per patient's nephew at bedside, patient has required significant assistance to complete her ADLs and has been progressively confused beyond baseline since her fall.       Hospital Course:     Brief Summary:- 87 y.o. female  with past medical history of HTN, CAD s/p CABG, HLD, MI, paroxysmal AF not on AC due to high risk of falls,  vitamin D deficiency, breast cancer s/p R mastectomy, and vascular dementia admitted on 01/02/2021 with [place] Stroke for evaluation of acute unresponsiveness with right gaze and right-sided neglect per EMS. Noted decreased responsiveness on arrival, not a tPA candidate due to improvement in mental status and no objective evidence of acute CVA; family deferred MRI. Admitted for CVA vs. Syncope Vs Seizure.   A/p 1)Syncope  Vs CVA Vs Seizures-- -EKG from 01/02/2021 with sinus rhythm with PVCs, as well as LVH with repull abnormalities -It appears that the troponin was requested on admission but was not controlled to draw -Remains chest pain-free -CTA neck and head without acute findings -CT head without contrast also without acute findings -Echo from June 2022 with EF of 55 to 60% -EEG including continuous EEG monitoring without epileptiform findings -Neurology consult and input appreciated -Discussed risk versus benefits of antiseizure medications with patient's nephew Mr. [name] [phone]--- Nephew prefers to hold off on Keppra or other antiseizure medications for now given negative neuro work-up -Family declined MRI brain  2)Hypothyroidism--- TSH is 1.78, c/n levothyroxine  3)HTN--decrease metoprolol to 12.5 twice daily from 25 mg twice daily due to concerns about possible bradycardia  4)Dementia with superimposed acute metabolic encephalopathy--- appears back to baseline, continue supportive care --Family declined MRI brain  5) history of CAD status post prior CABG --- chest pain-free, continue pravastatin aspirin and metoprolol   6) chronic anemia--- H&H appears stable, repeat CBC in a week  7)PAFib--stable, metoprolol 12.5 mg twice daily as above #3, -Patient apparently is not on anticoagulation due to concerns about fall risk and chronic anemia with increased risk for bleeding  8)Social/Ethics--- palliative care consult appreciated -Patient is a DNR/DNI -Discussed risk versus  benefits of antiseizure medications with patient's nephew Mr. [name] [phone]--- Nephew prefers to hold off on Keppra or other antiseizure medications for now given negative neuro work-up  Discharge Condition: stable   Follow UP   Contact information for after-discharge care     Destination     [facility] Preferred SNF .   Service: Skilled Nursing Contact information: [address] [place] [phone]                      Consults obtained - neuro and palliative  Diet and Activity recommendation:  As advised  Discharge Instructions    Discharge Instructions     Call MD for:  difficulty breathing, headache or visual disturbances   Complete by: As directed  Call MD for:  persistant dizziness or light-headedness   Complete by: As directed    Call MD for:  persistant nausea and vomiting   Complete by: As directed    Call MD for:  severe uncontrolled pain   Complete by: As directed    Call MD for:  temperature >100.4   Complete by: As directed    Diet - low sodium heart healthy   Complete by: As directed    Discharge instructions   Complete by: As directed    1)Avoid ibuprofen/Advil/Aleve/Motrin/Goody Powders/Naproxen/BC powders/Meloxicam/Diclofenac/Indomethacin and other Nonsteroidal anti-inflammatory medications as these will make you more likely to bleed and can cause stomach ulcers, can also cause Kidney problems.   2)Repeat CBC within a week   Increase activity slowly   Complete by: As directed        Discharge Medications     Allergies as of 01/04/2021       Reactions   Valium Shortness Of Breath, Other (See Comments)   "ALLERGIC," per [facility]   Codeine Other (See Comments)   "ALLERGIC," per [facility]   Crestor [rosuvastatin Calcium] Other (See Comments)   "ALLERGIC," per [facility]   Doxycycline Other (See Comments)   "ALLERGIC," per [facility]   Ezetimibe-simvastatin Other (See Comments)   Vytorin-  "ALLERGIC," per MAR   Lipitor [atorvastatin Calcium] Other (See Comments)   "ALLERGIC," per [facility]   Sulfa Drugs Cross Reactors Other (See Comments)   "ALLERGIC," per [facility]   Tequin Other (See Comments)   "ALLERGIC," per [facility]   Zocor [simvastatin] Other (See Comments)   "ALLERGIC," per [facility]        Medication List     TAKE these medications    acetaminophen 325 MG tablet Commonly known as: TYLENOL Take 650 mg by mouth 3 (three) times daily.   aspirin EC 81 MG tablet Take 1 tablet (81 mg total) by mouth daily with breakfast.   Biofreeze 4 % Gel Generic drug: Menthol (Topical Analgesic) Apply 1 application topically every 6 (six) hours as needed (left and right shoulder pain).   bisacodyl 10 MG suppository Commonly known as: DULCOLAX Place 10 mg rectally See admin instructions. Qd prn  constipation if no relief from milk of mag   feeding supplement Liqd Take 237 mLs by mouth 2 (two) times daily between meals.   furosemide 20 MG tablet Commonly known as: LASIX Take 20 mg by mouth daily.   levothyroxine 50 MCG tablet Commonly known as: SYNTHROID Take 50 mcg by mouth daily before breakfast.   magnesium hydroxide 400 MG/5ML suspension Commonly known as: MILK OF MAGNESIA Take 30 mLs by mouth daily as needed for mild constipation.   metoprolol tartrate 25 MG tablet Commonly known as: LOPRESSOR Take 0.5 tablets (12.5 mg total) by mouth 2 (two) times daily. What changed: how much to take   multivitamin with minerals Tabs tablet Take 1 tablet by mouth daily.   pravastatin 20 MG tablet Commonly known as: PRAVACHOL Take 1 tablet (20 mg total) by mouth at bedtime.   RA SALINE ENEMA RE Place 1 enema rectally See admin instructions. Qd prn constipation if no relief from milk of mag or bisacodyl suppository   SYSTANE OP Place 2 drops into both eyes every morning.        Major procedures and Radiology Reports - PLEASE review detailed and final reports for all details, in  brief -   DG Chest Port 1 View  Result Date: 01/02/2021 CLINICAL DATA:  Altered mental status EXAM: PORTABLE  CHEST 1 VIEW COMPARISON:  11/03/2020 FINDINGS: Atherosclerotic calcification of the aortic arch. Prior CABG. Mild lingular scarring. Mildly low lung volumes. Prior right mastectomy. No blunting of the costophrenic angles. Mild thoracic spondylosis. IMPRESSION: 1. No acute thoracic findings. 2.  Aortic Atherosclerosis (ICD10-I70.0).  Prior CABG. 3. Chronic lingular scarring. Electronically Signed   By: Van Clines M.D.   On: 01/02/2021 19:20   EEG adult  Result Date: 12/05/2020 Lora Havens, MD     12/05/2020  6:29 PM Patient Name: Natalie Bullock MRN: [id number] Epilepsy Attending: Lora Havens Referring Physician/Provider: Anibal Henderson, NP Date: 12/05/2020 Duration: 23.49 mins Patient history: 87 y.o. female who presented to the ED for evaluation of acute onset confusion, left-sided neglect, and right gaze palsy. EEG to evaluate for seizure. Level of alertness: Awake AEDs during EEG study: None Technical aspects: This EEG study was done with scalp electrodes positioned according to the 10-20 International system of electrode placement. Electrical activity was acquired at a sampling rate of '500Hz'$  and reviewed with a high frequency filter of '70Hz'$  and a low frequency filter of '1Hz'$ . EEG data were recorded continuously and digitally stored. Description: No posterior dominant rhythm was seen. EEG showed continuous generalized polymorphic mixed frequencies with predominantly 5 to 6 Hz theta as well as intermittent 2-'3hz'$  delta slowing admixed with 9-'10hz'$  generalized alpha activity. Hyperventilation and photic stimulation were not performed.   ABNORMALITY - Continuous slow, generalized IMPRESSION: This study is suggestive of moderate diffuse encephalopathy, nonspecific etiology. No seizures or epileptiform discharges were seen throughout the recording. Priyanka [name]   Overnight EEG with  video  Result Date: 01/04/2021 Lora Havens, MD     01/04/2021  9:59 AM Patient Name: Natalie Bullock MRN: [id number] Epilepsy Attending: Lora Havens Referring Physician/Provider: Anibal Henderson, NP Duration: 01/03/2021 1027 to 01/04/2021 1000 Patient history:  87 y.o. female who presented to the ED for evaluation of acute onset decreased responsiveness, right-sided neglect, and right gaze preference.  EEG to evaluate for seizures. Level of alertness: Awake, asleep AEDs during EEG study: LEV Technical aspects: This EEG study was done with scalp electrodes positioned according to the 10-20 International system of electrode placement. Electrical activity was acquired at a sampling rate of '500Hz'$  and reviewed with a high frequency filter of '70Hz'$  and a low frequency filter of '1Hz'$ . EEG data were recorded continuously and digitally stored. Description: The posterior dominant rhythm consists of 8 Hz activity of moderate voltage (25-35 uV) seen predominantly in posterior head regions, symmetric and reactive to eye opening and eye closing. Sleep was characterized by vertex waves, sleep spindles (12 to 14 Hz), maximal frontocentral region.  Intermittent generalized 3 to '5Hz'$  theta- delta slowing was also noted.  Hyperventilation and photic stimulation were not performed.   Parts of study were difficult to interpret due to significant electrode artifact as patient was not electrodes. ABNORMALITY - Intermittent slow, generalized IMPRESSION: This technically difficult study is suggestive of mild diffuse encephalopathy, nonspecific etiology. No seizures or epileptiform discharges were seen throughout the recording. Lora Havens   CT HEAD CODE STROKE WO CONTRAST  Result Date: 01/02/2021 CLINICAL DATA:  Code stroke.  Acute neuro deficit.  Aphasia EXAM: CT HEAD WITHOUT CONTRAST TECHNIQUE: Contiguous axial images were obtained from the base of the skull through the vertex without intravenous contrast. COMPARISON:  CT head  12/05/2020 FINDINGS: Brain: Generalized atrophy. Negative for hydrocephalus. Moderate white matter changes with patchy white matter hypodensity bilaterally, unchanged. Negative for acute infarct, hemorrhage,  mass Vascular: Negative for hyperdense vessel Skull: Negative Sinuses/Orbits: Negative Other: None ASPECTS (Stebbins Stroke Program Early CT Score) - Ganglionic level infarction (caudate, lentiform nuclei, internal capsule, insula, M1-M3 cortex): 7 - Supraganglionic infarction (M4-M6 cortex): 3 Total score (0-10 with 10 being normal): 10 IMPRESSION: 1. No acute intracranial abnormality 2. ASPECTS is 10 3. Atrophy and chronic microvascular ischemic change. 4. Code stroke imaging results were communicated on 01/02/2021 at 6:26 pm to provider Lorrin Goodell via text page Electronically Signed   By: Franchot Gallo M.D.   On: 01/02/2021 18:28   CT ANGIO HEAD CODE STROKE  Result Date: 01/02/2021 CLINICAL DATA:  Acute neuro deficit.  Aphasia EXAM: CT ANGIOGRAPHY HEAD AND NECK TECHNIQUE: Multidetector CT imaging of the head and neck was performed using the standard protocol during bolus administration of intravenous contrast. Multiplanar CT image reconstructions and MIPs were obtained to evaluate the vascular anatomy. Carotid stenosis measurements (when applicable) are obtained utilizing NASCET criteria, using the distal internal carotid diameter as the denominator. CONTRAST:  110m OMNIPAQUE IOHEXOL 350 MG/ML SOLN COMPARISON:  CT head 01/02/2021 FINDINGS: CTA NECK FINDINGS Aortic arch: Mild atherosclerotic disease aortic arch. Proximal great vessels widely patent. Right carotid system: Mild atherosclerotic calcification right carotid bifurcation. No significant stenosis. Left carotid system: Mild atherosclerotic calcification left carotid bulb. Negative for stenosis. Vertebral arteries: Left vertebral artery dominant. Both vertebral arteries patent to the skull base without stenosis. Skeleton: Mild cervical spondylosis.   No acute skeletal abnormality. Other neck: 10 mm right thyroid nodule. No further imaging necessary based on nodule size. No mass or adenopathy. Upper chest: Lung apices clear bilaterally. Review of the MIP images confirms the above findings CTA HEAD FINDINGS Anterior circulation: Atherosclerotic calcification in the cavernous carotid bilaterally. No flow limiting stenosis. Anterior and middle cerebral arteries patent bilaterally without stenosis. Posterior circulation: Left vertebral artery supplies the basilar. Mild calcific stenosis at the skull base. Left PICA patent. Right vertebral artery ends in PICA. Basilar widely patent. Superior cerebellar and posterior cerebral arteries patent. No large vessel occlusion or flow limiting stenosis. Venous sinuses: Normal venous enhancement. Anatomic variants: None Review of the MIP images confirms the above findings IMPRESSION: 1. Negative for intracranial large vessel occlusion or significant stenosis. 2. Mild atherosclerotic disease in the carotid bifurcation bilaterally without flow limiting stenosis. No significant vertebral artery stenosis. Right vertebral artery ends in PICA. Electronically Signed   By: CFranchot GalloM.D.   On: 01/02/2021 18:43   CT ANGIO NECK CODE STROKE  Result Date: 01/02/2021 CLINICAL DATA:  Acute neuro deficit.  Aphasia EXAM: CT ANGIOGRAPHY HEAD AND NECK TECHNIQUE: Multidetector CT imaging of the head and neck was performed using the standard protocol during bolus administration of intravenous contrast. Multiplanar CT image reconstructions and MIPs were obtained to evaluate the vascular anatomy. Carotid stenosis measurements (when applicable) are obtained utilizing NASCET criteria, using the distal internal carotid diameter as the denominator. CONTRAST:  1073mOMNIPAQUE IOHEXOL 350 MG/ML SOLN COMPARISON:  CT head 01/02/2021 FINDINGS: CTA NECK FINDINGS Aortic arch: Mild atherosclerotic disease aortic arch. Proximal great vessels widely  patent. Right carotid system: Mild atherosclerotic calcification right carotid bifurcation. No significant stenosis. Left carotid system: Mild atherosclerotic calcification left carotid bulb. Negative for stenosis. Vertebral arteries: Left vertebral artery dominant. Both vertebral arteries patent to the skull base without stenosis. Skeleton: Mild cervical spondylosis.  No acute skeletal abnormality. Other neck: 10 mm right thyroid nodule. No further imaging necessary based on nodule size. No mass or adenopathy. Upper chest: Lung apices clear  bilaterally. Review of the MIP images confirms the above findings CTA HEAD FINDINGS Anterior circulation: Atherosclerotic calcification in the cavernous carotid bilaterally. No flow limiting stenosis. Anterior and middle cerebral arteries patent bilaterally without stenosis. Posterior circulation: Left vertebral artery supplies the basilar. Mild calcific stenosis at the skull base. Left PICA patent. Right vertebral artery ends in PICA. Basilar widely patent. Superior cerebellar and posterior cerebral arteries patent. No large vessel occlusion or flow limiting stenosis. Venous sinuses: Normal venous enhancement. Anatomic variants: None Review of the MIP images confirms the above findings IMPRESSION: 1. Negative for intracranial large vessel occlusion or significant stenosis. 2. Mild atherosclerotic disease in the carotid bifurcation bilaterally without flow limiting stenosis. No significant vertebral artery stenosis. Right vertebral artery ends in PICA. Electronically Signed   By: Franchot Gallo M.D.   On: 01/02/2021 18:43    Micro Results   Recent Results (from the past 240 hour(s))  Resp Panel by RT-PCR (Flu A&B, Covid) Nasopharyngeal Swab     Status: None   Collection Time: 01/02/21  6:16 PM   Specimen: Nasopharyngeal Swab; Nasopharyngeal(NP) swabs in vial transport medium  Result Value Ref Range Status   SARS Coronavirus 2 by RT PCR NEGATIVE NEGATIVE Final     Comment: (NOTE) SARS-CoV-2 target nucleic acids are NOT DETECTED.  The SARS-CoV-2 RNA is generally detectable in upper respiratory specimens during the acute phase of infection. The lowest concentration of SARS-CoV-2 viral copies this assay can detect is 138 copies/mL. A negative result does not preclude SARS-Cov-2 infection and should not be used as the sole basis for treatment or other patient management decisions. A negative result may occur with  improper specimen collection/handling, submission of specimen other than nasopharyngeal swab, presence of viral mutation(s) within the areas targeted by this assay, and inadequate number of viral copies(<138 copies/mL). A negative result must be combined with clinical observations, patient history, and epidemiological information. The expected result is Negative.  Fact Sheet for Patients:  EntrepreneurPulse.com.au  Fact Sheet for Healthcare Providers:  IncredibleEmployment.be  This test is no t yet approved or cleared by the [place] FDA and  has been authorized for detection and/or diagnosis of SARS-CoV-2 by FDA under an Emergency Use Authorization (EUA). This EUA will remain  in effect (meaning this test can be used) for the duration of the COVID-19 declaration under Section 564(b)(1) of the Act, 21 U.S.C.section 360bbb-3(b)(1), unless the authorization is terminated  or revoked sooner.       Influenza A by PCR NEGATIVE NEGATIVE Final   Influenza B by PCR NEGATIVE NEGATIVE Final    Comment: (NOTE) The Xpert Xpress SARS-CoV-2/FLU/RSV plus assay is intended as an aid in the diagnosis of influenza from Nasopharyngeal swab specimens and should not be used as a sole basis for treatment. Nasal washings and aspirates are unacceptable for Xpert Xpress SARS-CoV-2/FLU/RSV testing.  Fact Sheet for Patients: EntrepreneurPulse.com.au  Fact Sheet for Healthcare  Providers: IncredibleEmployment.be  This test is not yet approved or cleared by the [place] FDA and has been authorized for detection and/or diagnosis of SARS-CoV-2 by FDA under an Emergency Use Authorization (EUA). This EUA will remain in effect (meaning this test can be used) for the duration of the COVID-19 declaration under Section 564(b)(1) of the Act, 21 U.S.C. section 360bbb-3(b)(1), unless the authorization is terminated or revoked.  Performed at [facility], [place] [address], [place], [place] [address]    Today   Subjective    Bradford Heid today has no new complaints  No fever  Or chills   No Nausea, Vomiting or Diarrhea         Patient has been seen and examined prior to discharge   Objective   Blood pressure 119/78, pulse (!) 50, temperature 98.3 F (36.8 C), temperature source Oral, resp. rate 19, SpO2 99 %.   Intake/Output Summary (Last 24 hours) at 01/04/2021 1422 Last data filed at 01/03/2021 1606 Gross per 24 hour  Intake 500 ml  Output --  Net 500 ml    Exam Gen:- Awake Alert, no acute distress,  HEENT:- [place].AT, No sclera icterus Neck-Supple Neck,No JVD,.  Lungs-  CTAB , good air movement bilaterally  CV- S1, S2 normal, regular Abd-  +ve B.Sounds, Abd Soft, No tenderness,    Extremity/Skin:- No  edema,   good pulses Psych-affect is appropriate, oriented x3--- patient with underlying/baseline cognitive and memory deficits Neuro-generalized weakness, no new focal deficits, no tremors    Data Review   CBC w Diff:  Lab Results  Component Value Date   WBC 5.0 01/03/2021   HGB 9.9 (L) 01/03/2021   HCT 31.9 (L) 01/03/2021   PLT 236 01/03/2021   LYMPHOPCT 22 01/03/2021   MONOPCT 9 01/03/2021   EOSPCT 1 01/03/2021   BASOPCT 0 01/03/2021    CMP:  Lab Results  Component Value Date   NA 138 01/03/2021   K 3.8 01/03/2021   CL 108 01/03/2021   CO2 22 01/03/2021   BUN 16 01/03/2021   CREATININE 0.68 01/03/2021   PROT  6.1 (L) 01/03/2021   ALBUMIN 3.0 (L) 01/03/2021   BILITOT 0.7 01/03/2021   ALKPHOS 38 01/03/2021   AST 15 01/03/2021   ALT 10 01/03/2021  .   Total Discharge time is about 33 minutes  Roxan Hockey M.D on 01/04/2021 at 2:22 PM  Go to [email or web address] -  for contact info  [facility] - Office  [phone]

## 2021-01-04 NOTE — Procedures (Addendum)
Patient Name: Natalie Bullock  MRN: [id number]  Epilepsy Attending: Lora Havens  Referring Physician/Provider: Anibal Henderson, NP Duration: 01/03/2021 1027 to 01/04/2021 1230  Patient history:  87 y.o. female who presented to the ED for evaluation of acute onset decreased responsiveness, right-sided neglect, and right gaze preference.  EEG to evaluate for seizures.  Level of alertness: Awake, asleep  AEDs during EEG study: LEV  Technical aspects: This EEG study was done with scalp electrodes positioned according to the 10-20 International system of electrode placement. Electrical activity was acquired at a sampling rate of '500Hz'$  and reviewed with a high frequency filter of '70Hz'$  and a low frequency filter of '1Hz'$ . EEG data were recorded continuously and digitally stored.   Description: The posterior dominant rhythm consists of 8 Hz activity of moderate voltage (25-35 uV) seen predominantly in posterior head regions, symmetric and reactive to eye opening and eye closing. Sleep was characterized by vertex waves, sleep spindles (12 to 14 Hz), maximal frontocentral region.  Intermittent generalized 3 to '5Hz'$  theta- delta slowing was also noted.  Hyperventilation and photic stimulation were not performed.     Parts of study were difficult to interpret due to significant electrode artifact as patient was not electrodes.  ABNORMALITY - Intermittent slow, generalized  IMPRESSION: This technically difficult study is suggestive of mild diffuse encephalopathy, nonspecific etiology. No seizures or epileptiform discharges were seen throughout the recording.  Deby Adger [name]

## 2021-01-05 MED ORDER — LEVETIRACETAM 750 MG PO TABS
1500.0000 mg | ORAL_TABLET | Freq: Two times a day (BID) | ORAL | Status: DC
  Administered 2021-01-05: 1500 mg via ORAL
  Filled 2021-01-05: qty 2

## 2021-01-05 MED ORDER — LEVETIRACETAM 500 MG PO TABS: 500.0000 mg | ORAL_TABLET | Freq: Two times a day (BID) | ORAL | Status: DC

## 2021-01-05 NOTE — Progress Notes (Signed)
PTAR collected patient for transport to [facility].  VSS upon departure.  Patient alert, oriented only to self, otherwise quite confused.

## 2021-01-06 ENCOUNTER — Emergency Department (HOSPITAL_COMMUNITY): Payer: Medicare Other

## 2021-01-06 ENCOUNTER — Inpatient Hospital Stay (HOSPITAL_COMMUNITY)
Admission: EM | Admit: 2021-01-06 | Discharge: 2021-01-08 | DRG: 951 | Disposition: A | Discharge status: Skilled Nursing Facility | Payer: Medicare Other | Attending: Internal Medicine | Admitting: Internal Medicine

## 2021-01-06 DIAGNOSIS — J9601 Acute respiratory failure with hypoxia: Secondary | ICD-10-CM | POA: Diagnosis present

## 2021-01-06 DIAGNOSIS — Z4682 Encounter for fitting and adjustment of non-vascular catheter: Secondary | ICD-10-CM | POA: Diagnosis not present

## 2021-01-06 DIAGNOSIS — I69328 Other speech and language deficits following cerebral infarction: Secondary | ICD-10-CM | POA: Diagnosis not present

## 2021-01-06 DIAGNOSIS — F0150 Vascular dementia without behavioral disturbance: Secondary | ICD-10-CM | POA: Diagnosis present

## 2021-01-06 DIAGNOSIS — R5381 Other malaise: Secondary | ICD-10-CM | POA: Diagnosis not present

## 2021-01-06 DIAGNOSIS — Z951 Presence of aortocoronary bypass graft: Secondary | ICD-10-CM

## 2021-01-06 DIAGNOSIS — S0990XA Unspecified injury of head, initial encounter: Secondary | ICD-10-CM | POA: Diagnosis not present

## 2021-01-06 DIAGNOSIS — I480 Paroxysmal atrial fibrillation: Secondary | ICD-10-CM | POA: Diagnosis present

## 2021-01-06 DIAGNOSIS — W19XXXA Unspecified fall, initial encounter: Secondary | ICD-10-CM | POA: Diagnosis not present

## 2021-01-06 DIAGNOSIS — R6889 Other general symptoms and signs: Secondary | ICD-10-CM | POA: Diagnosis not present

## 2021-01-06 DIAGNOSIS — Z820 Family history of epilepsy and other diseases of the nervous system: Secondary | ICD-10-CM | POA: Diagnosis not present

## 2021-01-06 DIAGNOSIS — R404 Transient alteration of awareness: Secondary | ICD-10-CM | POA: Diagnosis not present

## 2021-01-06 DIAGNOSIS — Z9011 Acquired absence of right breast and nipple: Secondary | ICD-10-CM

## 2021-01-06 DIAGNOSIS — Z881 Allergy status to other antibiotic agents status: Secondary | ICD-10-CM

## 2021-01-06 DIAGNOSIS — I252 Old myocardial infarction: Secondary | ICD-10-CM | POA: Diagnosis not present

## 2021-01-06 DIAGNOSIS — Z888 Allergy status to other drugs, medicaments and biological substances status: Secondary | ICD-10-CM

## 2021-01-06 DIAGNOSIS — I2510 Atherosclerotic heart disease of native coronary artery without angina pectoris: Secondary | ICD-10-CM | POA: Diagnosis present

## 2021-01-06 DIAGNOSIS — G319 Degenerative disease of nervous system, unspecified: Secondary | ICD-10-CM | POA: Diagnosis not present

## 2021-01-06 DIAGNOSIS — S0093XA Contusion of unspecified part of head, initial encounter: Secondary | ICD-10-CM | POA: Diagnosis present

## 2021-01-06 DIAGNOSIS — Z7401 Bed confinement status: Secondary | ICD-10-CM | POA: Diagnosis not present

## 2021-01-06 DIAGNOSIS — E785 Hyperlipidemia, unspecified: Secondary | ICD-10-CM | POA: Diagnosis present

## 2021-01-06 DIAGNOSIS — S069X9A Unspecified intracranial injury with loss of consciousness of unspecified duration, initial encounter: Secondary | ICD-10-CM | POA: Diagnosis not present

## 2021-01-06 DIAGNOSIS — I959 Hypotension, unspecified: Secondary | ICD-10-CM | POA: Diagnosis not present

## 2021-01-06 DIAGNOSIS — Z885 Allergy status to narcotic agent status: Secondary | ICD-10-CM | POA: Diagnosis not present

## 2021-01-06 DIAGNOSIS — Z853 Personal history of malignant neoplasm of breast: Secondary | ICD-10-CM

## 2021-01-06 DIAGNOSIS — Z515 Encounter for palliative care: Principal | ICD-10-CM

## 2021-01-06 DIAGNOSIS — F0390 Unspecified dementia without behavioral disturbance: Secondary | ICD-10-CM | POA: Diagnosis present

## 2021-01-06 DIAGNOSIS — Z66 Do not resuscitate: Secondary | ICD-10-CM

## 2021-01-06 DIAGNOSIS — R001 Bradycardia, unspecified: Secondary | ICD-10-CM | POA: Diagnosis not present

## 2021-01-06 DIAGNOSIS — Y92129 Unspecified place in nursing home as the place of occurrence of the external cause: Secondary | ICD-10-CM

## 2021-01-06 DIAGNOSIS — G9340 Encephalopathy, unspecified: Secondary | ICD-10-CM | POA: Diagnosis present

## 2021-01-06 DIAGNOSIS — Z882 Allergy status to sulfonamides status: Secondary | ICD-10-CM

## 2021-01-06 DIAGNOSIS — R0902 Hypoxemia: Secondary | ICD-10-CM

## 2021-01-06 DIAGNOSIS — Z7189 Other specified counseling: Secondary | ICD-10-CM | POA: Diagnosis not present

## 2021-01-06 DIAGNOSIS — E039 Hypothyroidism, unspecified: Secondary | ICD-10-CM | POA: Diagnosis present

## 2021-01-06 DIAGNOSIS — I10 Essential (primary) hypertension: Secondary | ICD-10-CM | POA: Diagnosis present

## 2021-01-06 DIAGNOSIS — D638 Anemia in other chronic diseases classified elsewhere: Secondary | ICD-10-CM | POA: Diagnosis present

## 2021-01-06 DIAGNOSIS — Z87891 Personal history of nicotine dependence: Secondary | ICD-10-CM

## 2021-01-06 DIAGNOSIS — W010XXA Fall on same level from slipping, tripping and stumbling without subsequent striking against object, initial encounter: Secondary | ICD-10-CM | POA: Diagnosis present

## 2021-01-06 DIAGNOSIS — I499 Cardiac arrhythmia, unspecified: Secondary | ICD-10-CM | POA: Diagnosis not present

## 2021-01-06 DIAGNOSIS — I700 Atherosclerosis of aorta: Secondary | ICD-10-CM | POA: Diagnosis not present

## 2021-01-06 DIAGNOSIS — R4020 Unspecified coma: Secondary | ICD-10-CM | POA: Diagnosis not present

## 2021-01-06 DIAGNOSIS — R531 Weakness: Secondary | ICD-10-CM | POA: Diagnosis not present

## 2021-01-06 DIAGNOSIS — J984 Other disorders of lung: Secondary | ICD-10-CM | POA: Diagnosis not present

## 2021-01-06 DIAGNOSIS — Z79899 Other long term (current) drug therapy: Secondary | ICD-10-CM

## 2021-01-06 DIAGNOSIS — Z20822 Contact with and (suspected) exposure to covid-19: Secondary | ICD-10-CM | POA: Diagnosis present

## 2021-01-06 DIAGNOSIS — Z79890 Hormone replacement therapy (postmenopausal): Secondary | ICD-10-CM

## 2021-01-06 DIAGNOSIS — Z743 Need for continuous supervision: Secondary | ICD-10-CM | POA: Diagnosis not present

## 2021-01-06 DIAGNOSIS — Z7982 Long term (current) use of aspirin: Secondary | ICD-10-CM

## 2021-01-06 LAB — CBC WITH DIFFERENTIAL/PLATELET
Abs Immature Granulocytes: 0.02 10*3/uL (ref 0.00–0.07)
Basophils Absolute: 0.1 10*3/uL (ref 0.0–0.1)
Basophils Relative: 1 %
Eosinophils Absolute: 0.1 10*3/uL (ref 0.0–0.5)
Eosinophils Relative: 2 %
HCT: 33 % — ABNORMAL LOW (ref 36.0–46.0)
Hemoglobin: 10.3 g/dL — ABNORMAL LOW (ref 12.0–15.0)
Immature Granulocytes: 0 %
Lymphocytes Relative: 46 %
Lymphs Abs: 2.9 10*3/uL (ref 0.7–4.0)
MCH: 28.8 pg (ref 26.0–34.0)
MCHC: 31.2 g/dL (ref 30.0–36.0)
MCV: 92.2 fL (ref 80.0–100.0)
Monocytes Absolute: 0.5 10*3/uL (ref 0.1–1.0)
Monocytes Relative: 8 %
Neutro Abs: 2.8 10*3/uL (ref 1.7–7.7)
Neutrophils Relative %: 43 %
Platelets: 253 10*3/uL (ref 150–400)
RBC: 3.58 MIL/uL — ABNORMAL LOW (ref 3.87–5.11)
RDW: 16.9 % — ABNORMAL HIGH (ref 11.5–15.5)
WBC: 6.4 10*3/uL (ref 4.0–10.5)
nRBC: 0.5 % — ABNORMAL HIGH (ref 0.0–0.2)

## 2021-01-06 LAB — COMPREHENSIVE METABOLIC PANEL
ALT: 8 U/L (ref 0–44)
AST: 16 U/L (ref 15–41)
Albumin: 3.1 g/dL — ABNORMAL LOW (ref 3.5–5.0)
Alkaline Phosphatase: 40 U/L (ref 38–126)
Anion gap: 9 (ref 5–15)
BUN: 15 mg/dL (ref 8–23)
CO2: 20 mmol/L — ABNORMAL LOW (ref 22–32)
Calcium: 9.3 mg/dL (ref 8.9–10.3)
Chloride: 112 mmol/L — ABNORMAL HIGH (ref 98–111)
Creatinine, Ser: 1.02 mg/dL — ABNORMAL HIGH (ref 0.44–1.00)
GFR, Estimated: 51 mL/min — ABNORMAL LOW (ref >60)
Glucose, Bld: 133 mg/dL — ABNORMAL HIGH (ref 70–99)
Potassium: 3.6 mmol/L (ref 3.5–5.1)
Sodium: 141 mmol/L (ref 135–145)
Total Bilirubin: 0.4 mg/dL (ref 0.3–1.2)
Total Protein: 6.3 g/dL — ABNORMAL LOW (ref 6.5–8.1)

## 2021-01-06 LAB — I-STAT VENOUS BLOOD GAS, ED
Acid-base deficit: 3 mmol/L — ABNORMAL HIGH (ref 0.0–2.0)
Bicarbonate: 21.3 mmol/L (ref 20.0–28.0)
Calcium, Ion: 1.19 mmol/L (ref 1.15–1.40)
HCT: 31 % — ABNORMAL LOW (ref 36.0–46.0)
Hemoglobin: 10.5 g/dL — ABNORMAL LOW (ref 12.0–15.0)
O2 Saturation: 69 %
Potassium: 3.5 mmol/L (ref 3.5–5.1)
Sodium: 142 mmol/L (ref 135–145)
TCO2: 22 mmol/L (ref 22–32)
pCO2, Ven: 35.9 mmHg — ABNORMAL LOW (ref 44.0–60.0)
pH, Ven: 7.381 (ref 7.250–7.430)
pO2, Ven: 36 mmHg (ref 32.0–45.0)

## 2021-01-06 LAB — I-STAT CHEM 8, ED
BUN: 15 mg/dL (ref 8–23)
Calcium, Ion: 1.19 mmol/L (ref 1.15–1.40)
Chloride: 110 mmol/L (ref 98–111)
Creatinine, Ser: 0.8 mg/dL (ref 0.44–1.00)
Glucose, Bld: 132 mg/dL — ABNORMAL HIGH (ref 70–99)
HCT: 31 % — ABNORMAL LOW (ref 36.0–46.0)
Hemoglobin: 10.5 g/dL — ABNORMAL LOW (ref 12.0–15.0)
Potassium: 3.5 mmol/L (ref 3.5–5.1)
Sodium: 142 mmol/L (ref 135–145)
TCO2: 22 mmol/L (ref 22–32)

## 2021-01-06 LAB — MAGNESIUM: Magnesium: 2.3 mg/dL (ref 1.7–2.4)

## 2021-01-06 LAB — CBG MONITORING, ED: Glucose-Capillary: 112 mg/dL — ABNORMAL HIGH (ref 70–99)

## 2021-01-06 LAB — SARS CORONAVIRUS 2 (TAT 6-24 HRS): SARS Coronavirus 2: NEGATIVE

## 2021-01-06 LAB — TROPONIN I (HIGH SENSITIVITY): Troponin I (High Sensitivity): 17 ng/L (ref <18)

## 2021-01-06 LAB — BRAIN NATRIURETIC PEPTIDE: B Natriuretic Peptide: 123.1 pg/mL — ABNORMAL HIGH (ref 0.0–100.0)

## 2021-01-06 MED ORDER — ONDANSETRON HCL 4 MG/2ML IJ SOLN: 4.0000 mg | Freq: Four times a day (QID) | INTRAMUSCULAR | Status: DC | PRN

## 2021-01-06 MED ORDER — FLEET ENEMA 7-19 GM/118ML RE ENEM
1.0000 | ENEMA | Freq: Every day | RECTAL | Status: DC | PRN
  Filled 2021-01-06: qty 1

## 2021-01-06 MED ORDER — ETOMIDATE 2 MG/ML IV SOLN
INTRAVENOUS | Status: AC
  Filled 2021-01-06: qty 20

## 2021-01-06 MED ORDER — GLYCOPYRROLATE 0.2 MG/ML IJ SOLN: 0.2000 mg | INTRAMUSCULAR | Status: DC | PRN

## 2021-01-06 MED ORDER — PROPOFOL 1000 MG/100ML IV EMUL
INTRAVENOUS | Status: AC
  Administered 2021-01-06: 5 ug/kg/min via INTRAVENOUS
  Filled 2021-01-06: qty 100

## 2021-01-06 MED ORDER — CAMPHOR-MENTHOL 0.5-0.5 % EX LOTN: 1.0000 "application " | TOPICAL_LOTION | Freq: Four times a day (QID) | CUTANEOUS | Status: DC | PRN

## 2021-01-06 MED ORDER — FENTANYL CITRATE (PF) 100 MCG/2ML IJ SOLN
INTRAMUSCULAR | Status: AC
  Administered 2021-01-06: 50 ug via INTRAVENOUS
  Filled 2021-01-06: qty 2

## 2021-01-06 MED ORDER — BISACODYL 10 MG RE SUPP: 10.0000 mg | Freq: Every day | RECTAL | Status: DC | PRN

## 2021-01-06 MED ORDER — NOREPINEPHRINE 4 MG/250ML-% IV SOLN
INTRAVENOUS | Status: AC
  Filled 2021-01-06: qty 250

## 2021-01-06 MED ORDER — ONDANSETRON 4 MG PO TBDP: 4.0000 mg | ORAL_TABLET | Freq: Four times a day (QID) | ORAL | Status: DC | PRN

## 2021-01-06 MED ORDER — MORPHINE SULFATE (PF) 2 MG/ML IV SOLN: 1.0000 mg | INTRAVENOUS | Status: DC | PRN

## 2021-01-06 MED ORDER — HALOPERIDOL LACTATE 5 MG/ML IJ SOLN
0.5000 mg | INTRAMUSCULAR | Status: DC | PRN
  Administered 2021-01-06: 0.5 mg via INTRAVENOUS
  Filled 2021-01-06: qty 1

## 2021-01-06 MED ORDER — FENTANYL CITRATE (PF) 100 MCG/2ML IJ SOLN: 50.0000 ug | INTRAMUSCULAR | Status: DC | PRN

## 2021-01-06 MED ORDER — ALBUTEROL SULFATE (2.5 MG/3ML) 0.083% IN NEBU: 2.5000 mg | INHALATION_SOLUTION | RESPIRATORY_TRACT | Status: DC | PRN

## 2021-01-06 MED ORDER — FENTANYL CITRATE (PF) 100 MCG/2ML IJ SOLN: 25.0000 ug | INTRAMUSCULAR | Status: DC | PRN

## 2021-01-06 MED ORDER — SODIUM CHLORIDE 0.9% FLUSH: 3.0000 mL | INTRAVENOUS | Status: DC | PRN

## 2021-01-06 MED ORDER — HALOPERIDOL LACTATE 2 MG/ML PO CONC
0.5000 mg | ORAL | Status: DC | PRN
  Filled 2021-01-06: qty 0.3

## 2021-01-06 MED ORDER — PROPOFOL 1000 MG/100ML IV EMUL: 0.0000 ug/kg/min | INTRAVENOUS | Status: DC

## 2021-01-06 MED ORDER — SUCCINYLCHOLINE CHLORIDE 200 MG/10ML IV SOSY
PREFILLED_SYRINGE | INTRAVENOUS | Status: AC
  Filled 2021-01-06: qty 10

## 2021-01-06 MED ORDER — ENSURE ENLIVE PO LIQD: 237.0000 mL | Freq: Two times a day (BID) | ORAL | Status: DC

## 2021-01-06 MED ORDER — SODIUM CHLORIDE 0.9% FLUSH
3.0000 mL | Freq: Two times a day (BID) | INTRAVENOUS | Status: DC
  Administered 2021-01-07 – 2021-01-08 (×4): 3 mL via INTRAVENOUS

## 2021-01-06 MED ORDER — SODIUM CHLORIDE 0.9 % IV SOLN: 250.0000 mL | INTRAVENOUS | Status: DC | PRN

## 2021-01-06 MED ORDER — ROCURONIUM BROMIDE 10 MG/ML (PF) SYRINGE
PREFILLED_SYRINGE | INTRAVENOUS | Status: AC
  Filled 2021-01-06: qty 10

## 2021-01-06 MED ORDER — SODIUM CHLORIDE 0.9 % IV SOLN: INTRAVENOUS | Status: DC

## 2021-01-06 MED ORDER — SODIUM CHLORIDE 0.9 % IV BOLUS
1000.0000 mL | Freq: Once | INTRAVENOUS | Status: AC
  Administered 2021-01-06: 1000 mL via INTRAVENOUS

## 2021-01-06 MED ORDER — HALOPERIDOL 0.5 MG PO TABS
0.5000 mg | ORAL_TABLET | ORAL | Status: DC | PRN
  Filled 2021-01-06: qty 1

## 2021-01-06 MED ORDER — GLYCOPYRROLATE 1 MG PO TABS
1.0000 mg | ORAL_TABLET | ORAL | Status: DC | PRN
  Filled 2021-01-06: qty 1

## 2021-01-06 MED ORDER — ASPIRIN EC 81 MG PO TBEC: 81.0000 mg | DELAYED_RELEASE_TABLET | Freq: Every day | ORAL | Status: DC

## 2021-01-06 MED ORDER — ACETAMINOPHEN 325 MG PO TABS: 650.0000 mg | ORAL_TABLET | Freq: Four times a day (QID) | ORAL | Status: DC | PRN

## 2021-01-06 MED ORDER — FENTANYL CITRATE (PF) 100 MCG/2ML IJ SOLN
25.0000 ug | INTRAMUSCULAR | Status: DC | PRN
Start: 2021-01-06 — End: 2021-01-06
  Administered 2021-01-06 (×2): 25 ug via INTRAVENOUS
  Filled 2021-01-06 (×2): qty 2

## 2021-01-06 MED ORDER — POLYVINYL ALCOHOL 1.4 % OP SOLN: 1.0000 [drp] | Freq: Four times a day (QID) | OPHTHALMIC | Status: DC | PRN

## 2021-01-06 MED ORDER — ACETAMINOPHEN 650 MG RE SUPP: 650.0000 mg | Freq: Four times a day (QID) | RECTAL | Status: DC | PRN

## 2021-01-06 MED ORDER — ACETAMINOPHEN 325 MG PO TABS
650.0000 mg | ORAL_TABLET | Freq: Three times a day (TID) | ORAL | Status: DC
  Administered 2021-01-06: 650 mg via ORAL
  Filled 2021-01-06: qty 2

## 2021-01-06 MED ORDER — MORPHINE SULFATE (CONCENTRATE) 10 MG/0.5ML PO SOLN
5.0000 mg | Freq: Four times a day (QID) | ORAL | Status: DC
Start: 2021-01-06 — End: 2021-01-09
  Administered 2021-01-06 – 2021-01-08 (×7): 5 mg via SUBLINGUAL
  Filled 2021-01-06 (×8): qty 0.5

## 2021-01-06 MED ORDER — KETOROLAC TROMETHAMINE 15 MG/ML IJ SOLN
15.0000 mg | Freq: Three times a day (TID) | INTRAMUSCULAR | Status: DC
  Administered 2021-01-06: 15 mg via INTRAVENOUS
  Filled 2021-01-06: qty 1

## 2021-01-06 MED ORDER — LEVOTHYROXINE SODIUM 50 MCG PO TABS
50.0000 ug | ORAL_TABLET | Freq: Every day | ORAL | Status: DC
  Administered 2021-01-07 – 2021-01-08 (×2): 50 ug via ORAL
  Filled 2021-01-06 (×2): qty 1

## 2021-01-06 MED ORDER — POLYETHYL GLYCOL-PROPYL GLYCOL 0.4-0.3 % OP GEL
Freq: Every morning | OPHTHALMIC | Status: DC
  Filled 2021-01-06: qty 10

## 2021-01-06 NOTE — ED Notes (Signed)
Pt awake, alert, oriented to self and can recall she is in the hospital.  Pt ate most of dinner and fluids on tray. More comfortable following medication.  Pleasant and cooperative with care.

## 2021-01-06 NOTE — ED Notes (Signed)
Pt restless, reporting back pain, repositioned, medicated as ordered.

## 2021-01-06 NOTE — ED Provider Notes (Addendum)
[facility] EMERGENCY DEPARTMENT Provider Note  CSN: [id number] Arrival date & time: 01/06/21 0459  Chief Complaint(s) Fall Andre Lefort after fall)  HPI Natalie Bullock is a 87 y.o. female presents after an unwitnessed fall at the nursing facility.  Patient was noted to be bradycardic, hypotensive and hypoxic.  No DNR or MOST form provided.  In route patient required pacing, epi drip, and intubation by EMS.  Remainder of history, ROS, and physical exam limited due to patient's condition (unresponsive and intubated). Additional information was obtained from EMS, nursing facility, nephew.   Level V Caveat.  Patient was transitioned to our [place].  No pacing required.  Initially SVT versus flutter versus AF versus sinus tachycardia noted on telemetry.  Reverted back to normal sinus rhythm and the rates of 70s while attempting to get an EKG.   On chart review, noted that patient was a DNR/DNI the last 5 times. I will checked paperwork provided by the skilled nursing facility and no DNR/MOST form provided. I called facility and spoke with the patient's nurse.  She reported that around 4 AM the patient was found minimally responsive and on the floor by a tech. While attempting to get the patient up on the bed, she slipped and fell hitting her head. They were able to get the patient back on the bed. They noted the patient's heart rates in the 0000000, systolic blood pressures in the 80s, and oxygen saturations in the 80s.  Patient was placed on oxygen.  Nephew was called who agreed with sending the patient out for evaluation to the hospital.  EMS called. She reported that they were aware that the patient was a DNR/DNI and she had a MOST form previously documenting the patient did not want hospitalization, however they were unable to find the form in her chart.  I then called the patient's nephew and confirmed the patient's CODE STATUS and goals of comfort care.  He was updated on  patient's status.  HPI  Past Medical History Past Medical History:  Diagnosis Date  . Anemia   . Arthritis    osteoarthritis  . Breast cancer ([place]) 06/02/1973   Right sided mastectomy  . Coronary artery disease   . H/O hypokalemia   . Hx of CABG    pt states she had 5 bypasses  . Hyperlipidemia   . Hypertension   . Hypothyroidism   . IHD (ischemic heart disease)    with CABG in August 2002  . Myocardial infarction ([facility])    Non Q-wave MI  . 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 Stroke ([place])   . Vitamin D deficiency    Patient Active Problem List   Diagnosis Date Noted  . Protein-calorie malnutrition, severe 01/04/2021  . Neurological abnormality 01/03/2021  . Dementia ([place]) 01/03/2021  . Neurologic abnormality 01/03/2021  . Acute encephalopathy 01/02/2021  . Memory loss   . Goals of care, counseling/discussion   . Palliative care by specialist   . GI bleed 11/27/2020  . Encephalopathy 11/03/2020  . Fall   . Orthostasis   . Mitral regurgitation 03/07/2014  . Near syncope 03/26/2013  . Delirium 09/12/2011  . HTN (hypertension) 08/17/2011  . HX: breast cancer 08/14/2011  . Hypothyroidism 08/14/2011  . Hx of CABG 11/19/2010  . Hyperlipidemia 11/19/2010   Home Medication(s) Prior to Admission medications   Medication Sig Start Date End Date Taking? Authorizing Provider  acetaminophen (TYLENOL) 325 MG tablet Take 650  mg by mouth 3 (three) times daily.   Yes [provider]  aspirin EC 81 MG tablet Take 1 tablet (81 mg total) by mouth daily with breakfast. 01/04/21 01/04/22 Yes Emokpae, Courage, MD  bisacodyl (DULCOLAX) 10 MG suppository Place 10 mg rectally See admin instructions. Qd prn  constipation if no relief from milk of mag   Yes [provider]  feeding supplement (ENSURE ENLIVE / ENSURE PLUS) LIQD Take 237 mLs by mouth 2 (two) times daily between meals. 01/04/21  Yes Emokpae, Courage, MD  furosemide  (LASIX) 20 MG tablet Take 20 mg by mouth daily.   Yes [provider]  levothyroxine (SYNTHROID, LEVOTHROID) 50 MCG tablet Take 50 mcg by mouth daily before breakfast.   Yes [provider]  magnesium hydroxide (MILK OF MAGNESIA) 400 MG/5ML suspension Take 30 mLs by mouth daily as needed for mild constipation.   Yes [provider]  Menthol, Topical Analgesic, (BIOFREEZE) 4 % GEL Apply 1 application topically every 6 (six) hours as needed (left and right shoulder pain).   Yes [provider]  metoprolol tartrate (LOPRESSOR) 25 MG tablet Take 0.5 tablets (12.5 mg total) by mouth 2 (two) times daily. 01/04/21  Yes Roxan Hockey, MD  Multiple Vitamin (MULTIVITAMIN WITH MINERALS) TABS tablet Take 1 tablet by mouth daily. 01/04/21  Yes Roxan Hockey, MD  Polyethyl Glycol-Propyl Glycol (SYSTANE OP) Place 2 drops into both eyes every morning.   Yes [provider]  pravastatin (PRAVACHOL) 20 MG tablet Take 1 tablet (20 mg total) by mouth at bedtime. 11/30/20  Yes Hosie Poisson, MD  Sodium Phosphates (RA SALINE ENEMA RE) Place 1 enema rectally See admin instructions. Qd prn constipation if no relief from milk of mag or bisacodyl suppository   Yes [provider]                                                                                                                                    Past Surgical History Past Surgical History:  Procedure Laterality Date  . BREAST SURGERY     left breast cyst surgery   . CARDIAC CATHETERIZATION  01/20/2001   NORMAL. EF 60-70%  . CARDIOVASCULAR STRESS TEST  Aug 2010   Normal; EF 74%  . CORONARY ARTERY BYPASS GRAFT  12/2000   LEFT INTERNAL MAMMARY TO THE LAD, SAPHENOUS VEIN GRAFT TO THE FIRST DIAGONAL, SAPHENOUS VEIN GRAFT TO THE RAMUS INTERMEDIATE, AND A SEQUENTIAL SAPHENOUS VEIN GRAFT TO THE POSTERIOR DESCENDING AND POSTERIOR LATERAL BRANCHES.  Marland Kitchen MASS EXCISION  04/08/2011   Procedure: EXCISION MASS;  Surgeon:  Wynonia Sours, MD;  Location: [facility];  Service: Orthopedics;  Laterality: Left;  excision mass left palm  . MASTECTOMY  1975   RIGHT SIDE  . TUBAL LIGATION     1960's   Family History Family History  Problem Relation Age of Onset  . Alzheimer's disease Mother  Social History Social History   Tobacco Use  . Smoking status: Former    Packs/day: 1.00    Years: 4.00    Pack years: 4.00    Types: Cigarettes    Quit date: 06/02/1969    Years since quitting: 51.6  . Smokeless tobacco: Never  Substance Use Topics  . Alcohol use: No  . Drug use: No   Allergies Valium, Codeine, Crestor [rosuvastatin calcium], Doxycycline, Ezetimibe-simvastatin, Lipitor [atorvastatin calcium], Sulfa drugs cross reactors, Tequin, and Zocor [simvastatin]  Review of Systems Review of Systems  Unable to perform ROS: Patient unresponsive   Physical Exam Vital Signs  I have reviewed the triage vital signs BP 126/73   Pulse 91   Temp 98.3 F (36.8 C)   Resp 16   Ht '5\' 2"'$  (1.575 m)   Wt 54.4 kg   SpO2 98%   BMI 21.95 kg/m   Physical Exam Vitals reviewed.  Constitutional:      General: She is in acute distress.     Appearance: She is well-developed. She is ill-appearing and toxic-appearing. She is not diaphoretic.     Interventions: She is intubated.  HENT:     Head: Normocephalic. Laceration (occipital, hemostatic) present.     Nose: Nose normal.  Eyes:     General: No scleral icterus.       Right eye: No discharge.        Left eye: No discharge.     Conjunctiva/sclera: Conjunctivae normal.     Pupils: Pupils are equal, round, and reactive to light.  Cardiovascular:     Rate and Rhythm: Normal rate and regular rhythm.     Heart sounds: No murmur heard.   No friction rub. No gallop.     Comments: Paced at 60BPM, 223m Pulmonary:     Effort: Pulmonary effort is normal. No respiratory distress. She is intubated.     Breath sounds: Normal breath sounds. No stridor.  No rales.  Abdominal:     General: There is no distension.     Palpations: Abdomen is soft.     Tenderness: There is no abdominal tenderness.  Musculoskeletal:        General: No tenderness.     Cervical back: Normal range of motion and neck supple.  Skin:    General: Skin is warm and dry.     Findings: No erythema or rash.  Neurological:     Mental Status: She is unresponsive.    ED Results and Treatments Labs (all labs ordered are listed, but only abnormal results are displayed) Labs Reviewed  COMPREHENSIVE METABOLIC PANEL - Abnormal; Notable for the following components:      Result Value   Chloride 112 (*)    CO2 20 (*)    Glucose, Bld 133 (*)    Creatinine, Ser 1.02 (*)    Total Protein 6.3 (*)    Albumin 3.1 (*)    GFR, Estimated 51 (*)    All other components within normal limits  CBC WITH DIFFERENTIAL/PLATELET - Abnormal; Notable for the following components:   RBC 3.58 (*)    Hemoglobin 10.3 (*)    HCT 33.0 (*)    RDW 16.9 (*)    nRBC 0.5 (*)    All other components within normal limits  CBG MONITORING, ED - Abnormal; Notable for the following components:   Glucose-Capillary 112 (*)    All other components within normal limits  I-STAT CHEM 8, ED - Abnormal; Notable for the following components:  Glucose, Bld 132 (*)    Hemoglobin 10.5 (*)    HCT 31.0 (*)    All other components within normal limits  I-STAT VENOUS BLOOD GAS, ED - Abnormal; Notable for the following components:   pCO2, Ven 35.9 (*)    Acid-base deficit 3.0 (*)    HCT 31.0 (*)    Hemoglobin 10.5 (*)    All other components within normal limits  SARS CORONAVIRUS 2 (TAT 6-24 HRS)  MAGNESIUM  BRAIN NATRIURETIC PEPTIDE  TROPONIN I (HIGH SENSITIVITY)                                                                                                                         EKG  EKG Interpretation  Date/Time:  Sunday January 06 2021 05:02:07 EDT Ventricular Rate:  70 PR Interval:  205 QRS  Duration: 103 QT Interval:  409 QTC Calculation: 442 R Axis:   46 Text Interpretation: Sinus rhythm Anteroseptal infarct, age indeterminate Baseline wander in lead(s) V6 Confirmed by Addison Lank [phone]) on 01/06/2021 6:58:02 AM       Radiology DG Chest Portable 1 View  Result Date: 01/06/2021 CLINICAL DATA:  87 year old female status post intubation. EXAM: PORTABLE CHEST 1 VIEW COMPARISON:  Chest x-ray 01/02/2021. FINDINGS: An endotracheal tube is in place with tip 8.8 cm above the carina. Transcutaneous defibrillator pads project over the lower left hemithorax. Lung volumes are low. Mild linear scarring in the left mid to lower lung, similar to the prior study. No acute consolidative airspace disease. No pleural effusions. No pneumothorax. No evidence of pulmonary edema. Heart size is normal. Aortic atherosclerosis. Status post median sternotomy for CABG. IMPRESSION: 1. Support apparatus, as above. 2. Low lung volumes with mild scarring in the left mid to lower lung. 3. Aortic atherosclerosis. Electronically Signed   By: Vinnie Langton M.D.   On: 01/06/2021 05:44    Pertinent labs & imaging results that were available during my care of the patient were reviewed by me and considered in my medical decision making (see MDM for details).  Medications Ordered in ED Medications  sodium chloride 0.9 % bolus 1,000 mL (0 mLs Intravenous Stopped 01/06/21 0600)    And  0.9 %  sodium chloride infusion ( Intravenous New Bag/Given 01/06/21 0612)  sodium chloride flush (NS) 0.9 % injection 3 mL (has no administration in time range)  sodium chloride flush (NS) 0.9 % injection 3 mL (has no administration in time range)  0.9 %  sodium chloride infusion (has no administration in time range)  fentaNYL (SUBLIMAZE) injection 25 mcg (25 mcg Intravenous Given 01/06/21 0556)  ketorolac (TORADOL) 15 MG/ML injection 15 mg (15 mg Intravenous Given 01/06/21 0602)  haloperidol (HALDOL) tablet 0.5 mg ( Oral See Alternative  01/06/21 0556)    Or  haloperidol (HALDOL) 2 MG/ML solution 0.5 mg ( Sublingual See Alternative 01/06/21 0556)    Or  haloperidol lactate (HALDOL) injection 0.5 mg (0.5 mg Intravenous Given 01/06/21 0556)  Procedures .1-3 Lead EKG Interpretation  Date/Time: 01/06/2021 5:55 AM Performed by: Fatima Blank, MD Authorized by: Fatima Blank, MD     Interpretation: abnormal     ECG rate:  143   ECG rate assessment: tachycardic     Rhythm comment:  SVT vs flutter vs AF vs Sinus Tach .1-3 Lead EKG Interpretation  Date/Time: 01/06/2021 5:57 AM Performed by: Fatima Blank, MD Authorized by: Fatima Blank, MD     Interpretation: normal     ECG rate:  73   ECG rate assessment: normal     Rhythm: sinus rhythm     Ectopy: none     Conduction: normal   .Critical Care  Date/Time: 01/06/2021 5:57 AM Performed by: Fatima Blank, MD Authorized by: Fatima Blank, MD   Critical care provider statement:    Critical care time (minutes):  45   Critical care was necessary to treat or prevent imminent or life-threatening deterioration of the following conditions:  Cardiac failure and circulatory failure   Critical care was time spent personally by me on the following activities:  Discussions with consultants, evaluation of patient's response to treatment, examination of patient, ordering and performing treatments and interventions, ordering and review of laboratory studies, ordering and review of radiographic studies, pulse oximetry, re-evaluation of patient's condition, obtaining history from patient or surrogate and review of old charts (including critical care time)  Medical Decision Making / ED Course I have reviewed the nursing notes for this encounter and the patient's prior records (if available in EHR or on provided  paperwork).  Natalie Bullock was evaluated in Emergency Department on 01/06/2021 for the symptoms described in the history of present illness. She was evaluated in the context of the global COVID-19 pandemic, which necessitated consideration that the patient might be at risk for infection with the SARS-CoV-2 virus that causes COVID-19. Institutional protocols and algorithms that pertain to the evaluation of patients at risk for COVID-19 are in a state of rapid change based on information released by regulatory bodies including the CDC and federal and state organizations. These policies and algorithms were followed during the patient's care in the ED.  Clinical Course as of 01/06/21 [id number]  Nancy Fetter Jan 06, 2021  0520 I spoke with the patient's nurse at the skilled nursing facility who relayed the information noted above. [PC]  [id number] I spoke with Mr. Bosie Clos who is the patient's power of attorney and confirmed patient's wishes for DNR/DNI and comfort care.   We will move forward with extubation and comfort measures. [PC]  [id number] Patient extubated and placed on comfort care [PC]  0700 Patient is HDS. Responds to pain. Still pending CT head per request of nephew given incident at sNF. No intervention to be done if ICH identified. Admitted to medicine [PC]    Clinical Course User Index [PC] Christina Gintz, Grayce Sessions, MD     Pertinent labs & imaging results that were available during my care of the patient were reviewed by me and considered in my medical decision making:    Final Clinical Impression(s) / ED Diagnoses Final diagnoses:  Fall at nursing home, initial encounter  Traumatic injury of head, initial encounter  Bradycardia  Hypotension, unspecified hypotension type  Hypoxia     This chart was dictated using voice recognition software.  Despite best efforts to proofread,  errors can occur which can change the documentation meaning.      Fatima Blank, MD 01/06/21 [phone]

## 2021-01-06 NOTE — ED Notes (Signed)
Pt extubated by RT. Patient on comfort care measures at this time.

## 2021-01-06 NOTE — ED Notes (Signed)
Family updated as to patient's status. Nephew of pt on phone with Leonette Monarch, MD. Alanson Puls is point of contact of patient, and wants to respect wishes of patient. Family stated its appropriate to make patient comfort care at this time. Primary RN witnessed to conversation that ER MD, [place], had with patient contact. Patient contact stated that patient wishes are better suited for comfort care.

## 2021-01-06 NOTE — Procedures (Signed)
Extubation Procedure Note  Patient Details:   Name: Natalie Bullock DOB: 1925-05-16 MRN: [id number]   Airway Documentation:  Airway 6 mm (Active)  Secured at (cm) 25 cm 01/06/21 0517  Measured From Lips 01/06/21 0517  Secured Location Left 01/06/21 0517  Secured By [employer] 01/06/21 0517  Cuff Pressure (cm H2O) Green OR 18-26 [facility] 01/06/21 0517  Site Condition Dry;Cool 01/06/21 0517   Vent end date: (not recorded) Vent end time: (not recorded)   Evaluation  O2 sats: stable throughout Complications: No apparent complications Patient did tolerate procedure well. Bilateral Breath Sounds: Diminished, Rhonchi   Yes  [name] 01/06/2021, 6:09 AM

## 2021-01-06 NOTE — Consult Note (Signed)
Consultation Note Date: 01/06/2021   Patient Name: Natalie Bullock  DOB: 26-Jan-1925  MRN: [id number]  Age / Sex: 87 y.o., female  PCP: Seward Carol, MD Referring Physician: Norval Morton, MD  Reason for Consultation: goals of care, comfort care  HPI/Patient Profile: 87 y.o. female  with past medical history of vascular dementia, CAD s/p CABG, previous MI, paroxysmal a-fib not on anticoagulation due to high fall risk, breast cancer s/p right mastectomy, hypothyroidism, HLD, and HTN. She presented to the emergency department on 01/06/2021 after being found on the floor and unresponsive at her facility. Staff called patient's nephew who agreed to send patient in for evaluation. In route, patient noted to be bradycardic, hypotensive, and hypoxic. She was intubated in route by EMS as they had not been made aware of her code status. She also required epinephrine drip and pacing.  On arrival to the ED, zoll tracings noted SVT versus a-fib/flutter. Chest x-ray without acute abnormality. CT head showed no acute abnormality. EEG noted mild diffuse encephalopathy without signs of seizure activity. After contact with patient's nephew it was confirmed that code status is DNR/DNI with MOST form stating she did not want to be hospitalized. Patient was extubated with goals of comfort only.  Clinical Assessment and Goals of Care: Reviewed medical records.  Note that patient has had 4 previous inpatient admissions in the last 6 months. Most recently admitted 01/02/21-01/05/21 with acute encephalopathy, seizure versus CVA.  7/6-7/8 she was admitted for acute encephalopathy and concern for TIA 6/28-7/1 she was admitted for GI bleed  6/4-6/9 she was admitted with fall, acute metabolic encephalopathy, delirium, and a-fib with RVR (discharged to SNF)  Prior to sustaining a fall 11/03/20, she was living independently, ambulatory with a walker,  and managing her own medications.   I went to see patient at bedside in the ED. She is on room air. She remains on IVF at 75 ml/hr. She keeps her eyes closed but is able to verbalizes that she is having "a little" pain. Oriented to self only. Speech is slightly slurred and mumbled.   I spoke with her nephew [name] by phone to discuss diagnosis, prognosis, GOC, EOL wishes, disposition, and options. Patient is known to PMT from her previous admissions. I re-introduced Palliative Medicine as specialized medical care for people living with serious illness. It focuses on providing relief from the symptoms and stress of a serious illness.   We discussed her current illness and what it means in the larger context of her ongoing co-morbidities. [name] is accepting of patient's ongoing decline. He is understanding and accepting she is approaching EOL. Natural trajectory at EOL was discussed.  I confirmed with [name] that goal of care was comfort measures only. Reviewed that comfort care means allowing a natural course to occur with the goal of comfort and dignity rather than prolonging life. Discussed that comfort care would focus on keeping her clean and dry, no labs, no artificial hydration or feeding, no antibiotics, minimizing of medications, comfort feeds, and medication for  pain and dyspnea.   Offered and explained option to transition to residential hospice house with expected prognosis less than 2 weeks. [name] agrees and states his preference for [facility].   Questions and concerns were addressed. [name] expresses appreciation for PMT support.   Primary decision maker: [name] (nephew and HCPOA)    SUMMARY OF RECOMMENDATIONS   Full comfort measures  DNR/DNI as previously documented Transfer to [facility] when bed becomes available - TOC and hospice liaison notifed Discontinued orders that were not focused on comfort (IVF, cardiac monitoring) PRN medications are available for symptom management  at EOL PMT will continue to follow   Symptom Management:  Morphine CONCENTRATE 10 mg/0.65m oral solution 5 mg every 6 hours Morphine 1 mg IV every 3 hours prn for uncontrolled pain or dyspnea Lorazepam (ATIVAN) prn for anxiety Haloperidol (HALDOL) prn for agitation  Glycopyrrolate (ROBINUL) for excessive secretions Ondansetron (ZOFRAN) prn for nausea Polyvinyl alcohol (LIQUIFILM TEARS) prn for dry eyes Antiseptic oral rinse (BIOTENE) prn for dry mouth   Code Status/Advance Care Planning: DNR/DNI (present on admission, on file in [facility])  Palliative Prophylaxis:  Oral Care and Turn Reposition  Additional Recommendations (Limitations, Scope, Preferences): Full Comfort Care  Prognosis:  < 2 weeks  Discharge Planning: To Be Determined      Primary Diagnoses: Present on Admission: **None**   I have reviewed the medical record, interviewed the patient and family, and examined the patient. The following aspects are pertinent.  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)    [place]    Vitamin D deficiency      Family History  Problem Relation Age of Onset   Alzheimer's disease Mother    Scheduled Meds:  acetaminophen  650 mg Oral TID   [START ON 01/07/2021] levothyroxine  50 mcg Oral QAC breakfast   morphine CONCENTRATE  5 mg Sublingual Q6H   [START ON 01/07/2021] Polyethyl Glycol-Propyl Glycol   Both Eyes q morning   sodium chloride flush  3 mL Intravenous Q12H   Continuous Infusions:  sodium chloride     PRN Meds:.sodium chloride,  acetaminophen **OR** acetaminophen, albuterol, bisacodyl, camphor-menthol, fentaNYL (SUBLIMAZE) injection, haloperidol **OR** haloperidol **OR** haloperidol lactate, morphine injection, ondansetron **OR** ondansetron (ZOFRAN) IV, polyvinyl alcohol, sodium chloride flush, sodium phosphate   Allergies  Allergen Reactions   Valium Shortness Of Breath and Other (See Comments)    "ALLERGIC," per MAR   Codeine Other (See Comments)    [facility]  Crestor [Rosuvastatin Calcium] Other (See Comments)    "ALLERGIC," per MAR   Doxycycline Other (See Comments)    "ALLERGIC," per MAR   Ezetimibe-Simvastatin Other (See Comments)    Vytorin- "ALLERGIC," per MAR   Lipitor [Atorvastatin Calcium] Other (See Comments)    "ALLERGIC," per MAR   Sulfa Drugs Cross Reactors Other (See Comments)    "ALLERGIC," per MAR   Tequin Other (See Comments)    "ALLERGIC," per  MAR   Zocor [Simvastatin] Other (See Comments)    "ALLERGIC," per MAR   Review of Systems  Unable to perform ROS: Dementia   Physical Exam Constitutional:      General: She is not in acute distress.    Appearance: She is ill-appearing.  Pulmonary:     Effort: Pulmonary effort is normal.  Neurological:     Mental Status: She is confused.     Motor: Weakness present.  Psychiatric:        Cognition and Memory: Cognition is impaired.    Vital Signs: BP (!) 150/63   Pulse (!) 55   Temp 97.7 F (36.5 C) (Oral)   Resp 16   Ht '5\' 2"'$  (1.575 m)   Wt 54.4 kg   SpO2 100%   BMI 21.95 kg/m  Pain Scale: PAINAD     SpO2: SpO2: 100 % O2 Device:SpO2: 100 % O2 Flow Rate: .   IO: Intake/output summary:  Intake/Output Summary (Last 24 hours) at 01/06/2021 1308 Last data filed at 01/06/2021 0600 Gross per 24 hour  Intake 845.47 ml  Output --  Net 845.47 ml    Palliative Assessment/Data: PPS 20%    Time In: 1245 Time Out: 1355 Time Total: 70 minutes Greater than 50%  of this time was spent counseling and coordinating care  related to the above assessment and plan.  Signed by: Lavena Bullion, NP   Please contact Palliative Medicine Team phone at [phone] for questions and concerns.  For individual provider: See Shea Evans

## 2021-01-06 NOTE — H&P (Addendum)
History and Physical    Natalie Bullock [id number] DOB: 24-Oct-1924 DOA: 01/06/2021  Referring MD/NP/PA: Jennette Kettle PCP: Seward Carol, MD  Patient coming from: Transfer via EMS  Chief Complaint: Fall  I have personally briefly reviewed patient's old medical records in [place]   HPI: Natalie Bullock is a 87 y.o. female with medical history significant of hypertension, hyperlipidemia, CAD s/p CABG, CVA with residual deficit, hypothyroidism, and dementia.  History is mostly obtained from review of records.  It was reported that the patient was found around 4 AM minimally responsive on the floor by staff. While attempting to get the patient up in bed she slipped and fell hitting her head, staff were able to get her back into bed.  Heart rates were in the 0000000 with systolic blood pressure in the 80s and O2 saturations 80%.  She was been placed on oxygen.  They called patient's nephew who agreed to send in the patient to the hospital for evaluation. EMS was not made aware of the patient CODE STATUS.  Patient was noted to be bradycardic, hypotensive, and hypoxic for which she was intubated, placed on epinephrine drip, and paced.   The patient had just recently hospitalized 8/3-8/5 for possible seizure versus syncope.  Family had noted a progressive decline over the last few months.  They had declined further work-up with MRI and preferred to hold off on Keppra or other antiseizure medications given the negative neuro work-up.  Patient's CODE STATUS was eventually confirmed by her nephew of DNR/DNI and had a MOST form stating that she did not want to be hospitalized. The patient is alert at this time after being extubated and complaints of having a fall with discomfort in her left leg and eye pain.  ED Course: Patient arrived to the emergency department intubated with pacing.  She had been transitioned to the [facility] as no pacing was noted to be required.  Tracings initially noted SVT versus A.  fib/flutter.  Chest x-ray noted support apparatus with low lung volumes and scarring of the left mid to lower lung.  EEG noted mild diffuse encephalopathy without signs of seizure activity.  Labs significant for hemoglobin 10.3, BUN 15, creatinine 1.02, albumin 3.1, and high-sensitivity troponin 17.  CT scan of the brain showed no acute abnormality.  After being in contact with the patient's nephew it was confirmed that the patient should be extubated with goals of care comfort care measures only.  Review of Systems  Eyes:  Positive for pain.  Musculoskeletal:  Positive for falls and myalgias.   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 EF 60-70%   CARDIOVASCULAR STRESS TEST  Aug 2010  Normal; EF 74%   CORONARY ARTERY BYPASS GRAFT  12/2000   LEFT INTERNAL MAMMARY TO THE LAD, SAPHENOUS VEIN GRAFT TO THE FIRST DIAGONAL, SAPHENOUS VEIN GRAFT TO THE RAMUS INTERMEDIATE, AND A SEQUENTIAL SAPHENOUS VEIN GRAFT TO THE POSTERIOR DESCENDING AND POSTERIOR LATERAL BRANCHES.   MASS EXCISION  04/08/2011   Procedure: EXCISION MASS;  Surgeon: Wynonia Sours, MD;  Location: [facility];  Service: Orthopedics;  Laterality: Left;  excision mass left palm   MASTECTOMY  1975   RIGHT SIDE   TUBAL LIGATION     1960's     reports that she quit smoking about 51 years ago. Her smoking  use included cigarettes. She has a 4.00 pack-year smoking history. She has never used smokeless tobacco. She reports that she does not drink alcohol and does not use drugs.  Allergies  Allergen Reactions   Valium Shortness Of Breath and Other (See Comments)    "ALLERGIC," per MAR   Codeine Other (See Comments)    "ALLERGIC," per [facility]   Crestor [Rosuvastatin Calcium] Other (See Comments)    "ALLERGIC," per MAR   Doxycycline Other (See Comments)    "ALLERGIC," per MAR   Ezetimibe-Simvastatin Other (See Comments)    Vytorin- "ALLERGIC," per MAR   Lipitor [Atorvastatin Calcium] Other (See Comments)    "ALLERGIC," per MAR   Sulfa Drugs Cross Reactors Other (See Comments)    "ALLERGIC," per MAR   Tequin Other (See Comments)    "ALLERGIC," per MAR   Zocor [Simvastatin] Other (See Comments)    "ALLERGIC," per MAR    Family History  Problem Relation Age of Onset   Alzheimer's disease Mother     Prior to Admission medications   Medication Sig Start Date End Date Taking? Authorizing Provider  acetaminophen (TYLENOL) 325 MG tablet Take 650 mg by mouth 3 (three) times daily.   Yes [provider]  aspirin EC 81 MG tablet Take 1 tablet (81 mg total) by mouth daily with breakfast. 01/04/21 01/04/22 Yes Emokpae, Courage, MD  bisacodyl (DULCOLAX) 10 MG suppository Place 10 mg rectally See admin instructions. Qd prn  constipation if no relief from milk of mag   Yes [provider]  feeding supplement (ENSURE ENLIVE / ENSURE PLUS) LIQD Take 237 mLs by mouth 2 (two) times daily between meals. 01/04/21  Yes Emokpae, Courage, MD  furosemide (LASIX) 20 MG tablet Take 20 mg by mouth daily.   Yes [provider]  levothyroxine (SYNTHROID, LEVOTHROID) 50 MCG tablet Take 50 mcg by mouth daily before breakfast.   Yes [provider]  magnesium hydroxide (MILK OF MAGNESIA) 400 MG/5ML suspension Take 30 mLs by mouth daily as needed for mild constipation.   Yes [provider]  Menthol, Topical Analgesic, (BIOFREEZE) 4 % GEL Apply 1 application topically every 6 (six) hours as needed (left and right shoulder pain).   Yes [provider]  metoprolol tartrate (LOPRESSOR) 25 MG tablet Take 0.5 tablets (12.5 mg total) by mouth 2 (two) times daily. 01/04/21  Yes Roxan Hockey, MD  Multiple Vitamin (MULTIVITAMIN WITH MINERALS) TABS tablet Take 1 tablet by mouth daily. 01/04/21  Yes Roxan Hockey, MD  Polyethyl Glycol-Propyl Glycol (SYSTANE OP) Place 2 drops into both eyes every morning.   Yes [provider]  pravastatin (PRAVACHOL) 20 MG tablet Take 1 tablet (20 mg total) by mouth at bedtime. 11/30/20  Yes Hosie Poisson, MD  Sodium Phosphates (RA SALINE ENEMA RE) Place 1 enema rectally See admin  instructions. Qd prn constipation if no relief from milk of mag or bisacodyl suppository   Yes [provider]    Physical Exam:  Constitutional: Elderly female who is resting appears to be in no acute distress Vitals:   01/06/21 0620 01/06/21 0630 01/06/21 0635 01/06/21 0757  BP: 124/73 126/73 (!) 146/70 (!) 146/89  Pulse: 90 91 97 93  Resp: '17 16 20 '$ (!) 21  Temp:  98.3 F (36.8 C)    SpO2: 97% 98% 99% 97%  Weight:      Height:       Eyes: PERRL, lids and conjunctivae normal ENMT: Mucous membranes are moist. Posterior pharynx clear of any exudate or lesions. Edentulous.  Hard of hearing Neck: normal, supple, no masses, no thyromegaly Respiratory: Normal respiratory effort with no wheezes or crackles appreciated currently maintaining O2 saturations on room air. Cardiovascular: Regular rate and rhythm, no murmurs / rubs / gallops. No extremity edema. 2+ pedal pulses. No carotid bruits.  Abdomen: no tenderness, no masses palpated. No hepatosplenomegaly. Bowel sounds positive.  Musculoskeletal: no clubbing / cyanosis.  Soft tissue mass noted of the left lower leg.  Patient complains of discomfort with moving left leg Skin: no rashes,  lesions, ulcers. No induration Neurologic: CN 2-12 grossly intact.  Able to move extremities. Psychiatric: Alert, but only oriented to self.  Confused    Labs on Admission: I have personally reviewed following labs and imaging studies  CBC: Recent Labs  Lab 01/02/21 1820 01/03/21 0436 01/06/21 0512 01/06/21 0519  WBC 6.8 5.0  --  6.4  NEUTROABS 4.4 3.3  --  2.8  HGB 10.2*  10.5* 9.9* 10.5*  10.5* 10.3*  HCT 32.7*  31.0* 31.9* 31.0*  31.0* 33.0*  MCV 90.3 91.7  --  92.2  PLT 265 236  --  123456   Basic Metabolic Panel: Recent Labs  Lab 01/02/21 1820 01/03/21 0436 01/06/21 0512 01/06/21 0519  NA 139  142 138 142  142 141  K 3.7  3.9 3.8 3.5  3.5 3.6  CL 107  106 108 110 112*  CO2 24 22  --  20*  GLUCOSE 131*  127* 73 132* 133*  BUN 20  25* '16 15 15  '$ CREATININE 0.88  0.80 0.68 0.80 1.02*  CALCIUM 9.3 8.5*  --  9.3  MG  --  2.2  --  2.3  PHOS  --  3.3  --   --    GFR: Estimated Creatinine Clearance: 27.3 mL/min (A) (by C-G formula based on SCr of 1.02 mg/dL (H)). Liver Function Tests: Recent Labs  Lab 01/02/21 1820 01/03/21 0436 01/06/21 0519  AST 13* 15 16  ALT '11 10 8  '$ ALKPHOS 47 38 40  BILITOT 0.7 0.7 0.4  PROT 6.9 6.1* 6.3*  ALBUMIN 3.3* 3.0* 3.1*   No results for input(s): LIPASE, AMYLASE in the last 168 hours. No results for input(s): AMMONIA in the last 168 hours. Coagulation Profile: Recent Labs  Lab 01/02/21 1820  INR 1.0   Cardiac Enzymes: No results for input(s): CKTOTAL, CKMB, CKMBINDEX, TROPONINI in the last 168 hours. BNP (last 3 results) No results for input(s): PROBNP in the last 8760 hours. HbA1C: No results for input(s): HGBA1C in the last 72 hours. CBG: Recent Labs  Lab 01/02/21 1815 01/06/21 0504  GLUCAP 120* 112*   Lipid Profile: No results for input(s): CHOL, HDL, LDLCALC, TRIG, CHOLHDL, LDLDIRECT in the last 72 hours. Thyroid Function Tests: No results for input(s): TSH, T4TOTAL, FREET4,  T3FREE, THYROIDAB in  the last 72 hours. Anemia Panel: No results for input(s): VITAMINB12, FOLATE, FERRITIN, TIBC, IRON, RETICCTPCT in the last 72 hours. Urine analysis:    Component Value Date/Time   COLORURINE STRAW (A) 01/02/2021 2355   APPEARANCEUR CLEAR 01/02/2021 2355   LABSPEC 1.028 01/02/2021 2355   PHURINE 7.0 01/02/2021 2355   GLUCOSEU NEGATIVE 01/02/2021 2355   HGBUR NEGATIVE 01/02/2021 2355   BILIRUBINUR NEGATIVE 01/02/2021 2355   KETONESUR NEGATIVE 01/02/2021 2355   PROTEINUR NEGATIVE 01/02/2021 2355   UROBILINOGEN 1.0 03/26/2013 0624   NITRITE NEGATIVE 01/02/2021 2355   LEUKOCYTESUR NEGATIVE 01/02/2021 2355   Sepsis Labs: Recent Results (from the past 240 hour(s))  Resp Panel by RT-PCR (Flu A&B, Covid) Nasopharyngeal Swab     Status: None   Collection Time: 01/02/21  6:16 PM   Specimen: Nasopharyngeal Swab; Nasopharyngeal(NP) swabs in vial transport medium  Result Value Ref Range Status   SARS Coronavirus 2 by RT PCR NEGATIVE NEGATIVE Final    Comment: (NOTE) SARS-CoV-2 target nucleic acids are NOT DETECTED.  The SARS-CoV-2 RNA is generally detectable in upper respiratory specimens during the acute phase of infection. The lowest concentration of SARS-CoV-2 viral copies this assay can detect is 138 copies/mL. A negative result does not preclude SARS-Cov-2 infection and should not be used as the sole basis for treatment or other patient management decisions. A negative result may occur with  improper specimen collection/handling, submission of specimen other than nasopharyngeal swab, presence of viral mutation(s) within the areas targeted by this assay, and inadequate number of viral copies(<138 copies/mL). A negative result must be combined with clinical observations, patient history, and epidemiological information. The expected result is Negative.  Fact Sheet for Patients:  EntrepreneurPulse.com.au  Fact Sheet for Healthcare Providers:   IncredibleEmployment.be  This test is no t yet approved or cleared by the [place] FDA and  has been authorized for detection and/or diagnosis of SARS-CoV-2 by FDA under an Emergency Use Authorization (EUA). This EUA will remain  in effect (meaning this test can be used) for the duration of the COVID-19 declaration under Section 564(b)(1) of the Act, 21 U.S.C.section 360bbb-3(b)(1), unless the authorization is terminated  or revoked sooner.       Influenza A by PCR NEGATIVE NEGATIVE Final   Influenza B by PCR NEGATIVE NEGATIVE Final    Comment: (NOTE) The Xpert Xpress SARS-CoV-2/FLU/RSV plus assay is intended as an aid in the diagnosis of influenza from Nasopharyngeal swab specimens and should not be used as a sole basis for treatment. Nasal washings and aspirates are unacceptable for Xpert Xpress SARS-CoV-2/FLU/RSV testing.  Fact Sheet for Patients: EntrepreneurPulse.com.au  Fact Sheet for Healthcare Providers: IncredibleEmployment.be  This test is not yet approved or cleared by the [place] FDA and has been authorized for detection and/or diagnosis of SARS-CoV-2 by FDA under an Emergency Use Authorization (EUA). This EUA will remain in effect (meaning this test can be used) for the duration of the COVID-19 declaration under Section 564(b)(1) of the Act, 21 U.S.C. section 360bbb-3(b)(1), unless the authorization is terminated or revoked.  Performed at [facility], [address], [place], [place] [address]      Radiological Exams on Admission: CT HEAD WO CONTRAST (5MM)  Result Date: 01/06/2021 CLINICAL DATA:  Minor head trauma.  Found down and unresponsive EXAM: CT HEAD WITHOUT CONTRAST TECHNIQUE: Contiguous axial images were obtained from the base of the skull through the vertex without intravenous contrast. COMPARISON:  01/02/2021 FINDINGS: Brain: No  evidence of acute infarction, hemorrhage,  hydrocephalus, extra-axial collection or mass lesion/mass effect. Confluent chronic small vessel ischemia in the hemispheric white matter. Generalized brain atrophy Vascular: No hyperdense vessel or unexpected calcification. Skull: Normal. Negative for fracture or focal lesion. Sinuses/Orbits: No evidence of injury IMPRESSION: 1. No acute or interval finding. 2. Aging brain. Electronically Signed   By: Monte Fantasia M.D.   On: 01/06/2021 08:00   DG Chest Portable 1 View  Result Date: 01/06/2021 CLINICAL DATA:  87 year old female status post intubation. EXAM: PORTABLE CHEST 1 VIEW COMPARISON:  Chest x-ray 01/02/2021. FINDINGS: An endotracheal tube is in place with tip 8.8 cm above the carina. Transcutaneous defibrillator pads project over the lower left hemithorax. Lung volumes are low. Mild linear scarring in the left mid to lower lung, similar to the prior study. No acute consolidative airspace disease. No pleural effusions. No pneumothorax. No evidence of pulmonary edema. Heart size is normal. Aortic atherosclerosis. Status post median sternotomy for CABG. IMPRESSION: 1. Support apparatus, as above. 2. Low lung volumes with mild scarring in the left mid to lower lung. 3. Aortic atherosclerosis. Electronically Signed   By: Vinnie Langton M.D.   On: 01/06/2021 05:44   Overnight EEG with video  Result Date: 01/04/2021 Lora Havens, MD     01/04/2021  6:29 PM Patient Name: Natalie Bullock MRN: [id number] Epilepsy Attending: Lora Havens Referring Physician/Provider: Anibal Henderson, NP Duration: 01/03/2021 1027 to 01/04/2021 1230 Patient history:  87 y.o. female who presented to the ED for evaluation of acute onset decreased responsiveness, right-sided neglect, and right gaze preference.  EEG to evaluate for seizures. Level of alertness: Awake, asleep AEDs during EEG study: LEV Technical aspects: This EEG study was done with scalp electrodes positioned according to the 10-20 International system of electrode  placement. Electrical activity was acquired at a sampling rate of '500Hz'$  and reviewed with a high frequency filter of '70Hz'$  and a low frequency filter of '1Hz'$ . EEG data were recorded continuously and digitally stored. Description: The posterior dominant rhythm consists of 8 Hz activity of moderate voltage (25-35 uV) seen predominantly in posterior head regions, symmetric and reactive to eye opening and eye closing. Sleep was characterized by vertex waves, sleep spindles (12 to 14 Hz), maximal frontocentral region.  Intermittent generalized 3 to '5Hz'$  theta- delta slowing was also noted.  Hyperventilation and photic stimulation were not performed.   Parts of study were difficult to interpret due to significant electrode artifact as patient was not electrodes. ABNORMALITY - Intermittent slow, generalized IMPRESSION: This technically difficult study is suggestive of mild diffuse encephalopathy, nonspecific etiology. No seizures or epileptiform discharges were seen throughout the recording. Priyanka [name]    EKG: Independently reviewed.  Sinus rhythm at 70 bpm  Assessment/Plan Comfort measures only status: Patient appears to have had a progressive decline and after falling last night family recommending comfort care measures only at this time.  She had initially been intubated with EMS and placed on epinephrine drip with pacing.  These measures were discontinued. -Admit to a palliative care bed -Aspiration precautions with elevation of head of bed -Dysphagia diet 3 as tolerated -Discontinue cardiac monitoring -Okay for RN to pronounce -Morphine as needed for pain -Haldol as needed for agitation -Glycopyrrolate as needed for excessive secretions -Zofran as needed for nausea -Palliative care consulted with plans to transfer to [facility] once a bed becomes preventable  Fall with head contusion: Patient had a fall at the nursing facility and reportedly hit her  head.  CT scan of the brain showed no acute  abnormalities.  Acute respiratory failure with hypoxia: Patient was initially noted to be hypoxic for which she was intubated temporarily.  After CODE STATUS was confirmed patient was extubated and O2 saturations appear to be currently maintained on room air.  Dementia: Patient is confused, but seemed to know that she had fallen.  Paroxysmal atrial fibrillation: Patient was initially noted to be bradycardic, but in route Zoll pads noted SVT vs. atrial fibrillation/atrial flutter.  Held metoprolol due to initial bradycardia  CAD s/p CABG  Anemia chronic disease: Hemoglobin is stable at 10.3  Hypothyroidism: TSH was noted to be 1.78 on 8/4 -Continue levothyroxine    DVT prophylaxis: none  Code Status: DNR/DNI Disposition Plan: To be determined Consults called: Palliative care Admission status: Inpatient, likely to require more than 2 midnight stay  Norval Morton MD [facility]   If 7PM-7AM, please contact night-coverage   01/06/2021, 8:05 AM

## 2021-01-06 NOTE — ED Notes (Signed)
Bear Hugger applied to patient.

## 2021-01-06 NOTE — TOC Initial Note (Signed)
Transition of Care [facility]) - Initial/Assessment Note    Patient Details  Name: Natalie Bullock MRN: [id number] Date of Birth: 10/18/1924  Transition of Care [facility]) CM/SW Contact:    Verdell Carmine, RN Phone Number: 01/06/2021, 10:57 AM  Clinical Narrative:                  87 YO in with unresponsiveness, fell at [facility]. Patient obtunded and was intubated as there was no MOST/DNR sheet onhte chart. Nephew is NOK, contacted and comfort measures sought. Extubated. Was followed by palliative care at [facility] Recommend [facility] hospice.   Expected Discharge Plan: [place] Barriers to Discharge: Continued Medical Work up   Patient Goals and CMS Choice        Expected Discharge Plan and Services Expected Discharge Plan: [place]       Living arrangements for the past 2 months: [place]                                      Prior Living Arrangements/Services Living arrangements for the past 2 months: [place] Lives with:: Facility Resident Patient language and need for interpreter reviewed:: Yes        Need for Family Participation in Patient Care: Yes (Comment) Care giver support system in place?: Yes (comment)   Criminal Activity/Legal Involvement Pertinent to Current Situation/Hospitalization: No - Comment as needed  Activities of Daily Living      Permission Sought/Granted                  Emotional Assessment   Attitude/Demeanor/Rapport: Unable to Assess Affect (typically observed): Unable to Assess Orientation: :  (obtunded) Alcohol / Substance Use: Not Applicable Psych Involvement: No (comment)  Admission diagnosis:  Comfort measures only status [Z51.5] Patient Active Problem List   Diagnosis Date Noted   Comfort measures only status 01/06/2021   Protein-calorie malnutrition, severe 01/04/2021   Neurological abnormality 01/03/2021   Dementia ([place]) 01/03/2021   Neurologic abnormality  01/03/2021   Acute encephalopathy 01/02/2021   Memory loss    Goals of care, counseling/discussion    Palliative care by specialist    GI bleed 11/27/2020   Encephalopathy 11/03/2020   Fall    Orthostasis    Mitral regurgitation 03/07/2014   Near syncope 03/26/2013   Delirium 09/12/2011   HTN (hypertension) 08/17/2011   HX: breast cancer 08/14/2011   Hypothyroidism 08/14/2011   Hx of CABG 11/19/2010   Hyperlipidemia 11/19/2010   PCP:  Seward Carol, MD Pharmacy:   [place], [place] - [id number] [place] [id number] Phone: [phone] Fax: [phone]  [id number] [address] [place]: [phone]: [phone]    Social Determinants of Health ([place] Interventions    Readmission Risk Interventions No flowsheet data found.

## 2021-01-06 NOTE — ED Triage Notes (Signed)
Pt BIBA via [facility] from facility. Per EMS pt found down and unresponsive at facility. Pt arrived intubated and paced at 220J from medic. Central pulses, femoral and carotid, palpable. IV access established with medic to right arm. Pt received about 200cc of fluid from medic via IV. Pt unresponsive to voice and slightly responsive to pain.

## 2021-01-07 DIAGNOSIS — J9601 Acute respiratory failure with hypoxia: Secondary | ICD-10-CM

## 2021-01-07 DIAGNOSIS — F0390 Unspecified dementia without behavioral disturbance: Secondary | ICD-10-CM

## 2021-01-07 NOTE — Progress Notes (Addendum)
Manufacturing engineer [facility])   Addendum:  Pt has been referred for hospice at [facility]. Chart and pt to be reviewed by [facility] MD for hospice eligibility.   Spoke with POA nephew [name] to initiate education on hospice services in SNF.  Mark verbalizes understanding of information, agrees that this is the support he would like for his aunt.  Please be sure the DNR form transports with the pt at time of discharge.  Please provide prescriptions for medications to ensure pt's comfort until pt can be admitted onto hospice services.  [facility] liaisons will continue to follow to assist with coordinating admission onto hospice services. _____________________________________    This patient is currently followed by palliative care services in the community.  [facility] will continue to follow for any discharge planning needs and to coordinate continuation of palliative care.    Thank you for the opportunity to participate in this patient's care.     Domenic Moras, BSN, RN [facility] Liaison [phone] (24h on call)

## 2021-01-07 NOTE — Progress Notes (Signed)
Daily Progress Note   Patient Name: Natalie Bullock       Date: 01/07/2021 DOB: 02/23/1925  Age: 87 y.o. MRN#: [id number] Attending Physician: Norval Morton, MD Primary Care Physician: Seward Carol, MD Admit Date: 01/06/2021  Reason for Consultation/Follow-up: end of life care, symptom management  Subjective: Patient has improved clinically overnight. Alert and talkative. Ate most of her meal trays. She is clearly not appropriate for [facility] at this time, but would be eligible for hospice services at the SNF. I called [employer] and confirmed that her status is long-term care and she is private pay.   I spoke with her nephew [name] and updated him that she is not appropriate for [employer] [facility]. He agrees with plan to discharge back to SNF with hospice services. Discussed that she would likely be eligible for [facility] in the next few weeks or months. We discussed the unfortunate situation at SNF and through EMS not being aware of her code status that resulted in hospitalization. [name] is clear that he wishes to avoid hospitalization in the future. Discussed that hospice will help support this goal.   Additional time spent in coordination of care among attending MD, [facility] team, and hospice liaison.    Length of Stay: 1  Current Medications: Scheduled Meds:   levothyroxine  50 mcg Oral QAC breakfast   morphine CONCENTRATE  5 mg Sublingual Q6H   sodium chloride flush  3 mL Intravenous Q12H    Continuous Infusions:  sodium chloride      PRN Meds: sodium chloride, acetaminophen **OR** acetaminophen, albuterol, bisacodyl, camphor-menthol, glycopyrrolate **OR** glycopyrrolate **OR** glycopyrrolate, haloperidol **OR** haloperidol **OR** haloperidol lactate, morphine injection, ondansetron  **OR** ondansetron (ZOFRAN) IV, polyvinyl alcohol, sodium chloride flush, sodium phosphate  Physical Exam Constitutional:      General: She is not in acute distress.    Comments: Frail appearing  Pulmonary:     Effort: Pulmonary effort is normal.  Neurological:     Mental Status: She is alert.     Comments: Oriented to self  Psychiatric:        Cognition and Memory: Cognition is impaired.            Vital Signs: BP (!) 151/61 (BP Location: Right Arm)   Pulse 83   Temp 97.7 F (36.5 C) (Oral)  Resp 19   Ht '5\' 2"'$  (1.575 m)   Wt 54.4 kg   SpO2 100%   BMI 21.95 kg/m  SpO2: SpO2: 100 % O2 Device: O2 Device: Room Air O2 Flow Rate:    Intake/output summary: No intake or output data in the 24 hours ending 01/07/21 1533 LBM: Last BM Date: 01/07/21 Baseline Weight: Weight: 54.4 kg Most recent weight: Weight: 54.4 kg       Palliative Assessment/Data: PPS 30%      Palliative Care Assessment & Plan   HPI/Patient Profile: 87 y.o. female  with past medical history of vascular dementia, CAD s/p CABG, previous MI, paroxysmal a-fib not on anticoagulation due to high fall risk, breast cancer s/p right mastectomy, hypothyroidism, HLD, and HTN. She presented to the emergency department on 01/06/2021 after being found on the floor and unresponsive at her facility. Staff called patient's nephew who agreed to send patient in for evaluation. In route, patient noted to be bradycardic, hypotensive, and hypoxic. She was intubated in route by EMS as they had not been made aware of her code status. She also required epinephrine drip and pacing. On arrival to the ED, zoll tracings noted SVT versus a-fib/flutter. Chest x-ray without acute abnormality. CT head showed no acute abnormality. EEG noted mild diffuse encephalopathy without signs of seizure activity. After contact with patient's nephew it was confirmed that code status is DNR/DNI with MOST form stating she did not want to be hospitalized. Patient  was extubated with goals of comfort only.  Assessment: - dementia - acute hypoxic respiratory failure - need for pain management - comfort measures  Recommendations/Plan: Continue comfort care Continue scheduled morphine concentrate solution 5 mg every 6 hours Plan for discharge back to SNF with hospice PMT will continue to follow  Goals of Care and Additional Recommendations: Limitations on Scope of Treatment: Avoid Hospitalization, Full Comfort Care, and No Artificial Feeding  Code Status: DNR  Prognosis:  < 6 months  Discharge Planning: [place] with Hospice    Thank you for allowing the Palliative Medicine Team to assist in the care of this patient.   Total Time 35 minutes Prolonged Time Billed  no       Greater than 50%  of this time was spent counseling and coordinating care related to the above assessment and plan.  Lavena Bullion, NP  Please contact Palliative Medicine Team phone at [phone] for questions and concerns.

## 2021-01-07 NOTE — Plan of Care (Signed)
  Problem: Nutrition: Goal: Adequate nutrition will be maintained Outcome: Progressing   Problem: Pain Managment: Goal: General experience of comfort will improve Outcome: Progressing   Problem: Safety: Goal: Ability to remain free from injury will improve Outcome: Progressing   Problem: Skin Integrity: Goal: Risk for impaired skin integrity will decrease Outcome: Progressing   Problem: Coping: Goal: Level of anxiety will decrease Outcome: Progressing

## 2021-01-07 NOTE — Progress Notes (Signed)
PROGRESS NOTE    Natalie Bullock  [id number] DOB: [birth year] DOA: 01/06/2021 PCP: Seward Carol, MD   Brief Narrative:  Natalie Bullock is a 87 y.o. female with medical history significant of hypertension, hyperlipidemia, CAD s/p CABG, CVA with residual deficit, hypothyroidism, and dementia.  Patient was found down overnight at facility with hypotension bradycardia and hypoxia, unfortunately in route patient's DNR CODE STATUS was not made privileged to EMS who ultimately placed patient on epinephrine drip paced her as well as intubated the patient due to her status.  Patient's CODE STATUS was confirmed at admission as DNR/DNI, MOST form was noted to refrain from hospitalization.  Patient was subsequently extubated and comfort measures were enacted.  Patient is awake alert and oriented to at least person this afternoon, appears quite well, we will continue to monitor over the next 12 hours and pending patient's clinical course likely discharge back to facility to continue comfort measures with goals to remain from being rehospitalized or admitted to any acute care facility per patient's wishes and make note the patient is DNR/DNI and these wishes should be respected.  Assessment & Plan:   Principal Problem:   Comfort measures only status Active Problems:   Hx of CABG   Hyperlipidemia   Hypothyroidism   Fall   Dementia ([place])   Acute respiratory failure with hypoxia (HCC)   PAF (paroxysmal atrial fibrillation) (HCC)   Anemia, chronic disease   Comfort measures/goals of care -Patient extubated as above, no longer being paced, epinephrine drip discontinued  -Palliative care following, appreciate insight and recommendations  -Comfort feedings, activity as needed, pain control and analgesics/anxiolytics for discomfort per palliative  -Patient will likely not meet criteria for [facility] given her improvement over the past 12 hours and will likely be discharged back to previous facility  Fall  with head contusion: Patient had a fall at the nursing facility and reportedly hit her head.  CT scan of the brain showed no acute abnormalities.   Acute respiratory failure with hypoxia: Resolved, patient extubated currently on room air without hypoxia   Dementia: Patient is confused, and appears to be at baseline.   Paroxysmal atrial fibrillation/bradycardia -resolved, currently rate controlled on her own, will hold off on beta-blockers given reported bradycardia   CAD s/p CABG   Anemia chronic disease: Hemoglobin is stable at 10.3   Hypothyroidism: TSH was noted to be 1.78 on 8/4 -Continue levothyroxine   DVT prophylaxis: None Code Status: DNR/DNI - confirmed with palliative/MOST/Family Family Communication: At admission  Status is: Inpt  Dispo: The patient is from: Facility              Anticipated d/c is to: Same              Anticipated d/c date is: 24h              Patient currently NOT medically stable for discharge  Consultants:  None  Procedures:  Intubated/extubated 8/7-8/8  Antimicrobials:  None   Subjective: No acute issues/events this am - ROS limited but unremarkable  Objective: Vitals:   01/07/21 0300 01/07/21 0400 01/07/21 0600 01/07/21 0700  BP: (!) 111/51 (!) 105/44 (!) 133/44 130/61  Pulse: (!) 56 61 66 (!) 51  Resp:  16 15   Temp:   98.1 F (36.7 C)   TempSrc:   Oral   SpO2: 100% 100% 100% 100%  Weight:      Height:       No intake or output  data in the 24 hours ending 01/07/21 0830 Filed Weights   01/06/21 0500  Weight: 54.4 kg    Examination:  General exam: Appears calm and comfortable  Respiratory system: Clear to auscultation. Respiratory effort normal. Cardiovascular system: S1 & S2 heard, RRR. No JVD, murmurs, rubs, gallops or clicks. No pedal edema. Gastrointestinal system: Abdomen is nondistended, soft and nontender. No organomegaly or masses felt. Normal bowel sounds heard. Central nervous system: Alert and oriented. No  focal neurological deficits. Extremities: Symmetric 5 x 5 power. Skin: No rashes, lesions or ulcers Psychiatry: Judgement and insight limited by baseline dementia  Data Reviewed: I have personally reviewed following labs and imaging studies  CBC: Recent Labs  Lab 01/02/21 1820 01/03/21 0436 01/06/21 0512 01/06/21 0519  WBC 6.8 5.0  --  6.4  NEUTROABS 4.4 3.3  --  2.8  HGB 10.2*  10.5* 9.9* 10.5*  10.5* 10.3*  HCT 32.7*  31.0* 31.9* 31.0*  31.0* 33.0*  MCV 90.3 91.7  --  92.2  PLT 265 236  --  [id number]   Basic Metabolic Panel: Recent Labs  Lab 01/02/21 1820 01/03/21 0436 01/06/21 0512 01/06/21 0519  NA 139  142 138 142  142 141  K 3.7  3.9 3.8 3.5  3.5 3.6  CL 107  106 108 110 112*  CO2 24 22  --  20*  GLUCOSE 131*  127* 73 132* 133*  BUN 20  25* '16 15 15  '$ CREATININE 0.88  0.80 0.68 0.80 1.02*  CALCIUM 9.3 8.5*  --  9.3  MG  --  2.2  --  2.3  PHOS  --  3.3  --   --    GFR: Estimated Creatinine Clearance: 27.3 mL/min (A) (by C-G formula based on SCr of 1.02 mg/dL (H)). Liver Function Tests: Recent Labs  Lab 01/02/21 1820 01/03/21 0436 01/06/21 0519  AST 13* 15 16  ALT '11 10 8  '$ ALKPHOS 47 38 40  BILITOT 0.7 0.7 0.4  PROT 6.9 6.1* 6.3*  ALBUMIN 3.3* 3.0* 3.1*   No results for input(s): LIPASE, AMYLASE in the last 168 hours. No results for input(s): AMMONIA in the last 168 hours. Coagulation Profile: Recent Labs  Lab 01/02/21 1820  INR 1.0   Cardiac Enzymes: No results for input(s): CKTOTAL, CKMB, CKMBINDEX, TROPONINI in the last 168 hours. BNP (last 3 results) No results for input(s): PROBNP in the last 8760 hours. HbA1C: No results for input(s): HGBA1C in the last 72 hours. CBG: Recent Labs  Lab 01/02/21 1815 01/06/21 0504  GLUCAP 120* 112*   Lipid Profile: No results for input(s): CHOL, HDL, LDLCALC, TRIG, CHOLHDL, LDLDIRECT in the last 72 hours. Thyroid Function Tests: No results for input(s): TSH, T4TOTAL, FREET4, T3FREE, THYROIDAB  in the last 72 hours. Anemia Panel: No results for input(s): VITAMINB12, FOLATE, FERRITIN, TIBC, IRON, RETICCTPCT in the last 72 hours. Sepsis Labs: No results for input(s): PROCALCITON, LATICACIDVEN in the last 168 hours.  Recent Results (from the past 240 hour(s))  Resp Panel by RT-PCR (Flu A&B, Covid) Nasopharyngeal Swab     Status: None   Collection Time: 01/02/21  6:16 PM   Specimen: Nasopharyngeal Swab; Nasopharyngeal(NP) swabs in vial transport medium  Result Value Ref Range Status   SARS Coronavirus 2 by RT PCR NEGATIVE NEGATIVE Final    Comment: (NOTE) SARS-CoV-2 target nucleic acids are NOT DETECTED.  The SARS-CoV-2 RNA is generally detectable in upper respiratory specimens during the acute phase of infection. The lowest concentration of SARS-CoV-2  viral copies this assay can detect is 138 copies/mL. A negative result does not preclude SARS-Cov-2 infection and should not be used as the sole basis for treatment or other patient management decisions. A negative result may occur with  improper specimen collection/handling, submission of specimen other than nasopharyngeal swab, presence of viral mutation(s) within the areas targeted by this assay, and inadequate number of viral copies(<138 copies/mL). A negative result must be combined with clinical observations, patient history, and epidemiological information. The expected result is Negative.  Fact Sheet for Patients:  EntrepreneurPulse.com.au  Fact Sheet for Healthcare Providers:  IncredibleEmployment.be  This test is no t yet approved or cleared by the [place] FDA and  has been authorized for detection and/or diagnosis of SARS-CoV-2 by FDA under an Emergency Use Authorization (EUA). This EUA will remain  in effect (meaning this test can be used) for the duration of the COVID-19 declaration under Section 564(b)(1) of the Act, 21 U.S.C.section 360bbb-3(b)(1), unless the  authorization is terminated  or revoked sooner.       Influenza A by PCR NEGATIVE NEGATIVE Final   Influenza B by PCR NEGATIVE NEGATIVE Final    Comment: (NOTE) The Xpert Xpress SARS-CoV-2/FLU/RSV plus assay is intended as an aid in the diagnosis of influenza from Nasopharyngeal swab specimens and should not be used as a sole basis for treatment. Nasal washings and aspirates are unacceptable for Xpert Xpress SARS-CoV-2/FLU/RSV testing.  Fact Sheet for Patients: EntrepreneurPulse.com.au  Fact Sheet for Healthcare Providers: IncredibleEmployment.be  This test is not yet approved or cleared by the [place] FDA and has been authorized for detection and/or diagnosis of SARS-CoV-2 by FDA under an Emergency Use Authorization (EUA). This EUA will remain in effect (meaning this test can be used) for the duration of the COVID-19 declaration under Section 564(b)(1) of the Act, 21 U.S.C. section 360bbb-3(b)(1), unless the authorization is terminated or revoked.  Performed at [facility], [place] [address] [place], [place] [address]   SARS CORONAVIRUS 2 (TAT 6-24 HRS) Nasopharyngeal Nasopharyngeal Swab     Status: None   Collection Time: 01/06/21  6:01 AM   Specimen: Nasopharyngeal Swab  Result Value Ref Range Status   SARS Coronavirus 2 NEGATIVE NEGATIVE Final    Comment: (NOTE) SARS-CoV-2 target nucleic acids are NOT DETECTED.  The SARS-CoV-2 RNA is generally detectable in upper and lower respiratory specimens during the acute phase of infection. Negative results do not preclude SARS-CoV-2 infection, do not rule out co-infections with other pathogens, and should not be used as the sole basis for treatment or other patient management decisions. Negative results must be combined with clinical observations, patient history, and epidemiological information. The expected result is Negative.  Fact Sheet for  Patients: SugarRoll.be  Fact Sheet for Healthcare Providers: https://www.woods-mathews.com/  This test is not yet approved or cleared by the [place] FDA and  has been authorized for detection and/or diagnosis of SARS-CoV-2 by FDA under an Emergency Use Authorization (EUA). This EUA will remain  in effect (meaning this test can be used) for the duration of the COVID-19 declaration under Se ction 564(b)(1) of the Act, 21 U.S.C. section 360bbb-3(b)(1), unless the authorization is terminated or revoked sooner.  Performed at [facility], [place] [address], [place], [place] [address]          Radiology Studies: CT HEAD WO CONTRAST (5MM)  Result Date: 01/06/2021 CLINICAL DATA:  Minor head trauma.  Found down and unresponsive EXAM: CT HEAD WITHOUT CONTRAST TECHNIQUE:  Contiguous axial images were obtained from the base of the skull through the vertex without intravenous contrast. COMPARISON:  01/02/2021 FINDINGS: Brain: No evidence of acute infarction, hemorrhage, hydrocephalus, extra-axial collection or mass lesion/mass effect. Confluent chronic small vessel ischemia in the hemispheric white matter. Generalized brain atrophy Vascular: No hyperdense vessel or unexpected calcification. Skull: Normal. Negative for fracture or focal lesion. Sinuses/Orbits: No evidence of injury IMPRESSION: 1. No acute or interval finding. 2. Aging brain. Electronically Signed   By: Monte Fantasia M.D.   On: 01/06/2021 08:00   DG Chest Portable 1 View  Result Date: 01/06/2021 CLINICAL DATA:  87 year old female status post intubation. EXAM: PORTABLE CHEST 1 VIEW COMPARISON:  Chest x-ray 01/02/2021. FINDINGS: An endotracheal tube is in place with tip 8.8 cm above the carina. Transcutaneous defibrillator pads project over the lower left hemithorax. Lung volumes are low. Mild linear scarring in the left mid to lower lung, similar to the prior study. No acute consolidative  airspace disease. No pleural effusions. No pneumothorax. No evidence of pulmonary edema. Heart size is normal. Aortic atherosclerosis. Status post median sternotomy for CABG. IMPRESSION: 1. Support apparatus, as above. 2. Low lung volumes with mild scarring in the left mid to lower lung. 3. Aortic atherosclerosis. Electronically Signed   By: Vinnie Langton M.D.   On: 01/06/2021 05:44        Scheduled Meds:  levothyroxine  50 mcg Oral QAC breakfast   morphine CONCENTRATE  5 mg Sublingual Q6H   Polyethyl Glycol-Propyl Glycol   Both Eyes q morning   sodium chloride flush  3 mL Intravenous Q12H   Continuous Infusions:  sodium chloride       LOS: 1 day   Time spent: 72mn  Jmya Uliano C Acelynn Dejonge, DO [facility]  If 7PM-7AM, please contact night-coverage [email or web address]  01/07/2021, 8:30 AM

## 2021-01-07 NOTE — Plan of Care (Signed)
  Problem: Elimination: Goal: Will not experience complications related to bowel motility Outcome: Progressing   Problem: Elimination: Goal: Will not experience complications related to urinary retention Outcome: Progressing   Problem: Pain Managment: Goal: General experience of comfort will improve Outcome: Progressing   

## 2021-01-08 DIAGNOSIS — Z7189 Other specified counseling: Secondary | ICD-10-CM

## 2021-01-08 MED ORDER — MORPHINE SULFATE (CONCENTRATE) 10 MG/0.5ML PO SOLN: 5.0000 mg | Freq: Four times a day (QID) | ORAL | 0 refills | Status: AC

## 2021-01-08 NOTE — Discharge Summary (Addendum)
Physician Discharge Summary  Natalie Bullock [id number] DOB: 1925-05-16 DOA: 01/06/2021  PCP: Seward Carol, MD  Admit date: 01/06/2021 Discharge date: 01/08/2021  Admitted From: Facility Disposition:  Same  Recommendations for Outpatient Follow-up:  Follow up with PCP/Hospice as necessary  Discharge Condition:Guarded  CODE STATUS:DNR/DNR  Diet recommendation: As tolerated  Brief/Interim Summary: Natalie Bullock is a 87 y.o. female with medical history significant of hypertension, hyperlipidemia, CAD s/p CABG, CVA with residual deficit, hypothyroidism, and dementia.  Patient was found down overnight at facility with hypotension bradycardia and hypoxia, unfortunately in route patient's DNR CODE STATUS was not made privilege to EMS who ultimately placed patient on epinephrine drip paced her as well as intubated the patient due to her status.  Patient's CODE STATUS was confirmed at admission as DNR/DNI, MOST form was noted to refrain from hospitalization.  Patient was subsequently extubated and comfort measures were enacted.  Patient is awake alert and oriented to at least person this afternoon, appears quite well, we will continue to monitor over the next 12 hours and pending patient's clinical course likely discharge back to facility to continue comfort measures with goals to remain from being rehospitalized or admitted to any acute care facility per patient's wishes and make note the patient is DNR/DNI and these wishes should be respected.  Comfort measures/goals of care -Patient extubated as above, no longer being paced, epinephrine drip discontinued  -Palliative care following, appreciate insight and recommendations  -Comfort feedings, activity as needed, pain control and analgesics/anxiolytics for discomfort per palliative  -Patient will likely not meet criteria for [facility] given her improvement - plan to DC back to previous facility.   Fall with head contusion: Patient had a fall at the  nursing facility and reportedly hit her head.  CT scan of the brain showed no acute abnormalities.   Acute respiratory failure with hypoxia: Resolved, patient extubated currently on room air without hypoxia   Dementia: Patient is confused, and appears to be at baseline.   Paroxysmal atrial fibrillation/bradycardia -resolved, currently rate controlled on her own, will hold off on beta-blockers given reported bradycardia   CAD s/p CABG   Anemia chronic disease: Hemoglobin is stable at 10.3   Hypothyroidism: TSH was noted to be 1.78 on 8/4 -Continue levothyroxine   Discharge Instructions  Discharge Instructions     Diet general   Complete by: As directed    As tolerated   Increase activity slowly   Complete by: As directed       Allergies as of 01/08/2021       Reactions   Valium Shortness Of Breath, Other (See Comments)   "ALLERGIC," per [facility]   Codeine Other (See Comments)   "ALLERGIC," per [facility]   Crestor [rosuvastatin Calcium] Other (See Comments)   "ALLERGIC," per [facility]   Doxycycline Other (See Comments)   "ALLERGIC," per [facility]   Ezetimibe-simvastatin Other (See Comments)   Vytorin- "ALLERGIC," per [facility]   Lipitor [atorvastatin Calcium] Other (See Comments)   "ALLERGIC," per [facility]   Sulfa Drugs Cross Reactors Other (See Comments)   "ALLERGIC," per [facility]   Tequin Other (See Comments)   "ALLERGIC," per [facility]   Zocor [simvastatin] Other (See Comments)   "ALLERGIC," per [facility]        Medication List     TAKE these medications    acetaminophen 325 MG tablet Commonly known as: TYLENOL Take 650 mg by mouth 3 (three) times daily.   aspirin EC 81 MG tablet Take 1 tablet (81  mg total) by mouth daily with breakfast.   Biofreeze 4 % Gel Generic drug: Menthol (Topical Analgesic) Apply 1 application topically every 6 (six) hours as needed (left and right shoulder pain).   bisacodyl 10 MG suppository Commonly known as: DULCOLAX Place 10 mg rectally See admin instructions. Qd prn   constipation if no relief from milk of mag   feeding supplement Liqd Take 237 mLs by mouth 2 (two) times daily between meals.   furosemide 20 MG tablet Commonly known as: LASIX Take 20 mg by mouth daily.   levothyroxine 50 MCG tablet Commonly known as: SYNTHROID Take 50 mcg by mouth daily before breakfast.   magnesium hydroxide 400 MG/5ML suspension Commonly known as: MILK OF MAGNESIA Take 30 mLs by mouth daily as needed for mild constipation.   metoprolol tartrate 25 MG tablet Commonly known as: LOPRESSOR Take 0.5 tablets (12.5 mg total) by mouth 2 (two) times daily.   morphine CONCENTRATE 10 MG/0.5ML Soln concentrated solution Place 0.25 mLs (5 mg total) under the tongue every 6 (six) hours.   multivitamin with minerals Tabs tablet Take 1 tablet by mouth daily.   pravastatin 20 MG tablet Commonly known as: PRAVACHOL Take 1 tablet (20 mg total) by mouth at bedtime.   RA SALINE ENEMA RE Place 1 enema rectally See admin instructions. Qd prn constipation if no relief from milk of mag or bisacodyl suppository   SYSTANE OP Place 2 drops into both eyes every morning.        Allergies  Allergen Reactions   Valium Shortness Of Breath and Other (See Comments)    "ALLERGIC," per [facility]   Codeine Other (See Comments)    "ALLERGIC," per [facility]   Crestor [Rosuvastatin Calcium] Other (See Comments)    "ALLERGIC," per [facility]   Doxycycline Other (See Comments)    "ALLERGIC," per [facility]   Ezetimibe-Simvastatin Other (See Comments)    Vytorin- "ALLERGIC," per [facility]   Lipitor [Atorvastatin Calcium] Other (See Comments)    "ALLERGIC," per [facility]   Sulfa Drugs Cross Reactors Other (See Comments)    "ALLERGIC," per [facility]   Tequin Other (See Comments)    "ALLERGIC," per [facility]   Zocor [Simvastatin] Other (See Comments)    "ALLERGIC," per [facility]    Consultations: None   Procedures/Studies: CT HEAD WO CONTRAST (5MM)  Result Date: 01/06/2021 CLINICAL DATA:  Minor head trauma.  Found down and  unresponsive EXAM: CT HEAD WITHOUT CONTRAST TECHNIQUE: Contiguous axial images were obtained from the base of the skull through the vertex without intravenous contrast. COMPARISON:  01/02/2021 FINDINGS: Brain: No evidence of acute infarction, hemorrhage, hydrocephalus, extra-axial collection or mass lesion/mass effect. Confluent chronic small vessel ischemia in the hemispheric white matter. Generalized brain atrophy Vascular: No hyperdense vessel or unexpected calcification. Skull: Normal. Negative for fracture or focal lesion. Sinuses/Orbits: No evidence of injury IMPRESSION: 1. No acute or interval finding. 2. Aging brain. Electronically Signed   By: Monte Fantasia M.D.   On: 01/06/2021 08:00   DG Chest Portable 1 View  Result Date: 01/06/2021 CLINICAL DATA:  87 year old female status post intubation. EXAM: PORTABLE CHEST 1 VIEW COMPARISON:  Chest x-ray 01/02/2021. FINDINGS: An endotracheal tube is in place with tip 8.8 cm above the carina. Transcutaneous defibrillator pads project over the lower left hemithorax. Lung volumes are low. Mild linear scarring in the left mid to lower lung, similar to the prior study. No acute consolidative airspace disease. No pleural effusions. No pneumothorax. No evidence of pulmonary edema. Heart size is  normal. Aortic atherosclerosis. Status post median sternotomy for CABG. IMPRESSION: 1. Support apparatus, as above. 2. Low lung volumes with mild scarring in the left mid to lower lung. 3. Aortic atherosclerosis. Electronically Signed   By: Vinnie Langton M.D.   On: 01/06/2021 05:44   DG Chest Port 1 View  Result Date: 01/02/2021 CLINICAL DATA:  Altered mental status EXAM: PORTABLE CHEST 1 VIEW COMPARISON:  11/03/2020 FINDINGS: Atherosclerotic calcification of the aortic arch. Prior CABG. Mild lingular scarring. Mildly low lung volumes. Prior right mastectomy. No blunting of the costophrenic angles. Mild thoracic spondylosis. IMPRESSION: 1. No acute thoracic findings. 2.   Aortic Atherosclerosis (ICD10-I70.0).  Prior CABG. 3. Chronic lingular scarring. Electronically Signed   By: Van Clines M.D.   On: 01/02/2021 19:20   Overnight EEG with video  Result Date: 01/04/2021 Lora Havens, MD     01/04/2021  6:29 PM Patient Name: Natalie Bullock MRN: [id number] Epilepsy Attending: Lora Havens Referring Physician/Provider: Anibal Henderson, NP Duration: 01/03/2021 1027 to 01/04/2021 1230 Patient history:  87 y.o. female who presented to the ED for evaluation of acute onset decreased responsiveness, right-sided neglect, and right gaze preference.  EEG to evaluate for seizures. Level of alertness: Awake, asleep AEDs during EEG study: LEV Technical aspects: This EEG study was done with scalp electrodes positioned according to the 10-20 International system of electrode placement. Electrical activity was acquired at a sampling rate of '500Hz'$  and reviewed with a high frequency filter of '70Hz'$  and a low frequency filter of '1Hz'$ . EEG data were recorded continuously and digitally stored. Description: The posterior dominant rhythm consists of 8 Hz activity of moderate voltage (25-35 uV) seen predominantly in posterior head regions, symmetric and reactive to eye opening and eye closing. Sleep was characterized by vertex waves, sleep spindles (12 to 14 Hz), maximal frontocentral region.  Intermittent generalized 3 to '5Hz'$  theta- delta slowing was also noted.  Hyperventilation and photic stimulation were not performed.   Parts of study were difficult to interpret due to significant electrode artifact as patient was not electrodes. ABNORMALITY - Intermittent slow, generalized IMPRESSION: This technically difficult study is suggestive of mild diffuse encephalopathy, nonspecific etiology. No seizures or epileptiform discharges were seen throughout the recording. Lora Havens   CT HEAD CODE STROKE WO CONTRAST  Result Date: 01/02/2021 CLINICAL DATA:  Code stroke.  Acute neuro deficit.  Aphasia  EXAM: CT HEAD WITHOUT CONTRAST TECHNIQUE: Contiguous axial images were obtained from the base of the skull through the vertex without intravenous contrast. COMPARISON:  CT head 12/05/2020 FINDINGS: Brain: Generalized atrophy. Negative for hydrocephalus. Moderate white matter changes with patchy white matter hypodensity bilaterally, unchanged. Negative for acute infarct, hemorrhage, mass Vascular: Negative for hyperdense vessel Skull: Negative Sinuses/Orbits: Negative Other: None ASPECTS ([place] Stroke Program Early CT Score) - Ganglionic level infarction (caudate, lentiform nuclei, internal capsule, insula, M1-M3 cortex): 7 - Supraganglionic infarction (M4-M6 cortex): 3 Total score (0-10 with 10 being normal): 10 IMPRESSION: 1. No acute intracranial abnormality 2. ASPECTS is 10 3. Atrophy and chronic microvascular ischemic change. 4. Code stroke imaging results were communicated on 01/02/2021 at 6:26 pm to provider Lorrin Goodell via text page Electronically Signed   By: Franchot Gallo M.D.   On: 01/02/2021 18:28   CT ANGIO HEAD CODE STROKE  Result Date: 01/02/2021 CLINICAL DATA:  Acute neuro deficit.  Aphasia EXAM: CT ANGIOGRAPHY HEAD AND NECK TECHNIQUE: Multidetector CT imaging of the head and neck was performed using the standard protocol during bolus administration of  intravenous contrast. Multiplanar CT image reconstructions and MIPs were obtained to evaluate the vascular anatomy. Carotid stenosis measurements (when applicable) are obtained utilizing NASCET criteria, using the distal internal carotid diameter as the denominator. CONTRAST:  161m OMNIPAQUE IOHEXOL 350 MG/ML SOLN COMPARISON:  CT head 01/02/2021 FINDINGS: CTA NECK FINDINGS Aortic arch: Mild atherosclerotic disease aortic arch. Proximal great vessels widely patent. Right carotid system: Mild atherosclerotic calcification right carotid bifurcation. No significant stenosis. Left carotid system: Mild atherosclerotic calcification left carotid bulb.  Negative for stenosis. Vertebral arteries: Left vertebral artery dominant. Both vertebral arteries patent to the skull base without stenosis. Skeleton: Mild cervical spondylosis.  No acute skeletal abnormality. Other neck: 10 mm right thyroid nodule. No further imaging necessary based on nodule size. No mass or adenopathy. Upper chest: Lung apices clear bilaterally. Review of the MIP images confirms the above findings CTA HEAD FINDINGS Anterior circulation: Atherosclerotic calcification in the cavernous carotid bilaterally. No flow limiting stenosis. Anterior and middle cerebral arteries patent bilaterally without stenosis. Posterior circulation: Left vertebral artery supplies the basilar. Mild calcific stenosis at the skull base. Left PICA patent. Right vertebral artery ends in PICA. Basilar widely patent. Superior cerebellar and posterior cerebral arteries patent. No large vessel occlusion or flow limiting stenosis. Venous sinuses: Normal venous enhancement. Anatomic variants: None Review of the MIP images confirms the above findings IMPRESSION: 1. Negative for intracranial large vessel occlusion or significant stenosis. 2. Mild atherosclerotic disease in the carotid bifurcation bilaterally without flow limiting stenosis. No significant vertebral artery stenosis. Right vertebral artery ends in PICA. Electronically Signed   By: CFranchot GalloM.D.   On: 01/02/2021 18:43   CT ANGIO NECK CODE STROKE  Result Date: 01/02/2021 CLINICAL DATA:  Acute neuro deficit.  Aphasia EXAM: CT ANGIOGRAPHY HEAD AND NECK TECHNIQUE: Multidetector CT imaging of the head and neck was performed using the standard protocol during bolus administration of intravenous contrast. Multiplanar CT image reconstructions and MIPs were obtained to evaluate the vascular anatomy. Carotid stenosis measurements (when applicable) are obtained utilizing NASCET criteria, using the distal internal carotid diameter as the denominator. CONTRAST:  1021m OMNIPAQUE IOHEXOL 350 MG/ML SOLN COMPARISON:  CT head 01/02/2021 FINDINGS: CTA NECK FINDINGS Aortic arch: Mild atherosclerotic disease aortic arch. Proximal great vessels widely patent. Right carotid system: Mild atherosclerotic calcification right carotid bifurcation. No significant stenosis. Left carotid system: Mild atherosclerotic calcification left carotid bulb. Negative for stenosis. Vertebral arteries: Left vertebral artery dominant. Both vertebral arteries patent to the skull base without stenosis. Skeleton: Mild cervical spondylosis.  No acute skeletal abnormality. Other neck: 10 mm right thyroid nodule. No further imaging necessary based on nodule size. No mass or adenopathy. Upper chest: Lung apices clear bilaterally. Review of the MIP images confirms the above findings CTA HEAD FINDINGS Anterior circulation: Atherosclerotic calcification in the cavernous carotid bilaterally. No flow limiting stenosis. Anterior and middle cerebral arteries patent bilaterally without stenosis. Posterior circulation: Left vertebral artery supplies the basilar. Mild calcific stenosis at the skull base. Left PICA patent. Right vertebral artery ends in PICA. Basilar widely patent. Superior cerebellar and posterior cerebral arteries patent. No large vessel occlusion or flow limiting stenosis. Venous sinuses: Normal venous enhancement. Anatomic variants: None Review of the MIP images confirms the above findings IMPRESSION: 1. Negative for intracranial large vessel occlusion or significant stenosis. 2. Mild atherosclerotic disease in the carotid bifurcation bilaterally without flow limiting stenosis. No significant vertebral artery stenosis. Right vertebral artery ends in PICA. Electronically Signed   By: ChFranchot Gallo.D.  On: 01/02/2021 18:43     Subjective: No acute issues/events overnight   Discharge Exam: Vitals:   01/07/21 1203 01/07/21 1327  BP: (!) 111/42 (!) 151/61  Pulse: 72 83  Resp:  19  Temp:  97.7 F  (36.5 C)  SpO2: 100% 100%   Vitals:   01/07/21 0955 01/07/21 1000 01/07/21 1203 01/07/21 1327  BP: (!) 148/55 (!) 143/60 (!) 111/42 (!) 151/61  Pulse: (!) 48 (!) 46 72 83  Resp: 15   19  Temp:    97.7 F (36.5 C)  TempSrc:    Oral  SpO2: 100% 100% 100% 100%  Weight:      Height:        General: Pt is awake, not in acute distress Cardiovascular: RRR, S1/S2 +, no rubs, no gallops Respiratory: CTA bilaterally, no wheezing, no rhonchi Abdominal: Soft, NT, ND, bowel sounds + Extremities: no edema, no cyanosis   The results of significant diagnostics from this hospitalization (including imaging, microbiology, ancillary and laboratory) are listed below for reference.     Microbiology: Recent Results (from the past 240 hour(s))  Resp Panel by RT-PCR (Flu A&B, Covid) Nasopharyngeal Swab     Status: None   Collection Time: 01/02/21  6:16 PM   Specimen: Nasopharyngeal Swab; Nasopharyngeal(NP) swabs in vial transport medium  Result Value Ref Range Status   SARS Coronavirus 2 by RT PCR NEGATIVE NEGATIVE Final    Comment: (NOTE) SARS-CoV-2 target nucleic acids are NOT DETECTED.  The SARS-CoV-2 RNA is generally detectable in upper respiratory specimens during the acute phase of infection. The lowest concentration of SARS-CoV-2 viral copies this assay can detect is 138 copies/mL. A negative result does not preclude SARS-Cov-2 infection and should not be used as the sole basis for treatment or other patient management decisions. A negative result may occur with  improper specimen collection/handling, submission of specimen other than nasopharyngeal swab, presence of viral mutation(s) within the areas targeted by this assay, and inadequate number of viral copies(<138 copies/mL). A negative result must be combined with clinical observations, patient history, and epidemiological information. The expected result is Negative.  Fact Sheet for Patients:   EntrepreneurPulse.com.au  Fact Sheet for Healthcare Providers:  IncredibleEmployment.be  This test is no t yet approved or cleared by the [place] FDA and  has been authorized for detection and/or diagnosis of SARS-CoV-2 by FDA under an Emergency Use Authorization (EUA). This EUA will remain  in effect (meaning this test can be used) for the duration of the COVID-19 declaration under Section 564(b)(1) of the Act, 21 U.S.C.section 360bbb-3(b)(1), unless the authorization is terminated  or revoked sooner.       Influenza A by PCR NEGATIVE NEGATIVE Final   Influenza B by PCR NEGATIVE NEGATIVE Final    Comment: (NOTE) The Xpert Xpress SARS-CoV-2/FLU/RSV plus assay is intended as an aid in the diagnosis of influenza from Nasopharyngeal swab specimens and should not be used as a sole basis for treatment. Nasal washings and aspirates are unacceptable for Xpert Xpress SARS-CoV-2/FLU/RSV testing.  Fact Sheet for Patients: EntrepreneurPulse.com.au  Fact Sheet for Healthcare Providers: IncredibleEmployment.be  This test is not yet approved or cleared by the [place] FDA and has been authorized for detection and/or diagnosis of SARS-CoV-2 by FDA under an Emergency Use Authorization (EUA). This EUA will remain in effect (meaning this test can be used) for the duration of the COVID-19 declaration under Section 564(b)(1) of the Act, 21 U.S.C. section 360bbb-3(b)(1), unless the authorization is terminated or  revoked.  Performed at [facility], [place] [address], [place], [place] [address]   SARS CORONAVIRUS 2 (TAT 6-24 HRS) Nasopharyngeal Nasopharyngeal Swab     Status: None   Collection Time: 01/06/21  6:01 AM   Specimen: Nasopharyngeal Swab  Result Value Ref Range Status   SARS Coronavirus 2 NEGATIVE NEGATIVE Final    Comment: (NOTE) SARS-CoV-2 target nucleic acids are NOT DETECTED.  The  SARS-CoV-2 RNA is generally detectable in upper and lower respiratory specimens during the acute phase of infection. Negative results do not preclude SARS-CoV-2 infection, do not rule out co-infections with other pathogens, and should not be used as the sole basis for treatment or other patient management decisions. Negative results must be combined with clinical observations, patient history, and epidemiological information. The expected result is Negative.  Fact Sheet for Patients: SugarRoll.be  Fact Sheet for Healthcare Providers: https://www.woods-mathews.com/  This test is not yet approved or cleared by the [place] FDA and  has been authorized for detection and/or diagnosis of SARS-CoV-2 by FDA under an Emergency Use Authorization (EUA). This EUA will remain  in effect (meaning this test can be used) for the duration of the COVID-19 declaration under Se ction 564(b)(1) of the Act, 21 U.S.C. section 360bbb-3(b)(1), unless the authorization is terminated or revoked sooner.  Performed at [facility], [place] [address] [place], [place] [address]      Labs: BNP (last 3 results) Recent Labs    01/06/21 0519  BNP [id number]*   Basic Metabolic Panel: Recent Labs  Lab 01/02/21 1820 01/03/21 0436 01/06/21 0512 01/06/21 0519  NA 139  142 138 142  142 141  K 3.7  3.9 3.8 3.5  3.5 3.6  CL 107  106 108 110 112*  CO2 24 22  --  20*  GLUCOSE 131*  127* 73 132* 133*  BUN 20  25* '16 15 15  '$ CREATININE 0.88  0.80 0.68 0.80 1.02*  CALCIUM 9.3 8.5*  --  9.3  MG  --  2.2  --  2.3  PHOS  --  3.3  --   --    Liver Function Tests: Recent Labs  Lab 01/02/21 1820 01/03/21 0436 01/06/21 0519  AST 13* 15 16  ALT '11 10 8  '$ ALKPHOS 47 38 40  BILITOT 0.7 0.7 0.4  PROT 6.9 6.1* 6.3*  ALBUMIN 3.3* 3.0* 3.1*   No results for input(s): LIPASE, AMYLASE in the last 168 hours. No results for input(s): AMMONIA in the last 168  hours. CBC: Recent Labs  Lab 01/02/21 1820 01/03/21 0436 01/06/21 0512 01/06/21 0519  WBC 6.8 5.0  --  6.4  NEUTROABS 4.4 3.3  --  2.8  HGB 10.2*  10.5* 9.9* 10.5*  10.5* 10.3*  HCT 32.7*  31.0* 31.9* 31.0*  31.0* 33.0*  MCV 90.3 91.7  --  92.2  PLT 265 236  --  253   Cardiac Enzymes: No results for input(s): CKTOTAL, CKMB, CKMBINDEX, TROPONINI in the last 168 hours. BNP: Invalid input(s): POCBNP CBG: Recent Labs  Lab 01/02/21 1815 01/06/21 0504  GLUCAP 120* 112*   D-Dimer No results for input(s): DDIMER in the last 72 hours. Hgb A1c No results for input(s): HGBA1C in the last 72 hours. Lipid Profile No results for input(s): CHOL, HDL, LDLCALC, TRIG, CHOLHDL, LDLDIRECT in the last 72 hours. Thyroid function studies No results for input(s): TSH, T4TOTAL, T3FREE, THYROIDAB in the last 72 hours.  Invalid input(s): FREET3 Anemia work up No  results for input(s): VITAMINB12, FOLATE, FERRITIN, TIBC, IRON, RETICCTPCT in the last 72 hours. Urinalysis    Component Value Date/Time   COLORURINE STRAW (A) 01/02/2021 2355   APPEARANCEUR CLEAR 01/02/2021 2355   LABSPEC 1.028 01/02/2021 2355   PHURINE 7.0 01/02/2021 2355   GLUCOSEU NEGATIVE 01/02/2021 2355   HGBUR NEGATIVE 01/02/2021 2355   BILIRUBINUR NEGATIVE 01/02/2021 2355   KETONESUR NEGATIVE 01/02/2021 2355   PROTEINUR NEGATIVE 01/02/2021 2355   UROBILINOGEN 1.0 03/26/2013 0624   NITRITE NEGATIVE 01/02/2021 2355   LEUKOCYTESUR NEGATIVE 01/02/2021 2355   Sepsis Labs Invalid input(s): PROCALCITONIN,  WBC,  LACTICIDVEN Microbiology Recent Results (from the past 240 hour(s))  Resp Panel by RT-PCR (Flu A&B, Covid) Nasopharyngeal Swab     Status: None   Collection Time: 01/02/21  6:16 PM   Specimen: Nasopharyngeal Swab; Nasopharyngeal(NP) swabs in vial transport medium  Result Value Ref Range Status   SARS Coronavirus 2 by RT PCR NEGATIVE NEGATIVE Final    Comment: (NOTE) SARS-CoV-2 target nucleic acids are NOT  DETECTED.  The SARS-CoV-2 RNA is generally detectable in upper respiratory specimens during the acute phase of infection. The lowest concentration of SARS-CoV-2 viral copies this assay can detect is 138 copies/mL. A negative result does not preclude SARS-Cov-2 infection and should not be used as the sole basis for treatment or other patient management decisions. A negative result may occur with  improper specimen collection/handling, submission of specimen other than nasopharyngeal swab, presence of viral mutation(s) within the areas targeted by this assay, and inadequate number of viral copies(<138 copies/mL). A negative result must be combined with clinical observations, patient history, and epidemiological information. The expected result is Negative.  Fact Sheet for Patients:  EntrepreneurPulse.com.au  Fact Sheet for Healthcare Providers:  IncredibleEmployment.be  This test is no t yet approved or cleared by the [place] FDA and  has been authorized for detection and/or diagnosis of SARS-CoV-2 by FDA under an Emergency Use Authorization (EUA). This EUA will remain  in effect (meaning this test can be used) for the duration of the COVID-19 declaration under Section 564(b)(1) of the Act, 21 U.S.C.section 360bbb-3(b)(1), unless the authorization is terminated  or revoked sooner.       Influenza A by PCR NEGATIVE NEGATIVE Final   Influenza B by PCR NEGATIVE NEGATIVE Final    Comment: (NOTE) The Xpert Xpress SARS-CoV-2/FLU/RSV plus assay is intended as an aid in the diagnosis of influenza from Nasopharyngeal swab specimens and should not be used as a sole basis for treatment. Nasal washings and aspirates are unacceptable for Xpert Xpress SARS-CoV-2/FLU/RSV testing.  Fact Sheet for Patients: EntrepreneurPulse.com.au  Fact Sheet for Healthcare Providers: IncredibleEmployment.be  This test is not yet  approved or cleared by the [place] FDA and has been authorized for detection and/or diagnosis of SARS-CoV-2 by FDA under an Emergency Use Authorization (EUA). This EUA will remain in effect (meaning this test can be used) for the duration of the COVID-19 declaration under Section 564(b)(1) of the Act, 21 U.S.C. section 360bbb-3(b)(1), unless the authorization is terminated or revoked.  Performed at [facility], [place] [address], [place], [place] [address]   SARS CORONAVIRUS 2 (TAT 6-24 HRS) Nasopharyngeal Nasopharyngeal Swab     Status: None   Collection Time: 01/06/21  6:01 AM   Specimen: Nasopharyngeal Swab  Result Value Ref Range Status   SARS Coronavirus 2 NEGATIVE NEGATIVE Final    Comment: (NOTE) SARS-CoV-2 target nucleic acids are NOT DETECTED.  The SARS-CoV-2 RNA is  generally detectable in upper and lower respiratory specimens during the acute phase of infection. Negative results do not preclude SARS-CoV-2 infection, do not rule out co-infections with other pathogens, and should not be used as the sole basis for treatment or other patient management decisions. Negative results must be combined with clinical observations, patient history, and epidemiological information. The expected result is Negative.  Fact Sheet for Patients: SugarRoll.be  Fact Sheet for Healthcare Providers: https://www.woods-mathews.com/  This test is not yet approved or cleared by the [place] FDA and  has been authorized for detection and/or diagnosis of SARS-CoV-2 by FDA under an Emergency Use Authorization (EUA). This EUA will remain  in effect (meaning this test can be used) for the duration of the COVID-19 declaration under Se ction 564(b)(1) of the Act, 21 U.S.C. section 360bbb-3(b)(1), unless the authorization is terminated or revoked sooner.  Performed at [facility], [place] [address] [place], [place] [address]      Time  coordinating discharge: Over 30 minutes  SIGNED:   Little Ishikawa, DO [facility] 01/08/2021, 12:16 PM

## 2021-01-08 NOTE — TOC Transition Note (Signed)
Transition of Care (TOC) - CM/SW Discharge Note *[place] [phone] *Room number 313   Patient Details  Name: Natalie Bullock MRN: [id number] Date of Birth: 1925/02/23  Transition of Care [facility]) CM/SW Contact:  Sable Feil, LCSW Phone Number: 01/08/2021, 1:56 PM   Clinical Narrative:  Patient returning to [facility] with Hospice services. Will be transported by non-emergency ambulance transport. Milon Dikes [phone]) contacted and informed after transport called. Discharge clinicals transmitted to facility and admissions director Freda Munro called and message left regarding clinicals being sent.     Final next level of care: [place] ([place]) Barriers to Discharge: Barriers Resolved   Patient Goals and CMS Choice Patient states their goals for this hospitalization and ongoing recovery are:: Patient/family in agreement with patient returning to [facility] with Hospice services CMS Medicare.gov Compare Post Acute Care list provided to:: Other (Comment Required) (n/a) Choice offered to / list presented to : NA  Discharge Placement              Patient chooses bed at: [facility] Patient to be transferred to facility by: [place] Name of family member notified: Carlynn Herald [phone] Patient and family notified of of transfer: 01/08/21  Discharge Plan and Services                                     Social Determinants of Health (SDOH) Interventions  No SDOH interventions requested or needed at this time   Readmission Risk Interventions No flowsheet data found.

## 2021-01-08 NOTE — Progress Notes (Signed)
Pt d/c to SNF via [facility].  IV removed.

## 2021-01-08 NOTE — NC FL2 (Signed)
[place] LEVEL OF CARE SCREENING TOOL     IDENTIFICATION  Patient Name: Natalie Bullock Birthdate: 1924/11/24 Sex: female Admission Date (Current Location): 01/06/2021  [facility] and [place] Number:  Herbalist and Address:  [facility], [place] [address], [place], [place] [address]      Provider Number: [id number]  Attending Physician Name and Address:  Little Ishikawa, MD  Relative Name and Phone Number:  [name] - [phone]    Current Level of Care: Hospital Recommended Level of Care: [place] Prior Approval Number:    Date Approved/Denied:   PASRR Number:    Discharge Plan: SNF    Current Diagnoses: Patient Active Problem List   Diagnosis Date Noted   Comfort measures only status 01/06/2021   Acute respiratory failure with hypoxia ([place]) 01/06/2021   PAF (paroxysmal atrial fibrillation) ([place]) 01/06/2021   Anemia, chronic disease 01/06/2021   Protein-calorie malnutrition, severe 01/04/2021   Neurological abnormality 01/03/2021   Dementia ([place]) 01/03/2021   Neurologic abnormality 01/03/2021   Acute encephalopathy 01/02/2021   Memory loss    Goals of care, counseling/discussion    Palliative care by specialist    GI bleed 11/27/2020   Encephalopathy 11/03/2020   Fall    Orthostasis    Mitral regurgitation 03/07/2014   Near syncope 03/26/2013   Delirium 09/12/2011   HTN (hypertension) 08/17/2011   HX: breast cancer 08/14/2011   Hypothyroidism 08/14/2011   Hx of CABG 11/19/2010   Hyperlipidemia 11/19/2010    Orientation RESPIRATION BLADDER Height & Weight     Self  Normal Incontinent Weight: 120 lb (54.4 kg) Height:  '5\' 2"'$  (157.5 cm)  BEHAVIORAL SYMPTOMS/MOOD NEUROLOGICAL BOWEL NUTRITION STATUS      Incontinent Diet (DYS 3)  AMBULATORY STATUS COMMUNICATION OF NEEDS Skin   Extensive Assist Verbally Normal                       Personal Care Assistance Level of  Assistance  Bathing, Feeding, Dressing Bathing Assistance: Limited assistance Feeding assistance: Limited assistance Dressing Assistance: Limited assistance     Functional Limitations Info  Sight, Hearing, Speech Sight Info: Adequate Hearing Info: Adequate Speech Info: Adequate    SPECIAL CARE FACTORS FREQUENCY                       Contractures Contractures Info: Not present    Additional Factors Info  Code Status, Allergies Code Status Info: DNR Allergies Info: Valium, Codeine, Crestor (Rosuvastatin Calcium), Doxycycline, Ezetimibe-simvastatin, Lipitor (Atorvastatin Calcium), Sulfa Drugs Cross Reactors, Tequin, Zocor (Simvastatin)           Current Medications (01/08/2021):  This is the current hospital active medication list Current Facility-Administered Medications  Medication Dose Route Frequency Provider Last Rate Last Admin   0.9 %  sodium chloride infusion  250 mL Intravenous PRN Cardama, Grayce Sessions, MD       acetaminophen (TYLENOL) tablet 650 mg  650 mg Oral Q6H PRN Norval Morton, MD       Or   acetaminophen (TYLENOL) suppository 650 mg  650 mg Rectal Q6H PRN Fuller Plan A, MD       albuterol (PROVENTIL) (2.5 MG/3ML) 0.083% nebulizer solution 2.5 mg  2.5 mg Nebulization Q2H PRN Smith, Rondell A, MD       bisacodyl (DULCOLAX) suppository 10 mg  10 mg Rectal Daily PRN Norval Morton, MD  camphor-menthol (SARNA) lotion 1 application  1 application Topical [id number] PRN Fuller Plan A, MD       glycopyrrolate (ROBINUL) tablet 1 mg  1 mg Oral Q4H PRN Fuller Plan A, MD       Or   glycopyrrolate (ROBINUL) injection 0.2 mg  0.2 mg Subcutaneous Q4H PRN Fuller Plan A, MD       Or   glycopyrrolate (ROBINUL) injection 0.2 mg  0.2 mg Intravenous Q4H PRN Smith, Rondell A, MD       haloperidol (HALDOL) tablet 0.5 mg  0.5 mg Oral Q4H PRN Cardama, Grayce Sessions, MD       Or   haloperidol (HALDOL) 2 MG/ML solution 0.5 mg  0.5 mg Sublingual Q4H PRN Cardama,  Grayce Sessions, MD       Or   haloperidol lactate (HALDOL) injection 0.5 mg  0.5 mg Intravenous Q4H PRN Cardama, Grayce Sessions, MD   0.5 mg at 01/06/21 0556   levothyroxine (SYNTHROID) tablet 50 mcg  50 mcg Oral QAC breakfast Fuller Plan A, MD   50 mcg at 01/08/21 0457   morphine 2 MG/ML injection 1 mg  1 mg Intravenous Q3H PRN Lavena Bullion, NP       morphine CONCENTRATE 10 MG/0.5ML oral solution 5 mg  5 mg Sublingual Q6H McIlquham, Julia B, NP   5 mg at 01/08/21 0242   ondansetron (ZOFRAN-ODT) disintegrating tablet 4 mg  4 mg Oral Q6H PRN Norval Morton, MD       Or   ondansetron (ZOFRAN) injection 4 mg  4 mg Intravenous Q6H PRN Smith, Rondell A, MD       polyvinyl alcohol (LIQUIFILM TEARS) 1.4 % ophthalmic solution 1 drop  1 drop Both Eyes QID PRN Smith, Rondell A, MD       sodium chloride flush (NS) 0.9 % injection 3 mL  3 mL Intravenous Q12H Cardama, Grayce Sessions, MD   3 mL at 01/07/21 2153   sodium chloride flush (NS) 0.9 % injection 3 mL  3 mL Intravenous PRN Cardama, Grayce Sessions, MD       sodium phosphate (FLEET) 7-19 GM/118ML enema 1 enema  1 enema Rectal Daily PRN Norval Morton, MD         Discharge Medications: Please see discharge summary for a list of discharge medications.  Relevant Imaging Results:  Relevant Lab Results:   Additional Information ss# [id number]  Sable Feil, LCSW

## 2021-01-08 NOTE — Progress Notes (Signed)
DISCHARGE NOTE SNF Natalie Bullock to be discharged [name] per MD order. Patient verbalized understanding.  Skin clean, dry and intact without evidence of skin break down, no evidence of skin tears noted. IV catheter discontinued intact. Site without signs and symptoms of complications. Dressing and pressure applied. Pt denies pain at the site currently. No complaints noted.  Patient free of lines, drains, and wounds.   Discharge packet assembled. An After Visit Summary (AVS) was printed and given to the EMS personnel. Patient escorted via stretcher and discharged to [facility] via ambulance. Report called to accepting facility; all questions and concerns addressed.   Berneta Levins, RN

## 2021-01-08 NOTE — Consult Note (Signed)
   [facility] CM Inpatient Consult   01/08/2021  Natalie Bullock 1926/08/85 [id number]  [facility] [ACO] Patient: Medicare CMS DCE  Readmission reviewed: Patient from [facility] [LTC]  Patient screened for hospitalization with noted extreme high risk score for less than 7 days unplanned readmission risk.  Review of patient's medical record reveals patient is to return to a SNF with hospice.   Plan:  Will follow and alert [facility] RN with updates if patient returns to a [facility] affiliated facility with traditional Medicare.  For questions contact:   Natividad Brood, RN BSN [facility] Liaison  [phone] business mobile phone Toll free office [phone]  Fax number: [phone] [name]

## 2021-01-08 NOTE — Progress Notes (Addendum)
Daily Progress Note   Patient Name: Natalie Bullock       Date: 01/08/2021 DOB: 09/21/1924  Age: 87 y.o. MRN#: [id number] Attending Physician: Little Ishikawa, MD Primary Care Physician: Seward Carol, MD Admit Date: 01/06/2021  Reason for Consultation/Follow-up: goals of care  Subjective: I spoke with nephew/HCPOA [name] by phone. Advanced directives, concepts specific to code status, artifical feeding and hydration, and rehospitalization were considered and discussed.  We completed an electronic MOST together. The following treatment decisions were outlined:  Cardiopulmonary Resuscitation: Do Not Attempt Resuscitation (DNR/No CPR)  Medical Interventions: Comfort Measures: Keep clean, warm, and dry. Use medication by any route, positioning, wound care, and other measures to relieve pain and suffering. Use oxygen, suction and manual treatment of airway obstruction as needed for comfort. Do not transfer to the hospital unless comfort needs cannot be met in current location.  Antibiotics: Antibiotics if indicated  IV Fluids: No IV fluids (provide other measures to ensure comfort)  Feeding Tube: No feeding tube    Additional time spent in care coordination and communication with attending MD, TOC team, bedside RN, and hospice liaison.    Length of Stay: 2  Vital Signs: BP (!) 151/61 (BP Location: Right Arm)   Pulse 83   Temp 97.7 F (36.5 C) (Oral)   Resp 19   Ht 5' 2" (1.575 m)   Wt 54.4 kg   SpO2 100%   BMI 21.95 kg/m  SpO2: SpO2: 100 % O2 Device: O2 Device: Room Air O2 Flow Rate:     LBM: Last BM Date: 01/07/21 Baseline Weight: Weight: 54.4 kg Most recent weight: Weight: 54.4 kg       Palliative Assessment/Data: PPS 30%      Palliative Care Assessment & Plan    HPI/Patient Profile: 87 y.o. female  with past medical history of vascular dementia, CAD s/p CABG, previous MI, paroxysmal a-fib not on anticoagulation due to high fall risk, breast cancer s/p right mastectomy, hypothyroidism, HLD, and HTN. She presented to the emergency department on 01/06/2021 after being found on the floor and unresponsive at her facility. Staff called patient's nephew who agreed to send patient in for evaluation. In route, patient noted to be bradycardic, hypotensive, and hypoxic. She was intubated in route by EMS as they had not been made aware of her  code status. She also required epinephrine drip and pacing. On arrival to the ED, zoll tracings noted SVT versus a-fib/flutter. Chest x-ray without acute abnormality. CT head showed no acute abnormality. EEG noted mild diffuse encephalopathy without signs of seizure activity. After contact with patient's nephew it was confirmed that code status is DNR/DNI with MOST form stating she did not want to be hospitalized. Patient was extubated with goals of comfort only.   Assessment: - dementia - acute hypoxic respiratory failure - need for pain management - comfort measures   Recommendations/Plan: Pending discharge back to SNF with hospice Electronic MOST form completed - hard copy printed and placed in discharge packet  Continue at discharge: scheduled morphine concentrate solution 5 mg every 6 hours   Goals of Care and Additional Recommendations: Limitations on Scope of Treatment: Avoid Hospitalization, Full Comfort Care, and No Artificial Feeding  Code Status: DNR/DNI  Prognosis:  < 6 months  Discharge Planning: [facility] with Hospice   Thank you for allowing the Palliative Medicine Team to assist in the care of this patient.   Total Time 35 minutes Prolonged Time Billed  no       Greater than 50%  of this time was spent counseling and coordinating care related to the above assessment and  plan.  Lavena Bullion, NP  Please contact Palliative Medicine Team phone at [phone] for questions and concerns.

## 2021-01-09 DIAGNOSIS — Z853 Personal history of malignant neoplasm of breast: Secondary | ICD-10-CM | POA: Diagnosis not present

## 2021-01-09 DIAGNOSIS — H04129 Dry eye syndrome of unspecified lacrimal gland: Secondary | ICD-10-CM | POA: Diagnosis not present

## 2021-01-09 DIAGNOSIS — N183 Chronic kidney disease, stage 3 unspecified: Secondary | ICD-10-CM | POA: Diagnosis not present

## 2021-01-09 DIAGNOSIS — R001 Bradycardia, unspecified: Secondary | ICD-10-CM | POA: Diagnosis not present

## 2021-01-09 DIAGNOSIS — Z20822 Contact with and (suspected) exposure to covid-19: Secondary | ICD-10-CM | POA: Diagnosis not present

## 2021-01-09 DIAGNOSIS — I480 Paroxysmal atrial fibrillation: Secondary | ICD-10-CM | POA: Diagnosis not present

## 2021-01-09 DIAGNOSIS — Z9181 History of falling: Secondary | ICD-10-CM | POA: Diagnosis not present

## 2021-01-09 DIAGNOSIS — I10 Essential (primary) hypertension: Secondary | ICD-10-CM | POA: Diagnosis not present

## 2021-01-09 DIAGNOSIS — I2510 Atherosclerotic heart disease of native coronary artery without angina pectoris: Secondary | ICD-10-CM | POA: Diagnosis not present

## 2021-01-09 DIAGNOSIS — E559 Vitamin D deficiency, unspecified: Secondary | ICD-10-CM | POA: Diagnosis not present

## 2021-01-09 DIAGNOSIS — I69318 Other symptoms and signs involving cognitive functions following cerebral infarction: Secondary | ICD-10-CM | POA: Diagnosis not present

## 2021-01-09 DIAGNOSIS — F0391 Unspecified dementia with behavioral disturbance: Secondary | ICD-10-CM | POA: Diagnosis not present

## 2021-01-09 DIAGNOSIS — E785 Hyperlipidemia, unspecified: Secondary | ICD-10-CM | POA: Diagnosis not present

## 2021-01-09 DIAGNOSIS — E039 Hypothyroidism, unspecified: Secondary | ICD-10-CM | POA: Diagnosis not present

## 2021-01-09 DIAGNOSIS — F0390 Unspecified dementia without behavioral disturbance: Secondary | ICD-10-CM | POA: Diagnosis not present

## 2021-01-09 DIAGNOSIS — D631 Anemia in chronic kidney disease: Secondary | ICD-10-CM | POA: Diagnosis not present

## 2021-01-09 DIAGNOSIS — S0093XD Contusion of unspecified part of head, subsequent encounter: Secondary | ICD-10-CM | POA: Diagnosis not present

## 2021-01-09 DIAGNOSIS — I129 Hypertensive chronic kidney disease with stage 1 through stage 4 chronic kidney disease, or unspecified chronic kidney disease: Secondary | ICD-10-CM | POA: Diagnosis not present

## 2021-01-09 DIAGNOSIS — Z8709 Personal history of other diseases of the respiratory system: Secondary | ICD-10-CM | POA: Diagnosis not present

## 2021-01-09 DIAGNOSIS — I259 Chronic ischemic heart disease, unspecified: Secondary | ICD-10-CM | POA: Diagnosis not present

## 2021-01-10 DIAGNOSIS — F0390 Unspecified dementia without behavioral disturbance: Secondary | ICD-10-CM | POA: Diagnosis not present

## 2021-01-10 DIAGNOSIS — E785 Hyperlipidemia, unspecified: Secondary | ICD-10-CM | POA: Diagnosis not present

## 2021-01-10 DIAGNOSIS — D649 Anemia, unspecified: Secondary | ICD-10-CM | POA: Diagnosis not present

## 2021-01-10 DIAGNOSIS — D631 Anemia in chronic kidney disease: Secondary | ICD-10-CM | POA: Diagnosis not present

## 2021-01-10 DIAGNOSIS — N183 Chronic kidney disease, stage 3 unspecified: Secondary | ICD-10-CM | POA: Diagnosis not present

## 2021-01-10 DIAGNOSIS — I69318 Other symptoms and signs involving cognitive functions following cerebral infarction: Secondary | ICD-10-CM | POA: Diagnosis not present

## 2021-01-10 DIAGNOSIS — I129 Hypertensive chronic kidney disease with stage 1 through stage 4 chronic kidney disease, or unspecified chronic kidney disease: Secondary | ICD-10-CM | POA: Diagnosis not present

## 2021-01-14 DIAGNOSIS — I129 Hypertensive chronic kidney disease with stage 1 through stage 4 chronic kidney disease, or unspecified chronic kidney disease: Secondary | ICD-10-CM | POA: Diagnosis not present

## 2021-01-14 DIAGNOSIS — D631 Anemia in chronic kidney disease: Secondary | ICD-10-CM | POA: Diagnosis not present

## 2021-01-14 DIAGNOSIS — N183 Chronic kidney disease, stage 3 unspecified: Secondary | ICD-10-CM | POA: Diagnosis not present

## 2021-01-14 DIAGNOSIS — I69318 Other symptoms and signs involving cognitive functions following cerebral infarction: Secondary | ICD-10-CM | POA: Diagnosis not present

## 2021-01-14 DIAGNOSIS — E785 Hyperlipidemia, unspecified: Secondary | ICD-10-CM | POA: Diagnosis not present

## 2021-01-14 DIAGNOSIS — F0390 Unspecified dementia without behavioral disturbance: Secondary | ICD-10-CM | POA: Diagnosis not present

## 2021-01-15 DIAGNOSIS — Z20822 Contact with and (suspected) exposure to covid-19: Secondary | ICD-10-CM | POA: Diagnosis not present

## 2021-01-17 DIAGNOSIS — F0391 Unspecified dementia with behavioral disturbance: Secondary | ICD-10-CM | POA: Diagnosis not present

## 2021-01-17 DIAGNOSIS — I480 Paroxysmal atrial fibrillation: Secondary | ICD-10-CM | POA: Diagnosis not present

## 2021-01-17 DIAGNOSIS — R52 Pain, unspecified: Secondary | ICD-10-CM | POA: Diagnosis not present

## 2021-01-17 DIAGNOSIS — Z8719 Personal history of other diseases of the digestive system: Secondary | ICD-10-CM | POA: Diagnosis not present

## 2021-01-23 DIAGNOSIS — D631 Anemia in chronic kidney disease: Secondary | ICD-10-CM | POA: Diagnosis not present

## 2021-01-23 DIAGNOSIS — F0390 Unspecified dementia without behavioral disturbance: Secondary | ICD-10-CM | POA: Diagnosis not present

## 2021-01-23 DIAGNOSIS — E785 Hyperlipidemia, unspecified: Secondary | ICD-10-CM | POA: Diagnosis not present

## 2021-01-23 DIAGNOSIS — I69318 Other symptoms and signs involving cognitive functions following cerebral infarction: Secondary | ICD-10-CM | POA: Diagnosis not present

## 2021-01-23 DIAGNOSIS — I129 Hypertensive chronic kidney disease with stage 1 through stage 4 chronic kidney disease, or unspecified chronic kidney disease: Secondary | ICD-10-CM | POA: Diagnosis not present

## 2021-01-23 DIAGNOSIS — N183 Chronic kidney disease, stage 3 unspecified: Secondary | ICD-10-CM | POA: Diagnosis not present

## 2021-01-24 DIAGNOSIS — Z1339 Encounter for screening examination for other mental health and behavioral disorders: Secondary | ICD-10-CM | POA: Diagnosis not present

## 2021-01-24 DIAGNOSIS — Z1331 Encounter for screening for depression: Secondary | ICD-10-CM | POA: Diagnosis not present

## 2021-01-24 DIAGNOSIS — Z139 Encounter for screening, unspecified: Secondary | ICD-10-CM | POA: Diagnosis not present

## 2021-01-24 DIAGNOSIS — Z0000 Encounter for general adult medical examination without abnormal findings: Secondary | ICD-10-CM | POA: Diagnosis not present

## 2021-01-28 DIAGNOSIS — I69318 Other symptoms and signs involving cognitive functions following cerebral infarction: Secondary | ICD-10-CM | POA: Diagnosis not present

## 2021-01-28 DIAGNOSIS — E785 Hyperlipidemia, unspecified: Secondary | ICD-10-CM | POA: Diagnosis not present

## 2021-01-28 DIAGNOSIS — N183 Chronic kidney disease, stage 3 unspecified: Secondary | ICD-10-CM | POA: Diagnosis not present

## 2021-01-28 DIAGNOSIS — I129 Hypertensive chronic kidney disease with stage 1 through stage 4 chronic kidney disease, or unspecified chronic kidney disease: Secondary | ICD-10-CM | POA: Diagnosis not present

## 2021-01-28 DIAGNOSIS — D631 Anemia in chronic kidney disease: Secondary | ICD-10-CM | POA: Diagnosis not present

## 2021-01-28 DIAGNOSIS — F0390 Unspecified dementia without behavioral disturbance: Secondary | ICD-10-CM | POA: Diagnosis not present

## 2021-01-31 DIAGNOSIS — I480 Paroxysmal atrial fibrillation: Secondary | ICD-10-CM | POA: Diagnosis not present

## 2021-01-31 DIAGNOSIS — Z9181 History of falling: Secondary | ICD-10-CM | POA: Diagnosis not present

## 2021-01-31 DIAGNOSIS — Z8709 Personal history of other diseases of the respiratory system: Secondary | ICD-10-CM | POA: Diagnosis not present

## 2021-01-31 DIAGNOSIS — I129 Hypertensive chronic kidney disease with stage 1 through stage 4 chronic kidney disease, or unspecified chronic kidney disease: Secondary | ICD-10-CM | POA: Diagnosis not present

## 2021-01-31 DIAGNOSIS — E039 Hypothyroidism, unspecified: Secondary | ICD-10-CM | POA: Diagnosis not present

## 2021-01-31 DIAGNOSIS — R001 Bradycardia, unspecified: Secondary | ICD-10-CM | POA: Diagnosis not present

## 2021-01-31 DIAGNOSIS — Z853 Personal history of malignant neoplasm of breast: Secondary | ICD-10-CM | POA: Diagnosis not present

## 2021-01-31 DIAGNOSIS — I69318 Other symptoms and signs involving cognitive functions following cerebral infarction: Secondary | ICD-10-CM | POA: Diagnosis not present

## 2021-01-31 DIAGNOSIS — N183 Chronic kidney disease, stage 3 unspecified: Secondary | ICD-10-CM | POA: Diagnosis not present

## 2021-01-31 DIAGNOSIS — E785 Hyperlipidemia, unspecified: Secondary | ICD-10-CM | POA: Diagnosis not present

## 2021-01-31 DIAGNOSIS — H04129 Dry eye syndrome of unspecified lacrimal gland: Secondary | ICD-10-CM | POA: Diagnosis not present

## 2021-01-31 DIAGNOSIS — E559 Vitamin D deficiency, unspecified: Secondary | ICD-10-CM | POA: Diagnosis not present

## 2021-01-31 DIAGNOSIS — D631 Anemia in chronic kidney disease: Secondary | ICD-10-CM | POA: Diagnosis not present

## 2021-01-31 DIAGNOSIS — I259 Chronic ischemic heart disease, unspecified: Secondary | ICD-10-CM | POA: Diagnosis not present

## 2021-01-31 DIAGNOSIS — I2510 Atherosclerotic heart disease of native coronary artery without angina pectoris: Secondary | ICD-10-CM | POA: Diagnosis not present

## 2021-01-31 DIAGNOSIS — F0390 Unspecified dementia without behavioral disturbance: Secondary | ICD-10-CM | POA: Diagnosis not present

## 2021-01-31 DIAGNOSIS — S0093XD Contusion of unspecified part of head, subsequent encounter: Secondary | ICD-10-CM | POA: Diagnosis not present

## 2021-02-07 DIAGNOSIS — F0390 Unspecified dementia without behavioral disturbance: Secondary | ICD-10-CM | POA: Diagnosis not present

## 2021-02-07 DIAGNOSIS — D631 Anemia in chronic kidney disease: Secondary | ICD-10-CM | POA: Diagnosis not present

## 2021-02-07 DIAGNOSIS — N183 Chronic kidney disease, stage 3 unspecified: Secondary | ICD-10-CM | POA: Diagnosis not present

## 2021-02-07 DIAGNOSIS — I69318 Other symptoms and signs involving cognitive functions following cerebral infarction: Secondary | ICD-10-CM | POA: Diagnosis not present

## 2021-02-07 DIAGNOSIS — I129 Hypertensive chronic kidney disease with stage 1 through stage 4 chronic kidney disease, or unspecified chronic kidney disease: Secondary | ICD-10-CM | POA: Diagnosis not present

## 2021-02-07 DIAGNOSIS — E785 Hyperlipidemia, unspecified: Secondary | ICD-10-CM | POA: Diagnosis not present

## 2021-02-08 DIAGNOSIS — E785 Hyperlipidemia, unspecified: Secondary | ICD-10-CM | POA: Diagnosis not present

## 2021-02-08 DIAGNOSIS — D631 Anemia in chronic kidney disease: Secondary | ICD-10-CM | POA: Diagnosis not present

## 2021-02-08 DIAGNOSIS — I69318 Other symptoms and signs involving cognitive functions following cerebral infarction: Secondary | ICD-10-CM | POA: Diagnosis not present

## 2021-02-08 DIAGNOSIS — I129 Hypertensive chronic kidney disease with stage 1 through stage 4 chronic kidney disease, or unspecified chronic kidney disease: Secondary | ICD-10-CM | POA: Diagnosis not present

## 2021-02-08 DIAGNOSIS — F0390 Unspecified dementia without behavioral disturbance: Secondary | ICD-10-CM | POA: Diagnosis not present

## 2021-02-08 DIAGNOSIS — N183 Chronic kidney disease, stage 3 unspecified: Secondary | ICD-10-CM | POA: Diagnosis not present

## 2021-02-11 DIAGNOSIS — I129 Hypertensive chronic kidney disease with stage 1 through stage 4 chronic kidney disease, or unspecified chronic kidney disease: Secondary | ICD-10-CM | POA: Diagnosis not present

## 2021-02-11 DIAGNOSIS — N183 Chronic kidney disease, stage 3 unspecified: Secondary | ICD-10-CM | POA: Diagnosis not present

## 2021-02-11 DIAGNOSIS — I69318 Other symptoms and signs involving cognitive functions following cerebral infarction: Secondary | ICD-10-CM | POA: Diagnosis not present

## 2021-02-11 DIAGNOSIS — F0390 Unspecified dementia without behavioral disturbance: Secondary | ICD-10-CM | POA: Diagnosis not present

## 2021-02-11 DIAGNOSIS — D631 Anemia in chronic kidney disease: Secondary | ICD-10-CM | POA: Diagnosis not present

## 2021-02-11 DIAGNOSIS — E785 Hyperlipidemia, unspecified: Secondary | ICD-10-CM | POA: Diagnosis not present

## 2021-02-18 DIAGNOSIS — N183 Chronic kidney disease, stage 3 unspecified: Secondary | ICD-10-CM | POA: Diagnosis not present

## 2021-02-18 DIAGNOSIS — E785 Hyperlipidemia, unspecified: Secondary | ICD-10-CM | POA: Diagnosis not present

## 2021-02-18 DIAGNOSIS — I129 Hypertensive chronic kidney disease with stage 1 through stage 4 chronic kidney disease, or unspecified chronic kidney disease: Secondary | ICD-10-CM | POA: Diagnosis not present

## 2021-02-18 DIAGNOSIS — I69318 Other symptoms and signs involving cognitive functions following cerebral infarction: Secondary | ICD-10-CM | POA: Diagnosis not present

## 2021-02-18 DIAGNOSIS — D631 Anemia in chronic kidney disease: Secondary | ICD-10-CM | POA: Diagnosis not present

## 2021-02-18 DIAGNOSIS — F0390 Unspecified dementia without behavioral disturbance: Secondary | ICD-10-CM | POA: Diagnosis not present

## 2021-02-25 DIAGNOSIS — I129 Hypertensive chronic kidney disease with stage 1 through stage 4 chronic kidney disease, or unspecified chronic kidney disease: Secondary | ICD-10-CM | POA: Diagnosis not present

## 2021-02-25 DIAGNOSIS — E039 Hypothyroidism, unspecified: Secondary | ICD-10-CM | POA: Diagnosis not present

## 2021-02-25 DIAGNOSIS — I10 Essential (primary) hypertension: Secondary | ICD-10-CM | POA: Diagnosis not present

## 2021-02-25 DIAGNOSIS — I480 Paroxysmal atrial fibrillation: Secondary | ICD-10-CM | POA: Diagnosis not present

## 2021-02-25 DIAGNOSIS — D631 Anemia in chronic kidney disease: Secondary | ICD-10-CM | POA: Diagnosis not present

## 2021-02-25 DIAGNOSIS — N183 Chronic kidney disease, stage 3 unspecified: Secondary | ICD-10-CM | POA: Diagnosis not present

## 2021-02-25 DIAGNOSIS — F0391 Unspecified dementia with behavioral disturbance: Secondary | ICD-10-CM | POA: Diagnosis not present

## 2021-02-25 DIAGNOSIS — F0390 Unspecified dementia without behavioral disturbance: Secondary | ICD-10-CM | POA: Diagnosis not present

## 2021-02-25 DIAGNOSIS — E785 Hyperlipidemia, unspecified: Secondary | ICD-10-CM | POA: Diagnosis not present

## 2021-02-25 DIAGNOSIS — I69318 Other symptoms and signs involving cognitive functions following cerebral infarction: Secondary | ICD-10-CM | POA: Diagnosis not present

## 2021-03-02 DIAGNOSIS — D631 Anemia in chronic kidney disease: Secondary | ICD-10-CM | POA: Diagnosis not present

## 2021-03-02 DIAGNOSIS — N183 Chronic kidney disease, stage 3 unspecified: Secondary | ICD-10-CM | POA: Diagnosis not present

## 2021-03-02 DIAGNOSIS — H04129 Dry eye syndrome of unspecified lacrimal gland: Secondary | ICD-10-CM | POA: Diagnosis not present

## 2021-03-02 DIAGNOSIS — I259 Chronic ischemic heart disease, unspecified: Secondary | ICD-10-CM | POA: Diagnosis not present

## 2021-03-02 DIAGNOSIS — S0093XD Contusion of unspecified part of head, subsequent encounter: Secondary | ICD-10-CM | POA: Diagnosis not present

## 2021-03-02 DIAGNOSIS — I2510 Atherosclerotic heart disease of native coronary artery without angina pectoris: Secondary | ICD-10-CM | POA: Diagnosis not present

## 2021-03-02 DIAGNOSIS — Z9181 History of falling: Secondary | ICD-10-CM | POA: Diagnosis not present

## 2021-03-02 DIAGNOSIS — I480 Paroxysmal atrial fibrillation: Secondary | ICD-10-CM | POA: Diagnosis not present

## 2021-03-02 DIAGNOSIS — I129 Hypertensive chronic kidney disease with stage 1 through stage 4 chronic kidney disease, or unspecified chronic kidney disease: Secondary | ICD-10-CM | POA: Diagnosis not present

## 2021-03-02 DIAGNOSIS — F0390 Unspecified dementia without behavioral disturbance: Secondary | ICD-10-CM | POA: Diagnosis not present

## 2021-03-02 DIAGNOSIS — E785 Hyperlipidemia, unspecified: Secondary | ICD-10-CM | POA: Diagnosis not present

## 2021-03-02 DIAGNOSIS — R001 Bradycardia, unspecified: Secondary | ICD-10-CM | POA: Diagnosis not present

## 2021-03-02 DIAGNOSIS — I69318 Other symptoms and signs involving cognitive functions following cerebral infarction: Secondary | ICD-10-CM | POA: Diagnosis not present

## 2021-03-02 DIAGNOSIS — E039 Hypothyroidism, unspecified: Secondary | ICD-10-CM | POA: Diagnosis not present

## 2021-03-02 DIAGNOSIS — Z853 Personal history of malignant neoplasm of breast: Secondary | ICD-10-CM | POA: Diagnosis not present

## 2021-03-02 DIAGNOSIS — Z8709 Personal history of other diseases of the respiratory system: Secondary | ICD-10-CM | POA: Diagnosis not present

## 2021-03-02 DIAGNOSIS — E559 Vitamin D deficiency, unspecified: Secondary | ICD-10-CM | POA: Diagnosis not present

## 2021-03-07 DIAGNOSIS — N183 Chronic kidney disease, stage 3 unspecified: Secondary | ICD-10-CM | POA: Diagnosis not present

## 2021-03-07 DIAGNOSIS — F0390 Unspecified dementia without behavioral disturbance: Secondary | ICD-10-CM | POA: Diagnosis not present

## 2021-03-07 DIAGNOSIS — E785 Hyperlipidemia, unspecified: Secondary | ICD-10-CM | POA: Diagnosis not present

## 2021-03-07 DIAGNOSIS — I69318 Other symptoms and signs involving cognitive functions following cerebral infarction: Secondary | ICD-10-CM | POA: Diagnosis not present

## 2021-03-07 DIAGNOSIS — I129 Hypertensive chronic kidney disease with stage 1 through stage 4 chronic kidney disease, or unspecified chronic kidney disease: Secondary | ICD-10-CM | POA: Diagnosis not present

## 2021-03-07 DIAGNOSIS — D631 Anemia in chronic kidney disease: Secondary | ICD-10-CM | POA: Diagnosis not present

## 2021-03-14 DIAGNOSIS — F0390 Unspecified dementia without behavioral disturbance: Secondary | ICD-10-CM | POA: Diagnosis not present

## 2021-03-14 DIAGNOSIS — D631 Anemia in chronic kidney disease: Secondary | ICD-10-CM | POA: Diagnosis not present

## 2021-03-14 DIAGNOSIS — N183 Chronic kidney disease, stage 3 unspecified: Secondary | ICD-10-CM | POA: Diagnosis not present

## 2021-03-14 DIAGNOSIS — E785 Hyperlipidemia, unspecified: Secondary | ICD-10-CM | POA: Diagnosis not present

## 2021-03-14 DIAGNOSIS — I129 Hypertensive chronic kidney disease with stage 1 through stage 4 chronic kidney disease, or unspecified chronic kidney disease: Secondary | ICD-10-CM | POA: Diagnosis not present

## 2021-03-14 DIAGNOSIS — I69318 Other symptoms and signs involving cognitive functions following cerebral infarction: Secondary | ICD-10-CM | POA: Diagnosis not present

## 2021-03-18 DIAGNOSIS — I69318 Other symptoms and signs involving cognitive functions following cerebral infarction: Secondary | ICD-10-CM | POA: Diagnosis not present

## 2021-03-18 DIAGNOSIS — E785 Hyperlipidemia, unspecified: Secondary | ICD-10-CM | POA: Diagnosis not present

## 2021-03-18 DIAGNOSIS — I129 Hypertensive chronic kidney disease with stage 1 through stage 4 chronic kidney disease, or unspecified chronic kidney disease: Secondary | ICD-10-CM | POA: Diagnosis not present

## 2021-03-18 DIAGNOSIS — N183 Chronic kidney disease, stage 3 unspecified: Secondary | ICD-10-CM | POA: Diagnosis not present

## 2021-03-18 DIAGNOSIS — D631 Anemia in chronic kidney disease: Secondary | ICD-10-CM | POA: Diagnosis not present

## 2021-03-18 DIAGNOSIS — F0390 Unspecified dementia without behavioral disturbance: Secondary | ICD-10-CM | POA: Diagnosis not present

## 2021-03-25 DIAGNOSIS — I129 Hypertensive chronic kidney disease with stage 1 through stage 4 chronic kidney disease, or unspecified chronic kidney disease: Secondary | ICD-10-CM | POA: Diagnosis not present

## 2021-03-25 DIAGNOSIS — F0390 Unspecified dementia without behavioral disturbance: Secondary | ICD-10-CM | POA: Diagnosis not present

## 2021-03-25 DIAGNOSIS — I69318 Other symptoms and signs involving cognitive functions following cerebral infarction: Secondary | ICD-10-CM | POA: Diagnosis not present

## 2021-03-25 DIAGNOSIS — E785 Hyperlipidemia, unspecified: Secondary | ICD-10-CM | POA: Diagnosis not present

## 2021-03-25 DIAGNOSIS — N183 Chronic kidney disease, stage 3 unspecified: Secondary | ICD-10-CM | POA: Diagnosis not present

## 2021-03-25 DIAGNOSIS — D631 Anemia in chronic kidney disease: Secondary | ICD-10-CM | POA: Diagnosis not present

## 2021-03-29 DIAGNOSIS — E039 Hypothyroidism, unspecified: Secondary | ICD-10-CM | POA: Diagnosis not present

## 2021-03-29 DIAGNOSIS — F03918 Unspecified dementia, unspecified severity, with other behavioral disturbance: Secondary | ICD-10-CM | POA: Diagnosis not present

## 2021-03-29 DIAGNOSIS — M170 Bilateral primary osteoarthritis of knee: Secondary | ICD-10-CM | POA: Diagnosis not present

## 2021-03-29 DIAGNOSIS — I480 Paroxysmal atrial fibrillation: Secondary | ICD-10-CM | POA: Diagnosis not present

## 2021-04-02 DIAGNOSIS — I69318 Other symptoms and signs involving cognitive functions following cerebral infarction: Secondary | ICD-10-CM | POA: Diagnosis not present

## 2021-04-02 DIAGNOSIS — E785 Hyperlipidemia, unspecified: Secondary | ICD-10-CM | POA: Diagnosis not present

## 2021-04-02 DIAGNOSIS — F0390 Unspecified dementia without behavioral disturbance: Secondary | ICD-10-CM | POA: Diagnosis not present

## 2021-04-02 DIAGNOSIS — R001 Bradycardia, unspecified: Secondary | ICD-10-CM | POA: Diagnosis not present

## 2021-04-02 DIAGNOSIS — I480 Paroxysmal atrial fibrillation: Secondary | ICD-10-CM | POA: Diagnosis not present

## 2021-04-02 DIAGNOSIS — Z9181 History of falling: Secondary | ICD-10-CM | POA: Diagnosis not present

## 2021-04-02 DIAGNOSIS — I259 Chronic ischemic heart disease, unspecified: Secondary | ICD-10-CM | POA: Diagnosis not present

## 2021-04-02 DIAGNOSIS — Z8709 Personal history of other diseases of the respiratory system: Secondary | ICD-10-CM | POA: Diagnosis not present

## 2021-04-02 DIAGNOSIS — N183 Chronic kidney disease, stage 3 unspecified: Secondary | ICD-10-CM | POA: Diagnosis not present

## 2021-04-02 DIAGNOSIS — E039 Hypothyroidism, unspecified: Secondary | ICD-10-CM | POA: Diagnosis not present

## 2021-04-02 DIAGNOSIS — Z853 Personal history of malignant neoplasm of breast: Secondary | ICD-10-CM | POA: Diagnosis not present

## 2021-04-02 DIAGNOSIS — S0093XD Contusion of unspecified part of head, subsequent encounter: Secondary | ICD-10-CM | POA: Diagnosis not present

## 2021-04-02 DIAGNOSIS — E559 Vitamin D deficiency, unspecified: Secondary | ICD-10-CM | POA: Diagnosis not present

## 2021-04-02 DIAGNOSIS — I129 Hypertensive chronic kidney disease with stage 1 through stage 4 chronic kidney disease, or unspecified chronic kidney disease: Secondary | ICD-10-CM | POA: Diagnosis not present

## 2021-04-02 DIAGNOSIS — H04129 Dry eye syndrome of unspecified lacrimal gland: Secondary | ICD-10-CM | POA: Diagnosis not present

## 2021-04-02 DIAGNOSIS — D631 Anemia in chronic kidney disease: Secondary | ICD-10-CM | POA: Diagnosis not present

## 2021-04-02 DIAGNOSIS — I2510 Atherosclerotic heart disease of native coronary artery without angina pectoris: Secondary | ICD-10-CM | POA: Diagnosis not present

## 2021-04-05 DIAGNOSIS — Z23 Encounter for immunization: Secondary | ICD-10-CM | POA: Diagnosis not present

## 2021-04-07 DIAGNOSIS — R293 Abnormal posture: Secondary | ICD-10-CM | POA: Diagnosis not present

## 2021-04-07 DIAGNOSIS — G9340 Encephalopathy, unspecified: Secondary | ICD-10-CM | POA: Diagnosis not present

## 2021-04-07 DIAGNOSIS — M6281 Muscle weakness (generalized): Secondary | ICD-10-CM | POA: Diagnosis not present

## 2021-04-08 DIAGNOSIS — M6281 Muscle weakness (generalized): Secondary | ICD-10-CM | POA: Diagnosis not present

## 2021-04-08 DIAGNOSIS — R293 Abnormal posture: Secondary | ICD-10-CM | POA: Diagnosis not present

## 2021-04-08 DIAGNOSIS — G9340 Encephalopathy, unspecified: Secondary | ICD-10-CM | POA: Diagnosis not present

## 2021-04-09 DIAGNOSIS — M6281 Muscle weakness (generalized): Secondary | ICD-10-CM | POA: Diagnosis not present

## 2021-04-09 DIAGNOSIS — R293 Abnormal posture: Secondary | ICD-10-CM | POA: Diagnosis not present

## 2021-04-09 DIAGNOSIS — G9340 Encephalopathy, unspecified: Secondary | ICD-10-CM | POA: Diagnosis not present

## 2021-04-10 DIAGNOSIS — G9340 Encephalopathy, unspecified: Secondary | ICD-10-CM | POA: Diagnosis not present

## 2021-04-10 DIAGNOSIS — M6281 Muscle weakness (generalized): Secondary | ICD-10-CM | POA: Diagnosis not present

## 2021-04-10 DIAGNOSIS — R293 Abnormal posture: Secondary | ICD-10-CM | POA: Diagnosis not present

## 2021-04-11 DIAGNOSIS — G9340 Encephalopathy, unspecified: Secondary | ICD-10-CM | POA: Diagnosis not present

## 2021-04-11 DIAGNOSIS — E785 Hyperlipidemia, unspecified: Secondary | ICD-10-CM | POA: Diagnosis not present

## 2021-04-11 DIAGNOSIS — I129 Hypertensive chronic kidney disease with stage 1 through stage 4 chronic kidney disease, or unspecified chronic kidney disease: Secondary | ICD-10-CM | POA: Diagnosis not present

## 2021-04-11 DIAGNOSIS — D631 Anemia in chronic kidney disease: Secondary | ICD-10-CM | POA: Diagnosis not present

## 2021-04-11 DIAGNOSIS — I69318 Other symptoms and signs involving cognitive functions following cerebral infarction: Secondary | ICD-10-CM | POA: Diagnosis not present

## 2021-04-11 DIAGNOSIS — F0390 Unspecified dementia without behavioral disturbance: Secondary | ICD-10-CM | POA: Diagnosis not present

## 2021-04-11 DIAGNOSIS — M6281 Muscle weakness (generalized): Secondary | ICD-10-CM | POA: Diagnosis not present

## 2021-04-11 DIAGNOSIS — N183 Chronic kidney disease, stage 3 unspecified: Secondary | ICD-10-CM | POA: Diagnosis not present

## 2021-04-11 DIAGNOSIS — R293 Abnormal posture: Secondary | ICD-10-CM | POA: Diagnosis not present

## 2021-04-12 DIAGNOSIS — R293 Abnormal posture: Secondary | ICD-10-CM | POA: Diagnosis not present

## 2021-04-12 DIAGNOSIS — G9340 Encephalopathy, unspecified: Secondary | ICD-10-CM | POA: Diagnosis not present

## 2021-04-12 DIAGNOSIS — M6281 Muscle weakness (generalized): Secondary | ICD-10-CM | POA: Diagnosis not present

## 2021-04-15 DIAGNOSIS — G9340 Encephalopathy, unspecified: Secondary | ICD-10-CM | POA: Diagnosis not present

## 2021-04-15 DIAGNOSIS — I129 Hypertensive chronic kidney disease with stage 1 through stage 4 chronic kidney disease, or unspecified chronic kidney disease: Secondary | ICD-10-CM | POA: Diagnosis not present

## 2021-04-15 DIAGNOSIS — N183 Chronic kidney disease, stage 3 unspecified: Secondary | ICD-10-CM | POA: Diagnosis not present

## 2021-04-15 DIAGNOSIS — R293 Abnormal posture: Secondary | ICD-10-CM | POA: Diagnosis not present

## 2021-04-15 DIAGNOSIS — F0390 Unspecified dementia without behavioral disturbance: Secondary | ICD-10-CM | POA: Diagnosis not present

## 2021-04-15 DIAGNOSIS — M6281 Muscle weakness (generalized): Secondary | ICD-10-CM | POA: Diagnosis not present

## 2021-04-15 DIAGNOSIS — I69318 Other symptoms and signs involving cognitive functions following cerebral infarction: Secondary | ICD-10-CM | POA: Diagnosis not present

## 2021-04-15 DIAGNOSIS — D631 Anemia in chronic kidney disease: Secondary | ICD-10-CM | POA: Diagnosis not present

## 2021-04-15 DIAGNOSIS — E785 Hyperlipidemia, unspecified: Secondary | ICD-10-CM | POA: Diagnosis not present

## 2021-04-16 DIAGNOSIS — M6281 Muscle weakness (generalized): Secondary | ICD-10-CM | POA: Diagnosis not present

## 2021-04-16 DIAGNOSIS — G9340 Encephalopathy, unspecified: Secondary | ICD-10-CM | POA: Diagnosis not present

## 2021-04-16 DIAGNOSIS — R293 Abnormal posture: Secondary | ICD-10-CM | POA: Diagnosis not present

## 2021-04-17 DIAGNOSIS — M6281 Muscle weakness (generalized): Secondary | ICD-10-CM | POA: Diagnosis not present

## 2021-04-17 DIAGNOSIS — G9340 Encephalopathy, unspecified: Secondary | ICD-10-CM | POA: Diagnosis not present

## 2021-04-17 DIAGNOSIS — R293 Abnormal posture: Secondary | ICD-10-CM | POA: Diagnosis not present

## 2021-04-18 DIAGNOSIS — M6281 Muscle weakness (generalized): Secondary | ICD-10-CM | POA: Diagnosis not present

## 2021-04-18 DIAGNOSIS — R293 Abnormal posture: Secondary | ICD-10-CM | POA: Diagnosis not present

## 2021-04-18 DIAGNOSIS — G9340 Encephalopathy, unspecified: Secondary | ICD-10-CM | POA: Diagnosis not present

## 2021-04-19 DIAGNOSIS — G9340 Encephalopathy, unspecified: Secondary | ICD-10-CM | POA: Diagnosis not present

## 2021-04-19 DIAGNOSIS — M6281 Muscle weakness (generalized): Secondary | ICD-10-CM | POA: Diagnosis not present

## 2021-04-19 DIAGNOSIS — R293 Abnormal posture: Secondary | ICD-10-CM | POA: Diagnosis not present

## 2021-04-21 DIAGNOSIS — G9340 Encephalopathy, unspecified: Secondary | ICD-10-CM | POA: Diagnosis not present

## 2021-04-21 DIAGNOSIS — R293 Abnormal posture: Secondary | ICD-10-CM | POA: Diagnosis not present

## 2021-04-21 DIAGNOSIS — M6281 Muscle weakness (generalized): Secondary | ICD-10-CM | POA: Diagnosis not present

## 2021-04-22 DIAGNOSIS — G9340 Encephalopathy, unspecified: Secondary | ICD-10-CM | POA: Diagnosis not present

## 2021-04-22 DIAGNOSIS — M6281 Muscle weakness (generalized): Secondary | ICD-10-CM | POA: Diagnosis not present

## 2021-04-22 DIAGNOSIS — R293 Abnormal posture: Secondary | ICD-10-CM | POA: Diagnosis not present

## 2021-04-23 DIAGNOSIS — M6281 Muscle weakness (generalized): Secondary | ICD-10-CM | POA: Diagnosis not present

## 2021-04-23 DIAGNOSIS — R293 Abnormal posture: Secondary | ICD-10-CM | POA: Diagnosis not present

## 2021-04-23 DIAGNOSIS — G9340 Encephalopathy, unspecified: Secondary | ICD-10-CM | POA: Diagnosis not present

## 2021-04-24 DIAGNOSIS — F0390 Unspecified dementia without behavioral disturbance: Secondary | ICD-10-CM | POA: Diagnosis not present

## 2021-04-24 DIAGNOSIS — I69318 Other symptoms and signs involving cognitive functions following cerebral infarction: Secondary | ICD-10-CM | POA: Diagnosis not present

## 2021-04-24 DIAGNOSIS — I129 Hypertensive chronic kidney disease with stage 1 through stage 4 chronic kidney disease, or unspecified chronic kidney disease: Secondary | ICD-10-CM | POA: Diagnosis not present

## 2021-04-24 DIAGNOSIS — E785 Hyperlipidemia, unspecified: Secondary | ICD-10-CM | POA: Diagnosis not present

## 2021-04-24 DIAGNOSIS — G9340 Encephalopathy, unspecified: Secondary | ICD-10-CM | POA: Diagnosis not present

## 2021-04-24 DIAGNOSIS — R293 Abnormal posture: Secondary | ICD-10-CM | POA: Diagnosis not present

## 2021-04-24 DIAGNOSIS — M6281 Muscle weakness (generalized): Secondary | ICD-10-CM | POA: Diagnosis not present

## 2021-04-24 DIAGNOSIS — D631 Anemia in chronic kidney disease: Secondary | ICD-10-CM | POA: Diagnosis not present

## 2021-04-24 DIAGNOSIS — N183 Chronic kidney disease, stage 3 unspecified: Secondary | ICD-10-CM | POA: Diagnosis not present

## 2021-04-26 DIAGNOSIS — R293 Abnormal posture: Secondary | ICD-10-CM | POA: Diagnosis not present

## 2021-04-26 DIAGNOSIS — G9340 Encephalopathy, unspecified: Secondary | ICD-10-CM | POA: Diagnosis not present

## 2021-04-26 DIAGNOSIS — M6281 Muscle weakness (generalized): Secondary | ICD-10-CM | POA: Diagnosis not present

## 2021-04-28 DIAGNOSIS — R293 Abnormal posture: Secondary | ICD-10-CM | POA: Diagnosis not present

## 2021-04-28 DIAGNOSIS — M6281 Muscle weakness (generalized): Secondary | ICD-10-CM | POA: Diagnosis not present

## 2021-04-28 DIAGNOSIS — G9340 Encephalopathy, unspecified: Secondary | ICD-10-CM | POA: Diagnosis not present

## 2021-04-30 DIAGNOSIS — I480 Paroxysmal atrial fibrillation: Secondary | ICD-10-CM | POA: Diagnosis not present

## 2021-04-30 DIAGNOSIS — I10 Essential (primary) hypertension: Secondary | ICD-10-CM | POA: Diagnosis not present

## 2021-04-30 DIAGNOSIS — E785 Hyperlipidemia, unspecified: Secondary | ICD-10-CM | POA: Diagnosis not present

## 2021-04-30 DIAGNOSIS — E039 Hypothyroidism, unspecified: Secondary | ICD-10-CM | POA: Diagnosis not present

## 2021-05-01 DIAGNOSIS — F0390 Unspecified dementia without behavioral disturbance: Secondary | ICD-10-CM | POA: Diagnosis not present

## 2021-05-01 DIAGNOSIS — N183 Chronic kidney disease, stage 3 unspecified: Secondary | ICD-10-CM | POA: Diagnosis not present

## 2021-05-01 DIAGNOSIS — D631 Anemia in chronic kidney disease: Secondary | ICD-10-CM | POA: Diagnosis not present

## 2021-05-01 DIAGNOSIS — I69318 Other symptoms and signs involving cognitive functions following cerebral infarction: Secondary | ICD-10-CM | POA: Diagnosis not present

## 2021-05-01 DIAGNOSIS — E785 Hyperlipidemia, unspecified: Secondary | ICD-10-CM | POA: Diagnosis not present

## 2021-05-01 DIAGNOSIS — I129 Hypertensive chronic kidney disease with stage 1 through stage 4 chronic kidney disease, or unspecified chronic kidney disease: Secondary | ICD-10-CM | POA: Diagnosis not present

## 2021-05-02 DIAGNOSIS — I129 Hypertensive chronic kidney disease with stage 1 through stage 4 chronic kidney disease, or unspecified chronic kidney disease: Secondary | ICD-10-CM | POA: Diagnosis not present

## 2021-05-02 DIAGNOSIS — E559 Vitamin D deficiency, unspecified: Secondary | ICD-10-CM | POA: Diagnosis not present

## 2021-05-02 DIAGNOSIS — Z8709 Personal history of other diseases of the respiratory system: Secondary | ICD-10-CM | POA: Diagnosis not present

## 2021-05-02 DIAGNOSIS — E039 Hypothyroidism, unspecified: Secondary | ICD-10-CM | POA: Diagnosis not present

## 2021-05-02 DIAGNOSIS — Z853 Personal history of malignant neoplasm of breast: Secondary | ICD-10-CM | POA: Diagnosis not present

## 2021-05-02 DIAGNOSIS — E785 Hyperlipidemia, unspecified: Secondary | ICD-10-CM | POA: Diagnosis not present

## 2021-05-02 DIAGNOSIS — I69318 Other symptoms and signs involving cognitive functions following cerebral infarction: Secondary | ICD-10-CM | POA: Diagnosis not present

## 2021-05-02 DIAGNOSIS — Z9181 History of falling: Secondary | ICD-10-CM | POA: Diagnosis not present

## 2021-05-02 DIAGNOSIS — D631 Anemia in chronic kidney disease: Secondary | ICD-10-CM | POA: Diagnosis not present

## 2021-05-02 DIAGNOSIS — F0390 Unspecified dementia without behavioral disturbance: Secondary | ICD-10-CM | POA: Diagnosis not present

## 2021-05-02 DIAGNOSIS — I259 Chronic ischemic heart disease, unspecified: Secondary | ICD-10-CM | POA: Diagnosis not present

## 2021-05-02 DIAGNOSIS — N183 Chronic kidney disease, stage 3 unspecified: Secondary | ICD-10-CM | POA: Diagnosis not present

## 2021-05-02 DIAGNOSIS — I480 Paroxysmal atrial fibrillation: Secondary | ICD-10-CM | POA: Diagnosis not present

## 2021-05-02 DIAGNOSIS — I2510 Atherosclerotic heart disease of native coronary artery without angina pectoris: Secondary | ICD-10-CM | POA: Diagnosis not present

## 2021-05-02 DIAGNOSIS — H04129 Dry eye syndrome of unspecified lacrimal gland: Secondary | ICD-10-CM | POA: Diagnosis not present

## 2021-05-02 DIAGNOSIS — R001 Bradycardia, unspecified: Secondary | ICD-10-CM | POA: Diagnosis not present

## 2021-05-02 DIAGNOSIS — S0093XD Contusion of unspecified part of head, subsequent encounter: Secondary | ICD-10-CM | POA: Diagnosis not present

## 2021-05-08 DIAGNOSIS — F0390 Unspecified dementia without behavioral disturbance: Secondary | ICD-10-CM | POA: Diagnosis not present

## 2021-05-08 DIAGNOSIS — I69318 Other symptoms and signs involving cognitive functions following cerebral infarction: Secondary | ICD-10-CM | POA: Diagnosis not present

## 2021-05-08 DIAGNOSIS — I129 Hypertensive chronic kidney disease with stage 1 through stage 4 chronic kidney disease, or unspecified chronic kidney disease: Secondary | ICD-10-CM | POA: Diagnosis not present

## 2021-05-08 DIAGNOSIS — E785 Hyperlipidemia, unspecified: Secondary | ICD-10-CM | POA: Diagnosis not present

## 2021-05-08 DIAGNOSIS — N183 Chronic kidney disease, stage 3 unspecified: Secondary | ICD-10-CM | POA: Diagnosis not present

## 2021-05-08 DIAGNOSIS — D631 Anemia in chronic kidney disease: Secondary | ICD-10-CM | POA: Diagnosis not present

## 2021-05-13 DIAGNOSIS — N183 Chronic kidney disease, stage 3 unspecified: Secondary | ICD-10-CM | POA: Diagnosis not present

## 2021-05-13 DIAGNOSIS — D631 Anemia in chronic kidney disease: Secondary | ICD-10-CM | POA: Diagnosis not present

## 2021-05-13 DIAGNOSIS — F0390 Unspecified dementia without behavioral disturbance: Secondary | ICD-10-CM | POA: Diagnosis not present

## 2021-05-13 DIAGNOSIS — I69318 Other symptoms and signs involving cognitive functions following cerebral infarction: Secondary | ICD-10-CM | POA: Diagnosis not present

## 2021-05-13 DIAGNOSIS — I129 Hypertensive chronic kidney disease with stage 1 through stage 4 chronic kidney disease, or unspecified chronic kidney disease: Secondary | ICD-10-CM | POA: Diagnosis not present

## 2021-05-13 DIAGNOSIS — E785 Hyperlipidemia, unspecified: Secondary | ICD-10-CM | POA: Diagnosis not present

## 2021-05-14 DIAGNOSIS — D631 Anemia in chronic kidney disease: Secondary | ICD-10-CM | POA: Diagnosis not present

## 2021-05-14 DIAGNOSIS — I69318 Other symptoms and signs involving cognitive functions following cerebral infarction: Secondary | ICD-10-CM | POA: Diagnosis not present

## 2021-05-14 DIAGNOSIS — I129 Hypertensive chronic kidney disease with stage 1 through stage 4 chronic kidney disease, or unspecified chronic kidney disease: Secondary | ICD-10-CM | POA: Diagnosis not present

## 2021-05-14 DIAGNOSIS — E785 Hyperlipidemia, unspecified: Secondary | ICD-10-CM | POA: Diagnosis not present

## 2021-05-14 DIAGNOSIS — N183 Chronic kidney disease, stage 3 unspecified: Secondary | ICD-10-CM | POA: Diagnosis not present

## 2021-05-14 DIAGNOSIS — F0390 Unspecified dementia without behavioral disturbance: Secondary | ICD-10-CM | POA: Diagnosis not present

## 2021-05-20 DIAGNOSIS — I129 Hypertensive chronic kidney disease with stage 1 through stage 4 chronic kidney disease, or unspecified chronic kidney disease: Secondary | ICD-10-CM | POA: Diagnosis not present

## 2021-05-20 DIAGNOSIS — E785 Hyperlipidemia, unspecified: Secondary | ICD-10-CM | POA: Diagnosis not present

## 2021-05-20 DIAGNOSIS — N183 Chronic kidney disease, stage 3 unspecified: Secondary | ICD-10-CM | POA: Diagnosis not present

## 2021-05-20 DIAGNOSIS — D631 Anemia in chronic kidney disease: Secondary | ICD-10-CM | POA: Diagnosis not present

## 2021-05-20 DIAGNOSIS — F0390 Unspecified dementia without behavioral disturbance: Secondary | ICD-10-CM | POA: Diagnosis not present

## 2021-05-20 DIAGNOSIS — I69318 Other symptoms and signs involving cognitive functions following cerebral infarction: Secondary | ICD-10-CM | POA: Diagnosis not present

## 2021-05-28 DIAGNOSIS — E0590 Thyrotoxicosis, unspecified without thyrotoxic crisis or storm: Secondary | ICD-10-CM | POA: Diagnosis not present

## 2021-05-30 DIAGNOSIS — I129 Hypertensive chronic kidney disease with stage 1 through stage 4 chronic kidney disease, or unspecified chronic kidney disease: Secondary | ICD-10-CM | POA: Diagnosis not present

## 2021-05-30 DIAGNOSIS — F0390 Unspecified dementia without behavioral disturbance: Secondary | ICD-10-CM | POA: Diagnosis not present

## 2021-05-30 DIAGNOSIS — D631 Anemia in chronic kidney disease: Secondary | ICD-10-CM | POA: Diagnosis not present

## 2021-05-30 DIAGNOSIS — N183 Chronic kidney disease, stage 3 unspecified: Secondary | ICD-10-CM | POA: Diagnosis not present

## 2021-05-30 DIAGNOSIS — I69318 Other symptoms and signs involving cognitive functions following cerebral infarction: Secondary | ICD-10-CM | POA: Diagnosis not present

## 2021-05-30 DIAGNOSIS — E785 Hyperlipidemia, unspecified: Secondary | ICD-10-CM | POA: Diagnosis not present

## 2021-06-02 DIAGNOSIS — E785 Hyperlipidemia, unspecified: Secondary | ICD-10-CM | POA: Diagnosis not present

## 2021-06-02 DIAGNOSIS — D631 Anemia in chronic kidney disease: Secondary | ICD-10-CM | POA: Diagnosis not present

## 2021-06-02 DIAGNOSIS — F0390 Unspecified dementia without behavioral disturbance: Secondary | ICD-10-CM | POA: Diagnosis not present

## 2021-06-02 DIAGNOSIS — I129 Hypertensive chronic kidney disease with stage 1 through stage 4 chronic kidney disease, or unspecified chronic kidney disease: Secondary | ICD-10-CM | POA: Diagnosis not present

## 2021-06-02 DIAGNOSIS — I69318 Other symptoms and signs involving cognitive functions following cerebral infarction: Secondary | ICD-10-CM | POA: Diagnosis not present

## 2021-06-02 DIAGNOSIS — R001 Bradycardia, unspecified: Secondary | ICD-10-CM | POA: Diagnosis not present

## 2021-06-02 DIAGNOSIS — S0093XD Contusion of unspecified part of head, subsequent encounter: Secondary | ICD-10-CM | POA: Diagnosis not present

## 2021-06-02 DIAGNOSIS — I259 Chronic ischemic heart disease, unspecified: Secondary | ICD-10-CM | POA: Diagnosis not present

## 2021-06-02 DIAGNOSIS — E559 Vitamin D deficiency, unspecified: Secondary | ICD-10-CM | POA: Diagnosis not present

## 2021-06-02 DIAGNOSIS — Z853 Personal history of malignant neoplasm of breast: Secondary | ICD-10-CM | POA: Diagnosis not present

## 2021-06-02 DIAGNOSIS — H04129 Dry eye syndrome of unspecified lacrimal gland: Secondary | ICD-10-CM | POA: Diagnosis not present

## 2021-06-02 DIAGNOSIS — Z8709 Personal history of other diseases of the respiratory system: Secondary | ICD-10-CM | POA: Diagnosis not present

## 2021-06-02 DIAGNOSIS — E039 Hypothyroidism, unspecified: Secondary | ICD-10-CM | POA: Diagnosis not present

## 2021-06-02 DIAGNOSIS — I480 Paroxysmal atrial fibrillation: Secondary | ICD-10-CM | POA: Diagnosis not present

## 2021-06-02 DIAGNOSIS — Z9181 History of falling: Secondary | ICD-10-CM | POA: Diagnosis not present

## 2021-06-02 DIAGNOSIS — N183 Chronic kidney disease, stage 3 unspecified: Secondary | ICD-10-CM | POA: Diagnosis not present

## 2021-06-02 DIAGNOSIS — I2510 Atherosclerotic heart disease of native coronary artery without angina pectoris: Secondary | ICD-10-CM | POA: Diagnosis not present

## 2021-06-03 DIAGNOSIS — I129 Hypertensive chronic kidney disease with stage 1 through stage 4 chronic kidney disease, or unspecified chronic kidney disease: Secondary | ICD-10-CM | POA: Diagnosis not present

## 2021-06-03 DIAGNOSIS — D631 Anemia in chronic kidney disease: Secondary | ICD-10-CM | POA: Diagnosis not present

## 2021-06-03 DIAGNOSIS — I69318 Other symptoms and signs involving cognitive functions following cerebral infarction: Secondary | ICD-10-CM | POA: Diagnosis not present

## 2021-06-03 DIAGNOSIS — F0390 Unspecified dementia without behavioral disturbance: Secondary | ICD-10-CM | POA: Diagnosis not present

## 2021-06-03 DIAGNOSIS — E785 Hyperlipidemia, unspecified: Secondary | ICD-10-CM | POA: Diagnosis not present

## 2021-06-03 DIAGNOSIS — N183 Chronic kidney disease, stage 3 unspecified: Secondary | ICD-10-CM | POA: Diagnosis not present

## 2021-06-11 DIAGNOSIS — I10 Essential (primary) hypertension: Secondary | ICD-10-CM | POA: Diagnosis not present

## 2021-06-11 DIAGNOSIS — I480 Paroxysmal atrial fibrillation: Secondary | ICD-10-CM | POA: Diagnosis not present

## 2021-06-11 DIAGNOSIS — E039 Hypothyroidism, unspecified: Secondary | ICD-10-CM | POA: Diagnosis not present

## 2021-06-11 DIAGNOSIS — E785 Hyperlipidemia, unspecified: Secondary | ICD-10-CM | POA: Diagnosis not present

## 2021-06-12 DIAGNOSIS — I69318 Other symptoms and signs involving cognitive functions following cerebral infarction: Secondary | ICD-10-CM | POA: Diagnosis not present

## 2021-06-12 DIAGNOSIS — N183 Chronic kidney disease, stage 3 unspecified: Secondary | ICD-10-CM | POA: Diagnosis not present

## 2021-06-12 DIAGNOSIS — E785 Hyperlipidemia, unspecified: Secondary | ICD-10-CM | POA: Diagnosis not present

## 2021-06-12 DIAGNOSIS — I129 Hypertensive chronic kidney disease with stage 1 through stage 4 chronic kidney disease, or unspecified chronic kidney disease: Secondary | ICD-10-CM | POA: Diagnosis not present

## 2021-06-12 DIAGNOSIS — D631 Anemia in chronic kidney disease: Secondary | ICD-10-CM | POA: Diagnosis not present

## 2021-06-12 DIAGNOSIS — F0390 Unspecified dementia without behavioral disturbance: Secondary | ICD-10-CM | POA: Diagnosis not present

## 2021-06-19 DIAGNOSIS — D631 Anemia in chronic kidney disease: Secondary | ICD-10-CM | POA: Diagnosis not present

## 2021-06-19 DIAGNOSIS — N183 Chronic kidney disease, stage 3 unspecified: Secondary | ICD-10-CM | POA: Diagnosis not present

## 2021-06-19 DIAGNOSIS — I129 Hypertensive chronic kidney disease with stage 1 through stage 4 chronic kidney disease, or unspecified chronic kidney disease: Secondary | ICD-10-CM | POA: Diagnosis not present

## 2021-06-19 DIAGNOSIS — I69318 Other symptoms and signs involving cognitive functions following cerebral infarction: Secondary | ICD-10-CM | POA: Diagnosis not present

## 2021-06-19 DIAGNOSIS — E785 Hyperlipidemia, unspecified: Secondary | ICD-10-CM | POA: Diagnosis not present

## 2021-06-19 DIAGNOSIS — F0390 Unspecified dementia without behavioral disturbance: Secondary | ICD-10-CM | POA: Diagnosis not present

## 2021-06-20 DIAGNOSIS — I129 Hypertensive chronic kidney disease with stage 1 through stage 4 chronic kidney disease, or unspecified chronic kidney disease: Secondary | ICD-10-CM | POA: Diagnosis not present

## 2021-06-20 DIAGNOSIS — I69318 Other symptoms and signs involving cognitive functions following cerebral infarction: Secondary | ICD-10-CM | POA: Diagnosis not present

## 2021-06-20 DIAGNOSIS — F0390 Unspecified dementia without behavioral disturbance: Secondary | ICD-10-CM | POA: Diagnosis not present

## 2021-06-20 DIAGNOSIS — E785 Hyperlipidemia, unspecified: Secondary | ICD-10-CM | POA: Diagnosis not present

## 2021-06-20 DIAGNOSIS — D631 Anemia in chronic kidney disease: Secondary | ICD-10-CM | POA: Diagnosis not present

## 2021-06-20 DIAGNOSIS — N183 Chronic kidney disease, stage 3 unspecified: Secondary | ICD-10-CM | POA: Diagnosis not present

## 2021-07-03 DEATH — deceased
